# Patient Record
Sex: Male | Born: 1950 | Race: Black or African American | Hispanic: No | Smoking: Current some day smoker
Health system: Southern US, Community
[De-identification: ages and names within clinical notes are randomized; demographics above are authoritative.]

## PROBLEM LIST (undated history)

## (undated) DIAGNOSIS — I10 Essential (primary) hypertension: Secondary | ICD-10-CM

## (undated) DIAGNOSIS — I214 Non-ST elevation (NSTEMI) myocardial infarction: Secondary | ICD-10-CM

## (undated) DIAGNOSIS — E785 Hyperlipidemia, unspecified: Secondary | ICD-10-CM

## (undated) DIAGNOSIS — J449 Chronic obstructive pulmonary disease, unspecified: Secondary | ICD-10-CM

## (undated) HISTORY — DX: Essential (primary) hypertension: I10

---

## 2018-03-17 ENCOUNTER — Inpatient Hospital Stay
Admission: EM | Admit: 2018-03-17 | Discharge: 2018-03-21 | DRG: 291 | Disposition: A | Discharge status: Home / Self Care | Payer: Medicare Other | Attending: Family Medicine | Admitting: Family Medicine

## 2018-03-17 ENCOUNTER — Observation Stay: Payer: Medicare Other

## 2018-03-17 ENCOUNTER — Other Ambulatory Visit (INDEPENDENT_AMBULATORY_CARE_PROVIDER_SITE_OTHER): Payer: Self-pay

## 2018-03-17 ENCOUNTER — Emergency Department: Payer: Medicare Other

## 2018-03-17 DIAGNOSIS — I509 Heart failure, unspecified: Secondary | ICD-10-CM

## 2018-03-17 DIAGNOSIS — I472 Ventricular tachycardia: Secondary | ICD-10-CM | POA: Diagnosis present

## 2018-03-17 DIAGNOSIS — F1010 Alcohol abuse, uncomplicated: Secondary | ICD-10-CM | POA: Diagnosis present

## 2018-03-17 DIAGNOSIS — I10 Essential (primary) hypertension: Secondary | ICD-10-CM | POA: Diagnosis present

## 2018-03-17 DIAGNOSIS — M7989 Other specified soft tissue disorders: Secondary | ICD-10-CM | POA: Diagnosis present

## 2018-03-17 DIAGNOSIS — Z8249 Family history of ischemic heart disease and other diseases of the circulatory system: Secondary | ICD-10-CM

## 2018-03-17 DIAGNOSIS — F17210 Nicotine dependence, cigarettes, uncomplicated: Secondary | ICD-10-CM | POA: Diagnosis present

## 2018-03-17 DIAGNOSIS — R9439 Abnormal result of other cardiovascular function study: Secondary | ICD-10-CM | POA: Diagnosis present

## 2018-03-17 DIAGNOSIS — N189 Chronic kidney disease, unspecified: Secondary | ICD-10-CM | POA: Diagnosis present

## 2018-03-17 DIAGNOSIS — I272 Pulmonary hypertension, unspecified: Secondary | ICD-10-CM | POA: Diagnosis present

## 2018-03-17 DIAGNOSIS — I4581 Long QT syndrome: Secondary | ICD-10-CM | POA: Diagnosis present

## 2018-03-17 DIAGNOSIS — I130 Hypertensive heart and chronic kidney disease with heart failure and stage 1 through stage 4 chronic kidney disease, or unspecified chronic kidney disease: Principal | ICD-10-CM | POA: Diagnosis present

## 2018-03-17 DIAGNOSIS — D696 Thrombocytopenia, unspecified: Secondary | ICD-10-CM | POA: Diagnosis present

## 2018-03-17 DIAGNOSIS — I083 Combined rheumatic disorders of mitral, aortic and tricuspid valves: Secondary | ICD-10-CM | POA: Diagnosis present

## 2018-03-17 DIAGNOSIS — I420 Dilated cardiomyopathy: Secondary | ICD-10-CM | POA: Diagnosis present

## 2018-03-17 DIAGNOSIS — I5041 Acute combined systolic (congestive) and diastolic (congestive) heart failure: Secondary | ICD-10-CM | POA: Diagnosis present

## 2018-03-17 DIAGNOSIS — I4729 Other ventricular tachycardia: Secondary | ICD-10-CM | POA: Diagnosis present

## 2018-03-17 DIAGNOSIS — R778 Other specified abnormalities of plasma proteins: Secondary | ICD-10-CM

## 2018-03-17 DIAGNOSIS — N179 Acute kidney failure, unspecified: Secondary | ICD-10-CM | POA: Diagnosis present

## 2018-03-17 DIAGNOSIS — N289 Disorder of kidney and ureter, unspecified: Secondary | ICD-10-CM

## 2018-03-17 LAB — CBC AND DIFFERENTIAL
Absolute NRBC: 0 10*3/uL (ref 0.00–0.00)
Basophils Absolute Automated: 0.03 10*3/uL (ref 0.00–0.08)
Basophils Automated: 0.6 %
Eosinophils Absolute Automated: 0.01 10*3/uL (ref 0.00–0.44)
Eosinophils Automated: 0.2 %
Hematocrit: 49.1 % (ref 37.6–49.6)
Hgb: 15.5 g/dL (ref 12.5–17.1)
Immature Granulocytes Absolute: 0.01 10*3/uL (ref 0.00–0.07)
Immature Granulocytes: 0.2 %
Lymphocytes Absolute Automated: 1.69 10*3/uL (ref 0.42–3.22)
Lymphocytes Automated: 36.2 %
MCH: 29.4 pg (ref 25.1–33.5)
MCHC: 31.6 g/dL (ref 31.5–35.8)
MCV: 93.2 fL (ref 78.0–96.0)
MPV: 14 fL — ABNORMAL HIGH (ref 8.9–12.5)
Monocytes Absolute Automated: 0.28 10*3/uL (ref 0.21–0.85)
Monocytes: 6 %
Neutrophils Absolute: 2.65 10*3/uL (ref 1.10–6.33)
Neutrophils: 56.8 %
Nucleated RBC: 0 /100 WBC (ref 0.0–0.0)
Platelets: 112 10*3/uL — ABNORMAL LOW (ref 142–346)
RBC: 5.27 10*6/uL (ref 4.20–5.90)
RDW: 14 % (ref 11–15)
WBC: 4.67 10*3/uL (ref 3.10–9.50)

## 2018-03-17 LAB — BLOOD GAS, ARTERIAL
Arterial Total CO2: 34.3 mEq/L — ABNORMAL HIGH (ref 24.0–30.0)
Base Excess, Arterial: -5.6 mEq/L — ABNORMAL LOW (ref <=2.0)
HCO3, Arterial: 17.3 mEq/L — ABNORMAL LOW (ref 23.0–29.0)
O2 Flow: 2 L/min
O2 Sat, Arterial: 98.2 % (ref 95.0–100.0)
Temperature: 37
pCO2, Arterial: 27.2 mmHg — ABNORMAL LOW (ref 35.0–45.0)
pH, Arterial: 7.413 (ref 7.350–7.450)
pO2, Arterial: 111 mmHg — ABNORMAL HIGH (ref 80.0–90.0)

## 2018-03-17 LAB — TROPONIN I
Troponin I: 0.14 ng/mL — ABNORMAL HIGH (ref 0.00–0.09)
Troponin I: 0.15 ng/mL — ABNORMAL HIGH (ref 0.00–0.09)

## 2018-03-17 LAB — COMPREHENSIVE METABOLIC PANEL
ALT: 39 U/L (ref 0–55)
AST (SGOT): 68 U/L — ABNORMAL HIGH (ref 5–34)
Albumin/Globulin Ratio: 1 (ref 0.9–2.2)
Albumin: 3.9 g/dL (ref 3.5–5.0)
Alkaline Phosphatase: 65 U/L (ref 38–106)
Anion Gap: 14 (ref 5.0–15.0)
BUN: 31 mg/dL — ABNORMAL HIGH (ref 9.0–28.0)
Bilirubin, Total: 2.1 mg/dL — ABNORMAL HIGH (ref 0.2–1.2)
CO2: 18 mEq/L — ABNORMAL LOW (ref 22–29)
Calcium: 9.7 mg/dL (ref 8.5–10.5)
Chloride: 107 mEq/L (ref 100–111)
Creatinine: 2.1 mg/dL — ABNORMAL HIGH (ref 0.7–1.3)
Globulin: 4.1 g/dL — ABNORMAL HIGH (ref 2.0–3.6)
Glucose: 110 mg/dL — ABNORMAL HIGH (ref 70–100)
Potassium: 4.6 mEq/L (ref 3.5–5.1)
Protein, Total: 8 g/dL (ref 6.0–8.3)
Sodium: 139 mEq/L (ref 136–145)

## 2018-03-17 LAB — HEMOLYSIS INDEX: Hemolysis Index: 16 (ref 0–18)

## 2018-03-17 LAB — BILIRUBIN, DIRECT: Bilirubin Direct: 1.5 mg/dL — ABNORMAL HIGH (ref 0.0–0.5)

## 2018-03-17 LAB — B-TYPE NATRIURETIC PEPTIDE: B-Natriuretic Peptide: 7289 pg/mL — ABNORMAL HIGH (ref 0–100)

## 2018-03-17 LAB — GFR: EGFR: 38.3

## 2018-03-17 MED ORDER — FUROSEMIDE 10 MG/ML IJ SOLN
40.00 mg | Freq: Once | INTRAMUSCULAR | Status: AC
Start: 2018-03-17 — End: 2018-03-17
  Administered 2018-03-17: 20:00:00 40 mg via INTRAVENOUS
  Filled 2018-03-17: qty 4

## 2018-03-17 MED ORDER — FUROSEMIDE 10 MG/ML IJ SOLN
40.00 mg | Freq: Once | INTRAMUSCULAR | Status: DC
Start: 2018-03-17 — End: 2018-03-17

## 2018-03-17 MED ORDER — TAB-A-VITE/BETA CAROTENE PO TABS
1.00 | ORAL_TABLET | Freq: Every day | ORAL | Status: DC
Start: 2018-03-17 — End: 2018-03-21
  Administered 2018-03-17 – 2018-03-21 (×5): 1 via ORAL
  Filled 2018-03-17 (×5): qty 1

## 2018-03-17 MED ORDER — FUROSEMIDE 10 MG/ML IJ SOLN
40.00 mg | Freq: Two times a day (BID) | INTRAMUSCULAR | Status: DC
Start: 2018-03-18 — End: 2018-03-21
  Administered 2018-03-18 – 2018-03-21 (×7): 40 mg via INTRAVENOUS
  Filled 2018-03-17 (×7): qty 4

## 2018-03-17 MED ORDER — THIAMINE (VITAMIN B1) 100 MG PO TABS (WRAP)
100.00 mg | ORAL_TABLET | Freq: Every day | ORAL | Status: DC
Start: 2018-03-17 — End: 2018-03-21
  Administered 2018-03-17 – 2018-03-21 (×5): 100 mg via ORAL
  Filled 2018-03-17 (×5): qty 1

## 2018-03-17 MED ORDER — ONDANSETRON HCL 4 MG/2ML IJ SOLN
4.00 mg | Freq: Once | INTRAMUSCULAR | Status: DC | PRN
Start: 2018-03-17 — End: 2018-03-17

## 2018-03-17 MED ORDER — FOLIC ACID 1 MG PO TABS
1.00 mg | ORAL_TABLET | Freq: Every day | ORAL | Status: DC
Start: 2018-03-17 — End: 2018-03-21
  Administered 2018-03-17 – 2018-03-21 (×5): 1 mg via ORAL
  Filled 2018-03-17 (×5): qty 1

## 2018-03-17 MED ORDER — ASPIRIN 325 MG PO TABS
325.00 mg | ORAL_TABLET | Freq: Once | ORAL | Status: AC
Start: 2018-03-17 — End: 2018-03-17
  Administered 2018-03-17: 19:00:00 325 mg via ORAL
  Filled 2018-03-17: qty 1

## 2018-03-17 MED ORDER — HEPARIN SODIUM (PORCINE) 5000 UNIT/ML IJ SOLN
5000.00 [IU] | Freq: Two times a day (BID) | INTRAMUSCULAR | Status: DC
Start: 2018-03-17 — End: 2018-03-21
  Administered 2018-03-17 – 2018-03-21 (×7): 5000 [IU] via SUBCUTANEOUS
  Filled 2018-03-17 (×7): qty 1

## 2018-03-17 MED ORDER — ACETAMINOPHEN 325 MG PO TABS
650.00 mg | ORAL_TABLET | Freq: Once | ORAL | Status: DC | PRN
Start: 2018-03-17 — End: 2018-03-21

## 2018-03-17 NOTE — H&P (Signed)
[facility]      Patient: Chad Rubio  Date: 03/17/2018   DOB: July 15, 1951  Admission Date: 03/17/2018   MRN: [id number]  Attending: Marya Amsler         Chief Complaint   Patient presents with   . Shortness of Breath      History Gathered From: Patient and ED physician.     HISTORY AND PHYSICAL     ABB GOBERT is a 67 y.o. male with medical history of hypertension, former smoker (quit 4 months ago), alcohol abuse (drinks 1 pint on weekend) who presented with shortness of breath.      Patient states that he is having shortness of breath since June, worsened overtime, now on exertion as well as on rest, unable to lay down flat, associated with cough, generalized fatigue,  inability to perform daily activities and left leg swelling.  He denies chest pain, palpitation, cold/flulike symptoms, nausea, vomiting, change in urinary/bowel habit.    Past Medical History:   Diagnosis Date   . Hypertension      Past surgical history:  Patient denies significant past surgical history.    Prior to Admission medications    Medication Sig Start Date End Date Taking? Authorizing Provider   lisinopril (PRINIVIL,ZESTRIL) 20 MG tablet Take 20 mg by mouth 03/05/13  Yes [provider]       No Known Allergies    CODE STATUS: Full.  I discussed code status with the patient.  He wants to be full code.    PRIMARY CARE MD: Pcp, None, MD    Family history:  Grandmother: Congestive heart failure.  Patient denies family history of coronary artery disease.    Social History   Substance Use Topics   . Smoking status: Former Smoker-Quit 4 months ago.     . Smokeless tobacco: Never Used   . Alcohol use 1 pint on weekends.         REVIEW OF SYSTEMS   Positive for: As in HPI.  Negative for: As in HPI.  All ROS completed and otherwise negative.    PHYSICAL EXAM     Vital Signs (most recent): BP (!) 148/104   Pulse 94   Temp 97 F (36.1 C)   Resp 22   Ht 1.905 m (6\' 3" )   Wt 80.7 kg (178 lb)   SpO2 95%   BMI 22.25 kg/m    Constitutional: NAD. Patient speaks freely in full sentences.   HEENT: NC/AT, no scleral icterus or conjunctival pallor, no nasal discharge, MMM.  Neck: trachea midline, supple.  Cardiovascular: RRR, normal S1 S2, no murmurs, gallops or palpable thrills. + JVD. + LLE swelling.  Respiratory: Normal rate. No retractions or increased work of breathing. Crackles b/l. No wheezing.   Gastrointestinal: +BS, non-distended, soft, non-tender.  Genitourinary: no suprapubic tenderness.  Musculoskeletal: ROM and motor strength grossly normal.   Skin exam:  Normal.  Neurologic: No gross motor or sensory deficits  Psychiatric: AAOx3, affect and mood appropriate. The patient is alert, interactive, appropriate.  Capillary refill:  Normal.    Exam done by Marya Amsler, MD on 03/17/18 at 7:45 PM      LABS & IMAGING     Recent Results (from the past 24 hour(s))   CBC with differential    Collection Time: 03/17/18  5:00 PM   Result Value Ref Range    WBC 4.67 3.10 - 9.50 x10 3/uL    Hgb 15.5 12.5 - 17.1 g/dL  Hematocrit 49.1 37.6 - 49.6 %    Platelets 112 (L) 142 - 346 x10 3/uL    RBC 5.27 4.20 - 5.90 x10 6/uL    MCV 93.2 78.0 - 96.0 fL    MCH 29.4 25.1 - 33.5 pg    MCHC 31.6 31.5 - 35.8 g/dL    RDW 14 11 - 15 %    MPV 14.0 (H) 8.9 - 12.5 fL    Neutrophils 56.8 None %    Lymphocytes Automated 36.2 None %    Monocytes 6.0 None %    Eosinophils Automated 0.2 None %    Basophils Automated 0.6 None %    Immature Granulocyte 0.2 None %    Nucleated RBC 0.0 0.0 - 0.0 /100 WBC    Neutrophils Absolute 2.65 1.10 - 6.33 x10 3/uL    Abs Lymph Automated 1.69 0.42 - 3.22 x10 3/uL    Abs Mono Automated 0.28 0.21 - 0.85 x10 3/uL    Abs Eos Automated 0.01 0.00 - 0.44 x10 3/uL    Absolute Baso Automated 0.03 0.00 - 0.08 x10 3/uL    Absolute Immature Granulocyte 0.01 0.00 - 0.07 x10 3/uL    Absolute NRBC 0.00 0.00 - 0.00 x10 3/uL   Comprehensive metabolic panel    Collection Time: 03/17/18  5:00 PM   Result Value Ref Range    Glucose 110 (H) 70 - 100  mg/dL    BUN 65.7 (H) 9.0 - 84.6 mg/dL    Creatinine 2.1 (H) 0.7 - 1.3 mg/dL    Sodium 962 952 - 841 mEq/L    Potassium 4.6 3.5 - 5.1 mEq/L    Chloride 107 100 - 111 mEq/L    CO2 18 (L) 22 - 29 mEq/L    Calcium 9.7 8.5 - 10.5 mg/dL    Protein, Total 8.0 6.0 - 8.3 g/dL    Albumin 3.9 3.5 - 5.0 g/dL    AST (SGOT) 68 (H) 5 - 34 U/L    ALT 39 0 - 55 U/L    Alkaline Phosphatase 65 38 - 106 U/L    Bilirubin, Total 2.1 (H) 0.2 - 1.2 mg/dL    Globulin 4.1 (H) 2.0 - 3.6 g/dL    Albumin/Globulin Ratio 1.0 0.9 - 2.2    Anion Gap 14.0 5.0 - 15.0   Troponin I    Collection Time: 03/17/18  5:00 PM   Result Value Ref Range    Troponin I 0.14 (HH) 0.00 - 0.09 ng/mL   Hemolysis index    Collection Time: 03/17/18  5:00 PM   Result Value Ref Range    Hemolysis Index 16 0 - 18   GFR    Collection Time: 03/17/18  5:00 PM   Result Value Ref Range    EGFR 38.3    B-type Natriuretic Peptide    Collection Time: 03/17/18  5:00 PM   Result Value Ref Range    B-Natriuretic Peptide 7,289 (H) 0 - 100 pg/mL   Bilirubin, direct    Collection Time: 03/17/18  5:00 PM   Result Value Ref Range    Bilirubin, Direct 1.5 (H) 0.0 - 0.5 mg/dL   Arterial Blood Gas (ABG)    Collection Time: 03/17/18  5:20 PM   Result Value Ref Range    pH, Arterial 7.413 7.350 - 7.450    pCO2, Arterial 27.2 (L) 35.0 - 45.0 mmHg    pO2, Arterial 111.0 (H) 80.0 - 90.0 mmHg    HCO3, Arterial 17.3 (L) 23.0 -  29.0 mEq/L    Arterial Total CO2 34.3 (H) 24.0 - 30.0 mEq/L    Base Excess, Arterial -5.6 (L) -2.0 - 2.0 mEq/L    O2 Sat, Arterial 98.2 95.0 - 100.0 %    ABG CollectionSite Left Radl     Allen's Test Yes     Temperature 37.0     FIO2 na %    O2 Delivery Nasal Cannula     O2 Flow 2.0 L/min       MICROBIOLOGY:  Blood Culture: NA  Urine Culture: NA  Antibiotics Started: N    IMAGING:  Upon my review:   1.  Chest x-ray showing cardiomegaly with bilateral pleural effusion.    CARDIAC:  EKG Interpretation (upon my review):  Normal sinus rhythm.  T-wave inversion in lateral leads.   Prolonged QTC.    Markers:    Recent Labs  Lab 03/17/18  1700   Troponin I 0.14*       EMERGENCY DEPARTMENT COURSE:  Orders Placed This Encounter   Procedures   . Chest AP Portable   . [place] VenoDopp Low Extremity Bilateral   . CBC with differential   . Comprehensive metabolic panel   . Troponin I   . Hemolysis index   . GFR   . B-type Natriuretic Peptide   . Arterial Blood Gas (ABG)   . Bilirubin, direct   . Basic Metabolic Panel   . CBC   . Magnesium   . Vital signs with Pulse Ox (per unit protocol)   . Progressive Mobility Protocol   . Notify physician   . Activate Heart Failure Clinical Pathway   . Intake and Output   . Height   . Weight   . Weigh patient   . Skin assessment   . Place sequential compression device   . Maintain sequential compression device   . Telemetry 24 Hour Protocol   . Full Code   . ED Unit Sec Comm Order   . Cardiac rehab-phase I-inpatient referral   . ECG 12 Lead   . Saline lock IV   . Place (admit) for Observation Services   . [facility] ED Bed Request (Observation)       ASSESSMENT & PLAN     CASEN PRYOR is a 67 y.o. male with medical history of hypertension, former smoker (quit 4 months ago), alcohol abuse (drinks 1 pint on weekend) admitted with new onset congestive heart failure.      Patient Active Hospital Problem List: 03/17/18    1. New onset CHF: (SOB-rest and exertional, fatigue, orthopnea, JVD, + CXR, BNP 7289)  2. HTN  3. ARF: Cr 2.1 POA. Unsure baseline.  4. Abnormal EKG  5. Binge drinking: 1 pint on weekend  6. LLE swelling    -Reviewed CXR; showing cardiomegaly with bilateral pleural effusion.  -Reviewed EKG: Normal sinus rhythm.  T-wave inversion in lateral leads.  Prolonged QTC.  -ASA given in ED.  -IV Lasix with recheck electrolytes and renal functions. Strict I&Os, daily weight, water restriction, low salt diet and cardiac rehab on discharge.  -Echocardiogram.  -Consider cardiology consult.  -TSH and Mg.  -Holding Lisinopril. Nephrology consulted, will follow  recommendations. Will avoid nephrotoxins.   -Telemetry monitoring.  -Will trend troponin.   -Will avoid meds which increase QTc.   -Alcohol cessation education. Multivitamins. Last drink was on weekend.  -[place] LLE to r/o DVT    Nutrition  2gms Na diet with fluid restriction.    DVT/VTE Prophylaxis  SCD/Heparin.    Anticipated medical stability for discharge: 1-2 days.    Service status/Reason for ongoing hospitalization: Observation/CHF new onset  Anticipated Discharge Needs: To be determined.    Signed,  Marya Amsler    Time Elapsed: 45 min.

## 2018-03-17 NOTE — ED Notes (Addendum)
[facility] ED NURSING NOTE FOR THE RECEIVING INPATIENT NURSE   ED NURSE Marcelino Scot [phone]   ED CHARGE RN [phone]   ADMISSION INFORMATION   Chad Rubio is a 67 y.o. male admitted with a diagnosis of:    1. Congestive heart failure, unspecified HF chronicity, unspecified heart failure type    2. Elevated troponin    3. Renal insufficiency       Isolation None   Holding Orders confirmed? Yes   Belongings Documented? Yes   Home medications sent to pharmacy confirmed? Yes   NURSING CARE   Mental Status   alert and oriented   ADLs ADL   Independent with all ADLs    Ambulation no difficulty   Pertinent Information  and Safety Concerns Going to doppler before coming to floor.      CT / NIH   CT Head ordered on this patient?  No   NIH/Dysphagia assessment done prior to admission? No    VITAL SIGNS     Time of Last Set of Vitals:    1945 Temperature 97    BP 121/89    Heart Rate 98    Respirations 27    Pulse Ox 98   IV LINES   IV Catheter Size: 22 g L hand  Peripheral IV 03/17/18 Left Hand (Active)   Site Assessment Clean;Intact;Dry 03/17/2018  5:25 PM   Line Status Saline Locked 03/17/2018  5:25 PM   Dressing Status Clean;Dry;Intact 03/17/2018  5:25 PM   Number of days: 0          LAB RESULTS   Labs Reviewed   CBC AND DIFFERENTIAL - Abnormal; Notable for the following:        Result Value    Platelets 112 (*)     MPV 14.0 (*)     All other components within normal limits   COMPREHENSIVE METABOLIC PANEL - Abnormal; Notable for the following:     Glucose 110 (*)     BUN 31.0 (*)     Creatinine 2.1 (*)     CO2 18 (*)     AST (SGOT) 68 (*)     Bilirubin, Total 2.1 (*)     Globulin 4.1 (*)     All other components within normal limits   TROPONIN I - Abnormal; Notable for the following:     Troponin I 0.14 (*)     All other components within normal limits   B-TYPE NATRIURETIC PEPTIDE - Abnormal; Notable for the following:     B-Natriuretic Peptide 7,289 (*)     All other components within normal limits   BLOOD GAS, ARTERIAL -  Abnormal; Notable for the following:     pCO2, Arterial 27.2 (*)     pO2, Arterial 111.0 (*)     HCO3, Arterial 17.3 (*)     Arterial Total CO2 34.3 (*)     Base Excess, Arterial -5.6 (*)     All other components within normal limits   BILIRUBIN, DIRECT - Abnormal; Notable for the following:     Bilirubin, Direct 1.5 (*)     All other components within normal limits   HEMOLYSIS INDEX   GFR          (11/2017)

## 2018-03-17 NOTE — ED Triage Notes (Signed)
Chad Rubio is a 67 y.o. male c/o worsening SOB since June. Says physical,l activity makes it worse. Pt also states he has HTN but does not take medicine at hiome

## 2018-03-17 NOTE — ED Provider Notes (Signed)
EMERGENCY DEPARTMENT HISTORY AND PHYSICAL EXAM     Physician/Midlevel provider first contact with patient: 03/17/18 1659         Date: 03/17/2018  Patient Name: Chad Rubio    History of Presenting Illness     Chief Complaint   Patient presents with   . Shortness of Breath   . Cough       History Provided By: Patient    Chief Complaint: SOB  Duration: since June  Timing:  Intermittent  Location: resp/CV  Quality: uncomfortable  Severity: Severe  Exacerbating factors: worse with walking for long periods of time  Alleviating factors: none  Associated Symptoms: fatigue, SOB, weakness, leg swelling  Pertinent Negatives: none    Additional History: Chad Rubio is a 67 y.o. male presenting to the ED with intermittent "panic attacks" that he states he has had since June. When he experiences the panic attacks he feels SOB that is worse with walking for long periods of time. He reports since his panic attacks began, he has also felt fatigue and generalized weakness. Pt also c/o bilateral leg swelling. Pt has not seen a pulmonologist or cardiologist in the past; does not have a PCP. He reports he used to smoke 1 ppd, but since his symptoms began, 1 pack lasts him 2-3 days.      PCP: Pcp, None, MD  SPECIALISTS:    Current Facility-Administered Medications   Medication Dose Route Frequency Provider Last Rate Last Dose   . heparin (porcine) injection 5,000 Units  5,000 Units Subcutaneous Q12H [facility] Marya Amsler, MD         Current Outpatient Prescriptions   Medication Sig Dispense Refill   . lisinopril (PRINIVIL,ZESTRIL) 20 MG tablet Take 20 mg by mouth         Past History     Past Medical History:  Past Medical History:   Diagnosis Date   . Hypertension        Past Surgical History:  History reviewed. No pertinent surgical history.    Family History:  History reviewed. No pertinent family history.    Social History:  Social History   Substance Use Topics   . Smoking status: Former Games developer   . Smokeless tobacco: Never  Used   . Alcohol use No       Allergies:  No Known Allergies    Review of Systems     Review of Systems   Constitutional: Positive for fatigue. Negative for activity change, chills and fever.   HENT: Negative for congestion and sore throat.    Eyes: Negative for pain and redness.   Respiratory: Positive for shortness of breath. Negative for cough.    Cardiovascular: Positive for leg swelling. Negative for chest pain and palpitations.   Gastrointestinal: Negative for abdominal pain, diarrhea, nausea and vomiting.   Genitourinary: Negative for dysuria and hematuria.   Musculoskeletal: Negative for arthralgias and myalgias.   Skin: Negative for pallor and rash.   Neurological: Positive for weakness. Negative for light-headedness and headaches.   Psychiatric/Behavioral: Negative for suicidal ideas. The patient is not nervous/anxious.         Positive for panic attacks.    All other systems reviewed and are negative.    Physical Exam   BP (!) 162/110   Pulse 98   Temp 97 F (36.1 C)   Resp (!) 27   Ht 6\' 3"  (1.905 m)   Wt 80.7 kg   SpO2 98%   BMI 22.25 kg/m  Physical Exam   Constitutional: He is oriented to person, place, and time. No distress.   Thin appearing.    HENT:   Head: Normocephalic and atraumatic.   Mouth/Throat: Oropharynx is clear and moist.   Eyes: Pupils are equal, round, and reactive to light. Conjunctivae and EOM are normal.   Neck: Normal range of motion. Neck supple.   Cardiovascular: Normal rate and regular rhythm.    +JVD   Pulmonary/Chest: Effort normal and breath sounds normal.   Abdominal: Soft. There is no tenderness.   Musculoskeletal: Normal range of motion.   Bilateral LE edema.    Neurological: He is alert and oriented to person, place, and time.   Skin: Skin is warm and dry.   Clubbing of fingertips.    Psychiatric: He has a normal mood and affect.        Diagnostic Study Results     Labs -     Results     Procedure Component Value Units Date/Time    B-type Natriuretic Peptide  [[id number]]  (Abnormal) Collected:  03/17/18 1700    Specimen:  Blood Updated:  03/17/18 1832     B-Natriuretic Peptide 7,289 (H) pg/mL     Bilirubin, direct [[id number]]  (Abnormal) Collected:  03/17/18 1700     Updated:  03/17/18 1806     Bilirubin, Direct 1.5 (H) mg/dL     CBC with differential [[id number]]  (Abnormal) Collected:  03/17/18 1700    Specimen:  Blood from Blood Updated:  03/17/18 1755     WBC 4.67 x10 3/uL      Hgb 15.5 g/dL      Hematocrit 08.6 %      Platelets 112 (L) x10 3/uL      RBC 5.27 x10 6/uL      MCV 93.2 fL      MCH 29.4 pg      MCHC 31.6 g/dL      RDW 14 %      MPV 14.0 (H) fL      Neutrophils 56.8 %      Lymphocytes Automated 36.2 %      Monocytes 6.0 %      Eosinophils Automated 0.2 %      Basophils Automated 0.6 %      Immature Granulocyte 0.2 %      Nucleated RBC 0.0 /100 WBC      Neutrophils Absolute 2.65 x10 3/uL      Abs Lymph Automated 1.69 x10 3/uL      Abs Mono Automated 0.28 x10 3/uL      Abs Eos Automated 0.01 x10 3/uL      Absolute Baso Automated 0.03 x10 3/uL      Absolute Immature Granulocyte 0.01 x10 3/uL      Absolute NRBC 0.00 x10 3/uL     Troponin I [[id number]]  (Abnormal) Collected:  03/17/18 1700    Specimen:  Blood Updated:  03/17/18 1755     Troponin I 0.14 (HH) ng/mL     Comprehensive metabolic panel [[id number]]  (Abnormal) Collected:  03/17/18 1700    Specimen:  Blood Updated:  03/17/18 1745     Glucose 110 (H) mg/dL      BUN 01.0 (H) mg/dL      Creatinine 2.1 (H) mg/dL      Sodium 272 mEq/L      Potassium 4.6 mEq/L      Chloride 107 mEq/L      CO2 18 (L) mEq/L  Calcium 9.7 mg/dL      Protein, Total 8.0 g/dL      Albumin 3.9 g/dL      AST (SGOT) 68 (H) U/L      ALT 39 U/L      Alkaline Phosphatase 65 U/L      Bilirubin, Total 2.1 (H) mg/dL      Globulin 4.1 (H) g/dL      Albumin/Globulin Ratio 1.0     Anion Gap 14.0    Hemolysis index [[id number]] Collected:  03/17/18 1700     Updated:  03/17/18 1745     Hemolysis Index 16    GFR [[id number]] Collected:  03/17/18  1700     Updated:  03/17/18 1745     EGFR 38.3    Arterial Blood Gas (ABG) [[id number]]  (Abnormal) Collected:  03/17/18 1720    Specimen:  Blood, Arterial Updated:  03/17/18 1730     pH, Arterial 7.413     pCO2, Arterial 27.2 (L) mmHg      pO2, Arterial 111.0 (H) mmHg      HCO3, Arterial 17.3 (L) mEq/L      Arterial Total CO2 34.3 (H) mEq/L      Base Excess, Arterial -5.6 (L) mEq/L      O2 Sat, Arterial 98.2 %      ABG CollectionSite Left Radl     Allen's Test Yes     Temperature 37.0     FIO2 na %      O2 Delivery Nasal Cannula     O2 Flow 2.0 L/min           Radiologic Studies -   Radiology Results (24 Hour)     Procedure Component Value Units Date/Time    Chest AP Portable [[id number]] Collected:  03/17/18 1712    Order Status:  Completed Updated:  03/17/18 1721    Narrative:       HISTORY: Acute anterior chest pain, shortness of breath.    COMPARISON: None available at dictation.    FINDINGS: Single portable AP view the chest was performed. There is  cardiomegaly with bilateral pleural effusions. There is mild increase in  interstitial markings bilaterally. The upper lobe pulmonary veins are  distended. There is tortuous aorta.    No pneumothorax or consolidation is seen. There is focal volume loss in  the lower lung fields. Trachea is judged unremarkable.      Impression:         1. Cardiomegaly with low-grade CHF. There is mild hypoventilation in the  lung bases.    Charlene Brooke, MD   03/17/2018 5:17 PM      .    Medical Decision Making   I am the first provider for this patient.    I reviewed the vital signs, available nursing notes, past medical history, past surgical history, family history and social history.    Vital Signs-Reviewed the patient's vital signs.     Patient Vitals for the past 12 hrs:   BP Temp Pulse Resp   03/17/18 1945 (!) 162/110 - 98 -   03/17/18 1944 - - - (!) 27   03/17/18 1831 (!) 148/104 - 94 22   03/17/18 1726 - - 93 21   03/17/18 1609 (!) 140/94 97 F (36.1 C) (!) 101 22        Pulse Oximetry Analysis - Abnormal 93% on nasal cannula    Cardiac Monitor:  Rate: 100  Rhythm:  Normal Sinus Rhythm  EKG:  Interpreted by the EP.   Time Interpreted: 1600   Rate: 100   Rhythm: Normal Sinus Rhythm    Interpretation: Normal axis, prolonged QT interval otherwise normal intervals, no ST elevations or depressions, T wave inversions in V5,V6   Comparison: No prior study is available for comparison.    Old Medical Records: Old medical records.     ED Course:   ED Course as of Mar 17 1952   Tue Mar 17, 2018   1756 Troponin I: (!!) 0.14 [MA]   1837 B-Natriuretic Peptide: Maylon Peppers [MA]   1943 Called to bedside by [facility] MD to eval pulses. Equal DP pulses palpated. Will order DVT study to r/o DVT  [MA]      ED Course User Index  [MA] Taima Rada, Rochel Brome, MD     4:59 PM - Assessed pt and discussed plan of care in ED.     6:56 PM - Updated pt, pt agrees with plan for admission.     7:28 PM - Discussed case with Dr. Greig Castilla, [facility], who accepts pt for admission.     7:39 PM - Reassessed pt with Dr. Greig Castilla at bedside.     Provider Notes: sob, worse with exertion c/f ACS vs CHF vs COPD. Also t/c malignancy due to smoking.   -ekg  -cxr  -labs  -likely admit    For Hospitalized Patients:    1. Hospitalization Decision Time:  The decision to admit this patient was made by the emergency provider at 7:30 PM on 03/17/2018     2. Aspirin: Aspirin was given    3. Core Measures: NA    Diagnosis     Clinical Impression:   1. Congestive heart failure, unspecified HF chronicity, unspecified heart failure type    2. Elevated troponin    3. Renal insufficiency        Treatment Plan:   ED Disposition     ED Disposition Condition Date/Time Comment    Observation  Tue Mar 17, 2018  7:30 PM Admitting Physician: Marya Amsler [[id number]]   Diagnosis: Congestive heart failure, unspecified HF chronicity, unspecified heart failure type [[id number]]   Estimated Length of Stay: < 2 midnights   Tentative Discharge Plan?: Home or Self Care  [1]   Patient Class: Observation [104]              _______________________________      Attestations: This note is prepared by Renda Rolls, acting as scribe for Freda Jackson, MD.    Freda Jackson, MD - The scribe's documentation has been prepared under my direction and personally reviewed by me in its entirety.  I confirm that the note above accurately reflects all work, treatment, procedures, and medical decision making performed by me.    _______________________________       Darcus Pester, MD  03/17/18 [phone]

## 2018-03-18 ENCOUNTER — Observation Stay: Payer: Medicare Other

## 2018-03-18 ENCOUNTER — Inpatient Hospital Stay: Payer: Medicare Other

## 2018-03-18 ENCOUNTER — Other Ambulatory Visit (INDEPENDENT_AMBULATORY_CARE_PROVIDER_SITE_OTHER): Payer: Self-pay

## 2018-03-18 ENCOUNTER — Ambulatory Visit (INDEPENDENT_AMBULATORY_CARE_PROVIDER_SITE_OTHER): Payer: Self-pay

## 2018-03-18 LAB — CBC
Absolute NRBC: 0 10*3/uL (ref 0.00–0.00)
Hematocrit: 46.6 % (ref 37.6–49.6)
Hgb: 14.6 g/dL (ref 12.5–17.1)
MCH: 28.9 pg (ref 25.1–33.5)
MCHC: 31.3 g/dL — ABNORMAL LOW (ref 31.5–35.8)
MCV: 92.3 fL (ref 78.0–96.0)
MPV: 13.6 fL — ABNORMAL HIGH (ref 8.9–12.5)
Nucleated RBC: 0 /100 WBC (ref 0.0–0.0)
Platelets: 107 10*3/uL — ABNORMAL LOW (ref 142–346)
RBC: 5.05 10*6/uL (ref 4.20–5.90)
RDW: 14 % (ref 11–15)
WBC: 5.24 10*3/uL (ref 3.10–9.50)

## 2018-03-18 LAB — BASIC METABOLIC PANEL
Anion Gap: 15 (ref 5.0–15.0)
BUN: 33 mg/dL — ABNORMAL HIGH (ref 9.0–28.0)
CO2: 18 mEq/L — ABNORMAL LOW (ref 22–29)
Calcium: 9.4 mg/dL (ref 8.5–10.5)
Chloride: 106 mEq/L (ref 100–111)
Creatinine: 2 mg/dL — ABNORMAL HIGH (ref 0.7–1.3)
Glucose: 114 mg/dL — ABNORMAL HIGH (ref 70–100)
Potassium: 5.1 mEq/L (ref 3.5–5.1)
Sodium: 139 mEq/L (ref 136–145)

## 2018-03-18 LAB — URINALYSIS WITH MICROSCOPIC
Bilirubin, UA: NEGATIVE
Blood, UA: NEGATIVE
Glucose, UA: NEGATIVE
Ketones UA: NEGATIVE
Nitrite, UA: NEGATIVE
Protein, UR: NEGATIVE
Specific Gravity UA: 1.008 (ref 1.001–1.035)
Urine pH: 6 (ref 5.0–8.0)
Urobilinogen, UA: NEGATIVE mg/dL

## 2018-03-18 LAB — MAGNESIUM: Magnesium: 1.9 mg/dL (ref 1.6–2.6)

## 2018-03-18 LAB — HEMOLYSIS INDEX: Hemolysis Index: 16 (ref 0–18)

## 2018-03-18 LAB — TROPONIN I: Troponin I: 0.13 ng/mL — ABNORMAL HIGH (ref 0.00–0.09)

## 2018-03-18 LAB — GFR: EGFR: 40.5

## 2018-03-18 MED ORDER — GUAIFENESIN-DM 100-10 MG/5ML PO SYRP
5.00 mL | ORAL_SOLUTION | ORAL | Status: DC | PRN
Start: 2018-03-18 — End: 2018-03-21
  Administered 2018-03-18 – 2018-03-20 (×5): 5 mL via ORAL
  Filled 2018-03-18 (×5): qty 5

## 2018-03-18 MED ORDER — MAGNESIUM SULFATE IN D5W 1-5 GM/100ML-% IV SOLN
1.00 g | Freq: Once | INTRAVENOUS | Status: AC
Start: 2018-03-18 — End: 2018-03-18
  Administered 2018-03-18: 17:00:00 1 g via INTRAVENOUS
  Filled 2018-03-18: qty 100

## 2018-03-18 NOTE — Progress Notes (Signed)
[facility]  PROGRESS NOTE      Patient: Chad Rubio  Date: 03/18/2018   LOS: 0 Days  Admission Date: 03/17/2018   MRN: [id number]  Attending: [name]  Please contact me on the following pager [phone]       ASSESSMENT/PLAN     Chad Rubio is a 67 y.o. male admitted with acute congestive heart failure    Interval Summary:     Patient Active Hospital Problem List:  1. New onset CHF: (SOB-rest and exertional, fatigue, orthopnea, JVD, + CXR, BNP 7289)  2. HTN  3. ARF: Cr 2.1 POA. Unsure baseline.  4. Abnormal EKG  5. Binge drinking: 1 pint on weekend  6. LLE swelling  7.  NSVT      -Reviewed CXR; showing cardiomegaly with bilateral pleural effusion.  -Reviewed EKG: Normal sinus rhythm.  T-wave inversion in lateral leads.  Prolonged QTC.  -Status post cardiology evaluation.  Follow-up with echocardiogram result  -Continue with IV Lasix with recheck electrolytes and renal functions. Strict I&Os, daily weight, water restriction, low salt diet and cardiac rehab on discharge.  -Nephrology on board  -Follow-up with TSH  -Begin beta-blocker and ACE if LV function depressed on echo as per cardiology recommendation  -Lexiscan during the hospitalization for ischemic evaluation as per cardiology  -Alcohol cessation education. Multivitamins. Last drink was on weekend.      Nutrition  2gms Na diet with fluid restriction.    DVT/VTE Prophylaxis  SCD/Heparin.               Code Status: Full    DISPO: To be determined             SUBJECTIVE     Chad Rubio states that he feels much better however still short of breath when ambulating    MEDICATIONS     Current Facility-Administered Medications   Medication Dose Route Frequency   . folic acid  1 mg Oral Daily   . furosemide  40 mg Intravenous BID   . heparin (porcine)  5,000 Units Subcutaneous Q12H SCH   . multivitamin  1 tablet Oral Daily   . thiamine  100 mg Oral Daily       PHYSICAL EXAM     Vitals:    03/18/18 1608   BP: (!) 136/93   Pulse: 96   Resp: 20    Temp: (!) 96.2 F (35.7 C)   SpO2: 97%       Temperature: Temp  Min: 96.2 F (35.7 C)  Max: 97.3 F (36.3 C)  Pulse: Pulse  Min: 95  Max: 98  Respiratory: Resp  Min: 18  Max: 29  Non-Invasive BP: BP  Min: 121/89  Max: 162/110  Pulse Oximetry SpO2  Min: 95 %  Max: 99 %    Intake and Output Summary (Last 24 hours) at Date Time    Intake/Output Summary (Last 24 hours) at 03/18/18 1854  Last data filed at 03/18/18 1800   Gross per 24 hour   Intake              810 ml   Output             3025 ml   Net            -2215 ml         GEN APPEARANCE: Normal;  A&OX3  HEENT: PERLA; EOMI; Conjunctiva Clear  NECK: Supple; No bruits  CVS: RRR, S1, S2; No  M/G/R  LUNGS: CTAB; No Wheezes; No Rhonchi: Crackles at bilateral base  ABD: Soft; No TTP; + Normoactive BS  EXT: 2+ edema; Pulses 2+ and intact  Skin exam:  no pallor  NEURO: CN 2-12 intact; No Focal neurological deficits  CAP REFILL:  Normal  MENTAL STATUS:  Normal          LABS       Recent Labs  Lab 03/18/18  0231 03/17/18  1700   WBC 5.24 4.67   RBC 5.05 5.27   Hgb 14.6 15.5   Hematocrit 46.6 49.1   MCV 92.3 93.2   Platelets 107* 112*         Recent Labs  Lab 03/18/18  0231 03/17/18  1700   Sodium 139 139   Potassium 5.1 4.6   Chloride 106 107   CO2 18* 18*   BUN 33.0* 31.0*   Creatinine 2.0* 2.1*   Glucose 114* 110*   Calcium 9.4 9.7   Magnesium 1.9  --          Recent Labs  Lab 03/17/18  1700   ALT 39   AST (SGOT) 68*   Bilirubin, Total 2.1*   Bilirubin, Direct 1.5*   Albumin 3.9   Alkaline Phosphatase 65         Recent Labs  Lab 03/18/18  0231 03/17/18  2303 03/17/18  1700   Troponin I 0.13* 0.15* 0.14*             Microbiology Results     None           RADIOLOGY     Chest Ap Portable    Result Date: 03/17/2018  1. Cardiomegaly with low-grade CHF. There is mild hypoventilation in the lung bases. Charlene Brooke, MD 03/17/2018 5:17 PM    [place] Venodopp Low Extremity Bilateral    Result Date: 03/18/2018      Normal venous duplex of the lower extremities.  No evidence of  intraluminal thrombus or obstruction to venous flow in right or left lower extremity. Denna Haggard, MD 03/18/2018 8:57 AM      Signed,  [name]  6:54 PM 03/18/2018

## 2018-03-18 NOTE — Consults (Addendum)
CONSULTATION    Date Time: 03/18/18 5:24 PM  Patient Name: Chad Rubio  Requesting Physician: [name], MD      Reason for Consultation:   Acute kidney injury  Assessment:   Acute kidney injury versus chronic kidney disease.  Has been having poor oral intake.  Has history of hypertension and admits taking nonsteroidal anti-inflammatory drugs in the past.  However, no known renal function recently.  Hypertension, blood pressure is fair.  Not on any medication at present.  Chest pain/dyspnea, cardiology is following.  Thrombocytopenia  Plan:   Has been started on furosemide.  Check urine analysis and renal ultrasound.  Follow the echocardiogram result.  Follow the urine output.  Monitor the blood pressure and hemoglobin.  Avoid hypotension and nephrotoxic.  Discussed with the patient in detail.  History:   Chad Rubio is a 67 y.o. male who presents to the hospital on 03/17/2018 with Chest pain and shortness of breath.  Patient has been started on furosemide.  Admits feeling better.  Patient is alert and awake but not a good historian.  He has not been following with any medical doctor.  Admits having a history of hypertension but not on any medication.  As per patient, he does not remember having any blood work.  He denies any other significant medical problem.  He admits taking nonsteroidal anti-inflammatory drugs in the past, but did not take any recently.  He drinks alcohol on the weekend but denies any recently.  He has history of smoking but denies any drugs.  He denies any family history of kidney disease.    Past Medical History:     Past Medical History:   Diagnosis Date   . Hypertension        Past Surgical History:   History reviewed. No pertinent surgical history.    Family History:   History reviewed. No pertinent family history.    Social History:     Social History     Social History   . Marital status: Divorced     Spouse name: N/A   . Number of children: N/A   . Years of  education: N/A     Social History Main Topics   . Smoking status: Former Games developer   . Smokeless tobacco: Never Used   . Alcohol use No   . Drug use: No   . Sexual activity: Not on file     Other Topics Concern   . Not on file     Social History Narrative   . No narrative on file       Allergies:   No Known Allergies    Medications:     Current Facility-Administered Medications   Medication Dose Route Frequency   . folic acid  1 mg Oral Daily   . furosemide  40 mg Intravenous BID   . heparin (porcine)  5,000 Units Subcutaneous Q12H SCH   . magnesium sulfate  1 g Intravenous Once   . multivitamin  1 tablet Oral Daily   . thiamine  100 mg Oral Daily       Review of Systems:   No fever, chills  No cough, sputum  Came in with chest pain and shortness of breath  No abd pain, nausea or vomiting  No urinary symptoms,   No joint symptoms  No skin rash  No headache, visual changes,   All other systems reviewed and negative for new problems  Physical Exam:   BP (!) 136/93  Pulse 96   Temp (!) 96.2 F (35.7 C) (Temporal Artery)   Resp 20   Ht 1.905 m (6\' 3" )   Wt 80.7 kg (178 lb)   SpO2 97%   BMI 22.25 kg/m     Intake/Output Summary (Last 24 hours) at 03/18/18 1724  Last data filed at 03/18/18 1400   Gross per 24 hour   Intake              690 ml   Output             2175 ml   Net            -1485 ml       General: awake, alert, oriented x 3; no acute distress.  HEENT pallor,   Mucous membranes moist,   Neck - JVP not raised  Chest -Bilateral air entry   Heart - S1, S2,   Abdomen - soft, nontender, nondistended,   Extremities: no edema    Labs Reviewed:     Recent Labs      03/18/18   0231  03/17/18   1700   WBC  5.24  4.67   Hgb  14.6  15.5   Hematocrit  46.6  49.1   Platelets  107*  112*       Recent Labs      03/18/18   0231  03/17/18   1700   Sodium  139  139   Potassium  5.1  4.6   Chloride  106  107   CO2  18*  18*   BUN  33.0*  31.0*   Creatinine  2.0*  2.1*   Glucose  114*  110*   Calcium  9.4  9.7   Magnesium   1.9   --        Recent Labs      03/17/18   1700   AST (SGOT)  68*   ALT  39   Alkaline Phosphatase  65   Protein, Total  8.0   Albumin  3.9       No results for input(s): PTT, PT, INR in the last 72 hours.    Rads:     Radiology Results (24 Hour)     Procedure Component Value Units Date/Time    [place] VenoDopp Low Extremity Bilateral [[id number]] Collected:  03/18/18 0857    Order Status:  Completed Updated:  03/18/18 0901    Narrative:       EXAMINATION: Venous Duplex Bilateral Lower Extremities    CLINICAL HISTORY: 67 year old male with bilateral lower extremity pain  and swelling    TECHNIQUE:  Duplex evaluation of the veins of the lower extremities is  performed from the lower pelvis to the upper calves bilaterally with  gray scale imaging, transverse compression and gated and color Doppler  techniques.  Additional calf vein evaluation is performed bilaterally to  the ankle.    INTERPRETATION:   Examination of the deep venous system of right and  left lower extremities demonstrates no evidence of intraluminal thrombus  or obstruction to venous flow.  Normal phasicity is present at the  iliofemoral junctions indicating no central obstruction.  There is  normal coaptation of the femoropopliteal veins throughout their course  with transverse compression.  The saphenofemoral junction and greater  saphenous are also noted to be widely patent bilaterally.  Deep and  superficial veins of the calves are also demonstrated to be patent  without thrombus or obstruction.  Impression:           Normal venous duplex of the lower extremities.  No  evidence of intraluminal thrombus or obstruction to venous flow in right  or left lower extremity.    Denna Haggard, MD   03/18/2018 8:57 AM          Mosie Lukes, MD  03/18/2018  5:24 PM  [phone]

## 2018-03-18 NOTE — Consults (Addendum)
[place] [facility] CONSULTATION REPORT  [facility]    Date Time: 03/18/18 1:01 PMPatient Name: Chad Rubio  Requesting Physician: [name], MD       Reason for Consultation:   Congestive heart failure      History:   Chad Rubio is a 67 y.o. male with history of hypertension but no other known heart disease admitted from the emergency room where he presented last night complaining of a 2 to 90-month history of progressive weakness, shortness of breath, decreased appetite and what he describes as "panic attacks" which he means he experiences sudden onset of shortness of breath.  Mr. Daily reports a 5 to 7 pound weight loss over the period of time he has recently noted some edema in his lower extremities.  Test x-ray in the emergency room was consistent with [facility].BNP level on admission was elevated to 7289 serial troponins are minimally elevated to 0.15.  On 03/17/2018.  We have been asked by [name], MD,  to provide cardiac consultation, regarding further evaluation and management.    [name] denies chest pain is no history of prior myocardial infarction.  He has not previously been diagnosed with congestive heart failure.  Running risk factors for heart disease there is a history of medically treated hypertension.  He reports having been told he has "borderline" diabetes.  There is no known history of hyperlipidemia.  Cade has smoked 1 pack of cigarettes per day since he was a teenager.  He admits to drinking mostly on weekends up to 1/5 of alcohol on weekend days.  He says that he has not had anything to any alcohol to drink since the onset of his current symptoms in early June.  No cough.  Reports a family history of congestive heart failure.      Past Medical History:     Past Medical History:   Diagnosis Date   . Hypertension        Past Surgical History:   History reviewed. No pertinent surgical history.    Family History:   History reviewed. No pertinent family  history.    Social History:     Social History     Social History   . Marital status: Divorced     Spouse name: N/A   . Number of children: N/A   . Years of education: N/A     Social History Main Topics   . Smoking status: Former Games developer   . Smokeless tobacco: Never Used   . Alcohol use No   . Drug use: No   . Sexual activity: Not on file     Other Topics Concern   . Not on file     Social History Narrative   . No narrative on file       Allergies:   No Known Allergies    Medications:     Prescriptions Prior to Admission   Medication Sig   . albuterol (PROVENTIL HFA;VENTOLIN HFA) 108 (90 Base) MCG/ACT inhaler Inhale 2 puffs into the lungs every 6 (six) hours as needed for Wheezing   . fluticasone (FLONASE) 50 MCG/ACT nasal spray 1 spray by Nasal route daily       Current Facility-Administered Medications   Medication Dose Route Frequency Provider Last Rate Last Dose   . acetaminophen (TYLENOL) tablet 650 mg  650 mg Oral Once PRN Ahmed, Rochel Brome, MD       . folic acid (FOLVITE) tablet 1 mg  1  mg Oral Daily Marya Amsler, MD   1 mg at 03/18/18 0955   . furosemide (LASIX) injection 40 mg  40 mg Intravenous BID Marya Amsler, MD   40 mg at 03/18/18 0754   . heparin (porcine) injection 5,000 Units  5,000 Units Subcutaneous Q12H [facility] Marya Amsler, MD   5,000 Units at 03/18/18 0955   . multivitamin tablet 1 tablet  1 tablet Oral Daily Marya Amsler, MD   1 tablet at 03/18/18 0955   . thiamine (VITAMIN B1) tablet 100 mg  100 mg Oral Daily Marya Amsler, MD   100 mg at 03/18/18 1610         Review of Systems:    Comprehensive review of systems including constitutional, eyes, ears, nose, mouth, throat, cardiovascular, GI, GU, musculoskeletal, integumentary, respiratory, neurologic, psychiatric, and endocrine is negative other than what is mentioned already in the history of present illness    Physical Exam:     Vitals:    03/18/18 1211   BP: (!) 134/101   Pulse: 96   Resp: (!) 26   Temp: (!) 96.8 F (36 C)   SpO2: 99%      Temp (24hrs), Avg:97.1 F (36.2 C), Min:96.8 F (36 C), Max:97.3 F (36.3 C)      Intake and Output Summary (Last 24 hours) at Date Time    Intake/Output Summary (Last 24 hours) at 03/18/18 1301  Last data filed at 03/18/18 1002   Gross per 24 hour   Intake              490 ml   Output             1575 ml   Net            -1085 ml      GENERAL: Patient is in no acute distress.  He is resting comfortably in bed in the ICU in the PCU.  HEENT: No scleral icterus or conjunctival pallor, moist mucous membranes   NECK: Jugular venous distention to 12 cm.  There are no carotid bruits.  There is no palpable thyromegaly.  CARDIAC: Regular rhythm.  S1 normal intensity S2 splits normally with respirations.  There is an apical S4 there is no S3 gallop.  There are no murmurs clicks or rubs.  CHEST: Diminished breath sounds at the bases posteriorly.  Lung fields otherwise are clear with no rales rhonchi or wheezes.  ABDOMEN: No abdominal bruits, masses, or hepatosplenomegaly, nontender, non-distended, good bowel sounds   EXTREMITIES: 1+ pitting edema of the lower extremities bilaterally to halfway between the knees and the ankles.  SKIN: No rash or jaundice   NEUROLOGIC: Alert and oriented to time, place and person, normal mood and affect  MUSCULOSKELETAL: Normal muscle strength and tone.      Labs Reviewed:       Recent Labs  Lab 03/18/18  0231 03/17/18  2303 03/17/18  1700   Troponin I 0.13* 0.15* 0.14*               Recent Labs  Lab 03/17/18  1700   Bilirubin, Total 2.1*   Bilirubin, Direct 1.5*   Protein, Total 8.0   Albumin 3.9   ALT 39   AST (SGOT) 68*       Recent Labs  Lab 03/18/18  0231   Magnesium 1.9           Recent Labs  Lab 03/18/18  0231 03/17/18  1700   WBC 5.24 4.67  Hgb 14.6 15.5   Hematocrit 46.6 49.1   Platelets 107* 112*       Recent Labs  Lab 03/18/18  0231 03/17/18  1700   Sodium 139 139   Potassium 5.1 4.6   Chloride 106 107   CO2 18* 18*   BUN 33.0* 31.0*   Creatinine 2.0* 2.1*   EGFR 40.5 38.3    Glucose 114* 110*   Calcium 9.4 9.7     EKG today. shows sinus rhythm rate 99 bpm, left atrial enlargement, LVH with strain, possible old inferior wall myocardial infarction and PACs.  Inferior Q waves which are present on today's EKG were not noted on the EKG on admission yesterday.    Review of telemetry reveals sinus rhythm with a 20 beat run of rapid nonsustained vtach today.     Radiology   Radiological Procedure reviewed.      chest X-ray 03/17/2018:  HISTORY: Acute anterior chest pain, shortness of breath.    COMPARISON: None available at dictation.    FINDINGS: Single portable AP view the chest was performed. There is  cardiomegaly with bilateral pleural effusions. There is mild increase in  interstitial markings bilaterally. The upper lobe pulmonary veins are  distended. There is tortuous aorta.    No pneumothorax or consolidation is seen. There is focal volume loss in  the lower lung fields. Trachea is judged unremarkable.    IMPRESSION:     1. Cardiomegaly with low-grade CHF. There is mild hypoventilation in the  lung bases.    Charlene Brooke, MD   03/17/2018 5:17 PM  Assessment:    Acute congestive heart failure   Non sustained v tach   Renal insufficiency   History hypertension   History alcoholism ('s reports self-reports are none since June)   Ongoing tobacco use    Recommendations:    Review echocardiogram apparently done earlier today but not yet reported   Continue to diuresis with intravenous furosemide   Keep magnesium > 2 (currently 1.9).Chad Kitchen  Dose of IV mag sulfate ordered.    Check thyroid functions   Begin beta-blocker and ACE if LV function depressed on echo   Lexiscan nuclear study to exclude underlying ischemic disease.     Thank you very much for asking [place] to see Mr. Arna Medici caught in consultation.  We will follow with you.            Signed by: Montey Hora, MD    [facility]  NP [facility] [phone] (8am-5pm)  MD Philis Kendall  [phone])  After  hours, non urgent consult line [phone]  After Hours, urgent consults [phone]

## 2018-03-18 NOTE — Plan of Care (Signed)
Problem: Safety  Goal: Patient will be free from injury during hospitalization  Outcome: Progressing   03/18/18 1000   Goal/Interventions addressed this shift   Patient will be free from injury during hospitalization  Include patient/ family/ care giver in decisions related to safety;Ensure appropriate safety devices are available at the bedside;Use appropriate transfer methods;Assess patient's risk for falls and implement fall prevention plan of care per policy;Provide and maintain safe environment;Hourly rounding       Problem: Renal Instability  Goal: Fluid and electrolyte balance are achieved/maintained  Outcome: Progressing   03/18/18 1000   Goal/Interventions addressed this shift   Fluid and electrolyte balance are achieved/maintained  Monitor daily weight;Monitor intake and output every shift;Provide adequate hydration;Assess and reassess fluid and electrolyte status;Follow fluid restrictions/IV/PO parameters;Observe for cardiac arrhythmias;Assess for confusion/personality changes;Monitor/assess lab values and report abnormal values;Monitor for muscle weakness   Today's plan of care monitor vitals and cardiac rhythm, safety, cardiology consulted, monitor intake and output, iv lasix, ambulation discussed with Patient.

## 2018-03-18 NOTE — Progress Notes (Signed)
Nutritional Support Services  Nutrition Assessment    Chad Rubio 67 y.o. male   MRN: [id number]    Summary of Nutrition Recommendations:    Nutrition recommendation - Encouraged pt to continue eating 75-100% to meet estimated needs for wt maintenance. If intake decreases again, recommended that he drink a protein supplement as meal replacement. Provided cardiac diet education including low/high sodium food choices, how to read a nutrition label for sodium and fluid restriction of 40 oz. daily per MD order.     -----------------------------------------------------------------------------------------------------------------                                                        Assessment Data:   Referral Source: CHF diet edu  Reason for Referral: Medicaid pt CHF     Nutrition: cardiac diet    Learning Needs: CHF diet edu    Hospital Admission: Pt was admitted through the ED c/o SOB, poor appetite and 5-7 pt wt loss over 2-3 months, now with CHF dx.     Medical Hx:  has a past medical history of Hypertension.    PSH: has no past surgical history on file.     Orders Placed This Encounter   Procedures   . Diet low sodium 2 GM NA; 1200 ML FLUID   . Diet NPO time specified Except for: SIPS WITH MEDS     Intake: eating 100% now, reports appetite has improved    ANTHROPOMETRIC  Anthropometrics  Height: 190.5 cm (6\' 3" )  Weight: 80.7 kg (178 lb)  Weight Change: 0  IBW/kg (Calculated) Male: 89.13 kg  IBW/kg (Calculated) Male: 79.51 kg  BMI (calculated): 22.3    Weight Monitoring 03/17/2018   Height 190.5 cm   Height Method Stated   Weight 80.74 kg   Weight Method Stated   BMI (calculated) 22.3 kg/m2     Weight History Summary: pt reports usual wt of 190 lb indicating 12 lb wt loss in 2-3 months (6.3% change.)     Physical Assessment:   Head:slightly dark circles, somewhat hollow look in orbital region, slight depression temple region, pt with multiple dental caries  Upper Body: slightly depressed interosseous muscle,  visible clavicle   Lower Body: didn't observe  Edema: 1+ edema LLE per MD, non pitting edema per RN flowsheets  Skin: WDL per RN flowsheets  GI function: WDL, last BM 8/20 per flowsheets    ESTIMATED NEEDS    Total Daily Energy Needs: 2017.5 to 2421 kcal  Method for Calculating Energy Needs: 25 kcal - 30 kcal per kg  at 80.7 kg (Actual body weight)  Rationale: Adult with normal BMI    Total Daily Protein Needs:     81-97 gm pro (1-1.2 g/kg)  Rationale: Older Adult, CHF     Total Daily Fluid Needs: 2018 mL fluids  Method for Calculating Fluid Needs: 25 mL/kcal or per MD     Pertinent Medications: folic acid, lasix, Mg, MVI, thiamine    IVF:  NA    No Known Allergies    Pertinent labs: BUN/Cr 33/2  Nutrition Diagnosis       Unintentional weight loss related to poor appetite and increased needs d/t CHF as evidenced by wt loss 12 lb in 2-3 months, and muscle and fat wasting as evidenced by orbital, temple, clavicle bone regions and interosseous muscle.     Food and nutrition-related knowledge deficit related to previous education regarding cardiac diet as evidenced by pt report and comments about only not using table salt.                                                              Intervention     Nutrition recommendation - Encouraged pt to continue eating 75-100% to meet estimated needs for wt maintenance. If intake decreases again, recommended that he drink a protein supplement as meal replacement. Provided cardiac diet education including low/high sodium food choices, how to read a nutrition label for sodium and fluid restriction of 40 oz. daily per MD order.     Goals:   1.) Maintain 75-100% intake of cardiac diet.   2.) Pt will list at least 2 ways to lower sodium in diet.                                                              Monitoring     Will continue to monitor po intake, labs, wt and diet education understanding during stay.                                                          Evaluation     Nutrition Risk Level: Moderate (will follow up at least 1 time per week and PRN)     Racheal Patches, RDN  Clinical Dietitian  x [phone]

## 2018-03-18 NOTE — Plan of Care (Addendum)
Problem: Safety  Goal: Patient will be free from injury during hospitalization  Outcome: Progressing   03/18/18 0352   Goal/Interventions addressed this shift   Patient will be free from injury during hospitalization  Assess patient's risk for falls and implement fall prevention plan of care per policy;Ensure appropriate safety devices are available at the bedside;Use appropriate transfer methods;Provide and maintain safe environment;Include patient/ family/ care giver in decisions related to safety;Hourly rounding;Provide alternative method of communication if needed (communication boards, writing)   Pt is A&Ox4, lungs sounds clear, diminished. on RA. Non productive, dry cough. DOE, orthopneic, clubbing finger tips. Left lower extremity non-pitting edema. Denies pain at this time. Reports a desire to stop drinking alcohol beverages and smoking tobacco. Steady gait. Standby assist, fall precaution in place. Call light in reach, bed in the lowest position locked. Strict I&O, daily weight. Pt reports the last primary care doctor visit was 2 years ago, due to a financial problem. Pt verbalized understanding of disease process, tx plan, and medication. Safety maintained, purposeful hourly rounding.    Problem: Day of Admission - Heart Failure  Goal: Heart Failure Admission  Outcome: Progressing   03/18/18 0352   Goal/Interventions addressed this shift   Heart Failure Admission Standing Weight on admission, if unable to stand zero the bed and use the bed scale;Strict Intake/Output;Fluid restriction;Initiate education with patient and caregiver using CHF Warning Zones and Educational Videos ([employer] or [employer]);Assess for swelling/edema and document;Oxygen as needed;Vital signs and telemetry per policy   Pt reports SOB with exertion, clear diminished lung sounds, on room air, orthopnea, reports non-productive, dry cough, clubbing finger tips. Left lower extremity non-pitting edema. Denies pain in left lower  extremity. Trend troponin x3, troponin peak at 0.15. Denies chest pain or pressure. Denies nausea or vomiting. Strict I&O, daily standing weight, 1200 mL fluid restriction.     Problem: Renal Instability  Goal: Fluid and electrolyte balance are achieved/maintained  Outcome: Progressing   03/18/18 0352   Goal/Interventions addressed this shift   Fluid and electrolyte balance are achieved/maintained  Monitor intake and output every shift;Monitor/assess lab values and report abnormal values;Assess for confusion/personality changes;Monitor daily weight;Assess and reassess fluid and electrolyte status;Observe for seizure activity and initiate seizure precautions if indicated;Provide adequate hydration;Monitor for muscle weakness;Observe for cardiac arrhythmias;Follow fluid restrictions/IV/PO parameters   Monitor renal function, pt produce adequate urine output. Strict I&O.

## 2018-03-18 NOTE — Progress Notes (Signed)
Per Tele room, Patient had 20 beats of V-Tach, Patient asymptomatic, vitals stable, no complained of chest pain. Patient complained of panic attack x 1, according to him this is not new for him, at happens at home sometimes. Dr Letitia Neri( Heart Dr) aware about this episode. Dr is with the Patient assessing at this time. Continue to monitor.

## 2018-03-18 NOTE — UM Notes (Addendum)
[employer]   03/18/18 1417  Admit to Inpatient Once   03/17/18 1930  Place (admit) for Observation Services (Adult Observation Admit Panel (AX)) Once     ED Presentation  Chief Complaint  Patient presents with  . Shortness of Breath  . Cough    ARLANDER GILLEN is a 67 y.o. male presenting to the ED with intermittent "panic attacks" that he states he has had since June. When he experiences the panic attacks he feels SOB that is worse with walking for long periods of time. He reports since his panic attacks began, he has also felt fatigue and generalized weakness. Pt also c/o bilateral leg swelling. Pt has not seen a pulmonologist or cardiologist in the past; does not have a PCP. He reports he used to smoke 1 ppd, but since his symptoms began, 1 pack lasts him 2-3 days.      H&P  LUISENRIQUE CONRAN is a 67 y.o. male with medical history of hypertension, former smoker (quit 4 months ago), alcohol abuse (drinks 1 pint on weekend) who presented with shortness of breath.   Patient states that he is having shortness of breath since June, worsened overtime, now on exertion as well as on rest, unable to lay down flat, associated with cough, generalized fatigue,  inability to perform daily activities and left leg swelling.      Respiratory: Crackles b/l.  Cardiovascular: RRR,  + JVD. + LLE swelling    Temp:  [97 F (36.1 C)-97.3 F (36.3 C)] 97 F (36.1 C)  Heart Rate:  [93-101] 98  Resp Rate:  [18-29] 18  BP: (121-162)/(74-110) 147/109     Lab Results last 48 Hours     Procedure Component Value Units Date/Time    Troponin I [[id number]]  (Abnormal) Collected:  03/18/18 0231    Specimen:  Blood Updated:  03/18/18 0358     Troponin I 0.13 (H) ng/mL     Basic Metabolic Panel [[id number]]  (Abnormal) Collected:  03/18/18 0231    Specimen:  Blood Updated:  03/18/18 0351     Glucose 114 (H) mg/dL      BUN 32.4 (H) mg/dL      Creatinine 2.0 (H) mg/dL      CO2 18 (L) mEq/L      Anion Gap 15.0    Magnesium [[id number]]  Collected:  03/18/18 0231    Specimen:  Blood Updated:  03/18/18 0351     Magnesium 1.9 mg/dL     Hemolysis index [[id number]] Collected:  03/18/18 0231     Updated:  03/18/18 0351     Hemolysis Index 16    GFR [[id number]] Collected:  03/18/18 0231     Updated:  03/18/18 0351     EGFR 40.5    CBC [[id number]]  (Abnormal) Collected:  03/18/18 0231    Specimen:  Blood from Blood Updated:  03/18/18 0335     Platelets 107 (L) x10 3/uL      MCHC 31.3 (L) g/dL      RDW 14 %      MPV 13.6 (H) fL     Troponin I [[id number]]  (Abnormal) Collected:  03/17/18 2303    Specimen:  Blood Updated:  03/17/18 2359     Troponin I 0.15 (H) ng/mL     Troponin I [[id number]]  (Abnormal) Collected:  03/17/18 1700    Specimen:  Blood Updated:  03/17/18 2053     Troponin I 0.14 (H) ng/mL  B-type Natriuretic Peptide [[id number]]  (Abnormal) Collected:  03/17/18 1700    Specimen:  Blood Updated:  03/17/18 1832     B-Natriuretic Peptide 7,289 (H) pg/mL     Bilirubin, direct [[id number]]  (Abnormal) Collected:  03/17/18 1700     Updated:  03/17/18 1806     Bilirubin, Direct 1.5 (H) mg/dL     CBC with differential [[id number]]  (Abnormal) Collected:  03/17/18 1700    Specimen:  Blood from Blood Updated:  03/17/18 1755     Platelets 112 (L) x10 3/uL      MPV 14.0 (H) fL     Comprehensive metabolic panel [[id number]]  (Abnormal) Collected:  03/17/18 1700    Specimen:  Blood Updated:  03/17/18 1745     Glucose 110 (H) mg/dL      BUN 61.6 (H) mg/dL      Creatinine 2.1 (H) mg/dL      CO2 18 (L) mEq/L      AST (SGOT) 68 (H) U/L      Bilirubin, Total 2.1 (H) mg/dL      Globulin 4.1 (H) g/dL     Hemolysis index [[id number]] Collected:  03/17/18 1700     Updated:  03/17/18 1745     Hemolysis Index 16    GFR [[id number]] Collected:  03/17/18 1700     Updated:  03/17/18 1745     EGFR 38.3    Arterial Blood Gas (ABG) [[id number]]  (Abnormal) Collected:  03/17/18 1720    Specimen:  Blood, Arterial Updated:  03/17/18 1730     pH, Arterial 7.413     pCO2, Arterial  27.2 (L) mmHg      pO2, Arterial 111.0 (H) mmHg      HCO3, Arterial 17.3 (L) mEq/L      Arterial Total CO2 34.3 (H) mEq/L      Base Excess, Arterial -5.6 (L) mEq/L      O2 Sat, Arterial 98.2 %      ABG CollectionSite Left Radl     Allen's Test Yes     Temperature 37.0     FIO2 na %      O2 Delivery Nasal Cannula     O2 Flow 2.0 L/min         Chest AP Portable [[id number]]  Impression:      1. Cardiomegaly with low-grade CHF. There is mild hypoventilation in the  lung bases.    Cardiac Monitor:  Rate: 100  Rhythm:  Normal Sinus Rhythm     EKG:  Interpreted by the EP.              Time Interpreted: 1600              Rate: 100              Rhythm: Normal Sinus Rhythm               Interpretation: Normal axis, prolonged QT interval otherwise normal intervals, no ST elevations or depressions, T wave inversions in V5,V6    Completed Meds   aspirin tablet 325 mg : Dose 325 mg : Oral : Once  furosemide (LASIX) injection 40 mg : Dose 40 mg : Intravenous : Once     Scheduled Meds:  Current Facility-Administered Medications  Medication Dose Route Frequency  . folic acid  1 mg Oral Daily  . furosemide  40 mg Intravenous BID  . heparin (porcine)  5,000 Units Subcutaneous Q12H SCH  . multivitamin  1 tablet Oral  Daily  . thiamine  100 mg Oral Daily    PRN Meds:.acetaminophen    ASSESSMENT & PLAN  TOUSSAINT GOLSON is a 67 y.o. male with medical history of hypertension, former smoker (quit 4 months ago), alcohol abuse (drinks 1 pint on weekend) admitted with new onset congestive heart failure.      Patient Active Hospital Problem List: 03/17/18    1. New onset CHF: (SOB-rest and exertional, fatigue, orthopnea, JVD, + CXR, BNP 7289)  2. HTN  3. ARF: Cr 2.1 POA. Unsure baseline.  4. Abnormal EKG  5. Binge drinking: 1 pint on weekend  6. LLE swelling    -Reviewed CXR; showing cardiomegaly with bilateral pleural effusion.  -Reviewed EKG: Normal sinus rhythm.  T-wave inversion in lateral leads.  Prolonged QTC.  -ASA given  in ED.  -IV Lasix with recheck electrolytes and renal functions. Strict I&Os, daily weight, water restriction, low salt diet and cardiac rehab on discharge.  -Echocardiogram.  -Consider cardiology consult.  -TSH and Mg.  -Holding Lisinopril. Nephrology consulted, will follow recommendations. Will avoid nephrotoxins.   -Telemetry monitoring.  -Will trend troponin.   -Will avoid meds which increase QTc.   -Alcohol cessation education. Multivitamins. Last drink was on weekend.  -[place] LLE to r/o DVT    Nutrition  2gms Na diet with fluid restriction.    DVT/VTE Prophylaxis  SCD/Heparin.    Johnathan Hausen, RN, MSN  [employer] Case Management  [phone] (F)

## 2018-03-19 ENCOUNTER — Inpatient Hospital Stay: Payer: Medicare Other

## 2018-03-19 LAB — ECG 12-LEAD
Atrial Rate: 100 {beats}/min
Atrial Rate: 99 {beats}/min
P Axis: 64 degrees
P Axis: 49 degrees
P-R Interval: 156 ms
P-R Interval: 154 ms
Q-T Interval: 388 ms
Q-T Interval: 388 ms
QRS Duration: 104 ms
QRS Duration: 102 ms
QTC Calculation (Bezet): 500 ms
QTC Calculation (Bezet): 497 ms
R Axis: 15 degrees
R Axis: -21 degrees
T Axis: 77 degrees
T Axis: 76 degrees
Ventricular Rate: 100 {beats}/min
Ventricular Rate: 99 {beats}/min

## 2018-03-19 MED ORDER — PNEUMOCOCCAL 13-VAL CONJ VACC IM SUSP
0.50 mL | INTRAMUSCULAR | Status: DC | PRN
Start: 2018-03-19 — End: 2018-03-21
  Filled 2018-03-19 (×2): qty 0.5

## 2018-03-19 MED ORDER — REGADENOSON 0.4 MG/5ML IV SOLN
INTRAVENOUS | Status: AC
Start: 2018-03-19 — End: 2018-03-19
  Administered 2018-03-19: 12:00:00 0.4 mg
  Filled 2018-03-19: qty 5

## 2018-03-19 MED ORDER — TECHNETIUM TC 99M TETROFOSMIN IV KIT
11.00 | PACK | Freq: Once | INTRAVENOUS | Status: AC | PRN
Start: 2018-03-19 — End: 2018-03-19
  Administered 2018-03-19: 11:00:00 11 via INTRAVENOUS
  Filled 2018-03-19: qty 100

## 2018-03-19 MED ORDER — HYDRALAZINE HCL 10 MG PO TABS
10.00 mg | ORAL_TABLET | Freq: Three times a day (TID) | ORAL | Status: DC
Start: 2018-03-19 — End: 2018-03-21
  Administered 2018-03-19 – 2018-03-21 (×5): 10 mg via ORAL
  Filled 2018-03-19 (×6): qty 1

## 2018-03-19 MED ORDER — ISOSORBIDE MONONITRATE ER 30 MG PO TB24
30.00 mg | ORAL_TABLET | Freq: Every day | ORAL | Status: DC
Start: 2018-03-19 — End: 2018-03-21
  Administered 2018-03-19 – 2018-03-21 (×3): 30 mg via ORAL
  Filled 2018-03-19 (×3): qty 1

## 2018-03-19 MED ORDER — TECHNETIUM TC 99M TETROFOSMIN IV KIT
30.00 | PACK | Freq: Once | INTRAVENOUS | Status: AC | PRN
Start: 2018-03-19 — End: 2018-03-19
  Administered 2018-03-19: 13:00:00 30 via INTRAVENOUS
  Filled 2018-03-19: qty 100

## 2018-03-19 MED ORDER — CARVEDILOL 3.125 MG PO TABS
3.1250 mg | ORAL_TABLET | Freq: Two times a day (BID) | ORAL | Status: DC
Start: 2018-03-19 — End: 2018-03-20
  Administered 2018-03-19 – 2018-03-20 (×2): 3.125 mg via ORAL
  Filled 2018-03-19 (×2): qty 1

## 2018-03-19 NOTE — Progress Notes (Signed)
Abnormal stress test . NPO post midnight for possible cardiac cath am. Rounds with Dr Ambrose Pancoast and plan of care discussed with patient.

## 2018-03-19 NOTE — Progress Notes (Signed)
Patient back from stress test.

## 2018-03-19 NOTE — Plan of Care (Signed)
Problem: Safety  Goal: Patient will be free from injury during hospitalization  Outcome: Progressing   03/19/18 0553   Goal/Interventions addressed this shift   Patient will be free from injury during hospitalization  Assess patient's risk for falls and implement fall prevention plan of care per policy;Provide and maintain safe environment;Include patient/ family/ care giver in decisions related to safety;Ensure appropriate safety devices are available at the bedside;Use appropriate transfer methods;Hourly rounding;Provide alternative method of communication if needed (communication boards, writing)   Pt is A&Ox4, lung sounds clear, on room air, SOB with exertion. Dry cough controlled with robitussin DM. Clubbing finger tips. No BM during this shift. NPO after midnight. Independent care, fall precaution in place. Call light in reach, bed in the lowest position locked. Denies chest pain, nausea, vomiting. Pt verbalized understanding of disease process, tx plan, and medication. Safety maintained, purposeful hourly rounding.    Problem: Everyday - Heart Failure  Goal: Stable Vital Signs and Fluid Balance  Outcome: Progressing   03/19/18 0553   Goal/Interventions addressed this shift   Stable Vital Signs and Fluid Balance Daily Standing Weights in the morning using the same scale, after using the bathroom and before breadfast. If unable to stand, zero the bed and use the bed scale;Monitor, assess vital signs and telemetry per policy;Wean oxygen as needed if appropriate;Monitor labs and report abnormalities to physician;Strict Intake/Output;Fluid Restriction;Assess for swelling/edema   Pt reports SOB at rest and orthopnea are improved. Reports mild SOB with exertion. Denies chest pain or pressure. Dry cough controlled with robitussin DM prn. NPO after midnight for lexiscan. Strict I$O, daily weight. 1200 mL fluid restriction, low salt diet. Mild left lower extremity swelling.     Problem: Renal Instability  Goal: Fluid  and electrolyte balance are achieved/maintained  Outcome: Progressing   03/19/18 0553   Goal/Interventions addressed this shift   Fluid and electrolyte balance are achieved/maintained  Monitor intake and output every shift;Monitor/assess lab values and report abnormal values;Assess for confusion/personality changes;Assess and reassess fluid and electrolyte status;Monitor daily weight;Provide adequate hydration;Observe for seizure activity and initiate seizure precautions if indicated;Monitor for muscle weakness;Observe for cardiac arrhythmias;Follow fluid restrictions/IV/PO parameters   Adequate urine output. On IV lasix, monitor electrolytes and renal functions. S/p renal ultrasound and urine analysis. Strict I&O. Blood pressure is fair.

## 2018-03-19 NOTE — Progress Notes (Signed)
[facility] Progress Note      Date Time: 03/19/18 10:50 AM  Patient Name: Chad Rubio, Chad Rubio           Subjective:   Resting comfortably.  No shortness of breath.  No chest pain.  No palpitations.    Medications:     Current Facility-Administered Medications   Medication Dose Route Frequency Provider Last Rate Last Dose   . acetaminophen (TYLENOL) tablet 650 mg  650 mg Oral Once PRN Ahmed, Rochel Brome, MD       . folic acid (FOLVITE) tablet 1 mg  1 mg Oral Daily Jabbar, Lubna, MD   1 mg at 03/18/18 0955   . furosemide (LASIX) injection 40 mg  40 mg Intravenous BID Marya Amsler, MD   40 mg at 03/18/18 1418   . guaiFENesin-dextromethorphan (ROBITUSSIN DM) 100-10 MG/5ML syrup 5 mL  5 mL Oral Q4H PRN [name], MD   5 mL at 03/18/18 2152   . heparin (porcine) injection 5,000 Units  5,000 Units Subcutaneous Q12H [facility] Marya Amsler, MD   5,000 Units at 03/18/18 2148   . multivitamin tablet 1 tablet  1 tablet Oral Daily Marya Amsler, MD   1 tablet at 03/18/18 0955   . regadenoson (LEXISCAN) 0.4 MG/5ML injection            . thiamine (VITAMIN B1) tablet 100 mg  100 mg Oral Daily Marya Amsler, MD   100 mg at 03/18/18 0955       Physical Exam:     Vitals:    03/19/18 0825   BP: (!) 136/98   Pulse: 96   Resp: (!) 24   Temp: 97.4 F (36.3 C)   SpO2: 97%     Temp (24hrs), Avg:96.9 F (36.1 C), Min:96.2 F (35.7 C), Max:97.4 F (36.3 C)           Intake and Output Summary (Last 24 hours) at Date Time    Intake/Output Summary (Last 24 hours) at 03/19/18 1050  Last data filed at 03/19/18 0605   Gross per 24 hour   Intake              440 ml   Output             2595 ml   Net            -2155 ml       General Appearance: Resting comfortably  Head:  normocephalic  Eyes:  EOM's intact  Neck:  No carotid bruit or significant jugular venous distension, brisk carotid upstroke  Lungs:  Clear to auscultation throughout, good respiratory effort   Heart: normal S1, S2, no S3, S4, murmurs or rubs.  Non-palpable PMI.  Abdomen:   Soft, non-tender, normoactive bowel sounds  Extremities:  No cyanosis, clubbing or edema  Pulses: 2+ distal pulses  Neurologic:  Alert and oriented x3, 4-5+ strength throughout  ENT:  Unremarkable  Mood: Normal      Labs:   Cardiac Enzymes:    Recent Labs  Lab 03/18/18  0231 03/17/18  2303 03/17/18  1700   Troponin I 0.13* 0.15* 0.14*     No results for input(s): TROPONIN, ISTATTROPONI, CK in the last 24 hours.    Invalid input(s): TROPONINT, CKMB[24          Lipid profile:        Recent Labs  Lab 03/17/18  1700   Bilirubin, Total 2.1*   Bilirubin, Direct 1.5*   Protein, Total  8.0   Albumin 3.9   ALT 39   AST (SGOT) 68*     No results for input(s): CHOL, TRIG, HDL in the last 24 hours.    Invalid input(s): LDLC, VLDLC, LRAT[24      Recent Labs  Lab 03/18/18  0231   Magnesium 1.9           Recent Labs  Lab 03/18/18  0231 03/17/18  1700   WBC 5.24 4.67   Hgb 14.6 15.5   Hematocrit 46.6 49.1   Platelets 107* 112*   MCV 92.3 93.2   MCHC 31.3* 31.6   RDW 14 14       Recent Labs  Lab 03/18/18  0231 03/17/18  1700   Sodium 139 139   Potassium 5.1 4.6   Chloride 106 107   CO2 18* 18*   BUN 33.0* 31.0*   Creatinine 2.0* 2.1*   EGFR 40.5 38.3   Glucose 114* 110*   Calcium 9.4 9.7   AST (SGOT)  --  68*   ALT  --  39           Invalid input(s): FREET4        Invalid input(s): PTI, COUM, ACOAG, ACOAP  .  Lab Results   Component Value Date    BNP 7,289 (H) 03/17/2018        Recent ABG No results for input(s): TEMP, FIO2, RATE, MODE, ETCO2, PEEP in the last 24 hours.    Invalid input(s): APH, APCO2, APO2, AHCO3, ATCO2, ABE, AOSAT, ABGS, ALLEN, STATS, O2DEL, O2FLO, PRESS, VNTMN, PRSUP, TIVOL[24      Radiology:       Echocardiogram Adult Complete W Clr/ Dopp Waveform    Result Date: 03/18/2018  Name:     DONOVON MICHELETTI Age:     67 years DOB:     [birth year] Gender:     Male MRN:     [id number] Wt:     178 lb BSA:     2.06 m2 HR:     98 bpm Systolic BP:     540 mmHg Diastolic BP:     981 mmHg Technical Quality:     Good Exam Date/Time:      03/18/2018 8:22 AM Exam Type:     ECHOCARDIOGRAM ADULT COMPLETE W CLR/ DOPP WAVEFORM Staff Sonographer:     Bubba Camp,  RDCS Ordering Physician:     Elam Dutch Study Info Indications      - new onset chf Procedure   Complete two-dimensional, color flow and spectral Doppler transthoracic echocardiogram is performed. 75 in History/Risk Factors Hypertension:     Yes Hyperlipidemia:     Yes Tobacco Use:     Yes Additional Patient History   Increased SOB, CHF, increased troponin and renal failure. No prior echocardiogram history available for comparison. Summary   * The left atrium is severely dilated.   * Left ventricular ejection fraction is severely decreased with an estimated ejection fraction of <15%.   * The right atrium is severely dilated.   * The left ventricle is markedly  dilated with mild concentric left ventricular hypertophy and severe global left ventricular hypokinesis. Visually estimated LVEF is <15%.   * Left ventricular diastolic filling parameters are consistent with Grade II diastolic dysfunction (pseudonormal pattern) with elevated left atrial pressure.   * The right ventricle is dilated with  depressed right ventricular systolic function. .   * ortic valve is trileaflet with no aortic stenosis  and mild aortic insufficiency. .   * There is mild mitral regurgitation.   * There is severe tricuspid regurgitation with severe pulmonary hypertension.  Estimated right ventricular systolic pressure is 53 mmHg. .   * There is mild pulmonic regurgitation.   * Small circumferential pericardial effusion visualized which is not hemodynamically significant. .   * No thrombi, vegetations or other abnormal intracardiac masses. Findings Left Ventricle   The left ventricle is markedly  dilated with mild concentric left ventricular hypertophy and severe global left ventricular hypokinesis. Visually estimated LVEF is <15%.   Left ventricular wall thickness is mildly increased.   Left ventricular  ejection fraction is severely decreased with an estimated ejection fraction of <15%.   Left ventricular segmental wall motion is normal.   Left ventricular diastolic filling parameters are consistent with Grade II diastolic dysfunction (pseudonormal pattern) with elevated left atrial pressure. Right Ventricle   The right ventricle is dilated with  depressed right ventricular systolic function. .   Decreased right ventricular systolic function. Left Atrium   The left atrium is severely dilated. Right Atrium   The right atrium is severely dilated. Atrial Septum   No evidence of interatrial shunt by color Doppler. Aortic Valve   The aortic valve is tricuspid.   ortic valve is trileaflet with no aortic stenosis and mild aortic insufficiency. .   There is mild aortic regurgitation. Pulmonary Valve   The pulmonic valve is structurally normal.   There is mild pulmonic regurgitation. Mitral Valve   The mitral valve is structurally normal.   There is mild mitral regurgitation. Tricuspid Valve   The tricuspid valve is structurally normal.   There is severe tricuspid regurgitation with severe pulmonary hypertension. Estimated right ventricular systolic pressure is 53 mmHg. Marland Kitchen   Severe pulmonary hypertension with estimated right ventricular systolic pressure of 63 mmHg. Pericardium / Pleural Effusion   Small circumferential pericardial effusion visualized which is not hemodynamically significant. . Inferior Vena Cava   The IVC is dilated with < 50% respiratory variance consistent with significantly elevated RA pressure of 15 mmHg. Aorta   The aortic root is normal in size.   The ascending aorta is normal in size. Measurements 2D Measurements ---------------------------------------------------------------------- Name                                 Value        Normal ---------------------------------------------------------------------- Parasternal 2D ---------------------------------------------------------------------- RVID  Diastole (2D)                 4.85 cm     2.50-3.50 IVS Diastolic Thickness (2D)       1.61 cm     0.60-1.00 LVID Diastole (2D)                 6.55 cm     4.20-5.80 LVIW Diastolic Thickness (2D)                               1.06 cm     0.60-1.05 LVID Systole (2D)                  6.26 cm     2.50-4.00 LV Ejection Fraction 2D ---------------------------------------------------------------------- LV EF (2D Teichholz)                  10 %  52-72 Apical 2D Dimensions ---------------------------------------------------------------------- LA Volume Index (BP A-L)       60.29 ml/m2   16.00-34.00 M-mode Measurements ---------------------------------------------------------------------- Name                                 Value        Normal ---------------------------------------------------------------------- M-Mode ---------------------------------------------------------------------- Ao Root Diameter (MM)              3.44 cm               LA Dimension (MM)                  5.09 cm     3.00-4.10 AV Cusp Sep (MM)                   2.08 cm               TAPSE                              0.89 cm        >=1.60 LVOT/Aortic Valve Doppler Measurements ---------------------------------------------------------------------- Name                                 Value        Normal ---------------------------------------------------------------------- LVOT Doppler ---------------------------------------------------------------------- LVOT Peak Velocity                0.80 m/s               AoV Doppler ---------------------------------------------------------------------- AV Peak Velocity                  0.69 m/s               AV Peak Gradient                 1.90 mmHg               AV V1/V2 Ratio                        1.17               AoV Regurgitation Doppler ---------------------------------------------------------------------- AR PHT                              996 ms RVOT/Pulmonic Valve Doppler Measurements  ---------------------------------------------------------------------- Name                                 Value        Normal ---------------------------------------------------------------------- PV Doppler ---------------------------------------------------------------------- PV Peak Velocity                  0.61 m/s Mitral Valve Measurements ---------------------------------------------------------------------- Name                                 Value        Normal ---------------------------------------------------------------------- MV Doppler ---------------------------------------------------------------------- MV E Peak Velocity                0.74 m/s  MV A Peak Velocity                0.45 m/s               MV E/A                                1.65               MV Pressure Half Time                41 ms               MV Annular TDI ---------------------------------------------------------------------- MV Septal e' Velocity             0.03 m/s        >=0.08 MV E/e' (Septal)                     24.71        <=8.00 MV Lateral e' Velocity            0.06 m/s        >=0.10 MV E/e' (Lateral)                    12.35        <=8.00 Tricuspid Valve Measurements ---------------------------------------------------------------------- Name                                 Value        Normal ---------------------------------------------------------------------- TV Doppler ---------------------------------------------------------------------- TV E Peak Velocity                0.54 m/s               TV Regurgitation Doppler ---------------------------------------------------------------------- TR Peak Velocity                  3.46 m/s               TR Peak Gradient                   48 mmHg               RA Pressure                        15 mmHg           <=3 RV Systolic Pressure               63 mmHg           <36 Aorta / Venous Measurements  ---------------------------------------------------------------------- Name                                 Value        Normal ---------------------------------------------------------------------- IVC/SVC ---------------------------------------------------------------------- IVC Diameter (Exp 2D)              2.49 cm        <=2.10 Report Signatures Finalized [id number]  Rosenfeld on 03/18/2018 6:58:05 PM Promoted [id number]  [place] on 03/18/2018 9:32:50 AM    US Renal Kidney    Result Date: 03/18/2018  RENAL ULTRASOUND CLINICAL STATEMENT: Chronic renal failure. COMPARISON: No prior studies are available for comparison. TECHNIQUE: A sonogram of the kidneys was performed utilizing gray-scale  appearance and color Doppler. FINDINGS: RIGHT KIDNEY: The right kidney is normal in echogenicity  measuring 10.8  cm in longitudinal dimension. No right hydronephrosis, mass or calculi are visualized. LEFT KIDNEY: The left kidney is normal in echogenicity measuring 10.9 cm in longitudinal dimension. No left hydronephrosis, mass or calculi are visualized. BLADDER: Prior to voiding the bladder had a volume of approximately 190 cc. The ureteral jets cannot be visualized . No distal ureteral dilatation or stone is identified. The post void bladder residual volume is 96  cc. The prostate measures 2.1 x 4.9 x 2.3 cm with a volume of 12 cc.      Post void residual volume of 96 cc. Fonnie Mu, MD 03/18/2018 8:48 PM    Chest Ap Portable    Result Date: 03/17/2018  HISTORY: Acute anterior chest pain, shortness of breath. COMPARISON: None available at dictation. FINDINGS: Single portable AP view the chest was performed. There is cardiomegaly with bilateral pleural effusions. There is mild increase in interstitial markings bilaterally. The upper lobe pulmonary veins are distended. There is tortuous aorta. No pneumothorax or consolidation is seen. There is focal volume loss in the lower lung fields. Trachea is judged unremarkable.     1.  Cardiomegaly with low-grade CHF. There is mild hypoventilation in the lung bases. Charlene Brooke, MD 03/17/2018 5:17 PM    [place] Venodopp Low Extremity Bilateral    Result Date: 03/18/2018  EXAMINATION: Venous Duplex Bilateral Lower Extremities CLINICAL HISTORY: 67 year old male with bilateral lower extremity pain and swelling TECHNIQUE:  Duplex evaluation of the veins of the lower extremities is performed from the lower pelvis to the upper calves bilaterally with gray scale imaging, transverse compression and gated and color Doppler techniques.  Additional calf vein evaluation is performed bilaterally to the ankle. INTERPRETATION:   Examination of the deep venous system of right and left lower extremities demonstrates no evidence of intraluminal thrombus or obstruction to venous flow.  Normal phasicity is present at the iliofemoral junctions indicating no central obstruction.  There is normal coaptation of the femoropopliteal veins throughout their course with transverse compression.  The saphenofemoral junction and greater saphenous are also noted to be widely patent bilaterally.  Deep and superficial veins of the calves are also demonstrated to be patent without thrombus or obstruction.         Normal venous duplex of the lower extremities.  No evidence of intraluminal thrombus or obstruction to venous flow in right or left lower extremity. Denna Haggard, MD 03/18/2018 8:57 AM          Assessment:    Shortness of breath   Acute systolic CHF   Dilated cardiomyopathy - LVEF <15%   NSVT   Hypertension/grade II diastolic dysfunction   Dilated RV with depressed systolic function   Mild aortic valve insufficiency   Mild mitral valve regurgitation/severely dilated LA   Severe tricuspid valve regurgitation/severely dilated RA   Pulmonary hypertension with estimated RV systolic pressure 53 mmHg   Renal insufficiency   History of ETOH abuse - up to 1/5 ETOH on weekends - none since June   History of  smoking      Recommendations:    Add hydralazine/nitroglycerin to lasix for cardiomyopathy and hypertension, unless able to use ACE inhibitor or ARB   Continue IV lasix   For lexiscan nuclear study today      Signed by: Kurtis Bushman, M.D., F.A.C.C.

## 2018-03-19 NOTE — Progress Notes (Addendum)
RENAL PROGRESS NOTE    Date Time: 03/19/18 4:30 PM  Patient Name: Chad Rubio  Attending Physician: [name], MD    Assessment:   Acute kidney injury versus chronic kidney disease.  Has hyaline cast in the urine but no red blood cells/protein.  Has severe cardiomyopathy with diastolic heart failure.  Creatinine remains stable.  Hypertension with kidney disease, blood pressure is fair.  Acute systolic congestive heart failure. Cardiology following, work-up in progress.  Thrombocytopenia  Plan:   Follow the renal panel. Will likely need further work up.  Continue with furosemide.  Monitor the blood pressure, avoid hypotension.  Monitor the hemoglobin.  Medication renal dose renal diet.  Bladder scan on the floor.  Patient is at high risk for contrast induced nephropathy, benefits and risks should be assessed.   Discussed with the patient and the primary team.  Subjective:   Patient seen and examined at bedside.  Awake and alert  No distress.  Review of Systems:   Did not offer any complaint.  No cough, dyspnea, chest pain, palpitation, headache, changes, nausea vomiting.  Meds:     Current Facility-Administered Medications   Medication Dose Route Frequency   . carvedilol  3.125 mg Oral Q12H [facility]   . folic acid  1 mg Oral Daily   . furosemide  40 mg Intravenous BID   . heparin (porcine)  5,000 Units Subcutaneous Q12H [facility]   . hydrALAZINE  10 mg Oral Q8H [facility]   . isosorbide mononitrate  30 mg Oral Daily   . multivitamin  1 tablet Oral Daily   . thiamine  100 mg Oral Daily       Physical Exam:   BP (!) 131/94   Pulse 90   Temp 97.4 F (36.3 C) (Oral)   Resp (!) 25   Ht 1.905 m (6\' 3" )   Wt 73.1 kg (161 lb 1.6 oz)   SpO2 97%   BMI 20.14 kg/m     Intake/Output Summary (Last 24 hours) at 03/19/18 1630  Last data filed at 03/19/18 1500   Gross per 24 hour   Intake              480 ml   Output             2395 ml   Net            -1915 ml       General: awake, alert, oriented x 3; no acute distress.   HEENT pallor,   Mucous membranes moist,   Neck - JVP not raised  Chest -Bilateral air entry   Heart - S1, S2,   Abdomen - soft, nontender, nondistended,   Extremities: no edema    Labs:     Recent Labs      03/18/18   0231  03/17/18   1700   WBC  5.24  4.67   Hgb  14.6  15.5   Hematocrit  46.6  49.1   Platelets  107*  112*   MCV  92.3  93.2     Recent Labs      03/18/18   0231  03/17/18   1700   Sodium  139  139   Potassium  5.1  4.6   Chloride  106  107   CO2  18*  18*   BUN  33.0*  31.0*   Creatinine  2.0*  2.1*   Glucose  114*  110*   Calcium  9.4  9.7   Magnesium  1.9   --      Recent Labs      03/17/18   1700   AST (SGOT)  68*   ALT  39   Alkaline Phosphatase  65   Protein, Total  8.0   Albumin  3.9     No results for input(s): PTT, PT, INR in the last 72 hours.                  Radiology Results (24 Hour)     Procedure Component Value Units Date/Time    [place] Myocardial Perfusion Spect (Stress And Rest) [[id number]] Collected:  03/19/18 0913    Order Status:  Completed Updated:  03/19/18 1429    Narrative:                                                                                             [facility]                                                                                   [address]                                                                                   [place], [place] [address]                                                                                   [phone]      Myocardial Perfusion Report      NILESH, STEGALL                  Exam Date: 03/19/2018 09:13          Ordering Phys: Montey Hora    MRN:  [id number]       Gender: M    Exam Location: [facility]       Referring Phys:   Age:  67   DOB:  September 20, 1950       Ht (in):75  Wt (lb):161 BSA  1.95    PCP:           Earlie Raveling S                                                              :  Nurse / ECG Tech:  Scientist, product/process development, Meg CNMT    Indications:     CHEST PAIN, ACUTE CORONARY SYNDROME SUSPECTED; 67 yo with recent onset CHF.   Procedure Performed:Pharmacological Stress and Rest Myocardial Perfusion SPECT Imaging with LV function analysis.   Patient History:  Hypertension, , Hyperlipidemia, Diabetes mellitus      IMPRESSIONS   Severe left ventricular dysfunction and calculated LVEF 23%.    Abnormal stress and rest myocardial perfusion study with  a small partially reversible defect in the infero-apical segment of the left    ventricle   Somewhat suboptimal study due to extensive patient motion                STRESS TEST     Protocol: Lexiscan   Duration (m:s):15 secsDose: 0.4mg  regadenoson given 30 seconds prior to stress radionuclide   Pharmacologic Administration Site:Intravenous Left     Administered FA:[id number] Gulilat      Resting HR (bpm):  94    Resting BP (mmHg):143  /98        MPHR:   153      Target HR:     130   Peak HR (bpm):     103   Peak BP (mmHg):   143  /98      % MPHR:   67       Double Product:[id number]   BP Response:       Normal hemodynamic response to Lexiscan.   Termination Reason:Lexiscan protocol completed.    ECG ANALYSIS         Stress Test Interpreted QI:ONGEX Lucianne Muss   The above interpretation is as per the stressing physician who was physically present and supervised and interpreted the study.   IMAGE PROTOCOL       Rest/Stress 1 Day         Stress radionuclide dose injected at peak stress.RadiopharDose (mCi)ticalAdministration SiteAdministered by   Rest:   Tc-83m Tetrofosmin   11.0         IV - left hand            Elder Cyphers CNMT   Stress: Tc-73m Tetrofosmin   32.0         IV - left hand            Elder Cyphers CNMT              Imaging  Date & Time Inj to Img Time (min)Camera Used                           Position   Rest:   19-Mar-2018          30                   S[employer]ens Symbia                        Supine   Stress: 19-Mar-2018          45                   S[employer]ens Symbia                        Supine  SPECT RESULTS         Technical Quality:  Adequate   Image Corrections:  Patient motion artifact - motion correction applied   PERFUSION FINDINGS            FUNCTIONAL RESULTS     (calculated via Gated SPECT)    Post Stress LV EF (%): 23    Stress EDV (mL):240           EDVI (ml/m??? ):123    Stress ESV (mL):186           ESVI (ml/m??? ):95         FUNCTIONAL FINDINGS:   Global hypokinesis        Laurena Slimmer     (Electronically Signed)     Final Date:19 March 2018 14:28     US Renal Kidney [[id number]] Collected:  03/18/18 2046    Order Status:  Completed Updated:  03/18/18 2052    Narrative:       RENAL ULTRASOUND     CLINICAL STATEMENT: Chronic renal failure.     COMPARISON: No prior studies are available for comparison.    TECHNIQUE: A sonogram of the kidneys was performed utilizing gray-scale  appearance and color Doppler.     FINDINGS:     RIGHT KIDNEY: The right kidney is normal in echogenicity  measuring 10.8   cm in longitudinal dimension. No right hydronephrosis, mass or calculi  are visualized.     LEFT KIDNEY: The left kidney is normal in echogenicity measuring 10.9   cm in longitudinal dimension. No left hydronephrosis, mass or calculi  are visualized.     BLADDER: Prior to voiding the bladder had a volume of approximately 190   cc. The ureteral jets cannot be visualized . No distal ureteral  dilatation or stone is identified. The post void bladder residual volume  is 96  cc.     The prostate measures 2.1 x 4.9 x 2.3 cm with a volume of 12 cc.      Impression:          Post void residual volume of 96 cc.     Fonnie Mu, MD   03/18/2018 8:48 PM          Mosie Lukes, MD  03/19/2018  4:30 PM  [phone]

## 2018-03-19 NOTE — Consults (Signed)
Cardiac Rehab Evaluation Note - MD Order    Patient meets criteria and provided explanation of cardiac rehabilitation, referral and brochure: Yes     Current Ejection Fraction: 15%    Additional Information:      Per CMS guidelines the following are eligible qualifying criteria for outpatient cardiac rehab:    - A heart attack in the last 12 months  - Coronary artery bypass surgery  - Current stable angina pectoris  - A heart valve repair or replacement  - A coronary angioplasty or coronary stent  - A heart or heart-lung transplant  - Stable chronic heart failure    - For CHF diagnosis the following additional criteria must be met:   - Left Ventricular ejection fraction <35%   - New York Heart Association (NYHA) class II to IV symptoms

## 2018-03-19 NOTE — Progress Notes (Signed)
This CM met with patient at bedside who states he lives with his son and a roommate. Patient does not drive and states he utilizes cabs or the bus for his transportation. [place]ole[place] plan - home with family.  CM will continue to follow for discharge needs     03/19/18 1739   Patient Type   Within 30 Days of Previous Admission? No   Healthcare Decisions   Interviewed: Patient   Orientation/Decision Making Abilities of Patient Alert and Oriented x3, able to make decisions   Advance Directive Patient does not have advance directive   Advance Directive not in Chart (given adv directive booklet)   Healthcare Agent Appointed No   Prior to admission   Prior level of function Independent with ADLs;Ambulates independently   Home Layout One level;Stairs to enter with rails (add number in comment)   Have running water, electricity, heat, etc? Yes   Living Arrangements Friends;Children   How do you get to your MD appointments? taxi   How do you get your groceries? taxi   Who fixes your meals? self   Who does your laundry? self   Who picks up your prescriptions? self   Dressing Independent   Grooming Independent   Feeding Independent   Bathing Independent   Toileting Independent   DME Currently at Home [employer]   Prior SNF admission? (Detail) n/a   Discharge Planning   Support Systems Children   Patient expects to be discharged to: HOME   Anticipated [place] plan discussed with: Patient   Mode of transportation: Private car (family member)   Does the patient have perscription coverage? Yes   Consults/Providers   PT Evaluation Needed 2   OT Evalulation Needed 2   SLP Evaluation Needed 2   Correct PCP listed in Epic? Yes   PCP   PCP on file was verified as the current PCP? Yes   Important Message from [facility] Notice   Patient received 1st IMM Letter? Yes   Date of most recent IMM given: 03/19/18

## 2018-03-19 NOTE — OT Eval Note (Signed)
[facility]  Occupational Therapy Evaluation    Patient: Chad Rubio  MRN#: [id number]   Unit: [address] [facility]  Bed: V4098/J1914-N    Time of Evaluation:  Time Calculation  OT Received On: 03/19/18  Start Time: 1447  Stop Time: 1509  Time Calculation (min): 22 min    Chart Review and Collaboration with Care Team: 10 minutes, not included in above time.    OT Visit Number: 1    Consult received for Chad Rubio for OT Evaluation and Treatment.  Patient's medical condition is appropriate for Occupational therapy intervention at this time.    Activity Orders:  OT eval and treat, progressive mobility protocol and activity as tolerated    Precautions and Contraindications:  Precautions  Precaution Instructions Given to Patient: Yes  Other Precautions: EF <15%, Fluid restriction    Medical Diagnosis:  Renal insufficiency [N28.9]  Elevated troponin [R74.8]  Congestive heart failure, unspecified HF chronicity, unspecified heart failure type [I50.9]    History of Present Illness: Chad Rubio is a 67 y.o. male admitted on 03/17/2018 who presented with a to 16-month history of progressive weakness, shortness of breath.  Dx: cardiomegaly with low grade CHF.    Patient Active Problem List   Diagnosis   . Congestive heart failure, unspecified HF chronicity, unspecified heart failure type       Past Medical/Surgical History:  Past Medical History:   Diagnosis Date   . Hypertension       History reviewed. No pertinent surgical history.      X-Rays/Tests/Labs:  Lab Results   Component Value Date/Time    HGB 14.6 03/18/2018 02:31 AM    HCT 46.6 03/18/2018 02:31 AM    K 5.1 03/18/2018 02:31 AM    NA 139 03/18/2018 02:31 AM    TROPI 0.13 (H) 03/18/2018 02:31 AM    TROPI 0.15 (H) 03/17/2018 11:03 PM    TROPI 0.14 (H) 03/17/2018 05:00 PM       All imaging reviewed, please see chart for details.    Social History:  Prior Level of Function  Prior level of function: Ambulates independently, Independent  with ADLs  Baseline Activity Level: Community ambulation, Household ambulation  Driving: independent  Dressing - Upper Body: independent  Dressing - Lower Body: independent  Cooking: Yes  Feeding: independent  Bathing: independent  Grooming: independent  Toileting: independent  Employment: Retired  DME Currently at [employer]: [employer], Other (Comment) (Raised height toilet)    Home Living Arrangements  Living Arrangements: Friends (Lives with 2 roommates)  Type of Home: Apartment  Home Layout: Elevator, Performs ADL's on one level (No STE)  Bathroom Shower/Tub: Medical sales representative: Raised  Bathroom Equipment: Grab bars in shower, Grab bars around toilet, Hand-held shower  Bathroom Accessibility: Accessible via walker  DME Currently at Home: Grab bars, Other (Comment) (Raised height toilet)    Subjective:  Subjective: "I'd really like to continue working on my cardiac output"   Patient is agreeable to participation in the therapy session. Nursing clears patient for therapy.     Pain Assessment  Pain Assessment: No/denies pain      Objective:        Observation of Patient/Vital Signs:  VSS    Patient received in bed with telemetry  in place.    Cognitive Status and Neuro Exam:  Cognition/Neuro Status  Arousal/Alertness: Appropriate responses to stimuli  Attention Span: Appears intact  Orientation Level: Oriented X4  Memory: Appears intact  Following Commands: independent  Safety Awareness: independent  Insights: Fully aware of deficits  Problem Solving: Able to problem solve independently  Behavior: attentive, calm, cooperative  Motor Planning: intact  Coordination: intact  Hand Dominance: right handed    Musculoskeletal Examination  Gross ROM  Right Upper Extremity ROM: within functional limits  Left Upper Extremity ROM: within functional limits  Gross Strength  Right Upper Extremity Strength: within functional limits  Left Upper Extremity Strength: within functional limits       Sensory/Oculomotor  Examination            Activities of Daily Living  Self-care and Home Management  Grooming: Independent  UB Dressing: Independent (Simulated)  LB Dressing: Independent  Toileting: Independent  Functional Transfers: Independent  Item Retrieval: Independent    Functional Mobility:  Mobility and Transfers  Rolling: Independent  Scooting to HOB: Independent  Scooting to EOB: Independent  Supine to Sit: Independent  Sit to Supine: Independent  Sit to Stand: Independent  Functional Mobility/Ambulation: Independent     Consulting civil engineer Sitting Balance: Independent  Dyanamic Sitting Balance: Independent  Static Standing Balance: Independent  Dynamic Standing Balance: Independent    Participation and Activity Tolerance  Participation and Endurance  Participation Effort: excellent  Endurance: Tolerates < 10 min exercise, no significant change in vital signs    Educated the patient to role of occupational therapy, plan of care, and energy conservation techniques.    Patient left in bed with call bell and all personal items/needs within reach. RN notified of session outcome.       Assessment:  Chad Rubio is a 67 y.o. male admitted 03/17/2018. Patient is at baseline and has no inpatient skilled OT needs at this time.     A Brief chart review was completed including review of labs, review of imaging and review of vitals.  There are no comorbidities or other factors that affect plan of care and require modification of task. Pt does not demonstrates performance deficits.   Pt's ability to complete ADLs and functional transfers is not impaired at this time.    Complexity Chart  Review Performance  Deficits Clinical Decision  Making Hx/Co-  morbidities Assistance needed   Low Brief 1-3 Limited options None None (or at baseline)       Rehabilitation Potential:  Prognosis: Good    Plan:  OT Frequency Recommended: therapy discontinued   Treatment Interventions: No skilled interventions needed at this time     PMP Activity:  Step 6 - Walks in Room  Distance Walked (ft) (Step 6,7): 30 Feet    AM-PAC:yes  OT Daily Activity Raw Score: 24  CMS 0-100% Score: 0.00%             Risks/benefits/POC discussed    Goals:                                      DME Recommended for Discharge: Shower chair  Discharge Recommendation: Other(Comment) (Cardiac rehab)       [place], [place]  [id number]    03/19/2018 3:16 PM

## 2018-03-19 NOTE — Progress Notes (Signed)
[facility] Progress Note      Date Time: 03/19/18 10:50 AM  Patient Name: Chad Rubio, Chad Rubio           Subjective:   Resting comfortably.  No shortness of breath.  No chest pain.  No palpitations.    Medications:     Current Facility-Administered Medications   Medication Dose Route Frequency Provider Last Rate Last Dose   . acetaminophen (TYLENOL) tablet 650 mg  650 mg Oral Once PRN Ahmed, Rochel Brome, MD       . folic acid (FOLVITE) tablet 1 mg  1 mg Oral Daily Jabbar, Lubna, MD   1 mg at 03/18/18 0955   . furosemide (LASIX) injection 40 mg  40 mg Intravenous BID Marya Amsler, MD   40 mg at 03/18/18 1418   . guaiFENesin-dextromethorphan (ROBITUSSIN DM) 100-10 MG/5ML syrup 5 mL  5 mL Oral Q4H PRN [name], MD   5 mL at 03/18/18 2152   . heparin (porcine) injection 5,000 Units  5,000 Units Subcutaneous Q12H [facility] Marya Amsler, MD   5,000 Units at 03/18/18 2148   . multivitamin tablet 1 tablet  1 tablet Oral Daily Marya Amsler, MD   1 tablet at 03/18/18 0955   . regadenoson (LEXISCAN) 0.4 MG/5ML injection            . thiamine (VITAMIN B1) tablet 100 mg  100 mg Oral Daily Marya Amsler, MD   100 mg at 03/18/18 0955       Physical Exam:     Vitals:    03/19/18 0825   BP: (!) 136/98   Pulse: 96   Resp: (!) 24   Temp: 97.4 F (36.3 C)   SpO2: 97%     Temp (24hrs), Avg:96.9 F (36.1 C), Min:96.2 F (35.7 C), Max:97.4 F (36.3 C)           Intake and Output Summary (Last 24 hours) at Date Time    Intake/Output Summary (Last 24 hours) at 03/19/18 1050  Last data filed at 03/19/18 0605   Gross per 24 hour   Intake              440 ml   Output             2595 ml   Net            -2155 ml       General Appearance: Resting comfortably  Head:  normocephalic  Eyes:  EOM's intact  Neck:  No carotid bruit or significant jugular venous distension, brisk carotid upstroke  Lungs:  Clear to auscultation throughout, good respiratory effort   Heart: normal S1, S2, no S3, S4, murmurs or rubs.  Non-palpable PMI.  Abdomen:   Soft, non-tender, normoactive bowel sounds  Extremities:  No cyanosis, clubbing or edema  Pulses: 2+ distal pulses  Neurologic:  Alert and oriented x3, 4-5+ strength throughout  ENT:  Unremarkable  Mood: Normal      Labs:   Cardiac Enzymes:    Recent Labs  Lab 03/18/18  0231 03/17/18  2303 03/17/18  1700   Troponin I 0.13* 0.15* 0.14*     No results for input(s): TROPONIN, ISTATTROPONI, CK in the last 24 hours.    Invalid input(s): TROPONINT, CKMB[24          Lipid profile:        Recent Labs  Lab 03/17/18  1700   Bilirubin, Total 2.1*   Bilirubin, Direct 1.5*   Protein, Total  8.0   Albumin 3.9   ALT 39   AST (SGOT) 68*     No results for input(s): CHOL, TRIG, HDL in the last 24 hours.    Invalid input(s): LDLC, VLDLC, LRAT[24      Recent Labs  Lab 03/18/18  0231   Magnesium 1.9           Recent Labs  Lab 03/18/18  0231 03/17/18  1700   WBC 5.24 4.67   Hgb 14.6 15.5   Hematocrit 46.6 49.1   Platelets 107* 112*   MCV 92.3 93.2   MCHC 31.3* 31.6   RDW 14 14       Recent Labs  Lab 03/18/18  0231 03/17/18  1700   Sodium 139 139   Potassium 5.1 4.6   Chloride 106 107   CO2 18* 18*   BUN 33.0* 31.0*   Creatinine 2.0* 2.1*   EGFR 40.5 38.3   Glucose 114* 110*   Calcium 9.4 9.7   AST (SGOT)  --  68*   ALT  --  39           Invalid input(s): FREET4        Invalid input(s): PTI, COUM, ACOAG, ACOAP  .  Lab Results   Component Value Date    BNP 7,289 (H) 03/17/2018        Recent ABG No results for input(s): TEMP, FIO2, RATE, MODE, ETCO2, PEEP in the last 24 hours.    Invalid input(s): APH, APCO2, APO2, AHCO3, ATCO2, ABE, AOSAT, ABGS, ALLEN, STATS, O2DEL, O2FLO, PRESS, VNTMN, PRSUP, TIVOL[24      Radiology:       Echocardiogram Adult Complete W Clr/ Dopp Waveform    Result Date: 03/18/2018  Name:     Chad Rubio Age:     67 years DOB:     1950/10/06 Gender:     Male MRN:     [id number] Wt:     178 lb BSA:     2.06 m2 HR:     98 bpm Systolic BP:     540 mmHg Diastolic BP:     981 mmHg Technical Quality:     Good Exam Date/Time:      03/18/2018 8:22 AM Exam Type:     ECHOCARDIOGRAM ADULT COMPLETE W CLR/ DOPP WAVEFORM Staff Sonographer:     Bubba Camp,  RDCS Ordering Physician:     Elam Dutch Study Info Indications      - new onset chf Procedure   Complete two-dimensional, color flow and spectral Doppler transthoracic echocardiogram is performed. 75 in History/Risk Factors Hypertension:     Yes Hyperlipidemia:     Yes Tobacco Use:     Yes Additional Patient History   Increased SOB, CHF, increased troponin and renal failure. No prior echocardiogram history available for comparison. Summary   * The left atrium is severely dilated.   * Left ventricular ejection fraction is severely decreased with an estimated ejection fraction of <15%.   * The right atrium is severely dilated.   * The left ventricle is markedly  dilated with mild concentric left ventricular hypertophy and severe global left ventricular hypokinesis. Visually estimated LVEF is <15%.   * Left ventricular diastolic filling parameters are consistent with Grade II diastolic dysfunction (pseudonormal pattern) with elevated left atrial pressure.   * The right ventricle is dilated with  depressed right ventricular systolic function. .   * ortic valve is trileaflet with no aortic stenosis  and mild aortic insufficiency. .   * There is mild mitral regurgitation.   * There is severe tricuspid regurgitation with severe pulmonary hypertension.  Estimated right ventricular systolic pressure is 53 mmHg. .   * There is mild pulmonic regurgitation.   * Small circumferential pericardial effusion visualized which is not hemodynamically significant. .   * No thrombi, vegetations or other abnormal intracardiac masses. Findings Left Ventricle   The left ventricle is markedly  dilated with mild concentric left ventricular hypertophy and severe global left ventricular hypokinesis. Visually estimated LVEF is <15%.   Left ventricular wall thickness is mildly increased.   Left ventricular  ejection fraction is severely decreased with an estimated ejection fraction of <15%.   Left ventricular segmental wall motion is normal.   Left ventricular diastolic filling parameters are consistent with Grade II diastolic dysfunction (pseudonormal pattern) with elevated left atrial pressure. Right Ventricle   The right ventricle is dilated with  depressed right ventricular systolic function. .   Decreased right ventricular systolic function. Left Atrium   The left atrium is severely dilated. Right Atrium   The right atrium is severely dilated. Atrial Septum   No evidence of interatrial shunt by color Doppler. Aortic Valve   The aortic valve is tricuspid.   ortic valve is trileaflet with no aortic stenosis and mild aortic insufficiency. .   There is mild aortic regurgitation. Pulmonary Valve   The pulmonic valve is structurally normal.   There is mild pulmonic regurgitation. Mitral Valve   The mitral valve is structurally normal.   There is mild mitral regurgitation. Tricuspid Valve   The tricuspid valve is structurally normal.   There is severe tricuspid regurgitation with severe pulmonary hypertension. Estimated right ventricular systolic pressure is 53 mmHg. Marland Kitchen   Severe pulmonary hypertension with estimated right ventricular systolic pressure of 63 mmHg. Pericardium / Pleural Effusion   Small circumferential pericardial effusion visualized which is not hemodynamically significant. . Inferior Vena Cava   The IVC is dilated with < 50% respiratory variance consistent with significantly elevated RA pressure of 15 mmHg. Aorta   The aortic root is normal in size.   The ascending aorta is normal in size. Measurements 2D Measurements ---------------------------------------------------------------------- Name                                 Value        Normal ---------------------------------------------------------------------- Parasternal 2D ---------------------------------------------------------------------- RVID  Diastole (2D)                 4.85 cm     2.50-3.50 IVS Diastolic Thickness (2D)       7.42 cm     0.60-1.00 LVID Diastole (2D)                 6.55 cm     4.20-5.80 LVIW Diastolic Thickness (2D)                               1.06 cm     0.60-1.05 LVID Systole (2D)                  6.26 cm     2.50-4.00 LV Ejection Fraction 2D ---------------------------------------------------------------------- LV EF (2D Teichholz)                  10 %  52-72 Apical 2D Dimensions ---------------------------------------------------------------------- LA Volume Index (BP A-L)       60.29 ml/m2   16.00-34.00 M-mode Measurements ---------------------------------------------------------------------- Name                                 Value        Normal ---------------------------------------------------------------------- M-Mode ---------------------------------------------------------------------- Ao Root Diameter (MM)              3.44 cm               LA Dimension (MM)                  5.09 cm     3.00-4.10 AV Cusp Sep (MM)                   2.08 cm               TAPSE                              0.89 cm        >=1.60 LVOT/Aortic Valve Doppler Measurements ---------------------------------------------------------------------- Name                                 Value        Normal ---------------------------------------------------------------------- LVOT Doppler ---------------------------------------------------------------------- LVOT Peak Velocity                0.80 m/s               AoV Doppler ---------------------------------------------------------------------- AV Peak Velocity                  0.69 m/s               AV Peak Gradient                 1.90 mmHg               AV V1/V2 Ratio                        1.17               AoV Regurgitation Doppler ---------------------------------------------------------------------- AR PHT                              996 ms RVOT/Pulmonic Valve Doppler Measurements  ---------------------------------------------------------------------- Name                                 Value        Normal ---------------------------------------------------------------------- PV Doppler ---------------------------------------------------------------------- PV Peak Velocity                  0.61 m/s Mitral Valve Measurements ---------------------------------------------------------------------- Name                                 Value        Normal ---------------------------------------------------------------------- MV Doppler ---------------------------------------------------------------------- MV E Peak Velocity                0.74 m/s  MV A Peak Velocity                0.45 m/s               MV E/A                                1.65               MV Pressure Half Time                41 ms               MV Annular TDI ---------------------------------------------------------------------- MV Septal e' Velocity             0.03 m/s        >=0.08 MV E/e' (Septal)                     24.71        <=8.00 MV Lateral e' Velocity            0.06 m/s        >=0.10 MV E/e' (Lateral)                    12.35        <=8.00 Tricuspid Valve Measurements ---------------------------------------------------------------------- Name                                 Value        Normal ---------------------------------------------------------------------- TV Doppler ---------------------------------------------------------------------- TV E Peak Velocity                0.54 m/s               TV Regurgitation Doppler ---------------------------------------------------------------------- TR Peak Velocity                  3.46 m/s               TR Peak Gradient                   48 mmHg               RA Pressure                        15 mmHg           <=3 RV Systolic Pressure               63 mmHg           <36 Aorta / Venous Measurements  ---------------------------------------------------------------------- Name                                 Value        Normal ---------------------------------------------------------------------- IVC/SVC ---------------------------------------------------------------------- IVC Diameter (Exp 2D)              2.49 cm        <=2.10 Report Signatures Finalized [id number]  Rosenfeld on 03/18/2018 6:58:05 PM Promoted [id number]  [place] on 03/18/2018 9:32:50 AM    US Renal Kidney    Result Date: 03/18/2018  RENAL ULTRASOUND CLINICAL STATEMENT: Chronic renal failure. COMPARISON: No prior studies are available for comparison. TECHNIQUE: A sonogram of the kidneys was performed utilizing gray-scale  appearance and color Doppler. FINDINGS: RIGHT KIDNEY: The right kidney is normal in echogenicity  measuring 10.8  cm in longitudinal dimension. No right hydronephrosis, mass or calculi are visualized. LEFT KIDNEY: The left kidney is normal in echogenicity measuring 10.9 cm in longitudinal dimension. No left hydronephrosis, mass or calculi are visualized. BLADDER: Prior to voiding the bladder had a volume of approximately 190 cc. The ureteral jets cannot be visualized . No distal ureteral dilatation or stone is identified. The post void bladder residual volume is 96  cc. The prostate measures 2.1 x 4.9 x 2.3 cm with a volume of 12 cc.      Post void residual volume of 96 cc. Fonnie Mu, MD 03/18/2018 8:48 PM    Chest Ap Portable    Result Date: 03/17/2018  HISTORY: Acute anterior chest pain, shortness of breath. COMPARISON: None available at dictation. FINDINGS: Single portable AP view the chest was performed. There is cardiomegaly with bilateral pleural effusions. There is mild increase in interstitial markings bilaterally. The upper lobe pulmonary veins are distended. There is tortuous aorta. No pneumothorax or consolidation is seen. There is focal volume loss in the lower lung fields. Trachea is judged unremarkable.     1.  Cardiomegaly with low-grade CHF. There is mild hypoventilation in the lung bases. Charlene Brooke, MD 03/17/2018 5:17 PM    [place] Venodopp Low Extremity Bilateral    Result Date: 03/18/2018  EXAMINATION: Venous Duplex Bilateral Lower Extremities CLINICAL HISTORY: 67 year old male with bilateral lower extremity pain and swelling TECHNIQUE:  Duplex evaluation of the veins of the lower extremities is performed from the lower pelvis to the upper calves bilaterally with gray scale imaging, transverse compression and gated and color Doppler techniques.  Additional calf vein evaluation is performed bilaterally to the ankle. INTERPRETATION:   Examination of the deep venous system of right and left lower extremities demonstrates no evidence of intraluminal thrombus or obstruction to venous flow.  Normal phasicity is present at the iliofemoral junctions indicating no central obstruction.  There is normal coaptation of the femoropopliteal veins throughout their course with transverse compression.  The saphenofemoral junction and greater saphenous are also noted to be widely patent bilaterally.  Deep and superficial veins of the calves are also demonstrated to be patent without thrombus or obstruction.         Normal venous duplex of the lower extremities.  No evidence of intraluminal thrombus or obstruction to venous flow in right or left lower extremity. Denna Haggard, MD 03/18/2018 8:57 AM          Assessment:    Shortness of breath   Acute systolic CHF   Dilated cardiomyopathy - LVEF <15%   NSVT   Hypertension/grade II diastolic dysfunction   Dilated RV with depressed systolic function   Mild aortic valve insufficiency   Mild mitral valve regurgitation/severely dilated LA   Severe tricuspid valve regurgitation/severely dilated RA   Pulmonary hypertension with estimated RV systolic pressure 53 mmHg   Renal insufficiency   History of ETOH abuse - up to 1/5 ETOH on weekends - none since June   History of  smoking      Recommendations:    Add hydralazine/nitroglycerin to lasix for cardiomyopathy, unless able to use ACE inhibitor or ARB   Add coreg as well for cardiomyopathy   Add hydralazine and coreg for hypertension   Continue IV lasix   For lexiscan nuclear study today      Signed by: Kurtis Bushman,  M.D., F.A.C.C.

## 2018-03-19 NOTE — Progress Notes (Signed)
[facility]  PROGRESS NOTE      Patient: Chad Rubio  Date: 03/19/2018   LOS: 1 Days  Admission Date: 03/17/2018   MRN: [id number]  Attending: [name]  Please contact me on the following pager [phone]       ASSESSMENT/PLAN     Chad Rubio is a 67 y.o. male admitted with acute congestive heart failure    Interval Summary:     Patient Active Hospital Problem List:  1. New onset acute combined systolic/diastolic CHF: (SOB-rest and exertional, fatigue, orthopnea, JVD, + CXR, BNP 7289) ejection fraction less than 15% with grade 2 diastolic dysfunction  2. HTN  3. ARF: Cr 2.1 POA. Unsure baseline.  4. Abnormal EKG/abnormal nuclear stress test  5. Binge drinking: 1 pint on weekend  6. LLE swelling  7.  NSVT      -Reviewed CXR; showing cardiomegaly with bilateral pleural effusion.  -Reviewed EKG: Normal sinus rhythm.  T-wave inversion in lateral leads.  Prolonged QTC.  -Status post echocardiogram showing severely reduced left ventricular systolic function with ejection fraction of less than 15%.  Patient also had grade 2 diastolic dysfunction.  Severely dilated right atrium mild concentric left ventricular hypertrophy and severe global left ventricular hypokinesis.  Severe tricuspid regurgitation, mild mitral regurgitation, severe pulmonary hypertension mild aortic insufficiency    Status post nuclear stress test showing abnormal stress test and rest myocardial perfusion study with small partially reversible defect in the inferior apical segment of the left ventricle    -Continue with IV Lasix with recheck electrolytes and renal functions. Strict I&Os, daily weight, water restriction, low salt diet and cardiac rehab on discharge.  -Nephrology on board  -Follow-up with TSH  -We will keep the patient n.p.o. after midnight for possible cardiac catheterization in the morning    -Alcohol cessation education. Multivitamins.  Thiamine and folate.  Last drink was on weekend.      Nutrition  2gms Na diet with  fluid restriction.    DVT/VTE Prophylaxis  SCD/Heparin.               Code Status: Full    DISPO: To be determined             SUBJECTIVE     Chad Rubio states that he feels much better denies any new complaints    MEDICATIONS     Current Facility-Administered Medications   Medication Dose Route Frequency   . carvedilol  3.125 mg Oral Q12H [facility]   . folic acid  1 mg Oral Daily   . furosemide  40 mg Intravenous BID   . heparin (porcine)  5,000 Units Subcutaneous Q12H [facility]   . hydrALAZINE  10 mg Oral Q8H [facility]   . isosorbide mononitrate  30 mg Oral Daily   . multivitamin  1 tablet Oral Daily   . thiamine  100 mg Oral Daily       PHYSICAL EXAM     Vitals:    03/19/18 1642   BP: 112/75   Pulse: 95   Resp: 20   Temp: (!) 96.6 F (35.9 C)   SpO2: 99%       Temperature: Temp  Min: 96.6 F (35.9 C)  Max: 97.4 F (36.3 C)  Pulse: Pulse  Min: 90  Max: 98  Respiratory: Resp  Min: 18  Max: 25  Non-Invasive BP: BP  Min: 112/75  Max: 144/99  Pulse Oximetry SpO2  Min: 97 %  Max: 99 %  Intake and Output Summary (Last 24 hours) at Date Time    Intake/Output Summary (Last 24 hours) at 03/19/18 1854  Last data filed at 03/19/18 1839   Gross per 24 hour   Intake              720 ml   Output             2170 ml   Net            -1450 ml         GEN APPEARANCE: Normal;  A&OX3  HEENT: PERLA; EOMI; Conjunctiva Clear  NECK: Supple; No bruits  CVS: RRR, S1, S2; No M/G/R  LUNGS: CTAB; No Wheezes; No Rhonchi: Improved crackles   ABD: Soft; No TTP; + Normoactive BS  EXT: 2+ edema; Pulses 2+ and intact  Skin exam:  no pallor  NEURO: CN 2-12 intact; No Focal neurological deficits  CAP REFILL:  Normal  MENTAL STATUS:  Normal          LABS       Recent Labs  Lab 03/18/18  0231 03/17/18  1700   WBC 5.24 4.67   RBC 5.05 5.27   Hgb 14.6 15.5   Hematocrit 46.6 49.1   MCV 92.3 93.2   Platelets 107* 112*         Recent Labs  Lab 03/18/18  0231 03/17/18  1700   Sodium 139 139   Potassium 5.1 4.6   Chloride 106 107   CO2 18* 18*   BUN 33.0* 31.0*    Creatinine 2.0* 2.1*   Glucose 114* 110*   Calcium 9.4 9.7   Magnesium 1.9  --          Recent Labs  Lab 03/17/18  1700   ALT 39   AST (SGOT) 68*   Bilirubin, Total 2.1*   Bilirubin, Direct 1.5*   Albumin 3.9   Alkaline Phosphatase 65         Recent Labs  Lab 03/18/18  0231 03/17/18  2303 03/17/18  1700   Troponin I 0.13* 0.15* 0.14*             Microbiology Results     None           RADIOLOGY     US Renal Kidney    Result Date: 03/18/2018   Post void residual volume of 96 cc. Fonnie Mu, MD 03/18/2018 8:48 PM    Chest Ap Portable    Result Date: 03/17/2018  1. Cardiomegaly with low-grade CHF. There is mild hypoventilation in the lung bases. Charlene Brooke, MD 03/17/2018 5:17 PM    [place] Venodopp Low Extremity Bilateral    Result Date: 03/18/2018      Normal venous duplex of the lower extremities.  No evidence of intraluminal thrombus or obstruction to venous flow in right or left lower extremity. Denna Haggard, MD 03/18/2018 8:57 AM      Signed,  [name]  6:54 PM 03/19/2018

## 2018-03-19 NOTE — Progress Notes (Signed)
Transitional Care Management    Writer provided an introduction and information about the TCM program, and program involvement in the patient's care post discharge for CHF education and management; patient verbalized understanding. The patient identified himself, as the best contact person post discharge: [phone]. Writer provided the patient with a TCM program brochure, writers and TCM contact information, and provided and explained CHF Commitment to Care/Zones sheet. TCM will continue to monitor the patient's discharge plan for appropriateness of TCM program involvement post discharge.    Lorraine Lax BSN RN ACM-RN  Case Astronomer Transitional Care Management   [phone]

## 2018-03-19 NOTE — Consults (Signed)
Cardiac Rehab Evaluation Note- MD order    Patient meets criteria and provided explanation of cardiac rehabilitation, referral and brochure: Yes       Current Ejection Fraction: 15%            Additional Information:      Per CMS guidelines the following are eligible qualifying criteria for outpatient cardiac rehab:    - A heart attack in the last 12 months  - Coronary artery bypass surgery  - Current stable angina pectoris  - A heart valve repair or replacement  - A coronary angioplasty or coronary stent  - A heart or heart-lung transplant  - Stable chronic heart failure    - For CHF diagnosis the following additional criteria must be met:   - Left Ventricular ejection fraction <35%   - New York Heart Association (NYHA) class II to IV symptoms

## 2018-03-19 NOTE — Plan of Care (Signed)
Problem: Safety  Goal: Patient will be free from injury during hospitalization  Outcome: Progressing   03/19/18 1548   Goal/Interventions addressed this shift   Patient will be free from injury during hospitalization  Assess patient's risk for falls and implement fall prevention plan of care per policy;Provide and maintain safe environment;Use appropriate transfer methods;Ensure appropriate safety devices are available at the bedside;Include patient/ family/ care giver in decisions related to safety;Hourly rounding       Problem: Everyday - Heart Failure  Goal: Stable Vital Signs and Fluid Balance  Outcome: Progressing   03/19/18 1548   Goal/Interventions addressed this shift   Stable Vital Signs and Fluid Balance Daily Standing Weights in the morning using the same scale, after using the bathroom and before breadfast. If unable to stand, zero the bed and use the bed scale;Monitor, assess vital signs and telemetry per policy;Monitor labs and report abnormalities to physician;Strict Intake/Output;Assess for swelling/edema;Wean oxygen as needed if appropriate       Comments: Patient A/O x4. SR on the monitor. On room air. Still verbalized shortness of breath with exertions at times. Morning dose of lasix not given patient went for stress test. Patient back to the around 1245. Diet tolerated and due medications given. Discussed fluid restrictions and to use urinal for accurate output. Patient verbalized understanding. Will continue to monitor vital signs, daily weight and I/O. Safe environment provided.

## 2018-03-19 NOTE — Plan of Care (Signed)
Problem: Occupational Therapy  Goal: By discharge, patient will perform self-care at the patient's highest functional potential.  See OT evaluation/note for goals.  Outcome: Completed Date Met: 03/19/18  Discharge Recommendation: Other(Comment) (Cardiac rehab)   DME Recommended for Discharge: Shower chair    OT Frequency Recommended: therapy discontinued     Next Visit Recommendation:         Is PT evaluation indicated at this time? Yes, a PT evaluation is indicated at this time.  Pt expressed interest in being trained on how to use a cane d/t some unsteadiness noted when performing challenging IADLs (eg. Carrying groceries).    PMP Activity: Step 6 - Walks in Room  Distance Walked (ft) (Step 6,7): 30 Feet  (Please See Therapy Evaluation for device and assistance level needed)    Treatment Interventions: No skilled interventions needed at this time

## 2018-03-20 LAB — COMPREHENSIVE METABOLIC PANEL
ALT: 62 U/L — ABNORMAL HIGH (ref 0–55)
ALT: 42 U/L (ref 0–55)
AST (SGOT): 90 U/L — ABNORMAL HIGH (ref 5–34)
AST (SGOT): 54 U/L — ABNORMAL HIGH (ref 5–34)
Albumin/Globulin Ratio: 1 (ref 0.9–2.2)
Albumin/Globulin Ratio: 1 (ref 0.9–2.2)
Albumin: 3.5 g/dL (ref 3.5–5.0)
Albumin: 3.1 g/dL — ABNORMAL LOW (ref 3.5–5.0)
Alkaline Phosphatase: 62 U/L (ref 38–106)
Alkaline Phosphatase: 54 U/L (ref 38–106)
Anion Gap: 14 (ref 5.0–15.0)
Anion Gap: 13 (ref 5.0–15.0)
BUN: 39 mg/dL — ABNORMAL HIGH (ref 9.0–28.0)
BUN: 34 mg/dL — ABNORMAL HIGH (ref 9.0–28.0)
Bilirubin, Total: 1.8 mg/dL — ABNORMAL HIGH (ref 0.2–1.2)
Bilirubin, Total: 2.1 mg/dL — ABNORMAL HIGH (ref 0.2–1.2)
CO2: 24 mEq/L (ref 22–29)
CO2: 26 mEq/L (ref 22–29)
Calcium: 9.3 mg/dL (ref 8.5–10.5)
Calcium: 8.6 mg/dL (ref 8.5–10.5)
Chloride: 103 mEq/L (ref 100–111)
Chloride: 102 mEq/L (ref 100–111)
Creatinine: 2 mg/dL — ABNORMAL HIGH (ref 0.7–1.3)
Creatinine: 1.7 mg/dL — ABNORMAL HIGH (ref 0.7–1.3)
Globulin: 3.4 g/dL (ref 2.0–3.6)
Globulin: 3.1 g/dL (ref 2.0–3.6)
Glucose: 96 mg/dL (ref 70–100)
Glucose: 101 mg/dL — ABNORMAL HIGH (ref 70–100)
Potassium: 5 mEq/L (ref 3.5–5.1)
Potassium: 3.9 mEq/L (ref 3.5–5.1)
Protein, Total: 6.9 g/dL (ref 6.0–8.3)
Protein, Total: 6.2 g/dL (ref 6.0–8.3)
Sodium: 141 mEq/L (ref 136–145)
Sodium: 141 mEq/L (ref 136–145)

## 2018-03-20 LAB — CBC
Absolute NRBC: 0 10*3/uL (ref 0.00–0.00)
Absolute NRBC: 0 10*3/uL (ref 0.00–0.00)
Hematocrit: 46.3 % (ref 37.6–49.6)
Hematocrit: 41 % (ref 37.6–49.6)
Hgb: 14.3 g/dL (ref 12.5–17.1)
Hgb: 13.1 g/dL (ref 12.5–17.1)
MCH: 28.8 pg (ref 25.1–33.5)
MCH: 28.9 pg (ref 25.1–33.5)
MCHC: 30.9 g/dL — ABNORMAL LOW (ref 31.5–35.8)
MCHC: 32 g/dL (ref 31.5–35.8)
MCV: 93.2 fL (ref 78.0–96.0)
MCV: 90.5 fL (ref 78.0–96.0)
MPV: 14.1 fL — ABNORMAL HIGH (ref 8.9–12.5)
MPV: 14.3 fL — ABNORMAL HIGH (ref 8.9–12.5)
Nucleated RBC: 0 /100 WBC (ref 0.0–0.0)
Nucleated RBC: 0 /100 WBC (ref 0.0–0.0)
Platelets: 100 10*3/uL — ABNORMAL LOW (ref 142–346)
Platelets: 90 10*3/uL — ABNORMAL LOW (ref 142–346)
RBC: 4.97 10*6/uL (ref 4.20–5.90)
RBC: 4.53 10*6/uL (ref 4.20–5.90)
RDW: 14 % (ref 11–15)
RDW: 13 % (ref 11–15)
WBC: 6.6 10*3/uL (ref 3.10–9.50)
WBC: 6.9 10*3/uL (ref 3.10–9.50)

## 2018-03-20 LAB — BILIRUBIN, DIRECT: Bilirubin Direct: 1.3 mg/dL — ABNORMAL HIGH (ref 0.0–0.5)

## 2018-03-20 LAB — HEMOLYSIS INDEX
Hemolysis Index: 8 (ref 0–18)
Hemolysis Index: 3 (ref 0–18)
Hemolysis Index: 8 (ref 0–18)

## 2018-03-20 LAB — GFR
EGFR: 40.5
EGFR: 48.9

## 2018-03-20 LAB — MAGNESIUM
Magnesium: 2 mg/dL (ref 1.6–2.6)
Magnesium: 1.6 mg/dL (ref 1.6–2.6)

## 2018-03-20 LAB — TSH: TSH: 1.47 u[IU]/mL (ref 0.35–4.94)

## 2018-03-20 LAB — PHOSPHORUS: Phosphorus: 3.6 mg/dL (ref 2.3–4.7)

## 2018-03-20 MED ORDER — MAGNESIUM SULFATE IN D5W 1-5 GM/100ML-% IV SOLN
1.0000 g | INTRAVENOUS | Status: AC
Start: 2018-03-20 — End: 2018-03-20
  Administered 2018-03-20 (×2): 1 g via INTRAVENOUS
  Filled 2018-03-20: qty 200

## 2018-03-20 MED ORDER — CARVEDILOL 6.25 MG PO TABS
6.25 mg | ORAL_TABLET | Freq: Two times a day (BID) | ORAL | Status: DC
Start: 2018-03-20 — End: 2018-03-21
  Administered 2018-03-20 – 2018-03-21 (×2): 6.25 mg via ORAL
  Filled 2018-03-20 (×2): qty 1

## 2018-03-20 NOTE — UM Notes (Signed)
03/20/2018    Medicare [employer]      03/18/18 1417  Admit to Inpatient Once              Patient: Chad Rubio  Date: 03/19/2018   LOS: 1 Days  Admission Date: 03/17/2018   MRN: [id number]  Attending: [name]  Please contact me on the following pager [phone]       ASSESSMENT/PLAN     [name] is a 67 y.o. male admitted with acute congestive heart failure    Interval Summary:     Patient Active Hospital Problem List:  1. New onset acute combined systolic/diastolic CHF:(SOB-rest and exertional, fatigue, orthopnea, JVD, + CXR, BNP 7289) ejection fraction less than 15% with grade 2 diastolic dysfunction  2. HTN  3. ARF:Cr 2.1 POA. Unsure baseline.  4. Abnormal EKG/abnormal nuclear stress test  5. Binge drinking: 1 pint on weekend  6. LLE swelling  7.  NSVT      -Reviewed CXR; showing cardiomegaly with bilateral pleural effusion.  -Reviewed EKG: Normal sinus rhythm. T-wave inversion in lateral leads. Prolonged QTC.  -Status post echocardiogram showing severely reduced left ventricular systolic function with ejection fraction of less than 15%.  Patient also had grade 2 diastolic dysfunction.  Severely dilated right atrium mild concentric left ventricular hypertrophy and severe global left ventricular hypokinesis.  Severe tricuspid regurgitation, mild mitral regurgitation, severe pulmonary hypertension mild aortic insufficiency    Status post nuclear stress test showing abnormal stress test and rest myocardial perfusion study with small partially reversible defect in the inferior apical segment of the left ventricle    -Continue with IV Lasix with recheck electrolytes and renal functions. Strict I&Os, daily weight, water restriction, low salt diet and cardiac rehab on discharge.  -Nephrology on board  -Follow-up with TSH  -We will keep the patient n.p.o. after midnight for possible cardiac catheterization in the morning    -Alcohol cessation education. Multivitamins.  Thiamine and folate.  Last  drink was on weekend.      Nutrition  2gms Na diet with fluid restriction.    DVT/VTE Prophylaxis  SCD/Heparin.     Date Time: 03/19/18 4:30 PM  Patient Name: Chad Rubio  Attending Physician: [name], MD    Assessment:   Acute kidney injury versus chronic kidney disease.  Has hyaline cast in the urine but no red blood cells/protein.  Has severe cardiomyopathy with diastolic heart failure.  Creatinine remains stable.  Hypertension with kidney disease, blood pressure is fair.  Acute systolic congestive heart failure. Cardiology following, work-up in progress.  Thrombocytopenia  Plan:   Follow the renal panel. Will likely need further work up.  Continue with furosemide.  Monitor the blood pressure, avoid hypotension.  Monitor the hemoglobin.  Medication renal dose renal diet.  Bladder scan on the floor.  Patient is at high risk for contrast induced nephropathy, benefits and risks should be assessed.   Discussed with the patient and the primary team.  Subjective:   Patient seen and examined at bedside.  Awake and alert  No distress.    Date Time: 03/20/18 8:18 AM  Patient Name: [name], [name]         Patient Active Problem List   Diagnosis   . Congestive heart failure, unspecified HF chronicity, unspecified heart failure type     Assessment:    Patient admitted 03/17/2018 with shortness of breath  ? Cardiomegaly with low-grade HF per CXR  ? BNP elevation >7K  Echocardiogram (03/18/2018) revealing markedly dilated LV with severely depressed LVSF, LVEF <15%, mild LVH, grade II diastolic dysfunction, decreased RVSF with severe pHTN, mild MR, mild AI, mild PR, severe TR, small circumferential pericardial effusion that is no hemodynamically significant.   Nuclear ST (03/19/2018) revealing a small partially reversible infero-apical defect, severe LV systolic dysfunction with LVEF 23%.   HTN   Non-sustained VT   Renal Insufficiency   Thrombocytopenia    Tobacco use    ETOH abuse      Recommendations:      Continue GDMT for HFrEF   ? Increase coreg to 6.25 mg BID (done)  ? Initiate valsartan, spironolactone as able pending renal function, nephrology input   Maintain K+>= 4.0, Mg++>=2.0   Please weigh patient daily on STANDING scale and record I&O    Heart Failure Type: HFrEF and diastolic dysfunction    Acuity: acute    Last filed weight: Weight: 69.5 kg (153 lb 3.2 oz)  Weight on Admission: Weight: 80.7 kg (178 lb)    Ejection Fraction: 15%  Method & Date: echo 03/18/2018    Fluid Restriction: 1500 cc    Sodium Restriction: 2g Sodium Diet      Physical Exam:   BP 124/83   Pulse 91   Temp (!) 96.6 F (35.9 C) (Oral)   Resp 18   Ht 1.905 m (6\' 3" )   Wt 73.1 kg (161 lb 1.6 oz)   SpO2 99%   BMI 20.14 kg/m  O2 Flow Rate (L/min): 2 L/min O2 Device: None (Room air)    Temp (24hrs), Avg:97 F (36.1 C), Min:96.6 F (35.9 C), Max:97.7 F (36.5 C)    Recent Labs  Lab 03/18/18  0231 03/17/18  2303 03/17/18  1700   Troponin I 0.13* 0.15* 0.14*       Recent Labs  Lab 03/20/18  0435   Bilirubin, Total 2.1*   Bilirubin, Direct 1.3*   Protein, Total 6.2   Albumin 3.1*   ALT 42   AST (SGOT) 54*       Platelets 90, BUN 34.0, Creatanine 1.7, Glucose 101,     Myocardial perfusion report  IMPRESSIONS   Severe left ventricular dysfunction and calculated LVEF 23%.    Abnormal stress and rest myocardial perfusion study with  a small partially reversible defect in the infero-apical segment of the left    ventricle   Somewhat suboptimal study due to extensive patient motion      03/20/2018    Coreg PO Q12, Folvite PO daily, Lasix IV 2xday, heparin SQ Q12, Apresoline PO Q8hrs, Imdur PO daily, magnesium sulfate IV Q1hr, MVI PO daily, Vitamin B1 PO daily, Robitussin DM PO Q4hrs PRN given x 2 in last 24 hours,

## 2018-03-20 NOTE — Progress Notes (Signed)
CM met w/ pt at bedside.  A&Ox4, admitted with elevated troponin, renal insufficiency, and shortness of breath.   Pt lives w/ his son.  PTA independent w/ ADLs  And ambulatory w/o AD. Pt was evaluated by PT while in the hospital and cane was delivered at bedside.  Demographics verified.   Pt does not have a PCP or Rx coverage. He uses GoodRx cards when picks up his medications.   Pt in [facility]. Pt to call the number provided to schedule appointment w/ the intake clinic.   Pt does not drive. He takes the bus to his appointments.    DCP- home w/ son. Pt might need wheelchair Zenaida Niece if son cannot pick-up at discharge.            Garry Heater, RN MSN       Clinical Case Manager       [facility]        [address] [place], [place] [address]      [email or web address]      [phone]       03/20/18 1434   Patient Type   Within 30 Days of Previous Admission? No   Healthcare Decisions   Interviewed: Patient   Orientation/Decision Making Abilities of Patient Alert and Oriented x3, able to make decisions   Advance Directive Patient does not have advance directive   Healthcare Agent Appointed No   Prior to admission   Prior level of function Independent with ADLs;Ambulates independently   Type of Residence Private residence   Home Layout One level;Elevator   Have running water, electricity, heat, etc? Yes   Living Arrangements Children   How do you get to your MD appointments? bus,son   How do you get your groceries? son,bus   Who fixes your meals? self   Who does your laundry? self   Who picks up your prescriptions? self   Dressing Independent   Grooming Independent   Feeding Independent   Bathing Independent   Toileting Independent   DME Currently at Home Other (Comment)  (none)   Name of Prior Assisted Living Facility n/a   Home Care/Community Services None   Prior SNF admission? (Detail) n/a   Prior Rehab admission? (Detail) n/a   Adult Protective Services (APS) involved?  No   Discharge Planning   Support Systems Children   Patient expects to be discharged to: home w/ son    Anticipated [place] plan discussed with: Patient   Mode of transportation: Other  (TBD)   Does the patient have perscription coverage? No   Consults/Providers   PT Evaluation Needed 1   OT Evalulation Needed 1   SLP Evaluation Needed 2   Outcome Palliative Care Screen Screened but did not meet criteria for intervention   Correct PCP listed in Epic? No (comment)   PCP   PCP on file was verified as the current PCP? Patient/family states they do not have a PCP

## 2018-03-20 NOTE — Plan of Care (Signed)
Problem: Safety  Goal: Patient will be free from injury during hospitalization   03/20/18 0104   Goal/Interventions addressed this shift   Patient will be free from injury during hospitalization  Assess patient's risk for falls and implement fall prevention plan of care per policy;Provide and maintain safe environment;Ensure appropriate safety devices are available at the bedside;Include patient/ family/ care giver in decisions related to safety;Hourly rounding         Problem: Everyday - Heart Failure  Goal: Stable Vital Signs and Fluid Balance  Outcome: Completed Date Met: 03/20/18   03/20/18 0104   Goal/Interventions addressed this shift   Stable Vital Signs and Fluid Balance Daily Standing Weights in the morning using the same scale, after using the bathroom and before breadfast. If unable to stand, zero the bed and use the bed scale;Monitor, assess vital signs and telemetry per policy;Monitor labs and report abnormalities to physician;Assess for swelling/edema     The pt learning abilities have been assessed. Today's individual plan of care is to continue  hourly rounding, monitor vs,cardiacrythm, MNPO,safety and fall prevention were discussed with the pt during bedside report,pt verbalized agreement. Pt demonstrate understanding of disease process,treatment plan,medication and consequences of non compliance. Pt denies for chest pain, SOB and dizziness. All question and concerned were addressed. Will continue to monitor. Hourly rounding continue.

## 2018-03-20 NOTE — Progress Notes (Signed)
CM UPDATE:      Acute kidney injury with possible chronic kidney disease.  Kidney function improving while in the hospital  Systolic dysfunction with CHF/cardiomyopathy. Symptoms improving  Hypertension with chronic kidney disease with left ventricular hypertrophy    PLAN:  [place] to home with family when discharged

## 2018-03-20 NOTE — Progress Notes (Addendum)
[facility]  PROGRESS NOTE  [facility]     Date Time: 03/20/18 8:18 AM  Patient Name: Chad Rubio, Chad Rubio     Patient Active Problem List   Diagnosis   . Congestive heart failure, unspecified HF chronicity, unspecified heart failure type     Assessment:    Patient admitted 03/17/2018 with shortness of breath   Cardiomegaly with low-grade HF per CXR   BNP elevation >7K   Echocardiogram (03/18/2018) revealing markedly dilated LV with severely depressed LVSF, LVEF <15%, mild LVH, grade II diastolic dysfunction, decreased RVSF with severe pHTN, mild MR, mild AI, mild PR, severe TR, small circumferential pericardial effusion that is no hemodynamically significant.   Nuclear ST (03/19/2018) revealing a small partially reversible infero-apical defect, severe LV systolic dysfunction with LVEF 23%.   HTN   Non-sustained VT   Renal Insufficiency   Thrombocytopenia    Tobacco use    ETOH abuse     Recommendations:      Continue GDMT for HFrEF    Increase coreg to 6.25 mg BID (done)   Initiate entresto, spironolactone as able pending renal function, nephrology input   Maintain K+>= 4.0, Mg++>=2.0   Please weigh patient daily on STANDING scale and record I&O    Heart Failure Type: HFrEF and diastolic dysfunction    Acuity: acute    Last filed weight: Weight: 69.5 kg (153 lb 3.2 oz)  Weight on Admission: Weight: 80.7 kg (178 lb)    Ejection Fraction: 15%  Method & Date: echo 03/18/2018    Fluid Restriction: 1500 cc    Sodium Restriction: 2g Sodium Diet    Medication strategy:    Heart failure guideline-directed medical therapy             carvedilol (COREG) tablet 6.25 mg  Every 12 hours cardiac          hydrALAZINE (APRESOLINE) tablet 10 mg  Every 8 hours scheduled          isosorbide mononitrate (IMDUR) 24 hr tablet 30 mg  Daily          furosemide (LASIX) injection 40 mg  2 times daily at 0800 and 1400               Medication Titrations: Per recommendations    Consults:    Palliative Care:  No    Advanced Heart Failure: TBD    Electrophysiology: TBD    Pharmacy: yes    Nutrition: consult ordered 03/20/2018        Subjective:   Denies chest pain or palpitations.  Improved SOB following diuresis    Medications:      Scheduled Meds: PRN Meds:      carvedilol 3.125 mg Oral Q12H SCH   folic acid 1 mg Oral Daily   furosemide 40 mg Intravenous BID   heparin (porcine) 5,000 Units Subcutaneous Q12H SCH   hydrALAZINE 10 mg Oral Q8H SCH   isosorbide mononitrate 30 mg Oral Daily   multivitamin 1 tablet Oral Daily   thiamine 100 mg Oral Daily       Continuous Infusions:     acetaminophen 650 mg Once PRN   guaiFENesin-dextromethorphan 5 mL Q4H PRN   pneumococcal 13-val conj vacc 0.5 mL Prior to discharge         Home Meds:   Prescriptions Prior to Admission   Medication Sig Dispense Refill Last Dose   . albuterol (PROVENTIL HFA;VENTOLIN HFA) 108 (90 Base) MCG/ACT inhaler Inhale 2 puffs into the lungs  every 6 (six) hours as needed for Wheezing      . fluticasone (FLONASE) 50 MCG/ACT nasal spray 1 spray by Nasal route daily             Physical Exam:   BP 124/83   Pulse 91   Temp (!) 96.6 F (35.9 C) (Oral)   Resp 18   Ht 1.905 m (6\' 3" )   Wt 73.1 kg (161 lb 1.6 oz)   SpO2 99%   BMI 20.14 kg/m  O2 Flow Rate (L/min): 2 L/min O2 Device: None (Room air)    Temp (24hrs), Avg:97 F (36.1 C), Min:96.6 F (35.9 C), Max:97.7 F (36.5 C)      Telemetry reviewed - NSR     Intake and Output Summary (Last 24 hours) at Date Time    Intake/Output Summary (Last 24 hours) at 03/20/18 0818  Last data filed at 03/20/18 0600   Gross per 24 hour   Intake              710 ml   Output             3875 ml   Net            -3165 ml       General Appearance: no acute distress  Head:  normocephalic  Eyes:  EOM's intact  Neck:  No carotid bruit, + jugular venous distension  Lungs:  Clear to auscultation without wheezes, rhonchi or rales  Chest Wall:  No tenderness or deformity  Heart:  S1 S2 normal No S3 or S4, without murmur/rub.   PMI non displaced   Abdomen:  Soft, non-tender, positive bowel sounds  Extremities:  Trace Le edema  Pulses:  2+ pedal, radial, brachial pulses equal bilateraly  Neurologic:  Alert and oriented x3, mood and affect normal  Musculoskeletal: normal strength and tone    Labs:     Recent Labs  Lab 03/18/18  0231 03/17/18  2303 03/17/18  1700   Troponin I 0.13* 0.15* 0.14*       Recent Labs  Lab 03/20/18  0435   Bilirubin, Total 2.1*   Bilirubin, Direct 1.3*   Protein, Total 6.2   Albumin 3.1*   ALT 42   AST (SGOT) 54*       Recent Labs  Lab 03/20/18  0435   Magnesium 1.6           Recent Labs  Lab 03/20/18  0435 03/18/18  0231 03/17/18  1700   WBC 6.90 5.24 4.67   Hgb 13.1 14.6 15.5   Hematocrit 41.0 46.6 49.1   Platelets 90* 107* 112*       Recent Labs  Lab 03/20/18  0435 03/18/18  0231 03/17/18  1700   Sodium 141 139 139   Potassium 3.9 5.1 4.6   Chloride 102 106 107   CO2 26 18* 18*   BUN 34.0* 33.0* 31.0*   Creatinine 1.7* 2.0* 2.1*   EGFR 48.9 40.5 38.3   Glucose 101* 114* 110*   Calcium 8.6 9.4 9.7       Lab Results   Component Value Date    BNP 7,289 (H) 03/17/2018       Weight Monitoring 03/17/2018 03/19/2018   Height 190.5 cm -   Height Method Stated -   Weight 80.74 kg 73.074 kg   Weight Method Stated Standing Scale   BMI (calculated) 22.3 kg/m2 -         Imaging:  ECHO 03/18/2018  [facility]   Echocardiogram Adult Complete W Clr/ Dopp Waveform   Order# [id number]   Reading physician: Montey Hora, MD Ordering physician: Marya Amsler, MD Study date: 03/18/18   PACS Images     Show images for Echocardiogram Adult Complete W Clr/ Dopp Waveform   CARDIAC PROCEDURE REPORT     View Image   Patient Information     Name MRN Description   Chad Rubio [id number] 67 y.o. Male   Study Result     Name:     Chad Rubio  Age:     67 years  DOB:     04-01-1951  Gender:     Male  MRN:     [id number]  Wt:     178 lb  BSA:     2.06 m2  HR:     98 bpm  Systolic BP:     387 mmHg  Diastolic BP:     564  mmHg  Technical Quality:     Good  Exam Date/Time:     03/18/2018 8:22 AM  Exam Type:     ECHOCARDIOGRAM ADULT COMPLETE W CLR/ DOPP WAVEFORM    Staff  Sonographer:     Bubba Camp,  RDCS  Ordering Physician:     Elam Dutch    Study Info  Indications       - new onset chf  Procedure    Complete two-dimensional, color flow and spectral Doppler transthoracic  echocardiogram is performed.    75 in      History/Risk Factors  Hypertension:     Yes  Hyperlipidemia:     Yes  Tobacco Use:     Yes    Additional Patient History    Increased SOB, CHF, increased troponin and renal failure. No prior  echocardiogram history available for comparison.      Summary    * The left atrium is severely dilated.    * Left ventricular ejection fraction is severely decreased with an estimated  ejection fraction of <15%.    * The right atrium is severely dilated.    * The left ventricle is markedly  dilated with mild concentric left  ventricular hypertophy and severe global left ventricular hypokinesis.  Visually estimated LVEF is <15%.    * Left ventricular diastolic filling parameters are consistent with Grade II  diastolic dysfunction (pseudonormal pattern) with elevated left atrial  pressure.    * The right ventricle is dilated with  depressed right ventricular systolic  function. .    * ortic valve is trileaflet with no aortic stenosis and mild aortic  insufficiency. .    * There is mild mitral regurgitation.    * There is severe tricuspid regurgitation with severe pulmonary  hypertension.  Estimated right ventricular systolic pressure is 53 mmHg. .    * There is mild pulmonic regurgitation.    * Small circumferential pericardial effusion visualized which is not  hemodynamically significant. .    * No thrombi, vegetations or other abnormal intracardiac masses.      Findings  Left Ventricle    The left ventricle is markedly  dilated with mild concentric left  ventricular hypertophy and severe global left ventricular  hypokinesis.  Visually estimated LVEF is <15%.    Left ventricular wall thickness is mildly increased.    Left ventricular ejection fraction is severely decreased with an estimated  ejection fraction of <15%.    Left ventricular  segmental wall motion is normal.    Left ventricular diastolic filling parameters are consistent with Grade II  diastolic dysfunction (pseudonormal pattern) with elevated left atrial  pressure.  Right Ventricle    The right ventricle is dilated with  depressed right ventricular systolic  function. .    Decreased right ventricular systolic function.      Left Atrium    The left atrium is severely dilated.    Right Atrium    The right atrium is severely dilated.    Atrial Septum    No evidence of interatrial shunt by color Doppler.      Aortic Valve    The aortic valve is tricuspid.    ortic valve is trileaflet with no aortic stenosis and mild aortic  insufficiency. .    There is mild aortic regurgitation.    Pulmonary Valve    The pulmonic valve is structurally normal.    There is mild pulmonic regurgitation.    Mitral Valve    The mitral valve is structurally normal.    There is mild mitral regurgitation.    Tricuspid Valve    The tricuspid valve is structurally normal.    There is severe tricuspid regurgitation with severe pulmonary hypertension.  Estimated right ventricular systolic pressure is 53 mmHg. Marland Kitchen    Severe pulmonary hypertension with estimated right ventricular systolic  pressure of 63 mmHg.      Pericardium / Pleural Effusion    Small circumferential pericardial effusion visualized which is not  hemodynamically significant. .    Inferior Vena Cava    The IVC is dilated with < 50% respiratory variance consistent with  significantly elevated RA pressure of 15 mmHg.    Aorta    The aortic root is normal in size.    The ascending aorta is normal in size.      Measurements  2D Measurements  ----------------------------------------------------------------------  Name                                  Value        Normal  ----------------------------------------------------------------------    Parasternal 2D  ----------------------------------------------------------------------  RVID Diastole (2D)                 4.85 cm     2.50-3.50   IVS Diastolic Thickness (2D)       1.61 cm     0.60-1.00   LVID Diastole (2D)                 6.55 cm     4.20-5.80   LVIW Diastolic Thickness  (2D)                               1.06 cm     0.60-1.05   LVID Systole (2D)                  6.26 cm     2.50-4.00     LV Ejection Fraction 2D  ----------------------------------------------------------------------  LV EF (2D Teichholz)                  10 %         52-72     Apical 2D Dimensions  ----------------------------------------------------------------------  LA Volume Index (BP A-L)       60.29 ml/m2   16.00-34.00  M-mode Measurements  ----------------------------------------------------------------------  Name                                 Value        Normal  ----------------------------------------------------------------------    M-Mode  ----------------------------------------------------------------------  Ao Root Diameter (MM)              3.44 cm                 LA Dimension (MM)                  5.09 cm     3.00-4.10   AV Cusp Sep (MM)                   2.08 cm                 TAPSE                              0.89 cm        >=1.60  LVOT/Aortic Valve Doppler Measurements  ----------------------------------------------------------------------  Name                                 Value        Normal  ----------------------------------------------------------------------    LVOT Doppler  ----------------------------------------------------------------------  LVOT Peak Velocity                0.80 m/s                   AoV Doppler  ----------------------------------------------------------------------  AV Peak Velocity                  0.69 m/s                 AV Peak Gradient                  1.90 mmHg                 AV V1/V2 Ratio                        1.17                   AoV Regurgitation Doppler  ----------------------------------------------------------------------  AR PHT                              996 ms  RVOT/Pulmonic Valve Doppler Measurements  ----------------------------------------------------------------------  Name                                 Value        Normal  ----------------------------------------------------------------------    PV Doppler  ----------------------------------------------------------------------  PV Peak Velocity                  0.61 m/s  Mitral Valve Measurements  ----------------------------------------------------------------------  Name                                 Value        Normal  ----------------------------------------------------------------------    MV Doppler  ----------------------------------------------------------------------  MV  E Peak Velocity                0.74 m/s                 MV A Peak Velocity                0.45 m/s                 MV E/A                                1.65                 MV Pressure Half Time                41 ms                   MV Annular TDI  ----------------------------------------------------------------------  MV Septal e' Velocity             0.03 m/s        >=0.08   MV E/e' (Septal)                     24.71        <=8.00   MV Lateral e' Velocity            0.06 m/s        >=0.10   MV E/e' (Lateral)                    12.35        <=8.00  Tricuspid Valve Measurements  ----------------------------------------------------------------------  Name                                 Value        Normal  ----------------------------------------------------------------------    TV Doppler  ----------------------------------------------------------------------  TV E Peak Velocity                0.54 m/s                   TV Regurgitation  Doppler  ----------------------------------------------------------------------  TR Peak Velocity                  3.46 m/s                 TR Peak Gradient                   48 mmHg                 RA Pressure                        15 mmHg           <=3   RV Systolic Pressure               63 mmHg           <36  Aorta / Venous Measurements  ----------------------------------------------------------------------  Name                                 Value        Normal  ----------------------------------------------------------------------    IVC/SVC  ----------------------------------------------------------------------  IVC  Diameter (Exp 2D)              2.49 cm        <=2.10      Report Signatures  Finalized OZ:[id number]  Tyriq Moragne on 03/18/2018 6:58:05 PM  Promoted IO:[id number]  [place] on 03/18/2018 9:32:50 AM     NUCLEAR ST 03/19/2018  Study Result                                                                                           [facility]                                                                                   [address]                                                                                   [place], [place] [address]                                                                                   [phone]      Myocardial Perfusion Report      Chad Rubio, Chad Rubio                  Exam Date: 03/19/2018 09:13          Ordering Phys: Montey Hora    MRN:  [id number]       Gender: M    Exam Location: [facility] [place]       Referring Phys:   Age:  2   DOB:  1950-11-20       Ht (in):75  Wt (lb):161 BSA  1.95    PCP:           Earlie Raveling S                                                              :   Nurse / ECG Tech:  Haimanot Gulilat  Nuclear Technologist:Keefe, Meg CNMT   Indications:     CHEST PAIN, ACUTE CORONARY SYNDROME SUSPECTED; 67 yo with recent onset CHF.   Procedure Performed:Pharmacological Stress and Rest Myocardial  Perfusion SPECT Imaging with LV function analysis.   Patient History:  Hypertension, , Hyperlipidemia, Diabetes mellitus      IMPRESSIONS   Severe left ventricular dysfunction and calculated LVEF 23%.    Abnormal stress and rest myocardial perfusion study with  a small partially reversible defect in the infero-apical segment of the left    ventricle   Somewhat suboptimal study due to extensive patient motion                STRESS TEST     Protocol: Lexiscan   Duration (m:s):15 secsDose: 0.4mg  regadenoson given 30 seconds prior to stress radionuclide   Pharmacologic Administration Site:Intravenous Left     Administered ZO:[id number] Gulilat      Resting HR (bpm):  94    Resting BP (mmHg):143  /98        MPHR:   153      Target HR:     130   Peak HR (bpm):     103   Peak BP (mmHg):   143  /98      % MPHR:   67       Double Product:14729   BP Response:       Normal hemodynamic response to Lexiscan.   Termination Reason:Lexiscan protocol completed.    ECG ANALYSIS         Stress Test Interpreted UJ:[id number] Lucianne Muss   The above interpretation is as per the stressing physician who was physically present and supervised and interpreted the study.   IMAGE PROTOCOL       Rest/Stress 1 Day         Stress radionuclide dose injected at peak stress.RadiopharDose (mCi)ticalAdministration SiteAdministered by   Rest:   Tc-23m Tetrofosmin   11.0         IV - left hand            Elder Cyphers CNMT   Stress: Tc-30m Tetrofosmin   32.0         IV - left hand            Elder Cyphers CNMT              Imaging  Date & Time Inj to Img Time (min)Camera Used                           Position   Rest:   19-Mar-2018          30                   Siemens Symbia                        Supine   Stress: 19-Mar-2018          45                   Siemens Symbia                        Supine   SPECT RESULTS         Technical Quality:  Adequate   Image Corrections:  Patient motion artifact - motion correction applied   PERFUSION FINDINGS  FUNCTIONAL RESULTS     (calculated via Gated SPECT)    Post Stress LV EF (%): 23    Stress EDV (mL):240           EDVI (ml/m??? ):123    Stress ESV (mL):186           ESVI (ml/m??? ):95         FUNCTIONAL FINDINGS:   Global hypokinesis        Nitin Lucianne Muss     Probation officer Signed)     Final Date:19 March 2018 14:28              Signed by: Jenel Lucks, NP       Patient seen and examined, my assessment and plan as above.  Jovonne Wilton P. Letitia Neri MD      [facility]  NP Spectralink [phone] (8am-5pm)  MD Spectralink [phone] (8am-5pm)  After hours, non urgent consult line [phone]  After Hours, urgent consults [phone]

## 2018-03-20 NOTE — Progress Notes (Signed)
Bladder Scan done, shows 244 ml.

## 2018-03-20 NOTE — Plan of Care (Signed)
Problem: Physical Therapy  Goal: By discharge, patient will perform mobility at the patient's highest functional potential. See PT evaluation/note for goals.  Outcome: Adequate for Discharge  Discharge Recommendation: Home with no needs  DME Recommended for Discharge: Single point cane (Given to patient)    Is an Occupational Therapy Evaluation Indicated at this time? This patient is already on OT caseload.     Treatment/Interventions: Investment banker, operational, Museum/gallery curator, Cabin crew, Equipment eval/education  PT Frequency: one time visit - therapy discontinued     Next Visit Recommendation:       PMP Activity: Step 7 - Walks out of Room  Distance Walked (ft) (Step 6,7): 75 Feet  (Please See Therapy Evaluation for device and assistance level needed)    Goals:

## 2018-03-20 NOTE — Plan of Care (Signed)
Problem: Addiction to alcohol or opioids or other substances AS EVIDENCED BY:  Goal: Patient achieves a safe detoxification and management of withdrawal symptoms  Outcome: Progressing   03/20/18 1856   Goal/Interventions addressed this shift   Patient achieves a safe detoxification and management of withdrawal symptoms Assess withdrawal signs/symptoms according to identified protocol (e.g. CIWA/COWS);Provide medication teaching including name, dosage, benefits, action, effect and side effects;Ensure laboratory results are reviewed with the LIP to increase understanding of the medical consequences of addiction;Educate about health risks associated with withdrawal (e.g. seizures, DTs);Assess effectiveness (relief of withdrawal symptoms) and side effects of medication;Ensure adequate hydration and nutrition during withdrawal;Educate about the importance of reporting dizziness or unsteadiness while ambulating to care providers (e.g. RN, LIP)       Comments: Education was provided on the importance of alcohol cessation. Will continue to monitor for seizures.

## 2018-03-20 NOTE — Progress Notes (Signed)
HEART FAILURE EDUCATION:      History:  Per Dr. Letitia Neri HPI:  Chad Rubio is a 67 y.o. male with history of hypertension but no other known heart disease admitted from the emergency room where he presented last night complaining of a 2 to 67-month history of progressive weakness, shortness of breath, decreased appetite and what he describes as "panic attacks" which he means he experiences sudden onset of shortness of breath.  [name] reports a 5 to 7 pound weight loss over the period of time he has recently noted some edema in his lower extremities.  Test x-ray in the emergency room was consistent with [facility].BNP level on admission was elevated to 7289 serial troponins are minimally elevated to 0.15.  On 03/17/2018.  We have been asked by [name], MD,  to provide cardiac consultation, regarding further evaluation and management.    Chad Rubio denies chest pain is no history of prior myocardial infarction.  He has not previously been diagnosed with congestive heart failure.  Running risk factors for heart disease there is a history of medically treated hypertension.  He reports having been told he has "borderline" diabetes.  There is no known history of hyperlipidemia.  [name] has smoked 1 pack of cigarettes per day since he was a teenager.  He admits to drinking mostly on weekends up to 1/5 of alcohol on weekend days.  He says that he has not had anything to any alcohol to drink since the onset of his current symptoms in early June.  No cough.  Reports a family history of congestive heart failure    Assessment:  Per Dr. Letitia Neri:   Patient admitted 03/17/2018 with shortness of breath  ? Cardiomegaly with low-grade HF per CXR  ? BNP elevation >7K   Echocardiogram (03/18/2018) revealing markedly dilated LV with severely depressed LVSF, LVEF <15%, mild LVH, grade II diastolic dysfunction, decreased RVSF with severe pHTN, mild MR, mild AI, mild PR, severe TR, small circumferential pericardial effusion  that is no hemodynamically significant.   Nuclear ST (03/19/2018) revealing a small partially reversible infero-apical defect, severe LV systolic dysfunction with LVEF 23%.   HTN   Non-sustained VT   Renal Insufficiency   Thrombocytopenia    Tobacco use    ETOH abuse     Prior to Admission.  Daily weights: No  Fluid Limitation: No  Sodium Restriction: No  Exercise: as tolerated  Smoking: current  Understands HF: needs reinforcement    Patient Education;  CHF Folder: Yes  GWN: assigned  HF Zones: Yes  Daily Weights: Yes  Fluid Restriction: Yes, 1500 mL  Medication Education:   Furosemide   Hydralazine   carvedilol  Nutrition Education: low sodium diet, 2g NA restriction   Heart healthy nutrition handout   Most common high sodium foods handout   Salt alternatives handout  90 Day Commitment to Care Treatment Plan: Yes  Cardiac Rehab: Yes    Clinical Management:  LV Function Assessed: <15%  Daily weights ordered: Yes  Intake and Output ordered: Yes  PT/OT ordered: patient independent  Disposition: Home    Summary:  Chad Rubio was agreeable to speaking with me about heart failure. He is being followed by [facility] and they will arrange follow-up at discharge. His heart failure diagnosis is new to him. We discussed daily weights, low sodium diet, fluid restriction, and the CHF warning zones. He verbalized understanding. I have also provided him with an Entresto booklet in case he is prescribed  Entresto in the future. I will continue to follow along in his care.       Purcell Nails BSN, RN-BC  Heart Failure and Pre-Op Open Heart Navigator  [facility]  P: [phone]

## 2018-03-20 NOTE — Plan of Care (Addendum)
Problem: Safety  Goal: Patient will be free from injury during hospitalization  Outcome: Progressing   03/20/18 1112   Goal/Interventions addressed this shift   Patient will be free from injury during hospitalization  Assess patient's risk for falls and implement fall prevention plan of care per policy;Provide and maintain safe environment;Use appropriate transfer methods;Ensure appropriate safety devices are available at the bedside;Include patient/ family/ care giver in decisions related to safety;Hourly rounding;Assess for patients risk for elopement and implement Elopement Risk Plan per policy;Provide alternative method of communication if needed (communication boards, writing)     Goal: Patient will be free from infection during hospitalization  Outcome: Progressing   03/20/18 1112   Goal/Interventions addressed this shift   Free from Infection during hospitalization Assess and monitor for signs and symptoms of infection;Monitor lab/diagnostic results       Problem: Pain  Goal: Pain at adequate level as identified by patient  Outcome: Progressing   03/20/18 1112   Goal/Interventions addressed this shift   Pain at adequate level as identified by patient Identify patient comfort function goal;Assess for risk of opioid induced respiratory depression, including snoring/sleep apnea. Alert healthcare team of risk factors identified.;Assess pain on admission, during daily assessment and/or before any "as needed" intervention(s);Reassess pain within 30-60 minutes of any procedure/intervention, per Pain Assessment, Intervention, Reassessment (AIR) Cycle;Evaluate if patient comfort function goal is met;Offer non-pharmacological pain management interventions;Evaluate patient's satisfaction with pain management progress;Consult/collaborate with Pain Service       Problem: Everyday - Heart Failure  Goal: Stable Vital Signs and Fluid Balance  Outcome: Progressing   03/20/18 1112   Goal/Interventions addressed this shift    Stable Vital Signs and Fluid Balance Daily Standing Weights in the morning using the same scale, after using the bathroom and before breadfast. If unable to stand, zero the bed and use the bed scale;Monitor, assess vital signs and telemetry per policy;Assess for swelling/edema;Wean oxygen as needed if appropriate;Monitor labs and report abnormalities to physician;Strict Intake/Output;Fluid Restriction     Goal: Mobility/Activity is Maintained at Optimal Level for Patient  Outcome: Progressing   03/20/18 1112   Goal/Interventions addressed this shift   Mobility/Activity is Maintained at Optimal Level for Patient Increase mobility as tolerated/progressive mobility protocol;Perform active/passive ROM;Reposition patient every 2 hours and as needed unless able to reposition self;Maintain SCD's as Ordered;Assess for changes in respiratory status, level of consciousness and/or development of fatigue;Consult with Physical Therapy and/or Occupational Therapy       Problem: Renal Instability  Goal: Fluid and electrolyte balance are achieved/maintained  Outcome: Progressing   03/20/18 1112   Goal/Interventions addressed this shift   Fluid and electrolyte balance are achieved/maintained  Monitor intake and output every shift;Monitor/assess lab values and report abnormal values;Provide adequate hydration;Monitor daily weight;Assess for confusion/personality changes;Assess and reassess fluid and electrolyte status;Observe for seizure activity and initiate seizure precautions if indicated;Follow fluid restrictions/IV/PO parameters;Observe for cardiac arrhythmias;Monitor for muscle weakness       Problem: Hemodynamic Status: Cardiac  Goal: Stable vital signs and fluid balance  Outcome: Progressing   03/20/18 1112   Goal/Interventions addressed this shift   Stable vital signs and fluid balance Monitor/assess vital signs and telemetry per unit protocol;Weigh on admission and record weight daily;Assess signs and symptoms associated  with cardiac rhythm changes;Monitor intake/output per unit protocol and/or LIP order;Monitor lab values;Monitor for leg swelling/edema and report to LIP if abnormal       Comments: Pt is A&O x4. Vital signs are stable. Pt denies any chest pain,  SOB, dizziness. Pt is NSR on the monitor, room air. Pt tolerates diet well--good output. Pt ambulates in the room without difficulty using a cane.  CHF education was provided--Pt verbalized understanding, intake and output is being measured. The learning abilities of the Pt have been assessed. Today's individualized plan of care includes VS monitoring, pain management, IV Mg, IV Lasix, adequate nutrition, CHF education, daily weights, intake and output, BBs, hourly roundings. The Pt agreed to the plan of care and demonstrated understanding of the disease process, treatment plan, medications, and consequences of noncompliance. All the questions and concerns were addressed.

## 2018-03-20 NOTE — Progress Notes (Signed)
PROGRESS NOTE    Hospital Day: 2    Assessment:     Acute kidney injury with possible chronic kidney disease.  Kidney function improving while in the hospital  Systolic dysfunction with CHF/cardiomyopathy.  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  Sitting comfortably chair.  No nausea vomiting diarrhea no chest pain or shortness of breath.  Feeling much better    Review of Systems:   General:  no fever, no chills, no rigor, awake and alert, feeling better   HEENT: no neck pain, no throat pain  Endocrine: no fatigue   Respiratory: no cough, shortness of breath, or wheezing   Cardiovascular: no chest pain   Gastrointestinal: no abdominal pain,no N/V/D  Musculoskeletal: no edema  Neurological: no focal weakness  Dermatological: no rash, no ulcer    Physical Exam:     Vitals:    03/20/18 1421   BP: 102/63   Pulse:    Resp:    Temp:    SpO2:        Intake and Output Summary (Last 24 hours) at Date Time    Intake/Output Summary (Last 24 hours) at 03/20/18 1521  Last data filed at 03/20/18 1416   Gross per 24 hour   Intake              650 ml   Output             5025 ml   Net            -4375 ml         General: No acute distress.  HEENT: No pallor.  Neck: No jugular venous distension.  Chest: Clear to auscultation.   CVS: Regular rate and rhythm. Normal S1S2. No murmur or rub.  Abdomen: Nontender, non distended. Positive bowel sounds.  No lower extremity edema.  No rash on limbs.     Scheduled Meds: Continuous Infusions:      carvedilol 6.25 mg Oral Q12H [facility]   folic acid 1 mg Oral Daily   furosemide 40 mg Intravenous BID   heparin (porcine) 5,000 Units Subcutaneous Q12H SCH   hydrALAZINE 10 mg Oral Q8H SCH   isosorbide  mononitrate 30 mg Oral Daily   multivitamin 1 tablet Oral Daily   thiamine 100 mg Oral Daily               Labs:       Recent Labs  Lab 03/20/18  0435 03/19/18  0400 03/18/18  0231   WBC 6.90 6.60 5.24   Hgb 13.1 14.3 14.6   Hematocrit 41.0 46.3 46.6   Platelets 90* 100* 107*       Recent Labs  Lab 03/20/18  0435 03/18/18  0231 03/17/18  1700   Sodium 141 139 139   Potassium 3.9 5.1 4.6   Chloride 102 106 107   CO2 26 18* 18*   BUN 34.0* 33.0* 31.0*   Creatinine 1.7* 2.0* 2.1*   EGFR 48.9 40.5 38.3   Glucose 101* 114* 110*   Calcium 8.6 9.4 9.7   Albumin 3.1*  --  3.9         Urine Type  Date Value Ref Range Status   03/18/2018 Clean Catch  Final     Color, UA   Date Value Ref Range Status   03/18/2018 Yellow Colorless - Yellow Final     Clarity, UA   Date Value Ref Range Status   03/18/2018 Clear Clear - Hazy Final     Specific Gravity UA   Date Value Ref Range Status   03/18/2018 1.008 1.001 - 1.035 Final     Urine pH   Date Value Ref Range Status   03/18/2018 6.0 5.0 - 8.0 Final     Nitrite, UA   Date Value Ref Range Status   03/18/2018 Negative Negative Final     Ketones UA   Date Value Ref Range Status   03/18/2018 Negative Negative Final     Urobilinogen, UA   Date Value Ref Range Status   03/18/2018 Negative 0.2 - 2.0 mg/dL Final     Bilirubin, UA   Date Value Ref Range Status   03/18/2018 Negative Negative Final     Blood, UA   Date Value Ref Range Status   03/18/2018 Negative Negative Final     RBC, UA   Date Value Ref Range Status   03/18/2018 0 - 2 0 - 5 /hpf Final     WBC, UA   Date Value Ref Range Status   03/18/2018 6 - 10 (A) 0 - 5 /hpf Final       Rads:     Radiology Results (24 Hour)     ** No results found for the last 24 hours. **        Radiological Procedure reviewed.          Cristal Ford, MD  03/20/2018

## 2018-03-20 NOTE — PT Eval Note (Signed)
[facility]  Physical Therapy Evaluation and D/C     Patient: Chad Rubio  MRN#: [id number]  Unit: [facility]  Bed: V4098/J1914-N    Time of Evaluation:  Time Calculation  PT Received On: 03/20/18  Start Time: 8295  Stop Time: 0851  Time Calculation (min): 16 min    Chart Review and Collaboration with Care Team: 5 minutes, not included in above time.    PT Visit Number: 1    Consult received for Chad Rubio for PT Evaluation and Treatment.  Patient's medical condition is appropriate for Physical therapy intervention at this time.    Activity Orders:  PT eval and treat and progressive mobility protocol    Precautions and Contraindications:  Precautions  Precaution Instructions Given to Patient: Yes  Other Precautions: EF <15%, Fluid restriction    Medical Diagnosis:  Renal insufficiency [N28.9]  Elevated troponin [R74.8]  Congestive heart failure, unspecified HF chronicity, unspecified heart failure type [I50.9]    History of Present Illness:  Chad Rubio is a 67 y.o. male admitted on 03/17/2018 who presented with ato 59-month history of progressive weakness, shortness of breath.  Dx: cardiomegaly with low grade CHF.    Patient Active Problem List   Diagnosis   . Congestive heart failure, unspecified HF chronicity, unspecified heart failure type       Past Medical/Surgical History:  Past Medical History:   Diagnosis Date   . Hypertension      History reviewed. No pertinent surgical history.    X-Rays/Tests/Labs:  Lab Results   Component Value Date/Time    HGB 13.1 03/20/2018 04:35 AM    HCT 41.0 03/20/2018 04:35 AM    K 3.9 03/20/2018 04:35 AM    NA 141 03/20/2018 04:35 AM    TROPI 0.13 (H) 03/18/2018 02:31 AM    TROPI 0.15 (H) 03/17/2018 11:03 PM    TROPI 0.14 (H) 03/17/2018 05:00 PM       All imaging reviewed, please see chart for details.    Social History:  Prior Level of Function  Prior level of function: Independent with ADLs, Ambulates independently  Baseline Activity Level:  Community ambulation  Driving: independent  Cooking: Yes  Employment: Retired  DME Currently at [employer]: [employer] Living Arrangements  Living Arrangements: Friends, Children  Type of Home: Apartment  Home Layout: One level, Occupational psychologist Shower/Tub: Medical sales representative: Raised  Bathroom Equipment: Grab bars in shower, Grab bars around toilet, Hand-held shower  Bathroom Accessibility: Accessible via walker  DME Currently at Home: Grab bars    Subjective:  Patient is agreeable to participation in the therapy session. Nursing clears patient for therapy.  Patient Goal: Get all these little things taken care of  Pain Assessment  Pain Assessment: Numeric Scale (0-10)  Pain Score: 3-mild pain  POSS Score: Awake and Alert  Pain Location: Head (mild headache)  Pain Intervention(s): Medication (See eMAR);Repositioned;Ambulation/increased activity      Objective:  Observation of Patient/Vital Signs:  Vitals:    03/20/18 0827   BP: 114/83   Pulse: 82   Resp:    Temp:    SpO2:          Patient received in bed with No Medical Equipment in place.         Cognitive Status and Neuro Exam:  Cognition/Neuro Status  Arousal/Alertness: Appropriate responses to stimuli  Attention Span: Appears intact  Orientation Level: Oriented X4  Memory: Appears  intact  Following Commands: Follows all commands and directions without difficulty  Safety Awareness: minimal verbal instruction  Insights: Fully aware of deficits    Musculoskeletal Examination  Gross ROM  Right Lower Extremity ROM: within functional limits  Left Lower Extremity ROM: within functional limits  Gross Strength  Right Lower Extremity Strength: within functional limits  Left Lower Extremity Strength: within functional limits       Functional Mobility:  Functional Mobility  Rolling: Independent  Supine to Sit: Independent  Sit to Supine: Independent  Sit to Stand: Independent  Stand to Sit: Independent     Locomotion  Ambulation: Modified Independent;with  single point cane  Pattern: within functional limits;decreased cadence     Balance  Balance  Balance: within functional limits (with SPC)    Participation and Activity Tolerance  Participation and Endurance  Participation Effort: excellent  Endurance: Endurance does not limit participation in activity    Educated the patient to role of physical therapy, plan of care, goals of therapy and HEP, safety with mobility and ADLs, discharge instructions, home safety.  Importance of OOB in chair throughout the day and multiple short walks.    Pt fit with SPC and instructed in its use.  Able to amb Indep with SPC 75' in hallway.    Patient left in bed with  call bell and all personal items/needs within reach. RN notified of session outcome.      Assessment:  Chad Rubio is a 67 y.o. male admitted 03/17/2018. Pt would benefit from Physical Therapy to address deficits and increase functional independence for safe ambulation. There are few comorbidities or other factors that affect plan of care and require modification of task including:CHF.  Pt's functional mobility is impacted by: balance.  Standardized tests and exams incorporated into evaluation include AMPAC mobility, balance, ROM  and Strength.   Pt demonstrates a stable clinical presentation.      Complexity Level Hx and Co-  morbidites Examination Clinical Decision Making Clinical Presentation   Low no impact 1-2 elements Limited options Stable       Plan:  D/C inpatient PT    PMP Activity: Step 7 - Walks out of Room  Distance Walked (ft) (Step 6,7): 75 Feet      AM-PAC:yes        PT Basic Mobility Raw Score: 24  CMS 0-100% Score: 0.00%               Goals:  N/A        DME Recommended for Discharge: Single point cane (Given to patient)  Discharge Recommendation: Home with no needs    Norval Gable, PT [phone]  Manager Clinical Rehabilitation   03/20/2018 9:22 AM

## 2018-03-20 NOTE — Progress Notes (Signed)
[facility]  PROGRESS NOTE      Patient: Chad Rubio  Date: 03/20/2018   LOS: 2 Days  Admission Date: 03/17/2018   MRN: [id number]  Attending: [name]  Please contact me on the following pager [phone]       ASSESSMENT/PLAN     Chad Rubio is a 67 y.o. male admitted with acute congestive heart failure    Interval Summary:     Patient Active Hospital Problem List:  1. New onset acute combined systolic/diastolic CHF: (SOB-rest and exertional, fatigue, orthopnea, JVD, + CXR, BNP 7289) ejection fraction less than 15% with grade 2 diastolic dysfunction  2. HTN  3. ARF: Cr 2.1 POA. Unsure baseline.  4. Abnormal EKG/abnormal nuclear stress test  5. Binge drinking: 1 pint on weekend  6. LLE swelling  7.  NSVT      -Reviewed CXR; showing cardiomegaly with bilateral pleural effusion.  -Reviewed EKG: Normal sinus rhythm.  T-wave inversion in lateral leads.  Prolonged QTC.  -Status post echocardiogram showing severely reduced left ventricular systolic function with ejection fraction of less than 15%.  Patient also had grade 2 diastolic dysfunction.  Severely dilated right atrium mild concentric left ventricular hypertrophy and severe global left ventricular hypokinesis.  Severe tricuspid regurgitation, mild mitral regurgitation, severe pulmonary hypertension mild aortic insufficiency    Status post nuclear stress test showing abnormal stress test and rest myocardial perfusion study with small partially reversible defect in the inferior apical segment of the left ventricle    -Continue with IV Lasix with recheck electrolytes and renal functions. Strict I&Os, daily weight, water restriction, low salt diet and cardiac rehab on discharge.   -Increase patient's Coreg to 6.25 mg twice daily as per cardiology recommendation  -Consider adding Entresto, spironolactone as tolerated once cleared by nephrology  -Nephrology on board    -Alcohol cessation education. Multivitamins.  Thiamine and folate.  Last drink was on  weekend.      Nutrition  2gms Na diet with fluid restriction.    DVT/VTE Prophylaxis  SCD/Heparin.               Code Status: Full    DISPO: To be determined             SUBJECTIVE     Chad Rubio states that he feels much better denies any new complaints    MEDICATIONS     Current Facility-Administered Medications   Medication Dose Route Frequency   . carvedilol  6.25 mg Oral Q12H SCH   . folic acid  1 mg Oral Daily   . furosemide  40 mg Intravenous BID   . heparin (porcine)  5,000 Units Subcutaneous Q12H [address] [facility]   . hydrALAZINE  10 mg Oral Q8H SCH   . isosorbide mononitrate  30 mg Oral Daily   . multivitamin  1 tablet Oral Daily   . thiamine  100 mg Oral Daily       PHYSICAL EXAM     Vitals:    03/20/18 1630   BP: 100/67   Pulse: 86   Resp: 18   Temp: (!) 96.4 F (35.8 C)   SpO2: 95%       Temperature: Temp  Min: 96.4 F (35.8 C)  Max: 97.7 F (36.5 C)  Pulse: Pulse  Min: 76  Max: 94  Respiratory: Resp  Min: 17  Max: 18  Non-Invasive BP: BP  Min: 100/67  Max: 124/83  Pulse Oximetry SpO2  Min: 95 %  Max: 99 %    Intake and Output Summary (Last 24 hours) at Date Time    Intake/Output Summary (Last 24 hours) at 03/20/18 1736  Last data filed at 03/20/18 1630   Gross per 24 hour   Intake              850 ml   Output             4975 ml   Net            -4125 ml         GEN APPEARANCE: Normal;  A&OX3  HEENT: PERLA; EOMI; Conjunctiva Clear  NECK: Supple; No bruits  CVS: RRR, S1, S2; No M/G/R  LUNGS: CTAB; No Wheezes; No Rhonchi: Improved crackles   ABD: Soft; No TTP; + Normoactive BS  EXT: 2+ edema; Pulses 2+ and intact  Skin exam:  no pallor  NEURO: CN 2-12 intact; No Focal neurological deficits  CAP REFILL:  Normal  MENTAL STATUS:  Normal          LABS       Recent Labs  Lab 03/20/18  0435 03/19/18  0400 03/18/18  0231   WBC 6.90 6.60 5.24   RBC 4.53 4.97 5.05   Hgb 13.1 14.3 14.6   Hematocrit 41.0 46.3 46.6   MCV 90.5 93.2 92.3   Platelets 90* 100* 107*         Recent Labs  Lab 03/20/18  0435 03/19/18  0400  03/18/18  0231 03/17/18  1700   Sodium 141 141 139 139   Potassium 3.9 5.0 5.1 4.6   Chloride 102 103 106 107   CO2 26 24 18* 18*   BUN 34.0* 39.0* 33.0* 31.0*   Creatinine 1.7* 2.0* 2.0* 2.1*   Glucose 101* 96 114* 110*   Calcium 8.6 9.3 9.4 9.7   Magnesium 1.6 2.0 1.9  --          Recent Labs  Lab 03/20/18  0435 03/19/18  0400 03/17/18  1700   ALT 42 62* 39   AST (SGOT) 54* 90* 68*   Bilirubin, Total 2.1* 1.8* 2.1*   Bilirubin, Direct 1.3*  --  1.5*   Albumin 3.1* 3.5 3.9   Alkaline Phosphatase 54 62 65         Recent Labs  Lab 03/18/18  0231 03/17/18  2303 03/17/18  1700   Troponin I 0.13* 0.15* 0.14*             Microbiology Results     None           RADIOLOGY     US Renal Kidney    Result Date: 03/18/2018   Post void residual volume of 96 cc. Fonnie Mu, MD 03/18/2018 8:48 PM    Chest Ap Portable    Result Date: 03/17/2018  1. Cardiomegaly with low-grade CHF. There is mild hypoventilation in the lung bases. Charlene Brooke, MD 03/17/2018 5:17 PM    [place] Venodopp Low Extremity Bilateral    Result Date: 03/18/2018      Normal venous duplex of the lower extremities.  No evidence of intraluminal thrombus or obstruction to venous flow in right or left lower extremity. Denna Haggard, MD 03/18/2018 8:57 AM      Signed,  [name]  5:36 PM 03/20/2018

## 2018-03-21 DIAGNOSIS — I4729 Other ventricular tachycardia: Secondary | ICD-10-CM | POA: Diagnosis present

## 2018-03-21 DIAGNOSIS — F1010 Alcohol abuse, uncomplicated: Secondary | ICD-10-CM | POA: Diagnosis present

## 2018-03-21 DIAGNOSIS — R9439 Abnormal result of other cardiovascular function study: Secondary | ICD-10-CM | POA: Diagnosis present

## 2018-03-21 DIAGNOSIS — N179 Acute kidney failure, unspecified: Secondary | ICD-10-CM | POA: Diagnosis present

## 2018-03-21 DIAGNOSIS — I10 Essential (primary) hypertension: Secondary | ICD-10-CM | POA: Diagnosis present

## 2018-03-21 DIAGNOSIS — M7989 Other specified soft tissue disorders: Secondary | ICD-10-CM | POA: Diagnosis present

## 2018-03-21 DIAGNOSIS — I5041 Acute combined systolic (congestive) and diastolic (congestive) heart failure: Secondary | ICD-10-CM | POA: Diagnosis present

## 2018-03-21 LAB — COMPREHENSIVE METABOLIC PANEL
ALT: 38 U/L (ref 0–55)
AST (SGOT): 47 U/L — ABNORMAL HIGH (ref 5–34)
Albumin/Globulin Ratio: 0.9 (ref 0.9–2.2)
Albumin: 3 g/dL — ABNORMAL LOW (ref 3.5–5.0)
Alkaline Phosphatase: 51 U/L (ref 38–106)
Anion Gap: 11 (ref 5.0–15.0)
BUN: 35 mg/dL — ABNORMAL HIGH (ref 9.0–28.0)
Bilirubin, Total: 2 mg/dL — ABNORMAL HIGH (ref 0.2–1.2)
CO2: 26 mEq/L (ref 22–29)
Calcium: 8.5 mg/dL (ref 8.5–10.5)
Chloride: 100 mEq/L (ref 100–111)
Creatinine: 1.6 mg/dL — ABNORMAL HIGH (ref 0.7–1.3)
Globulin: 3.2 g/dL (ref 2.0–3.6)
Glucose: 110 mg/dL — ABNORMAL HIGH (ref 70–100)
Potassium: 3.4 mEq/L — ABNORMAL LOW (ref 3.5–5.1)
Protein, Total: 6.2 g/dL (ref 6.0–8.3)
Sodium: 137 mEq/L (ref 136–145)

## 2018-03-21 LAB — CBC
Absolute NRBC: 0 10*3/uL (ref 0.00–0.00)
Hematocrit: 40.9 % (ref 37.6–49.6)
Hgb: 13.3 g/dL (ref 12.5–17.1)
MCH: 29.2 pg (ref 25.1–33.5)
MCHC: 32.5 g/dL (ref 31.5–35.8)
MCV: 89.7 fL (ref 78.0–96.0)
MPV: 13.9 fL — ABNORMAL HIGH (ref 8.9–12.5)
Nucleated RBC: 0 /100 WBC (ref 0.0–0.0)
Platelets: 95 10*3/uL — ABNORMAL LOW (ref 142–346)
RBC: 4.56 10*6/uL (ref 4.20–5.90)
RDW: 13 % (ref 11–15)
WBC: 4.87 10*3/uL (ref 3.10–9.50)

## 2018-03-21 LAB — HEMOLYSIS INDEX: Hemolysis Index: 17 (ref 0–18)

## 2018-03-21 LAB — GFR: EGFR: 52.4

## 2018-03-21 MED ORDER — CARVEDILOL 6.25 MG PO TABS
6.25 mg | ORAL_TABLET | Freq: Two times a day (BID) | ORAL | 0 refills | Status: AC
Start: 2018-03-21 — End: 2018-04-20

## 2018-03-21 MED ORDER — SACUBITRIL-VALSARTAN 24-26 MG PO TABS
1.0000 | ORAL_TABLET | Freq: Two times a day (BID) | ORAL | 0 refills | Status: AC
Start: 2018-03-21 — End: 2018-04-20

## 2018-03-21 MED ORDER — SACUBITRIL-VALSARTAN 24-26 MG PO TABS
24.00 mg | ORAL_TABLET | Freq: Two times a day (BID) | ORAL | Status: DC
Start: 2018-03-21 — End: 2018-03-21
  Filled 2018-03-21 (×2): qty 1

## 2018-03-21 MED ORDER — TAB-A-VITE/BETA CAROTENE PO TABS
1.00 | ORAL_TABLET | Freq: Every day | ORAL | 0 refills | Status: AC
Start: 2018-03-22 — End: 2018-04-21

## 2018-03-21 MED ORDER — FOLIC ACID 1 MG PO TABS
1.00 mg | ORAL_TABLET | Freq: Every day | ORAL | 0 refills | Status: AC
Start: 2018-03-22 — End: 2018-04-21

## 2018-03-21 MED ORDER — ISOSORBIDE MONONITRATE ER 30 MG PO TB24
30.00 mg | ORAL_TABLET | Freq: Every day | ORAL | 0 refills | Status: AC
Start: 2018-03-22 — End: 2018-04-21

## 2018-03-21 MED ORDER — THIAMINE HCL 100 MG PO TABS
100.00 mg | ORAL_TABLET | Freq: Every day | ORAL | 0 refills | Status: AC
Start: 2018-03-22 — End: ?

## 2018-03-21 MED ORDER — FUROSEMIDE 40 MG PO TABS
40.00 mg | ORAL_TABLET | Freq: Every day | ORAL | Status: DC
Start: 2018-03-21 — End: 2018-03-21

## 2018-03-21 MED ORDER — FUROSEMIDE 40 MG PO TABS
40.00 mg | ORAL_TABLET | Freq: Every day | ORAL | 0 refills | Status: AC
Start: 2018-03-22 — End: 2018-04-21

## 2018-03-21 NOTE — Progress Notes (Signed)
[facility] Progress Note      Date Time: 03/21/18 9:35 AM  Patient Name: Chad Rubio, Chad Rubio           Subjective:   Resting comfortably. No shortness of breath.  No chest pain.  Ambulated in hallway last night without difficulty.    Medications:     Current Facility-Administered Medications   Medication Dose Route Frequency Provider Last Rate Last Dose   . acetaminophen (TYLENOL) tablet 650 mg  650 mg Oral Once PRN Darcus Pester, MD       . carvedilol (COREG) tablet 6.25 mg  6.25 mg Oral Q12H [facility] Dunning, Francine Graven, NP   6.25 mg at 03/20/18 1928   . folic acid (FOLVITE) tablet 1 mg  1 mg Oral Daily Jabbar, Lubna, MD   1 mg at 03/21/18 0910   . furosemide (LASIX) injection 40 mg  40 mg Intravenous BID Marya Amsler, MD   40 mg at 03/21/18 0910   . guaiFENesin-dextromethorphan (ROBITUSSIN DM) 100-10 MG/5ML syrup 5 mL  5 mL Oral Q4H PRN [name], MD   5 mL at 03/20/18 2152   . heparin (porcine) injection 5,000 Units  5,000 Units Subcutaneous Q12H [facility] Marya Amsler, MD   5,000 Units at 03/21/18 0910   . hydrALAZINE (APRESOLINE) tablet 10 mg  10 mg Oral Q8H [facility] Kurtis Bushman, MD   10 mg at 03/21/18 0615   . isosorbide mononitrate (IMDUR) 24 hr tablet 30 mg  30 mg Oral Daily Mckensie Scotti, Phyllis Ginger, MD   30 mg at 03/20/18 1006   . multivitamin tablet 1 tablet  1 tablet Oral Daily Marya Amsler, MD   1 tablet at 03/21/18 0910   . pneumococcal 13-val conj vacc (PREVNAR) injection 0.5 mL  0.5 mL Intramuscular Prior to discharge [name], MD       . thiamine (VITAMIN B1) tablet 100 mg  100 mg Oral Daily Marya Amsler, MD   100 mg at 03/21/18 0910       Physical Exam:     Vitals:    03/21/18 0829   BP: 90/65   Pulse:    Resp:    Temp:    SpO2:      Temp (24hrs), Avg:97.4 F (36.3 C), Min:96.4 F (35.8 C), Max:98.7 F (37.1 C)           Intake and Output Summary (Last 24 hours) at Date Time    Intake/Output Summary (Last 24 hours) at 03/21/18 0935  Last data filed at 03/21/18 0800   Gross per 24 hour    Intake             1120 ml   Output             3525 ml   Net            -2405 ml       General Appearance: Resting comfortably  Head:  normocephalic  Eyes:  EOM's intact  Neck:  No carotid bruit or significant jugular venous distension, brisk carotid upstroke  Lungs:  Clear to auscultation throughout, good respiratory effort   Heart: normal S1, S2, no S3, S4, murmurs or rubs.  Non-palpalbe PMI.  Abdomen:  Soft, non-tender, normoactive bowel sounds  Extremities:  No cyanosis, clubbing or edema  Pulses: 2+ distal pulses  Neurologic:  Alert and oriented x3, 4-5+ strength throughout  ENT:  Unremarkable  Mood: Normal      Labs:   Cardiac Enzymes:  Recent Labs  Lab 03/18/18  0231 03/17/18  2303 03/17/18  1700   Troponin I 0.13* 0.15* 0.14*     No results for input(s): TROPONIN, ISTATTROPONI, CK in the last 24 hours.    Invalid input(s): TROPONINT, CKMB[24          Lipid profile:        Recent Labs  Lab 03/21/18  0406 03/20/18  0435   Bilirubin, Total 2.0* 2.1*   Bilirubin, Direct  --  1.3*   Protein, Total 6.2 6.2   Albumin 3.0* 3.1*   ALT 38 42   AST (SGOT) 47* 54*     No results for input(s): CHOL, TRIG, HDL in the last 24 hours.    Invalid input(s): LDLC, VLDLC, LRAT[24      Recent Labs  Lab 03/20/18  0435   Magnesium 1.6           Recent Labs  Lab 03/21/18  0406 03/20/18  0435 03/19/18  0400   WBC 4.87 6.90 6.60   Hgb 13.3 13.1 14.3   Hematocrit 40.9 41.0 46.3   Platelets 95* 90* 100*   MCV 89.7 90.5 93.2   MCHC 32.5 32.0 30.9*   RDW 13 13 14        Recent Labs  Lab 03/21/18  0406 03/20/18  0435 03/19/18  0400   Sodium 137 141 141   Potassium 3.4* 3.9 5.0   Chloride 100 102 103   CO2 26 26 24    BUN 35.0* 34.0* 39.0*   Creatinine 1.6* 1.7* 2.0*   EGFR 52.4 48.9 40.5   Glucose 110* 101* 96   Calcium 8.5 8.6 9.3   AST (SGOT) 47* 54* 90*   ALT 38 42 62*       Recent Labs  Lab 03/19/18  0400   TSH 1.47           Invalid input(s): PTI, COUM, ACOAG, ACOAP  .  Lab Results   Component Value Date    BNP 7,289 (H) 03/17/2018         Recent ABG No results for input(s): TEMP, FIO2, RATE, MODE, ETCO2, PEEP in the last 24 hours.    Invalid input(s): APH, APCO2, APO2, AHCO3, ATCO2, ABE, AOSAT, ABGS, ALLEN, STATS, O2DEL, O2FLO, PRESS, VNTMN, PRSUP, TIVOL[24      Radiology:       Echocardiogram Adult Complete W Clr/ Dopp Waveform    Result Date: 03/18/2018  Name:     JAXTEN BROSH Age:     25 years DOB:     March 01, 1951 Gender:     Male MRN:     [id number] Wt:     178 lb BSA:     2.06 m2 HR:     98 bpm Systolic BP:     621 mmHg Diastolic BP:     308 mmHg Technical Quality:     Good Exam Date/Time:     03/18/2018 8:22 AM Exam Type:     ECHOCARDIOGRAM ADULT COMPLETE W CLR/ DOPP WAVEFORM Staff Sonographer:     Bubba Camp,  RDCS Ordering Physician:     Elam Dutch Study Info Indications      - new onset chf Procedure   Complete two-dimensional, color flow and spectral Doppler transthoracic echocardiogram is performed. 75 in History/Risk Factors Hypertension:     Yes Hyperlipidemia:     Yes Tobacco Use:     Yes Additional Patient History   Increased SOB, CHF, increased troponin and renal  failure. No prior echocardiogram history available for comparison. Summary   * The left atrium is severely dilated.   * Left ventricular ejection fraction is severely decreased with an estimated ejection fraction of <15%.   * The right atrium is severely dilated.   * The left ventricle is markedly  dilated with mild concentric left ventricular hypertophy and severe global left ventricular hypokinesis. Visually estimated LVEF is <15%.   * Left ventricular diastolic filling parameters are consistent with Grade II diastolic dysfunction (pseudonormal pattern) with elevated left atrial pressure.   * The right ventricle is dilated with  depressed right ventricular systolic function. .   * ortic valve is trileaflet with no aortic stenosis and mild aortic insufficiency. .   * There is mild mitral regurgitation.   * There is severe tricuspid regurgitation with severe  pulmonary hypertension.  Estimated right ventricular systolic pressure is 53 mmHg. .   * There is mild pulmonic regurgitation.   * Small circumferential pericardial effusion visualized which is not hemodynamically significant. .   * No thrombi, vegetations or other abnormal intracardiac masses. Findings Left Ventricle   The left ventricle is markedly  dilated with mild concentric left ventricular hypertophy and severe global left ventricular hypokinesis. Visually estimated LVEF is <15%.   Left ventricular wall thickness is mildly increased.   Left ventricular ejection fraction is severely decreased with an estimated ejection fraction of <15%.   Left ventricular segmental wall motion is normal.   Left ventricular diastolic filling parameters are consistent with Grade II diastolic dysfunction (pseudonormal pattern) with elevated left atrial pressure. Right Ventricle   The right ventricle is dilated with  depressed right ventricular systolic function. .   Decreased right ventricular systolic function. Left Atrium   The left atrium is severely dilated. Right Atrium   The right atrium is severely dilated. Atrial Septum   No evidence of interatrial shunt by color Doppler. Aortic Valve   The aortic valve is tricuspid.   ortic valve is trileaflet with no aortic stenosis and mild aortic insufficiency. .   There is mild aortic regurgitation. Pulmonary Valve   The pulmonic valve is structurally normal.   There is mild pulmonic regurgitation. Mitral Valve   The mitral valve is structurally normal.   There is mild mitral regurgitation. Tricuspid Valve   The tricuspid valve is structurally normal.   There is severe tricuspid regurgitation with severe pulmonary hypertension. Estimated right ventricular systolic pressure is 53 mmHg. Marland Kitchen   Severe pulmonary hypertension with estimated right ventricular systolic pressure of 63 mmHg. Pericardium / Pleural Effusion   Small circumferential pericardial effusion visualized which is not  hemodynamically significant. . Inferior Vena Cava   The IVC is dilated with < 50% respiratory variance consistent with significantly elevated RA pressure of 15 mmHg. Aorta   The aortic root is normal in size.   The ascending aorta is normal in size. Measurements 2D Measurements ---------------------------------------------------------------------- Name                                 Value        Normal ---------------------------------------------------------------------- Parasternal 2D ---------------------------------------------------------------------- RVID Diastole (2D)                 4.85 cm     2.50-3.50 IVS Diastolic Thickness (2D)       3.24 cm     0.60-1.00 LVID Diastole (2D)  6.55 cm     4.20-5.80 LVIW Diastolic Thickness (2D)                               1.06 cm     0.60-1.05 LVID Systole (2D)                  6.26 cm     2.50-4.00 LV Ejection Fraction 2D ---------------------------------------------------------------------- LV EF (2D Teichholz)                  10 %         52-72 Apical 2D Dimensions ---------------------------------------------------------------------- LA Volume Index (BP A-L)       60.29 ml/m2   16.00-34.00 M-mode Measurements ---------------------------------------------------------------------- Name                                 Value        Normal ---------------------------------------------------------------------- M-Mode ---------------------------------------------------------------------- Ao Root Diameter (MM)              3.44 cm               LA Dimension (MM)                  5.09 cm     3.00-4.10 AV Cusp Sep (MM)                   2.08 cm               TAPSE                              0.89 cm        >=1.60 LVOT/Aortic Valve Doppler Measurements ---------------------------------------------------------------------- Name                                 Value        Normal ---------------------------------------------------------------------- LVOT  Doppler ---------------------------------------------------------------------- LVOT Peak Velocity                0.80 m/s               AoV Doppler ---------------------------------------------------------------------- AV Peak Velocity                  0.69 m/s               AV Peak Gradient                 1.90 mmHg               AV V1/V2 Ratio                        1.17               AoV Regurgitation Doppler ---------------------------------------------------------------------- AR PHT                              996 ms RVOT/Pulmonic Valve Doppler Measurements ---------------------------------------------------------------------- Name                                 Value  Normal ---------------------------------------------------------------------- PV Doppler ---------------------------------------------------------------------- PV Peak Velocity                  0.61 m/s Mitral Valve Measurements ---------------------------------------------------------------------- Name                                 Value        Normal ---------------------------------------------------------------------- MV Doppler ---------------------------------------------------------------------- MV E Peak Velocity                0.74 m/s               MV A Peak Velocity                0.45 m/s               MV E/A                                1.65               MV Pressure Half Time                41 ms               MV Annular TDI ---------------------------------------------------------------------- MV Septal e' Velocity             0.03 m/s        >=0.08 MV E/e' (Septal)                     24.71        <=8.00 MV Lateral e' Velocity            0.06 m/s        >=0.10 MV E/e' (Lateral)                    12.35        <=8.00 Tricuspid Valve Measurements ---------------------------------------------------------------------- Name                                 Value        Normal  ---------------------------------------------------------------------- TV Doppler ---------------------------------------------------------------------- TV E Peak Velocity                0.54 m/s               TV Regurgitation Doppler ---------------------------------------------------------------------- TR Peak Velocity                  3.46 m/s               TR Peak Gradient                   48 mmHg               RA Pressure                        15 mmHg           <=3 RV Systolic Pressure               63 mmHg           <36 Aorta / Venous Measurements ---------------------------------------------------------------------- Name  Value        Normal ---------------------------------------------------------------------- IVC/SVC ---------------------------------------------------------------------- IVC Diameter (Exp 2D)              2.49 cm        <=2.10 Report Signatures Finalized ZO:[id number]  Rosenfeld on 03/18/2018 6:58:05 PM Promoted [id number]  [place] on 03/18/2018 9:32:50 AM    Nm Myocardial Perfusion Spect (stress And Rest)    Result Date: 03/19/2018                                                                                    [facility]                                                                                 [address]                                                                                 [place], [place] [address]                                                                                 [phone]  Myocardial Perfusion Report  DIONTAY, ROSENCRANS                  Exam Date: 03/19/2018 09:13          Ordering Phys: Montey Hora  MRN:  [id number]       Gender: M    Exam Location: [facility] - [place]       Referring Phys: Age:  89   DOB:  Sep 29, 1950       Ht (in):75  Wt (lb):161 BSA  1.95    PCP:           Earlie Raveling S                                                            : Nurse / ECG Tech:  Haimanot Gulilat  Nuclear Technologist:Keefe, Meg CNMT Indications:     CHEST PAIN, ACUTE CORONARY SYNDROME SUSPECTED; 67 yo with recent onset CHF. Procedure Performed:Pharmacological Stress and Rest Myocardial Perfusion SPECT Imaging with LV function analysis. Patient History:  Hypertension, , Hyperlipidemia, Diabetes mellitus  IMPRESSIONS Severe left ventricular dysfunction and calculated LVEF 23%.  Abnormal stress and rest myocardial perfusion study with  a small partially reversible defect in the infero-apical segment of the left  ventricle Somewhat suboptimal study due to extensive patient motion      STRESS TEST     Protocol: Lexiscan Duration (m:s):15 secsDose: 0.4mg  regadenoson given 30 seconds prior to stress radionuclide Pharmacologic Administration Site:Intravenous Left     Administered UE:[id number] Gulilat  Resting HR (bpm):  94    Resting BP (mmHg):143  /98        MPHR:   153      Target HR:     130 Peak HR (bpm):     103   Peak BP (mmHg):   143  /98      % MPHR:   67       Double Product:14729 BP Response:       Normal hemodynamic response to Lexiscan. Termination Reason:Lexiscan protocol completed.  ECG ANALYSIS   Stress Test Interpreted BJ:YNWGN Lucianne Muss The above interpretation is as per the stressing physician who was physically present and supervised and interpreted the study. IMAGE PROTOCOL       Rest/Stress 1 Day   Stress radionuclide dose injected at peak stress.RadiopharDose (mCi)ticalAdministration SiteAdministered by Rest:   Tc-7m Tetrofosmin   11.0         IV - left hand            Elder Cyphers CNMT Stress: Tc-46m Tetrofosmin   32.0         IV - left hand            Elder Cyphers CNMT          Imaging  Date & Time Inj to Img Time (min)Camera Used                           Position Rest:   19-Mar-2018          30                   S[employer]ens Symbia                        Supine Stress: 19-Mar-2018          45                   S[employer] Symbia                        Supine SPECT RESULTS   Technical  Quality:  Adequate Image Corrections:  Patient motion artifact - motion correction applied PERFUSION FINDINGS    FUNCTIONAL RESULTS     (calculated via Gated SPECT)  Post Stress LV EF (%): 23  Stress EDV (mL):240           EDVI (ml/m??? ):123  Stress ESV (mL):186           ESVI (ml/m??? ):95   FUNCTIONAL FINDINGS: Global hypokinesis    Event organiser   (Electronically Signed)   Final Date:19 March 2018 14:28     US Renal Kidney    Result Date: 03/18/2018  RENAL ULTRASOUND CLINICAL STATEMENT: Chronic renal failure. COMPARISON: No prior studies are available for comparison. TECHNIQUE: A sonogram of the kidneys was performed utilizing gray-scale appearance and color Doppler. FINDINGS: RIGHT KIDNEY: The right kidney is normal in echogenicity  measuring 10.8  cm in longitudinal dimension. No right hydronephrosis, mass or calculi are visualized. LEFT KIDNEY: The left kidney is normal in echogenicity measuring 10.9 cm in longitudinal dimension. No left hydronephrosis, mass or calculi are visualized. BLADDER: Prior to voiding the bladder had a volume of approximately 190 cc. The ureteral jets cannot be visualized . No distal ureteral dilatation or stone is identified. The post void bladder residual volume is 96  cc. The prostate measures 2.1 x 4.9 x 2.3 cm with a volume of 12 cc.      Post void residual volume of 96 cc. Fonnie Mu, MD 03/18/2018 8:48 PM    Chest Ap Portable    Result Date: 03/17/2018  HISTORY: Acute anterior chest pain, shortness of breath. COMPARISON: None available at dictation. FINDINGS: Single portable AP view the chest was performed. There is cardiomegaly with bilateral pleural effusions. There is mild increase in interstitial markings bilaterally. The upper lobe pulmonary veins are distended. There is tortuous aorta. No pneumothorax or consolidation is seen. There is focal volume loss in the lower lung fields. Trachea is judged unremarkable.     1. Cardiomegaly with low-grade CHF. There is mild  hypoventilation in the lung bases. Charlene Brooke, MD 03/17/2018 5:17 PM    [place] Venodopp Low Extremity Bilateral    Result Date: 03/18/2018  EXAMINATION: Venous Duplex Bilateral Lower Extremities CLINICAL HISTORY: 67 year old male with bilateral lower extremity pain and swelling TECHNIQUE:  Duplex evaluation of the veins of the lower extremities is performed from the lower pelvis to the upper calves bilaterally with gray scale imaging, transverse compression and gated and color Doppler techniques.  Additional calf vein evaluation is performed bilaterally to the ankle. INTERPRETATION:   Examination of the deep venous system of right and left lower extremities demonstrates no evidence of intraluminal thrombus or obstruction to venous flow.  Normal phasicity is present at the iliofemoral junctions indicating no central obstruction.  There is normal coaptation of the femoropopliteal veins throughout their course with transverse compression.  The saphenofemoral junction and greater saphenous are also noted to be widely patent bilaterally.  Deep and superficial veins of the calves are also demonstrated to be patent without thrombus or obstruction.         Normal venous duplex of the lower extremities.  No evidence of intraluminal thrombus or obstruction to venous flow in right or left lower extremity. Denna Haggard, MD 03/18/2018 8:57 AM          Assessment:    Shortness of breath   Acute systolic CHF   Dilated cardiomyopathy - LVEF <15%   NSVT   Hypertension/grade II diastolic dysfunction   Dilated RV with depressed systolic function   Mild aortic valve insufficiency   Mild mitral valve regurgitation/severely dilated LA   Severe tricuspid valve regurgitation/severely dilated RA   Pulmonary hypertension with estimated RV systolic pressure 53 mmHg   Renal insufficiency   History of ETOH abuse - up to 1/5 ETOH on weekends - none since June   History of smoking   Abnormal lexiscan nuclear study: small partially  reversible defect in the infero-apical segment of the left ventricle     Recommendations:      Continue coreg, hydralazine/nitroglycerin and lasix for cardiomyopathy.  Will lower dose of lasix and change to po today   If okay with nephrology, would change hydralazine to entresto   Continue coreg and add aspirin empirically for coronary insufficiency   May be discharged home today from a cardiovascular standpoint.  I will help make arrangements for close follow up in our office next week.    Signed by: Kurtis Bushman, M.D., F.A.C.C.

## 2018-03-21 NOTE — Plan of Care (Signed)
Going home instructions, appointment and prescriptions reviewed with patient. Also discussed heart failure going home instructions, how to take medications , monitor blood pressure, weight, fluid restrictions and diet. Patient verbalized will buy blood pressure machine. Saline lock and cardiac monitor out. Waiting for his ride.

## 2018-03-21 NOTE — Plan of Care (Signed)
Problem: Safety  Goal: Patient will be free from injury during hospitalization  Outcome: Progressing   03/21/18 5409   Goal/Interventions addressed this shift   Patient will be free from injury during hospitalization  Assess patient's risk for falls and implement fall prevention plan of care per policy;Provide and maintain safe environment;Use appropriate transfer methods;Ensure appropriate safety devices are available at the bedside;Include patient/ family/ care giver in decisions related to safety;Hourly rounding       Problem: Everyday - Heart Failure  Goal: Stable Vital Signs and Fluid Balance  Outcome: Progressing   03/21/18 0937   Goal/Interventions addressed this shift   Stable Vital Signs and Fluid Balance Daily Standing Weights in the morning using the same scale, after using the bathroom and before breadfast. If unable to stand, zero the bed and use the bed scale;Monitor, assess vital signs and telemetry per policy;Monitor labs and report abnormalities to physician;Strict Intake/Output;Assess for swelling/edema;Wean oxygen as needed if appropriate       Problem: Impaired Mobility  Goal: Mobility/Activity is maintained at optimal level for patient  Outcome: Progressing      Comments: Patient A/Ox 4. SR on the monitor. Denies shortness of breath or discomfort. K 3.4 Dr Ambrose Pancoast notified. Low BP discussed with Dr Cassell Smiles. IV lasix given. Will continue to monitor vital signs, monitor blood works, daily weight, monitor intake and output. Fluid restrictions 1200 ml/day reinforced. Safe environment provided. Instructed to call for assistance.

## 2018-03-21 NOTE — Discharge Instructions (Signed)
Sacubitril; Valsartan oral tablet  Brand Name: Entresto  What is this medicine?  SACUBITRIL; VALSARTAN (sak UE bi tril; val SAR tan) is a combination of 2 drugs used to reduce the risk of death and hospitalizations in people with long-lasting heart failure. It is usually used with other medicines to treat heart failure.  How should I use this medicine?  Take this medicine by mouth with a glass of water. Follow the directions on the prescription label. You can take it with or without food. If it upsets your stomach, take it with food. Take your medicine at regular intervals. Do not take it more often than directed. Do not stop taking except on your doctor's advice.  Do not take this medicine for at least 36 hours before or after you take an ACE inhibitor medicine. Talk to your health care provider if you are not sure if you take an ACE inhibitor.  Talk to your pediatrician regarding the use of this medicine in children. Special care may be needed.  What side effects may I notice from receiving this medicine?  Side effects that you should report to your doctor or health care professional as soon as possible:   allergic reactions like skin rash, itching or hives, swelling of the face, lips, or tongue   signs and symptoms of increased potassium like muscle weakness; chest pain; or fast, irregular heartbeat   signs and symptoms of kidney injury like trouble passing urine or change in the amount of urine   signs and symptoms of low blood pressure like feeling dizzy or lightheaded, or if you develop extreme fatigue  Side effects that usually do not require medical attention (report to your doctor or health care professional if they continue or are bothersome):   cough  What may interact with this medicine?  Do not take this medicine with any of the following medicines:   aliskiren if you have diabetes   angiotensin-converting enzyme (ACE) inhibitors, like benazepril, captopril, enalapril, fosinopril, lisinopril,  or ramipril  This medicine may also interact with the following medicines:   angiotensin II receptor blockers (ARBs) like azilsartan, candesartan, eprosartan, irbesartan, losartan, olmesartan, telmisartan, or valsartan   lithium   NSAIDS, medicines for pain and inflammation, like ibuprofen or naproxen   potassium-sparing diuretics like amiloride, spironolactone, and triamterene   potassium supplements  What if I miss a dose?  If you miss a dose, take it as soon as you can. If it is almost time for next dose, take only that dose. Do not take double or extra doses.  Where should I keep my medicine?  Keep out of the reach of children.  Store at room temperature between 15 and 30 degrees C (59 and 86 degrees F). Throw away any unused medicine after the expiration date.  What should I tell my health care provider before I take this medicine?  They need to know if you have any of these conditions:   diabetes and take a medicine that contains aliskiren   kidney disease   liver disease   an unusual or allergic reaction to sacubitril; valsartan, drugs called angiotensin converting enzyme (ACE) inhibitors, angiotensin II receptor blockers (ARBs), other medicines, foods, dyes, or preservatives   pregnant or trying to get pregnant   breast-feeding  What should I watch for while using this medicine?  Tell your doctor or healthcare professional if your symptoms do not start to get better or if they get worse.  Do not become pregnant while taking   this medicine. Women should inform their doctor if they wish to become pregnant or think they might be pregnant. There is a potential for serious side effects to an unborn child. Talk to your health care professional or pharmacist for more information.  You may get dizzy. Do not drive, use machinery, or do anything that needs mental alertness until you know how this medicine affects you. Do not stand or sit up quickly, especially if you are an older patient. This reduces the  risk of dizzy or fainting spells. Avoid alcoholic drinks; they can make you more dizzy.  NOTE:This sheet is a summary. It may not cover all possible information. If you have questions about this medicine, talk to your doctor, pharmacist, or health care provider. Copyright 2019 [employer]        Isosorbide Mononitrate extended-release tablets  Brand Names: Imdur, Isotrate ER  What is this medicine?  ISOSORBIDE MONONITRATE (eye soe SOR bide mon oh NYE trate) is a vasodilator. It relaxes blood vessels, increasing the blood and oxygen supply to your heart. This medicine is used to prevent chest pain caused by angina. It will not help to stop an episode of chest pain.  How should I use this medicine?  Take this medicine by mouth with a glass of water. Follow the directions on the prescription label. Do not crush or chew. Take your medicine at regular intervals. Do not take your medicine more often than directed. Do not stop taking this medicine except on the advice of your doctor or health care professional.  Talk to your pediatrician regarding the use of this medicine in children. Special care may be needed.  What side effects may I notice from receiving this medicine?  Side effects that you should report to your doctor or health care professional as soon as possible:   bluish discoloration of lips, fingernails, or palms of hands   irregular heartbeat, palpitations   low blood pressure   nausea, vomiting   persistent headache   unusually weak or tired  Side effects that usually do not require medical attention (report to your doctor or health care professional if they continue or are bothersome):   flushing of the face or neck   rash  What may interact with this medicine?  Do not take this medicine with any of the following medications:   medicines used to treat erectile dysfunction (ED) like avanafil, sildenafil, tadalafil, and vardenafil   riociguat  This medicine may also interact with the following  medications:   medicines for high blood pressure   other medicines for angina or heart failure  What if I miss a dose?  If you miss a dose, take it as soon as you can. If it is almost time for your next dose, take only that dose. Do not take double or extra doses.  Where should I keep my medicine?  Keep out of the reach of children.  Store between 15 and 30 degrees C (59 and 86 degrees F). Keep container tightly closed. Throw away any unused medicine after the expiration date.  What should I tell my health care provider before I take this medicine?  They need to know if you have any of these conditions:   previous heart attack or heart failure   an unusual or allergic reaction to isosorbide mononitrate, nitrates, other medicines, foods, dyes, or preservatives   pregnant or trying to get pregnant   breast-feeding  What should I watch for while using this medicine?  Check your heart rate and blood pressure regularly while you are taking this medicine. Ask your doctor or health care professional what your heart rate and blood pressure should be and when you should contact him or her. Tell your doctor or health care professional if you feel your medicine is no longer working.  You may get dizzy. Do not drive, use machinery, or do anything that needs mental alertness until you know how this medicine affects you. To reduce the risk of dizzy or fainting spells, do not sit or stand up quickly, especially if you are an older patient. Alcohol can make you more dizzy, and increase flushing and rapid heartbeats. Avoid alcoholic drinks.  Do not treat yourself for coughs, colds, or pain while you are taking this medicine without asking your doctor or health care professional for advice. Some ingredients may increase your blood pressure.  NOTE:This sheet is a summary. It may not cover all possible information. If you have questions about this medicine, talk to your doctor, pharmacist, or health care provider. Copyright 2019  [employer]        Furosemide tablets  Brand Names: Active-Medicated Specimen Kit, Delone, Diuscreen, Lasix, RX Specimen Collection Kit, Specimen Collection Kit  What is this medicine?  FUROSEMIDE (fyoor OH se mide) is a diuretic. It helps you make more urine and to lose salt and excess water from your body. This medicine is used to treat high blood pressure, and edema or swelling from heart, kidney, or liver disease.  How should I use this medicine?  Take this medicine by mouth with a glass of water. Follow the directions on the prescription label. You may take this medicine with or without food. If it upsets your stomach, take it with food or milk. Do not take your medicine more often than directed. Remember that you will need to pass more urine after taking this medicine. Do not take your medicine at a time of day that will cause you problems. Do not take at bedtime.  Talk to your pediatrician regarding the use of this medicine in children. While this drug may be prescribed for selected conditions, precautions do apply.  What side effects may I notice from receiving this medicine?  Side effects that you should report to your doctor or health care professional as soon as possible:   blood in urine or stools   dry mouth   fever or chills   hearing loss or ringing in the ears   irregular heartbeat   muscle pain or weakness, cramps   skin rash   stomach upset, pain, or nausea   tingling or numbness in the hands or feet   unusually weak or tired   vomiting or diarrhea   yellowing of the eyes or skin  Side effects that usually do not require medical attention (report to your doctor or health care professional if they continue or are bothersome):   headache   loss of appetite   unusual bleeding or bruising  What may interact with this medicine?     aspirin and aspirin-like medicines   certain antibiotics   chloral  hydrate   cisplatin   cyclosporine   digoxin   diuretics   laxatives   lithium   medicines for blood pressure   medicines that relax muscles for surgery   methotrexate   NSAIDs, medicines for pain and inflammation like ibuprofen, naproxen, or indomethacin   phenytoin   steroid medicines like prednisone or cortisone   sucralfate  thyroid hormones  What if I miss a dose?  If you miss a dose, take it as soon as you can. If it is almost time for your next dose, take only that dose. Do not take double or extra doses.  Where should I keep my medicine?  Keep out of the reach of children.  Store at room temperature between 15 and 30 degrees C (59 and 86 degrees F). Protect from light. Throw away any unused medicine after the expiration date.  What should I tell my health care provider before I take this medicine?  They need to know if you have any of these conditions:   abnormal blood electrolytes   diarrhea or vomiting   gout   heart disease   kidney disease, small amounts of urine, or difficulty passing urine   liver disease   thyroid disease   an unusual or allergic reaction to furosemide, sulfa drugs, other medicines, foods, dyes, or preservatives   pregnant or trying to get pregnant   breast-feeding  What should I watch for while using this medicine?  Visit your doctor or health care professional for regular checks on your progress. Check your blood pressure regularly. Ask your doctor or health care professional what your blood pressure should be, and when you should contact him or her. If you are a diabetic, check your blood sugar as directed.  You may need to be on a special diet while taking this medicine. Check with your doctor. Also, ask how many glasses of fluid you need to drink a day. You must not get dehydrated.  You may get drowsy or dizzy. Do not drive, use machinery, or do anything that needs mental alertness until you know how this drug affects you. Do not stand or sit up quickly,  especially if you are an older patient. This reduces the risk of dizzy or fainting spells. Alcohol can make you more drowsy and dizzy. Avoid alcoholic drinks.  This medicine can make you more sensitive to the sun. Keep out of the sun. If you cannot avoid being in the sun, wear protective clothing and use sunscreen. Do not use sun lamps or tanning beds/booths.  NOTE:This sheet is a summary. It may not cover all possible information. If you have questions about this medicine, talk to your doctor, pharmacist, or health care provider. Copyright 2019 [employer]        Carvedilol tablets  Brand Name: Coreg  What is this medicine?  CARVEDILOL (KAR ve dil ol) is a beta-blocker. Beta-blockers reduce the workload on the heart and help it to beat more regularly. This medicine is used to treat high blood pressure and heart failure.  How should I use this medicine?  Take this medicine by mouth with a glass of water. Follow the directions on the prescription label. It is best to take the tablets with food. Take your doses at regular intervals. Do not take your medicine more often than directed. Do not stop taking except on the advice of your doctor or health care professional.  Talk to your pediatrician regarding the use of this medicine in children. Special care may be needed.  What side effects may I notice from receiving this medicine?  Side effects that you should report to your doctor or health care professional as soon as possible:   allergic reactions like skin rash, itching or hives, swelling of the face, lips, or tongue   breathing problems   dark urine   irregular heartbeat  swollen legs or ankles   vomiting   yellowing of the eyes or skin  Side effects that usually do not require medical attention (report to your doctor or health care professional if they continue or are bothersome):   change in sex drive or performance   diarrhea   dry eyes (especially if wearing contact lenses)   dry, itching  skin   headache   nausea   unusually tired  What may interact with this medicine?  This medicine may interact with the following medications:   certain medicines for blood pressure, heart disease, irregular heart beat   certain medicines for depression, like fluoxetine or paroxetine   certain medicines for diabetes, like glipizide or glyburide   cimetidine   clonidine   cyclosporine   digoxin   MAOIs like Carbex, Eldepryl, Marplan, Nardil, and Parnate   reserpine   rifampin  What if I miss a dose?  If you miss a dose, take it as soon as you can. If it is almost time for your next dose, take only that dose. Do not take double or extra doses.  Where should I keep my medicine?  Keep out of the reach of children.  Store at room temperature below 30 degrees C (86 degrees F). Protect from moisture. Keep container tightly closed. Throw away any unused medicine after the expiration date.  What should I tell my health care provider before I take this medicine?  They need to know if you have any of these conditions:   circulation problems   diabetes   history of heart attack or heart disease   liver disease   lung or breathing disease, like asthma or emphysema   pheochromocytoma   slow or irregular heartbeat   thyroid disease   an unusual or allergic reaction to carvedilol, other beta-blockers, medicines, foods, dyes, or preservatives   pregnant or trying to get pregnant   breast-feeding  What should I watch for while using this medicine?  Check your heart rate and blood pressure regularly while you are taking this medicine. Ask your doctor or health care professional what your heart rate and blood pressure should be, and when you should contact him or her. Do not stop taking this medicine suddenly. This could lead to serious heart-related effects.  Contact your doctor or health care professional if you have difficulty breathing while taking this drug.  Check your weight daily. Ask your doctor or  health care professional when you should notify him/her of any weight gain.  You may get drowsy or dizzy. Do not drive, use machinery, or do anything that requires mental alertness until you know how this medicine affects you. To reduce the risk of dizzy or fainting spells, do not sit or stand up quickly. Alcohol can make you more drowsy, and increase flushing and rapid heartbeats. Avoid alcoholic drinks.  If you have diabetes, check your blood sugar as directed. Tell your doctor if you have changes in your blood sugar while you are taking this medicine.  If you are going to have surgery, tell your doctor or health care professional that you are taking this medicine.  NOTE:This sheet is a summary. It may not cover all possible information. If you have questions about this medicine, talk to your doctor, pharmacist, or health care provider. Copyright 2019 [employer]

## 2018-03-21 NOTE — Progress Notes (Signed)
Patient discharged home with belongings.

## 2018-03-21 NOTE — Discharge Instr - AVS First Page (Signed)
[facility] DISCHARGE INSTRUCTIONS     Date of Admission: 03/17/2018    Date of Discharge: 03/21/2018    Discharge Physician: [name]    Dear Weber Cooks Birkel,     Thank you for choosing [facility] for your emergency care needs. We strive to provide EXCELLENT care to you and your family.     In an effort to explain clearly why you were here in the hospital, I've written a very brief summary. I hope that you find it useful. Other details including formal diagnosis, medication changes, follow up appointment recommendations, and access to MyChart for formal medical records can be found in this packet.       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 Make sure to follow up with Dr. Devona Konig. I cannot stress the importance of follow up enough.    Make sure to bring the following to your doctors appointments:  Medications in their original bottles  Glucometer/blood sugar log (if diabetic)   Weight log (if you have heart failure)    If you are unable to obtain an appointment, unable to obtain newly prescribed medications, or are unclear about any of your discharge instructions please contact me at [phone] (M-F, 8am-3pm) or weekends and after hours via the hospital operator [phone]) [phone], the hospital case manager, or your primary care physician.    Finally, as your discharging physician, you may be receiving a survey which is regarding my care. I would greatly value and appreciate your feedback as I strive for excellence.     Respectfully yours,    [name]

## 2018-03-21 NOTE — Progress Notes (Signed)
Patient has been medically cleared for discharge to home with no additional skilled services. No PCP. TCM referral sent. Patient arranged for transport home.          03/21/18 1427   Discharge Disposition   Patient preference/choice provided? N/A   Physical Discharge Disposition Home   Mode of Transportation Car   Patient/Family/POA notified of transfer plan Yes   Patient agreeable to discharge plan/expected d/c date? Yes   Bedside nurse notified of transport plan? Yes   CM Interventions   Follow up appointment scheduled? Yes  (TCM referral, No PCP)   Referral made for home health RN visit? Does not meet home bound criteria   Multidisciplinary rounds/family meeting before d/c? Yes   Medicare Checklist   Is this a Medicare patient? Yes

## 2018-03-21 NOTE — Plan of Care (Signed)
Problem: Safety  Goal: Patient will be free from injury during hospitalization  Outcome: Progressing   03/20/18 2359   Goal/Interventions addressed this shift   Patient will be free from injury during hospitalization  Assess patient's risk for falls and implement fall prevention plan of care per policy;Provide and maintain safe environment;Ensure appropriate safety devices are available at the bedside;Include patient/ family/ care giver in decisions related to safety;Hourly rounding       Problem: Everyday - Heart Failure  Goal: Stable Vital Signs and Fluid Balance  Outcome: Progressing   03/20/18 2359   Goal/Interventions addressed this shift   Stable Vital Signs and Fluid Balance Daily Standing Weights in the morning using the same scale, after using the bathroom and before breadfast. If unable to stand, zero the bed and use the bed scale;Monitor, assess vital signs and telemetry per policy;Monitor labs and report abnormalities to physician;Strict Intake/Output;Fluid Restriction;Assess for swelling/edema       Problem: Hemodynamic Status: Cardiac  Goal: Stable vital signs and fluid balance  Outcome: Progressing   03/20/18 2359   Goal/Interventions addressed this shift   Stable vital signs and fluid balance Monitor/assess vital signs and telemetry per unit protocol;Weigh on admission and record weight daily;Assess signs and symptoms associated with cardiac rhythm changes;Monitor intake/output per unit protocol and/or LIP order;Monitor lab values;Monitor for leg swelling/edema and report to LIP if abnormal     The pt learning abilities have been assessed. Pt AAOx4.VSS. Today's individual plan of care is to continue  hourly rounding, monitor vs,cardiacrythm, fluid restriction,safety and fall prevention were discussed with the pt during bedside report,pt verbalized agreement. Pt demonstrate understanding of disease process,treatment plan,medication and consequences of non compliance. Pt denies for chest pain, SOB and  dizziness. All question and concerned were addressed. Will continue to monitor.

## 2018-03-21 NOTE — Discharge Summary (Signed)
[facility]      Patient: Chad Rubio  Admission Date: 03/17/2018   DOB: 1951-05-14  Discharge Date: 03/21/2018    MRN: [id number]  Discharge Attending:Drakkar Medeiros Margit Hanks     Referring Physician: Pcp, None, MD  PCP: Pcp, None, MD       DISCHARGE SUMMARY     Discharge Information   Admission Diagnosis:   Acute combined systolic and diastolic CHF, NYHA class 3    Discharge Diagnosis:   Active Hospital Problems    Diagnosis   . Acute combined systolic and diastolic CHF, NYHA class 3   . ARF (acute renal failure)   . HTN (hypertension)   . Abnormal nuclear stress test   . Alcohol consumption binge drinking   . Leg swelling   . NSVT (nonsustained ventricular tachycardia)        Admission Condition: Guarded  Discharge Condition: Stable  Consultants: Cardiology, nephrology  Functional Status: Able to ambulate without any assistance  Discharged to: Home with supervision    Discharge Medications:     Medication List      START taking these medications    carvedilol 6.25 MG tablet  Commonly known as:  COREG  Take 1 tablet (6.25 mg total) by mouth every 12 (twelve) hours  Notes to patient:  Hold if systolic blood pressure is less than 100 and heart rate less than 60     folic acid 1 MG tablet  Commonly known as:  FOLVITE  Take 1 tablet (1 mg total) by mouth daily  Start taking on:  03/22/2018     furosemide 40 MG tablet  Commonly known as:  LASIX  Take 1 tablet (40 mg total) by mouth daily  Start taking on:  03/22/2018     isosorbide mononitrate 30 MG 24 hr tablet  Commonly known as:  IMDUR  Take 1 tablet (30 mg total) by mouth daily  Start taking on:  03/22/2018  Notes to patient:  Hold if systolic blood pressure is less than 100      multivitamin Tabs  Take 1 tablet by mouth daily  Start taking on:  03/22/2018     sacubitril-valsartan 24-26 MG Tabs per tablet  Commonly known as:  ENTRESTO  Take 1 tablet by mouth every 12 (twelve) hours  Notes to patient:  Hold if systolic blood pressure is less than 95      thiamine 100 MG  tablet  Commonly known as:  B-1  Take 1 tablet (100 mg total) by mouth daily  Start taking on:  03/22/2018        CONTINUE taking these medications    albuterol 108 (90 Base) MCG/ACT inhaler  Commonly known as:  PROVENTIL HFA;VENTOLIN HFA     FLONASE 50 MCG/ACT nasal spray  Generic drug:  fluticasone           Where to Get Your Medications      You can get these medications from any pharmacy    Bring a paper prescription for each of these medications   carvedilol 6.25 MG tablet   folic acid 1 MG tablet   furosemide 40 MG tablet   isosorbide mononitrate 30 MG 24 hr tablet   multivitamin Tabs   sacubitril-valsartan 24-26 MG Tabs per tablet   thiamine 100 MG tablet             Hospital Course         Hospital Course (3 Days)     67 y.o. male  with medical history of hypertension, former smoker (quit 4 months ago), alcohol abuse (drinks 1 pint on weekend) who presented with worsening shortness of breath x2 months.    Symptoms associated with orthopnea, dyspnea on exertion, cough generalized fatigue, decreased ADL, lower examinee swelling.      1. New onset acute combined systolic/diastolic CHF:(SOB-rest and exertional, fatigue, orthopnea, JVD, + CXR, BNP 7289) ejection fraction less than 15% with grade 2 diastolic dysfunction  2. HTN  3. ARF:Cr 2.1 POA. Unsure baseline.  Now improved at 1.6  4. Abnormal EKG/abnormal nuclear stress test  5. Binge drinking: 1 pint on weekend  6. LLE swelling  7.  NSVT      -Reviewed CXR; showing cardiomegaly with bilateral pleural effusion.  -Reviewed EKG: Normal sinus rhythm. T-wave inversion in lateral leads. Prolonged QTC.  -Status post echocardiogram showing severely reduced left ventricular systolic function with ejection fraction of less than 15%.  Patient also had grade 2 diastolic dysfunction.  Severely dilated right atrium mild concentric left ventricular hypertrophy and severe global left ventricular hypokinesis.  Severe tricuspid regurgitation, mild mitral  regurgitation, severe pulmonary hypertension mild aortic insufficiency    Status post nuclear stress test showing abnormal stress test and rest myocardial perfusion study with small partially reversible defect in the inferior apical segment of the left ventricle    -Continue with oral Lasix with recheck electrolytes and renal functions as an outpatient. Strict I&Os, water restriction, low salt diet and cardiac rehab on discharge.   - continue with Coreg 6.25 mg twice daily.  Patient was also started on Entresto as per cardiology recommendation  -Follow-up with cardiology as an outpatient  -Follow-up with nephrology as an outpatient    -Alcohol cessation education. Multivitamins.  Thiamine and folate.  Last drink was on weekend.      Procedures/Imaging:   US Renal Kidney    Result Date: 03/18/2018   Post void residual volume of 96 cc. Fonnie Mu, MD 03/18/2018 8:48 PM    Chest Ap Portable    Result Date: 03/17/2018  1. Cardiomegaly with low-grade CHF. There is mild hypoventilation in the lung bases. Charlene Brooke, MD 03/17/2018 5:17 PM    [place] Venodopp Low Extremity Bilateral    Result Date: 03/18/2018      Normal venous duplex of the lower extremities.  No evidence of intraluminal thrombus or obstruction to venous flow in right or left lower extremity. Denna Haggard, MD 03/18/2018 8:57 AM         Best Practices   Was the patient admitted with either a CHF Exacerbation or Pneumonia?  New onset CHF     Progress Note/Physical Exam at Discharge     Subjective: Today patient reports that he feels much better denies any new complaints    Vitals:    03/21/18 0829 03/21/18 1000 03/21/18 1130 03/21/18 1449   BP: 90/65 100/69 91/53 90/59    Pulse:  80 80 79   Resp:   18    Temp:   (!) 96.6 F (35.9 C)    TempSrc:   Oral    SpO2:   96%    Weight:       Height:                 General: NAD, AAOx3  HEENT: perrla, eomi, sclera anicteric, OP: Clear, MMM  Neck: supple, FROM, no LAD  Cardiovascular: RRR, no m/r/g  Lungs: CTAB,  no w/r/r  Abdomen: soft, +BS, NT/ND, no masses, no  g/r  Extremities: no C/C/E  Skin: no rashes or lesions noted  Neuro: CN 2-12 intact; No Focal neurological deficits       Diagnostics     Labs/Studies Pending at Discharge: No    Last Labs     Recent Labs  Lab 03/21/18  0406 03/20/18  0435 03/19/18  0400   WBC 4.87 6.90 6.60   RBC 4.56 4.53 4.97   Hgb 13.3 13.1 14.3   Hematocrit 40.9 41.0 46.3   MCV 89.7 90.5 93.2   Platelets 95* 90* 100*         Recent Labs  Lab 03/21/18  0406 03/20/18  0435 03/19/18  0400 03/18/18  0231 03/17/18  1700   Sodium 137 141 141 139 139   Potassium 3.4* 3.9 5.0 5.1 4.6   Chloride 100 102 103 106 107   CO2 26 26 24  18* 18*   BUN 35.0* 34.0* 39.0* 33.0* 31.0*   Creatinine 1.6* 1.7* 2.0* 2.0* 2.1*   Glucose 110* 101* 96 114* 110*   Calcium 8.5 8.6 9.3 9.4 9.7   Magnesium  --  1.6 2.0 1.9  --        Microbiology Results     None           Patient Instructions   Discharge Diet: Cardiac diet fluid restriction  Discharge Activity: As tolerated    Follow Up Appointment:  Follow-up Information     Kurtis Bushman, MD. Schedule an appointment as soon as possible for a visit in 1 week(s).    Specialties:  Cardiology, Internal Medicine  Contact information:  [address]  [address]  [place] [address]  [phone]             Cristal Ford, MD. Schedule an appointment as soon as possible for a visit in 1 week(s).    Specialty:  Nephrology  Contact information:  [address]  [place] [address]  [phone]             Pcp, None, MD Follow up.                  Time spent examining patient, discussing with patient/family regarding hospital course, chart review, reconciling medications and discharge planning: 40 minutes.    Lowanda Foster    4:50 PM 03/21/2018

## 2018-03-21 NOTE — Progress Notes (Signed)
BP on the low side. Discussed with Dr Cassell Smiles  verbalized ok to give lasix and coreg if SBP >90 and patient's asymptomatic.

## 2018-03-21 NOTE — Progress Notes (Signed)
PROGRESS NOTE    Hospital Day: 3    Assessment:     Acute kidney injury with possible chronic kidney disease.  Kidney function stable in the last 1-2 days   CHF/cardiomyopathy.  Symptoms improved  Hypertension with chronic kidney disease with left ventricular hypertrophy    Plan:     Low-sodium diet discussed in detail  Continue Lasix and carvedilol  Can be starte don low dose ARB [name])   Magnesium replacement as needed  Discussed with primary MD   Outpatient follow-up appointment      Subjective:     Patient seen today.  Sitting comfortably    No nausea vomiting diarrhea   No chest pain or shortness of breath.  Feeling much better    Review of Systems:   General:  no fever, no chills, no rigor, awake and alert, feeling better   HEENT: no neck pain, no throat pain  Endocrine: no fatigue   Respiratory: no cough, shortness of breath, or wheezing   Cardiovascular: no chest pain   Gastrointestinal: no abdominal pain,no N/V/D  Musculoskeletal: no edema  Neurological: no focal weakness  Dermatological: no rash, no ulcer    Physical Exam:     Vitals:    03/21/18 1449   BP: 90/59   Pulse: 79   Resp:    Temp:    SpO2:        Intake and Output Summary (Last 24 hours) at Date Time    Intake/Output Summary (Last 24 hours) at 03/21/18 1620  Last data filed at 03/21/18 1359   Gross per 24 hour   Intake              840 ml   Output             2825 ml   Net            -1985 ml         General: No acute distress.  HEENT: No pallor.  Neck: No jugular venous distension.  Chest: Clear to auscultation.   CVS: Regular rate and rhythm. Normal S1S2. No murmur or rub.  Abdomen: Nontender, non distended. Positive bowel sounds.  No lower extremity edema.  No rash on limbs.     Scheduled Meds: Continuous Infusions:        carvedilol 6.25 mg Oral Q12H [facility]   folic acid 1 mg Oral Daily   furosemide 40 mg Oral Daily   heparin (porcine) 5,000 Units Subcutaneous Q12H [facility]   isosorbide mononitrate 30 mg Oral Daily   multivitamin 1 tablet Oral  Daily   sacubitril-valsartan 24-26 mg Oral Q12H [facility]   thiamine 100 mg Oral Daily               Labs:       Recent Labs  Lab 03/21/18  0406 03/20/18  0435 03/19/18  0400   WBC 4.87 6.90 6.60   Hgb 13.3 13.1 14.3   Hematocrit 40.9 41.0 46.3   Platelets 95* 90* 100*       Recent Labs  Lab 03/21/18  0406 03/20/18  0435 03/19/18  0400   Sodium 137 141 141   Potassium 3.4* 3.9 5.0   Chloride 100 102 103   CO2 26 26 24    BUN 35.0* 34.0* 39.0*   Creatinine 1.6* 1.7* 2.0*   EGFR 52.4 48.9 40.5   Glucose 110* 101* 96   Calcium 8.5 8.6 9.3   Albumin 3.0* 3.1* 3.5   Phosphorus  --   --  3.6         Urine Type   Date Value Ref Range Status   03/18/2018 Clean Catch  Final     Color, UA   Date Value Ref Range Status   03/18/2018 Yellow Colorless - Yellow Final     Clarity, UA   Date Value Ref Range Status   03/18/2018 Clear Clear - Hazy Final     Specific Gravity UA   Date Value Ref Range Status   03/18/2018 1.008 1.001 - 1.035 Final     Urine pH   Date Value Ref Range Status   03/18/2018 6.0 5.0 - 8.0 Final     Nitrite, UA   Date Value Ref Range Status   03/18/2018 Negative Negative Final     Ketones UA   Date Value Ref Range Status   03/18/2018 Negative Negative Final     Urobilinogen, UA   Date Value Ref Range Status   03/18/2018 Negative 0.2 - 2.0 mg/dL Final     Bilirubin, UA   Date Value Ref Range Status   03/18/2018 Negative Negative Final     Blood, UA   Date Value Ref Range Status   03/18/2018 Negative Negative Final     RBC, UA   Date Value Ref Range Status   03/18/2018 0 - 2 0 - 5 /hpf Final     WBC, UA   Date Value Ref Range Status   03/18/2018 6 - 10 (A) 0 - 5 /hpf Final       Rads:     Radiology Results (24 Hour)     ** No results found for the last 24 hours. **        Radiological Procedure reviewed.          Cristal Ford, MD  03/21/2018

## 2018-03-23 ENCOUNTER — Encounter (INDEPENDENT_AMBULATORY_CARE_PROVIDER_SITE_OTHER): Payer: Self-pay

## 2018-03-23 NOTE — Progress Notes (Signed)
[facility] ([facility])    Received a referral to schedule a follow up appointment with the [facility].  Left patient a voicemail and provided main clinic phone number.  Requested patient return call to schedule a follow up appointment as soon as possible.       Ingrid Benitez  Transitional Services   Sched Reg Rep II  T [phone]  F [phone]

## 2018-03-24 LAB — VAHRT HISTORIC LVEF
Ejection Fraction: 15 %
Ejection Fraction: 23 %

## 2018-03-25 ENCOUNTER — Ambulatory Visit (INDEPENDENT_AMBULATORY_CARE_PROVIDER_SITE_OTHER): Payer: Self-pay | Admitting: Cardiovascular Disease

## 2018-05-05 ENCOUNTER — Ambulatory Visit (INDEPENDENT_AMBULATORY_CARE_PROVIDER_SITE_OTHER): Payer: Self-pay | Admitting: Nurse Practitioner

## 2018-05-05 ENCOUNTER — Other Ambulatory Visit (FREE_STANDING_LABORATORY_FACILITY): Payer: Medicare Other

## 2018-05-05 DIAGNOSIS — I429 Cardiomyopathy, unspecified: Secondary | ICD-10-CM

## 2018-05-05 LAB — COMPREHENSIVE METABOLIC PANEL
ALT: 29 U/L (ref 0–55)
AST (SGOT): 51 U/L — ABNORMAL HIGH (ref 5–34)
Albumin/Globulin Ratio: 1 (ref 0.9–2.2)
Albumin: 3.6 g/dL (ref 3.5–5.0)
Alkaline Phosphatase: 48 U/L (ref 38–106)
BUN: 31 mg/dL — ABNORMAL HIGH (ref 9.0–28.0)
Bilirubin, Total: 1.9 mg/dL — ABNORMAL HIGH (ref 0.2–1.2)
CO2: 21 mEq/L (ref 21–29)
Calcium: 9.3 mg/dL (ref 8.5–10.5)
Chloride: 107 mEq/L (ref 100–111)
Creatinine: 1.8 mg/dL — ABNORMAL HIGH (ref 0.5–1.5)
Globulin: 3.6 g/dL (ref 2.0–3.7)
Glucose: 107 mg/dL — ABNORMAL HIGH (ref 70–100)
Potassium: 4.5 mEq/L (ref 3.5–5.1)
Protein, Total: 7.2 g/dL (ref 6.0–8.3)
Sodium: 140 mEq/L (ref 136–145)

## 2018-05-05 LAB — GFR: EGFR: 45.7

## 2018-05-05 LAB — B-TYPE NATRIURETIC PEPTIDE: B-Natriuretic Peptide: 10662 pg/mL — ABNORMAL HIGH (ref 0–100)

## 2018-05-05 LAB — HEMOLYSIS INDEX: Hemolysis Index: 6 (ref 0–18)

## 2018-05-26 ENCOUNTER — Ambulatory Visit (INDEPENDENT_AMBULATORY_CARE_PROVIDER_SITE_OTHER): Payer: Self-pay | Admitting: Nurse Practitioner

## 2018-06-02 ENCOUNTER — Other Ambulatory Visit (FREE_STANDING_LABORATORY_FACILITY): Payer: Medicare Other

## 2018-06-02 DIAGNOSIS — I5022 Chronic systolic (congestive) heart failure: Secondary | ICD-10-CM

## 2018-06-02 LAB — GFR: EGFR: 52.4

## 2018-06-02 LAB — COMPREHENSIVE METABOLIC PANEL
ALT: 17 U/L (ref 0–55)
AST (SGOT): 28 U/L (ref 5–34)
Albumin/Globulin Ratio: 0.9 (ref 0.9–2.2)
Albumin: 3.5 g/dL (ref 3.5–5.0)
Alkaline Phosphatase: 47 U/L (ref 38–106)
BUN: 21 mg/dL (ref 9.0–28.0)
Bilirubin, Total: 1.6 mg/dL — ABNORMAL HIGH (ref 0.2–1.2)
CO2: 28 mEq/L (ref 21–29)
Calcium: 9 mg/dL (ref 8.5–10.5)
Chloride: 106 mEq/L (ref 100–111)
Creatinine: 1.6 mg/dL — ABNORMAL HIGH (ref 0.5–1.5)
Globulin: 3.8 g/dL — ABNORMAL HIGH (ref 2.0–3.7)
Glucose: 77 mg/dL (ref 70–100)
Potassium: 3.5 mEq/L (ref 3.5–5.1)
Protein, Total: 7.3 g/dL (ref 6.0–8.3)
Sodium: 142 mEq/L (ref 136–145)

## 2018-06-02 LAB — HEMOLYSIS INDEX: Hemolysis Index: 5 (ref 0–18)

## 2018-06-04 LAB — NT-PRO B-TYPE NATRIURETIC PEPTIDE: NT-proBNP: 18432 pg/mL — ABNORMAL HIGH (ref <=92)

## 2018-06-17 ENCOUNTER — Ambulatory Visit (INDEPENDENT_AMBULATORY_CARE_PROVIDER_SITE_OTHER): Payer: Self-pay | Admitting: Nurse Practitioner

## 2018-06-30 ENCOUNTER — Ambulatory Visit (INDEPENDENT_AMBULATORY_CARE_PROVIDER_SITE_OTHER): Payer: Self-pay | Admitting: Cardiovascular Disease

## 2018-07-02 ENCOUNTER — Ambulatory Visit (INDEPENDENT_AMBULATORY_CARE_PROVIDER_SITE_OTHER): Payer: Self-pay | Admitting: Cardiovascular Disease

## 2018-07-07 ENCOUNTER — Encounter: Payer: Self-pay | Admitting: Cardiology

## 2018-07-09 ENCOUNTER — Other Ambulatory Visit (FREE_STANDING_LABORATORY_FACILITY): Payer: Medicare Other

## 2018-07-09 DIAGNOSIS — I429 Cardiomyopathy, unspecified: Secondary | ICD-10-CM

## 2018-07-09 LAB — CBC AND DIFFERENTIAL
Absolute NRBC: 0 10*3/uL (ref 0.00–0.00)
Basophils Absolute Automated: 0.02 10*3/uL (ref 0.00–0.08)
Basophils Automated: 0.5 %
Eosinophils Absolute Automated: 0.07 10*3/uL (ref 0.00–0.44)
Eosinophils Automated: 1.8 %
Hematocrit: 38.6 % (ref 37.6–49.6)
Hgb: 12 g/dL — ABNORMAL LOW (ref 12.5–17.1)
Immature Granulocytes Absolute: 0 10*3/uL (ref 0.00–0.07)
Immature Granulocytes: 0 %
Lymphocytes Absolute Automated: 1.44 10*3/uL (ref 0.42–3.22)
Lymphocytes Automated: 37.4 %
MCH: 29.9 pg (ref 25.1–33.5)
MCHC: 31.1 g/dL — ABNORMAL LOW (ref 31.5–35.8)
MCV: 96.3 fL — ABNORMAL HIGH (ref 78.0–96.0)
MPV: 12.7 fL — ABNORMAL HIGH (ref 8.9–12.5)
Monocytes Absolute Automated: 0.48 10*3/uL (ref 0.21–0.85)
Monocytes: 12.5 %
Neutrophils Absolute: 1.84 10*3/uL (ref 1.10–6.33)
Neutrophils: 47.8 %
Nucleated RBC: 0 /100 WBC (ref 0.0–0.0)
Platelets: 123 10*3/uL — ABNORMAL LOW (ref 142–346)
RBC: 4.01 10*6/uL — ABNORMAL LOW (ref 4.20–5.90)
RDW: 14 % (ref 11–15)
WBC: 3.85 10*3/uL (ref 3.10–9.50)

## 2018-07-09 LAB — COMPREHENSIVE METABOLIC PANEL
ALT: 14 U/L (ref 0–55)
AST (SGOT): 38 U/L — ABNORMAL HIGH (ref 5–34)
Albumin/Globulin Ratio: 0.9 (ref 0.9–2.2)
Albumin: 3.3 g/dL — ABNORMAL LOW (ref 3.5–5.0)
Alkaline Phosphatase: 47 U/L (ref 38–106)
BUN: 14 mg/dL (ref 9.0–28.0)
Bilirubin, Total: 1.6 mg/dL — ABNORMAL HIGH (ref 0.2–1.2)
CO2: 29 mEq/L (ref 21–29)
Calcium: 8.8 mg/dL (ref 8.5–10.5)
Chloride: 102 mEq/L (ref 100–111)
Creatinine: 1.2 mg/dL (ref 0.5–1.5)
Globulin: 3.5 g/dL (ref 2.0–3.7)
Glucose: 96 mg/dL (ref 70–100)
Potassium: 3.7 mEq/L (ref 3.5–5.1)
Protein, Total: 6.8 g/dL (ref 6.0–8.3)
Sodium: 141 mEq/L (ref 136–145)

## 2018-07-09 LAB — GFR: EGFR: >60

## 2018-07-09 LAB — HEMOLYSIS INDEX: Hemolysis Index: 9 (ref 0–18)

## 2018-07-10 ENCOUNTER — Ambulatory Visit (INDEPENDENT_AMBULATORY_CARE_PROVIDER_SITE_OTHER): Payer: Self-pay

## 2018-07-10 ENCOUNTER — Ambulatory Visit: Admission: RE | Admit: 2018-07-10 | Payer: Medicare Other | Source: Ambulatory Visit | Admitting: Cardiovascular Disease

## 2018-07-10 ENCOUNTER — Encounter: Admission: RE | Payer: Self-pay | Source: Ambulatory Visit

## 2018-07-10 SURGERY — RIGHT & LEFT HEART CATH POSSIBLE PCI

## 2018-10-13 ENCOUNTER — Other Ambulatory Visit: Payer: Self-pay | Admitting: Cardiovascular Disease

## 2018-10-13 DIAGNOSIS — I429 Cardiomyopathy, unspecified: Secondary | ICD-10-CM

## 2018-10-22 ENCOUNTER — Ambulatory Visit: Admission: RE | Admit: 2018-10-22 | Payer: Medicare Other | Source: Ambulatory Visit

## 2020-10-14 DIAGNOSIS — I214 Non-ST elevation (NSTEMI) myocardial infarction: Secondary | ICD-10-CM | POA: Insufficient documentation

## 2020-10-31 NOTE — Progress Notes (Signed)
[name], [name]    Date of Visit:  05/05/2018  Date of Birth: 02/23/1951  Age: 70 yrs.   Medical Record Number: [id number]  __  CURRENT DIAGNOSES     1. Nicotine dependence, cigarettes,  uncomplicated, F17.210  2. Hypertension (essential or benign or malignant), I10  3. Cardiomyopathy, Unspecified, I42.9  4. 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 Abnormal test-abnormal pharmacologic stress nuclear study, R94.30  __   ALLERGIES    __  MEDICATIONS      1. carvedilol 6.25 mg tablet, 1 tablet twice a day  2. Entresto 24 mg-26 mg tablet, 1 tablet twice a day  3. furosemide 40 mg tablet, 1 tablet twice a day  4. isosorbide mononitrate ER 30 mg tablet,extended release 24 hr, 1 every  day hold for sbp less than 100  __  CHIEF COMPLAINT/REASON FOR VISIT  Followup of Cardiomyopathy, Unspecified and Followup of CHF chronic systolic   __  HISTORY OF PRESENT ILLNESS  Chad Rubio is a 70 year old gentleman who was last seen in the office on May 25, 2018 following a hospitalization  for congestive heart failure. He was newly diagnosed with a dilated cardiomyopathy and an ejection fraction of 15%. During that hospitalization he had an abnormal Lexiscan nuclear perfusion study with small partially reversible defect in the inferior  apical segment. He also was found to have some renal insufficiency and was recommended to follow with a nephrologist. Echocardiogram on March 18, 2018 showed left ventricular ejection fraction of less than 15%, markedly dilated left ventricle with mild  concentric left ventricular hypertrophy, dilated right ventricle with depressed right ventricular systolic function, mild mitral regurgitation, severe tricuspid regurgitation with severe pulmonary hypertension, right ventricular systolic pressure of 53  mmHg. He was to return in followup in three weeks.     Unfortunately, he is following up only now. He reports he ran out  of his medications approximately two weeks ago. He is 12-15 pounds up from his prior weight. He is short of breath with exertion.  He has elevated jugular venous distention and lower extremity edema. He denies any palpitations, dizziness, presyncope, or syncope. Of note, he had nonsustained ventricular tachycardia in the hospital. He has not yet established care with a primary care  physician nor a nephrologist. He reports he is only smoking three cigarettes a day. He has changed his hard alcohol to a glass of wine.     He has been drinking fluids liberally. He is not following fluid-restriction recommendations.   __   PAST HISTORY     Past Medical Illnesses: No previous history of significant medical illnesses.;   Past Cardiac Illnesses: No previous history of cardiac disease.; Infectious Diseases : Usual childhood illnesses of mumps, measles and chickenpox; Surgical Procedures: No previous surgical procedures.;  Trauma History: No previous history of significant trauma.; Cardiology Procedures-Invasive: No previous  interventional or invasive cardiology procedures.; Cardiology Procedures-Noninvasive: Echocardiogram August 2019, MPI Single Isotope Lexiscan August 2019;  Left Ventricular Ejection Fraction: LVEF of 15% documented via echocardiogram on 03/18/2018  ___   FAMILY HISTORY  Mother -- Heart disease  PaternalGrandparent --  Heart disease    __  CARDIAC RISK FACTORS     Tobacco Abuse: currently smoking;  Family History of Heart Disease: positive; Hyperlipidemia: negative;  Hypertension: positive;  Diabetes Mellitus: negative;  Prior History of Heart Disease: negative; Obesity: negative;  Sedentary Life Style:negative; [id number]  __  SOCIAL HISTORY    Alcohol Use: history of alcohol abuse, currently not drinking;  Smoking: smokes cigarettes,; Light tobacco smoker ([id number]); Diet: Regular diet and Caffeine  use-1-2 per day; Exercise: No regular exercise;   __  PHYSICAL EXAMINATION     Vital  Signs:  Blood Pressure:  130/82 Sitting, Right arm, regular cuff  132/82 Sitting, Left arm, regular cuff     Weight: 170.00 lbs.  Height: 74.00"  BMI:  21.82   Pulse: 88/min.       Constitutional:  cooperative, alert, oriented, short of breath with walking Skin: Warm and dry to touch, no apparent skin lesions, or masses noted.  Head: normocephalic Eyes: conjunctivae and lids normal  ENT: Ears, Nose and throat reveal no gross abnormalities Neck: JVD+++  Chest: bibasilar rales LR Cardiac: Regular rhythm, S1,S2, no S3 or S4 present, Apical impulse 5th ICS,  left MCL, no lifts or thrills palpable, no murmurs Abdomen: liver percussed below the right costal margin approximately, positive hepato-jugular reflux,  + Peripheral Pulses: pulses full and equal in all extremities Extremities/Back : 2++ pretibial edema bilaterally Neurological: No gross motor or sensory deficits noted, affect appropriate, oriented to time, person and place.    __    Medications added today by the physician:  carvedilol 6.25 mg tablet, 1 tablet twice a day, 180  Entresto 24 mg-26 mg tablet, 1 tablet  twice a day, 120  furosemide 40 mg tablet, 1 tablet twice a day, 180  isosorbide mononitrate ER 30 mg tablet,extended release 24 hr, 1 every day hold for sbp less than 100, 90      IMPRESSIONS:  1. Acute on chronic systolic heart  failure.   2. Echocardiogram on March 18, 2018: left ventricular ejection fraction of 15%, markedly dilated left ventricle with mild concentric left ventricular hypertrophy, grade II diastolic dysfunction, dilated right ventricle with depressed right  ventricular systolic function, mild mitral regurgitation, severe tricuspid regurgitation with severe pulmonary hypertension, right ventricular systolic pressure of 53 mmHg.   3. Nonsustained ventricular tachycardia.   4. Abnormal Lexiscan nuclear  perfusion study with small partially reversible defect in the inferior apical segment on March 19, 2018.   5. Renal insufficiency.    6. Alcohol abuse - reportedly now with a glass of wine once a week.   7. Tobacco abuse.     RECOMMENDATIONS:   1. Reviewed importance of low-sodium diet less than 2000 mg per day.   2. 1.5 liter fluid restriction.   3. Will increase his Lasix to 80 mg twice per day for four to five days and then he will decrease back down to his normal dose of 40 mg twice  per day.   4. I am sending him for a comprehensive metabolic panel as he has palpable liver edge well below his right ribs, proBNP.   5. He will follow in the congestive heart failure clinic in two to three weeks.   6. I encouraged him to  establish care with a primary care physician as well as a nephrologist.   7. He will likely need a consultation with electrophysiology to discuss possible eventual implantable cardioverter-defibrillator implant.     Tana Felts, ANP      CJ/tuarg     cc: Cristal Ford MD   ____________________________   TODAYS ORDERS  Comprehensive Metabolic Panel Today  Brain Natriuretic Peptide Today  Heart Failure Clinic Visit [facility] 2 weeks

## 2020-10-31 NOTE — Progress Notes (Signed)
Chad Rubio    Date of Visit:  07/01/2018  Date of Birth: 05/13/1951  Age: 70 yrs.   Medical Record Number: [id number]  __  CURRENT DIAGNOSES     1. Nicotine dependence, cigarettes,  uncomplicated, F17.210  2. Hypertension (essential or benign or malignant), I10  3. Cardiomyopathy, Unspecified, I42.9  4. 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 Abnormal test-abnormal pharmacologic stress nuclear study, R94.30  __   ALLERGIES    No Known Drug Allergies  __  MEDICATIONS      1. isosorbide mononitrate ER 30 mg tablet,extended release 24 hr, 1 every day hold for sbp less than 100  2. furosemide 40 mg tablet, 1 tablet twice a day  3. Entresto 49 mg-51 mg tablet, one po bid  4. carvedilol 12.5 mg tablet,  take 1 tablet every 12 hours  __  CHIEF COMPLAINT/REASON FOR VISIT  Heart Failure Clinic Visit  __   HISTORY OF PRESENT ILLNESS  Chad Rubio is a pleasant 70 year old gentleman that has cardiomyopathy that is undifferentiated at this time. It is generally thought that it is nonischemic. However, he has  had a nuclear medicine study which showed a small area of reversible ischemia and he does have a number of risk factors for coronary artery disease.     He has a history of class III symptoms. However, since starting goal-directed medical therapy  with Entresto, Coreg, and Lasix, he feels much better now. His cardiomyopathy is thought to be possibly due to alcohol consumption, which he has cut back considerably. The echocardiogram from March 18, 2018 demonstrated an ejection fraction of 15% and  severe tricuspid regurgitation with severe pulmonary hypertension and right ventricular systolic pressure estimated at 53. He also has a history of nonsustained ventricular tachycardia and has an appointment scheduled with our electrophysiology clinic  on July 02, 2018. At that visit, he is planned to meet with Dr. Marianna Fuss to talk about ICD.     He has renal insufficiency with a creatinine of 1.6 on 02 June 2018.  Regarding other risk factors, he smokes cigarettes but has cut down to 4 per  day. He has no known family history of coronary artery disease or cardiomyopathy. He does not have diabetes.   __  PAST HISTORY      Past Medical Illnesses: Renal insufficiency, alcohol abuse, HTN;  Past Cardiac Illnesses : HFREF, Non sustained VT; Infectious Diseases: Usual childhood illnesses of mumps, measles and chickenpox;  Surgical Procedures: No previous surgical procedures.; Trauma History: No previous history of significant  trauma.; NYHA Classification: II; Cardiology Procedures-Invasive : No previous interventional or invasive cardiology procedures.; Cardiology Procedures-Noninvasive: Echocardiogram August 2019, MPI Single Isotope Lexiscan  August 2019; Left Ventricular Ejection Fraction: LVEF of 15% documented via echocardiogram on 03/18/2018   SOCIAL HISTORY    Alcohol Use: history of alcohol abuse, currently not drinking;  Smoking: smokes cigarettes,; Light tobacco smoker ([id number]); Diet: Regular diet and Caffeine use-1-2  per day; Exercise: No regular exercise;   __  REVIEW OF SYSTEMS     General: fatigue; Integumentary:  Denies any change in hair or nails, rashes, or skin lesions.; Eyes: wears eye glasses/contact lenses;  Ears, Nose, Throat, Mouth: Denies any hearing loss, epistaxis, hoarseness or difficulty speaking.;Respiratory : Denies dyspnea, cough, wheezing or hemoptysis.; Cardiovascular:  Please review HPI; Abdominal : Denies ulcer disease, hematochezia or melena.; Musculoskeletal:Denies any venous insufficiency, arthritic symptoms or back problems.; Neurological  : Denies any recurrent strokes, TIA, or seizure disorder.; Psychiatric:  Denies any  depression, substance abuse or change in cognitive functions.; Endocrine: Denies any weight  change, heat/cold intolerance, polydipsia, or polyuria; Hematologic/Immunologic: Denies any food allergies,  seasonal allergies, bleeding disorders.  __  PHYSICAL  EXAMINATION    Vital Signs :  Blood Pressure:        Constitutional:  Well developed, well nourished, in no acute distress Skin: Warm and dry to touch, no apparent skin lesions, or masses noted.  Head: normocephalic Eyes: conjunctivae and lids normal  ENT: Ears, Nose and throat reveal no gross abnormalities Neck : JVD at 45 degrees 4cm, positive hepato-jugular reflux Chest: clear to auscultation bilaterally  Cardiac: Regular rhythm, S1,S2, no S3 or S4 present, Apical impulse 5th ICS, left MCL, no lifts or thrills palpable, no murmurs Abdomen : liver percussed below the right costal margin approximately, positive hepato-jugular reflux, + Peripheral Pulses:  pulses full and equal in all extremities Extremities/Back: No clubbing, cyanosis or edema Neurological : No gross motor or sensory deficits noted, affect appropriate, oriented to time, person and place.   __    Medications added today by the physician:     IMPRESSIONS:  1. Cardiomyopathy with an ejection fraction of 15%, measured on 18 March 2018. He has class II symptoms, previously class III symptoms, ACC stage C. The etiology is possibly from alcohol  consumption.   2. He had a nuclear medicine study in August 2019, which showed a small partially reversible defect in the inferior apical segment. He has symptoms of shortness of breath if he walks too fast and the symptoms improve with rest. This  could be from cardiomyopathy. However, it could be suggestive of coronary artery disease. I think it is best, even though he has borderline renal function, to proceed with cardiac catheterization to rule out significant coronary artery disease.      RECOMMENDATIONS:  1. Goal-directed medical therapy.   2. Carvedilol will be increased to 12.5 mg twice daily.   3. [name] will continue at 49/51 mg twice daily.  4. Lasix will continue at  40 mg twice a day.   5. For hypertension, Coreg is being increased and isosorbide will continue and [name] will continue.   6. Left  heart cath is being completed to evaluate for coronary artery disease.   7. A right heart cath will be completed  to measure pulmonary hypertension and do a shunt run. On exam today, he has clubbing, which he has noted since junior high, and he has severe pulmonary hypertension on his echocardiogram.   8. Followup with our electrophysiologists as scheduled.    9. Followup with our clinic as soon as the cardiac catheterization is completed.     Marianna Fuss, MD, [facility]    TSW/tumam    cc: Cristal Ford MD

## 2020-10-31 NOTE — Progress Notes (Signed)
[place] OFFICE  [address] [place], [name]    Date of Visit:  06/17/2018  Date of Birth: May 30, 1951  Age: 70 yrs.   Medical Record Number: [id number]  __  CURRENT DIAGNOSES     1. Nicotine dependence, cigarettes,  uncomplicated, F17.210  2. Hypertension (essential or benign or malignant), I10  3. Cardiomyopathy, Unspecified, I42.9  4. 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 Abnormal test-abnormal pharmacologic stress nuclear study, R94.30  __   ALLERGIES    No Known Drug Allergies  __  MEDICATIONS      1. isosorbide mononitrate ER 30 mg tablet,extended release 24 hr, 1 every day hold for sbp less than 100  2. furosemide 40 mg tablet, 1 tablet twice a day  3. carvedilol 6.25 mg tablet, 1 tablet twice a day  4. Entresto 49 mg-51  mg tablet, one po bid  __  CHIEF COMPLAINT/REASON FOR VISIT  Followup of Cardiomyopathy, Unspecified and Followup of CHF chronic systolic   __  HISTORY OF PRESENT ILLNESS  Chad Rubio was seen in the office today in followup to the heart failure clinic. He is a delightful 70 year old gentleman,  followed by Dr. Cassell Smiles for a dilated cardiomyopathy. He was last seen in the office on May 26, 2018, by Yong Channel, NP in the heart failure clinic. At that time it was recommended he increase the Entresto to 49/51 b.i.d. and carvedilol to 12.5  mg b.i.d. Unfortunately, he was somewhat confused and did not make either of these changes. He had samples for the higher dose of Entresto and only took it for three days and then resumed the lower dose of Entresto at 24/26 mg daily. He did not make any  changes to his carvedilol. He denies any chest pain, shortness of breath, palpitations or other cardiac symptoms. He states he has considerably curtailed his alcohol use to no more than a glass of wine or beer per week. He states previously he was drinking  a significant amount of liquor primarily during the weekends in high doses. He has been consciously  trying to reduce his alcohol intake. He denies any chest pain, shortness of breath, PND or orthopnea. He appears euvolemic on exam. His blood pressure  today will allow for up titration of his Entresto. He does have some concerns about the cost of the medications, so I did provide samples and we will submit a form to the pharmaceutical company to see if he can get patient assistance. Last available labs  dated June 02, 2018, creatinine 1.6, BUN 21, sodium 142, potassium 3.5, total bilirubin 1.6. NT-proBNP 18,000.  __  PAST HISTORY      Past Medical Illnesses: Renal insufficiency, alcohol abuse, HTN;  Past Cardiac Illnesses : HFREF, Non sustained VT; Infectious Diseases: Usual childhood illnesses of mumps, measles and chickenpox;  Surgical Procedures: No previous surgical procedures.; Trauma History: No previous history of significant  trauma.; NYHA Classification: II; Cardiology Procedures-Invasive : No previous interventional or invasive cardiology procedures.; Cardiology Procedures-Noninvasive: Echocardiogram August 2019, MPI Single Isotope Lexiscan  August 2019; Left Ventricular Ejection Fraction: LVEF of 15% documented via echocardiogram on 03/18/2018   PHYSICAL EXAMINATION    Vital Signs:  Blood Pressure:   130/78 Sitting, Left arm, regular cuff  130/80 Sitting, Right arm, regular cuff    Weight: 160.00 lbs.   Height: 74.00"  BMI: 20.54   Pulse:  80/min.       Constitutional: Well developed, well nourished,  in no acute distress  Skin: Warm and dry to touch, no apparent skin lesions, or masses noted. Head:  normocephalic Eyes: conjunctivae and lids normal ENT : Ears, Nose and throat reveal no gross abnormalities Neck:  JVD at 45 degrees 4cm, positive hepato-jugular reflux Chest: clear to auscultation bilaterally Cardiac : Regular rhythm, S1,S2, no S3 or S4 present, Apical impulse 5th ICS, left MCL, no lifts or thrills palpable, no murmurs Abdomen: liver percussed below  the right costal margin approximately,  positive hepato-jugular reflux, + Peripheral Pulses: pulses full  and equal in all extremities Extremities/Back: No clubbing, cyanosis or edema Neurological : No gross motor or sensory deficits noted, affect appropriate, oriented to time, person and place.   __    Medications added today by the physician:  Entresto 49 mg-51 mg tablet, one po bid,  180      IMPRESSIONS:   1. Heart failure with reduced ejection fraction, New York Heart Association Class II/III. Grade II diastolic dysfunction.  Appears reasonably euvolemic on exam, EF of 15% by last echo March 18, 2018. Dry weight goal appears to be 160 based on his exam today. Curtailing alcohol.  2. Echocardiogram March 18, 2018, EF 50%, markedly dilated left ventricle with mild concentric  left ventricular hypertrophy, grade II diastolic dysfunction, dilated right ventricle with depressed right ventricular systolic function, mild mitral regurgitation, severe tricuspid regurgitation with severe pulmonary hypertension, right ventricular systolic  pressure 53 mmHg.  3. Nonsustained ventricular tachycardia. Already scheduled upcoming appointment with Dr. Marianna Fuss July 02, 2018, for consideration of ICD, although meds are not fully optimal at this time and likely he will be able to tolerate  additional up titration.  4. Abnormal Lexiscan nuclear study with a small partially reversible defect in the inferior apical segment March 19, 2018.  5. Renal insufficiency.  6. Alcohol abuse with history of binge drinking. Reportedly, now  one to two drinks per week.  7. Persistent tobacco abuse.     RECOMMENDATIONS:   1. We will up titrate the Entresto to 49/51 mg b.i.d. as had  been previously directed previously recommended. He is well aware to discontinue the lower dose of Entresto when he starts this higher dose. This was discussed with him, as well as written. I reviewed these instructions several times since he did have  some misunderstanding at the last visit.  2. Low  sodium diet was stressed.  3. Continue daily weights.  4. Followup BMP in one week to re-evaluate electrolytes and renal function on higher dose of Entresto.  5. He is scheduled for [facility]  Heart Failure Clinic consultation within the next three to four weeks with Dr. Jerald Kief. At that time we will likely be able to further up titrate his cardiomyopathy regimen by possibly increasing the carvedilol or the addition of Aldactone. He will need  reassessment of LV function two to three months after maximum tolerated guideline-directed medical therapy for his cardiomyopathy.  6. I did stress complete cessation of smoking, as well as discontinuation of all alcohol.  7. It does not appear  he had a right and left heart catheterization during the hospitalization, at least by available records. In view of his severe ischemic cardiomyopathy, this may be a consideration. However, I will defer to Dr. Jerald Kief at his upcoming visit.    Lavenia Atlas     Tid: [id number]    cc: Cristal Ford MD  ____________________________  TODAYS ORDERS  Basic Metabolic Panel  1 week

## 2020-10-31 NOTE — Progress Notes (Signed)
[place] OFFICE  [address] [place], [place] [address]     Chad Rubio    Date of Visit:  03/25/2018  Date of Birth: Apr 19, 1951  Age: 70 yrs.   Medical Record Number: [id number]  Referring Physician: [facility] MD, Shelda Altes  __   CURRENT DIAGNOSES     1. Hypertension (essential or benign or malignant), I10  2. Cardiomyopathy, Unspecified, I42.9  3. 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 Abnormal test-abnormal pharmacologic  stress nuclear study, R94.30  __  ALLERGIES     __  MEDICATIONS     1. carvedilol 6.25 mg tablet, 1 tablet twice a day  2. Entresto 24 mg-26 mg tablet, 1 tablet twice a day  3. furosemide  40 mg tablet, 1 tablet twice a day  4. isosorbide mononitrate ER 30 mg tablet,extended release 24 hr, 1 every day hold for sbp less than 100  __  CHIEF COMPLAINT/REASON FOR VISIT   Followup of Cardiomyopathy, Unspecified, Followup of CHF chronic systolic and Followup of Hypertension (essential or benign or malignant)  __  HISTORY OF PRESENT ILLNESS   Chad Rubio states he has been feeling better since his recent hospitalization. He has gradually increased his activity. He walks approximately two blocks before noting shortness of breath. This resolves promptly at rest. He has had no shortness of breath  at rest. He denies any chest pain, palpitations, lightheadedness, dizziness, or syncope. He denies paroxysmal nocturnal dyspnea. He does note some fatigue at the end of the day.   __  PAST HISTORY      Past Medical Illnesses: No previous history of significant medical illnesses.;  Past Cardiac Illnesses : No previous history of cardiac disease.; Infectious Diseases: Usual childhood illnesses of mumps, measles and chickenpox;  Surgical Procedures: No previous surgical procedures.; Trauma History: No previous history of significant  trauma.; Cardiology Procedures-Invasive: No previous interventional or invasive cardiology procedures.;  Cardiology Procedures-Noninvasive: Echocardiogram August 2019, MPI  Single Isotope Lexiscan August 2019; Left Ventricular Ejection Fraction : LVEF of 15% documented via echocardiogram on 03/18/2018  ___  FAMILY HISTORY   Mother -- Heart disease  PaternalGrandparent -- Heart disease     PHYSICAL EXAMINATION    Vital Signs:  Blood Pressure:   100/62 Sitting, Left arm, regular cuff  98/60 Sitting, Right arm, regular cuff    Weight: 158.00 lbs.   Height: 74"  BMI: 20.28   Pulse:  74/min.       Constitutional: Cooperative, alert and oriented,well developed, well nourished,  in no acute distress. Skin: Warm and dry to touch, no apparent skin lesions, or masses noted. Head : Normocephalic, normal hair pattern, no masses or tenderness Eyes: EOMS Intact, PERRL, conjunctivae and  lids normal. Funduscopic exam and visual fields not performed. ENT: Ears, Nose and throat reveal no gross  abnormalities. No pallor or cyanosis. Dentition good. Neck: No palpable masses or adenopathy, no thyromegaly,  no JVD, carotid pulses are full and equal bilaterally without bruits. Chest: Normal symmetry, no tenderness to palpation, normal respiratory excursion,  no intercostal retraction, no use of accessory muscles, normal diaphragmatic excursion, clear to auscultation and percussion. Cardiac: Regular rhythm,  S1,S2, no S3 or S4 present, Apical impulse 5th ICS, left MCL, no lifts or thrills palpable, no murmurs Abdomen: Abdomen soft, bowel sounds normoactive,  no masses, no hepatosplenomegaly, non-tender, no bruits Peripheral Pulses: The femoral, popliteal, dorsalis  pedis, and posterior tibial pulses are full and equal bilaterally with no bruits auscultated. Extremities/Back: No deformities, clubbing, cyanosis, erythema  or edema observed. There are no spinal abnormalities noted. Normal muscle strength and tone. Neurological:  No gross motor or sensory deficits noted, affect appropriate, oriented to time, person and place.   __    Medications added today by the physician:       IMPRESSIONS:  1. Dilated  cardiomyopathy - left ventricular ejection fraction less than 15%.   2. Recent admission for acute systolic congestive heart failure.   3. Hypertension/grade 2 diastolic  dysfunction.   4. Nonsustained ventricular tachycardia.   5. Mild aortic valve insufficiency.   6. Dilated right ventricle with depressed systolic function.   7. Mild mitral valve regurgitation/severely dilated left atrium.   8. Severe  tricuspid regurgitation, valve regurgitation/severely dilated right atrium.   9. Pulmonary hypertension with estimated right ventricular systolic pressure 53 mmHg.   10. Renal insufficiency   11. History of smoking - has cut down to three  cigarettes per day.   12. History of alcohol abuse - up to one fifth of alcohol on weekends. States he has had no   alcohol since June of this year.   13. Abnormal Lexiscan nuclear perfusion study with small partially reversible defect in  the   inferior apical segment.     RECOMMENDATIONS:  1. Discontinue smoking. He states he is trying to do so.   2. Continue current medical  regimen.   3. Consultation with electrophysiologist to discuss possible eventual ICD implantation.   4. Follow up in the Heart Failure Clinic at [facility] in approximately three weeks' time.   5. Follow up with nephrologist as ongoing.      Jeno Calleros P. Cassell Smiles, MD, [facility]    MPT/tumam    cc: Earlie Raveling MD  Mosie Lukes MD   Cristal Ford MD  ____________________________  TODAYS ORDERS  Tobacco_cessation_counseling_provided TODAY   Heart Failure Clinic Visit 3 weeks  12 Lead ECG Today  EP Consult 1 week

## 2020-10-31 NOTE — Progress Notes (Signed)
OFFICE  [address] [place], [name]    Date of Visit:  05/26/2018  Date of Birth: [birth year]  Age: 70 yrs.   Medical Record Number: [id number]  __  CURRENT DIAGNOSES     1. Nicotine dependence, cigarettes,  uncomplicated, F17.210  2. Hypertension (essential or benign or malignant), I10  3. Cardiomyopathy, Unspecified, I42.9  4. 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 Abnormal test-abnormal pharmacologic stress nuclear study, R94.30  __   ALLERGIES    __  MEDICATIONS      1. carvedilol 6.25 mg tablet, 1 tablet twice a day  2. Entresto 24 mg-26 mg tablet, 1 tablet twice a day  3. furosemide 40 mg tablet, 1 tablet twice a day  4. isosorbide mononitrate ER 30 mg tablet,extended release 24 hr, 1 every  day hold for sbp less than 100  __  CHIEF COMPLAINT/REASON FOR VISIT  Followup of Cardiomyopathy, Unspecified and Followup of CHF chronic systolic   __  HISTORY OF PRESENT ILLNESS  Chad Rubio is a pleasant 70 year old gentleman who was last seen in our office on March 25, 2018 following a hospitalization  for acute congestive heart failure. 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 During his hospitalization, he had an abnormal Lexiscan nuclear perfusion study with small partially reversible defect in the inferoapical  segment. He had an echocardiogram March 18, 2018 which showed LVEF 50% or less, markedly dilated left ventricle with mild concentric LVH, dilated right ventricle with depressed RV systolic function, mild MR and severe TR with severe pulmonary hypertension,  RVSP 53 mmHg. He was also found to have renal insufficiency during this hospitalization and was recommended to see Nephrology.    Unfortunately, he was not seen in follow-up until May 05, 2018 where he was significantly fluid overloaded with  a weight gain of 15 pounds. He had run out of his medications and was not following dietary restrictions. He had  not yet seen a primary care or a nephrologist. At that office visit, we reviewed dietary guidelines, adjusted his diuretic and gave him names  to primary care physicians.    He returns for follow-up. He has made great strides with his compliance with medical therapy. His weight is down about 10-12 pounds. He is taking his medications as directed. He sleeps on two pillows which is more  comfortable for him. He used to sleep on one. He has trace lower extremity edema on exam today. He continues to have hepatojugular reflux. He also has severe TR noted. He reports that he has decreased his drinking. He used to be a binge drinker. He has  also reduced his smoking - reportedly down to four cigarettes per day. He now has established care with a nephrologist but has not yet established a primary care physician.  __  PAST HISTORY      Past Medical Illnesses: No previous history of significant medical illnesses.;  Past Cardiac Illnesses : No previous history of cardiac disease.; Infectious Diseases: Usual childhood illnesses of mumps, measles and chickenpox;  Surgical Procedures: No previous surgical procedures.; Trauma History: No previous history of significant  trauma.; Cardiology Procedures-Invasive: No previous interventional or invasive cardiology procedures.;  Cardiology Procedures-Noninvasive: Echocardiogram August 2019, MPI Single Isotope Lexiscan August 2019; Left Ventricular Ejection Fraction : LVEF of 15% documented via echocardiogram on 03/18/2018  ___  FAMILY HISTORY  Mother --  Heart disease  PaternalGrandparent -- Heart disease  __  CARDIAC RISK FACTORS      Tobacco Abuse: currently smoking; Family History of Heart Disease: positive;  Hyperlipidemia: negative; Hypertension: positive;   Diabetes Mellitus: negative; Prior History of Heart Disease: negative;  Obesity: negative; Sedentary Life Style:negative;  [id number]  __  SOCIAL HISTORY     Alcohol Use: history of alcohol abuse, currently not drinking;  Smoking: smokes cigarettes,; Light tobacco  smoker ([id number]); Diet: Regular diet and Caffeine use-1-2 per day; Exercise : No regular exercise;   __  PHYSICAL EXAMINATION    Vital Signs:  Blood Pressure:   128/92 Sitting, Right arm, large cuff  130/90 Sitting, Left arm, large cuff    Weight: 160.00 lbs.   Height: 74.00"  BMI: 20.54   Pulse:  86/min.       Constitutional: cooperative, alert, oriented, short of breath with walking  Skin: Warm and dry to touch, no apparent skin lesions, or masses noted. Head: normocephalic  Eyes: conjunctivae and lids normal ENT: Ears, Nose and throat reveal no gross abnormalities  Neck: JVD improved Chest: diminished at bases Cardiac : Regular rhythm, S1,S2, no S3 or S4 present, Apical impulse 5th ICS, left MCL, no lifts or thrills palpable, no murmurs Abdomen: liver percussed below  the right costal margin approximately, positive hepato-jugular reflux, + Peripheral Pulses: pulses full and equal in all extremities  Extremities/Back: trace pretibial edema Neurological: No gross motor or sensory deficits noted, affect  appropriate, oriented to time, person and place.   __    Medications added today by the physician:      IMPRESSIONS:  Returns for evaluation and management of CHF  1. Acute on chronic systolic heart failure.   2. Echocardiogram  on March 18, 2018: left ventricular ejection fraction of 15%, markedly dilated left ventricle with mild concentric left ventricular hypertrophy, grade II diastolic dysfunction, dilated right ventricle with depressed right ventricular systolic function,  mild mitral regurgitation, severe tricuspid regurgitation with severe pulmonary hypertension, right ventricular systolic pressure of 53 mmHg.   3. Nonsustained ventricular tachycardia.   4. Abnormal Lexiscan nuclear perfusion study with small  partially reversible defect in the inferior apical segment on March 19, 2018.   5. Renal insufficiency.   6. Alcohol abuse - binge drinking -  reportedly now with a glass of wine once a week.   7. Tobacco abuse.     RECOMMENDATIONS:   1. We will increase his Entresto to 49/51 b.i.d.  2. He will increase his carvedilol to 12.5 mg b.i.d.  3. He will have a repeat CMP with proBNP in one week. He will continue with low sodium diet and fluid restriction of 1.5 liters per day.   4. If his labs and blood pressure are stable at his next office visit, we will add Aldactone.  5. He is encouraged to establish care with a primary care physician.  6. He is encouraged to keep his appointment as directed with Nephrology which  is coming up soon.  7. He will call our office with any questions or concerns.    Tana Felts, ANP    CJ/tubbh    cc: Cristal Ford MD  ____________________________  TODAYS ORDERS  Comprehensive Metabolic Panel  1 week  NT-proBNP 1 week  Heart Failure Clinic Visit 2 weeks  Advanced CHF Consult 45 Min 1 month

## 2020-10-31 NOTE — Consults (Signed)
Chad Rubio    Date of Visit:  07/02/2018  Date of Birth: 1950-09-24  Age: 70 yrs.   Medical Record Number: [id number]  __  CURRENT DIAGNOSES     1. Nicotine dependence, cigarettes,  uncomplicated, F17.210  2. Hypertension (essential or benign or malignant), I10  3. Cardiomyopathy, Unspecified, I42.9  4. 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 Abnormal test-abnormal pharmacologic stress nuclear study, R94.30  __   ALLERGIES    No Known Drug Allergies  __  MEDICATIONS      1. carvedilol 12.5 mg tablet, take 1 tablet every 12 hours  2. Entresto 49 mg-51 mg tablet, one po bid  3. furosemide 40 mg tablet, 1 tablet twice a day  4. isosorbide mononitrate ER 30 mg tablet,extended release 24 hr, 1 every  day hold for sbp less than 100  __  CHIEF COMPLAINT/REASON FOR VISIT  ep consult re ?icd on basis of cm/chf  __   HISTORY OF PRESENT ILLNESS  Chad Rubio is a pleasant, 70 year old gentleman who presents on referral from Dr. Cassell Smiles for an electrophysiologic consultation regarding the evaluation and management  of severe cardiomyopathy, for consideration of a possible primary prevention implantable cardioverter defibrillator. He also apparently had nonsustained ventricular tachycardia noted at one point. He presented to the hospital with shortness of breath  back in August and an echocardiogram at that time showed severe left ventricular dysfunction with an ejection fraction of only 15%. He did undergo a stress nuclear study at the time with a small partially reversible defect and he was started on the usual  guideline directed medical therapy and advised to cut back on drinking alcohol. He thinks he is feeling better, although not back to baseline. He has not had recent chest pain, syncope, or significant palpitations. He saw Dr. Webb Silversmith from advanced heart  failure just yesterday and plans are in place next week for a heart catheterization and he is also going to repeat an echocardiogram.   __  PAST HISTORY       Past Medical Illnesses: Renal insufficiency, alcohol abuse, HTN;  Past Cardiac Illnesses : HFREF, Non sustained VT; Infectious Diseases: Usual childhood illnesses of mumps, measles and chickenpox;  Surgical Procedures: No previous surgical procedures.; Trauma History: No previous history of significant  trauma.; NYHA Classification: II; Cardiology Procedures-Invasive : No previous interventional or invasive cardiology procedures.; Cardiology Procedures-Noninvasive: Echocardiogram August 2019, MPI Single Isotope Lexiscan  August 2019; Left Ventricular Ejection Fraction: LVEF of 15% documented via echocardiogram on 03/18/2018   ___  FAMILY HISTORY  Mother -- Heart disease  PaternalGrandparent --  Heart disease    __  CARDIAC RISK FACTORS     Tobacco Abuse: currently smoking;  Family History of Heart Disease: positive; Hyperlipidemia: negative;  Hypertension: positive;  Diabetes Mellitus: negative;  Prior History of Heart Disease: negative; Obesity: negative;  Sedentary Life Style:negative; [id number]  __   SOCIAL HISTORY    Alcohol Use: history of alcohol abuse, currently not drinking;  Smoking: smokes cigarettes,; Light tobacco smoker ([id number]); Diet: Regular diet and Caffeine  use-1-2 per day; Exercise: No regular exercise;   __  REVIEW OF SYSTEMS     General: fatigue; Integumentary: Denies any change in hair or nails, rashes, or skin lesions.;  Eyes: wears eye glasses/contact lenses; Ears, Nose, Throat, Mouth: Denies any hearing loss, epistaxis,  hoarseness or difficulty speaking.;Respiratory: Denies dyspnea, cough, wheezing or hemoptysis.; Cardiovascular : Please review HPI; Abdominal : Denies ulcer disease, hematochezia or melena.;Musculoskeletal :Denies any venous insufficiency, arthritic  symptoms or back problems.; Neurological : Denies any recurrent strokes, TIA, or seizure disorder.;  Psychiatric: Denies any depression, substance abuse or change in cognitive functions.; Endocrine: Denies  any weight  change, heat/cold intolerance, polydipsia, or polyuria; Hematologic/Immunologic: Denies any food allergies, seasonal allergies, bleeding disorders.   __  PHYSICAL EXAMINATION    Vital Signs: reviewed  Blood Pressure:         Constitutional: Well developed, well nourished, in no acute distress Skin: Warm and dry to touch, no  apparent skin lesions, or masses noted. Head: normocephalic, no masses, no skin lesions, non tender to palpation  Eyes: conjunctivae and lids normal ENT: Ears, Nose and throat reveal no gross abnormalities  Neck: JVD at 45 degrees 4cm, positive hepato-jugular reflux Chest: clear to auscultation bilaterally,  Normal symmetry, no intercostal retraction, no use of accessory muscles Cardiac: Regular rhythm, S1,S2, no S3 or S4 present, Apical impulse 5th ICS,  left MCL, no lifts or thrills palpable, no murmurs Abdomen: abdomen normal, abdomen soft, bowel sounds normoactive, non-tender  Peripheral Pulses: pulses full and equal in all extremities Extremities/Back: No clubbing, cyanosis  or edema Neurological: No gross motor or sensory deficits noted, affect appropriate, oriented to time, person and place.   __    IMPRESSIONS:   1. Very severe cardiomyopathy, diagnosed nearly four months ago. The etiology is possibly alcoholic although a specific etiology is not known. Cardiac catheterization is planned next week to rule out significant ischemic component.   2. Relatively  recent nuclear stress test as mentioned.   3. History of apparent excessive alcohol ingestion. He has cut back.  4. Sinus rhythm with narrow QRS.  5. History of chronic renal insufficiency according to the records.  6. History of nonsustained  ventricular tachycardia.     PLAN:  He was not sure why he was here but I explained it was because of the cardiomyopathy and nonsustained ventricular tachycardia for consideration of a possible defibrillator. He mentioned that he had a family  member with one, or at least a pacemaker, so he  feels he has some familiarity with living with such a device. I explained that it would be somewhat premature to proceed at present because we have not reassessed his ejection fraction since the initial  diagnosis back in August. That will be accomplished in the next week or two and I explained if there is no reversible cause found and if his left ventricular function remains 35% or less that he is a candidate for primary prevention implantable cardioverter  defibrillator on the basis of SCD-HEFT trial data. I explained what the defibrillator would and would not do. Without a final decision, I asked if he would consider it if after completion of testing, he is a candidate. He tells me he would like to proceed  knowing what he does and how he wants to do what he can for his health and live as long as possible. I educated him further about the device should it come to that and the surgery involved and answered his several good questions. We ultimately will close  the loop after the upcoming testing and again, if ejection fraction remains severely depressed then probably arrange for primary prevention implantable cardioverter defibrillator at that time.     Charlcie Prisco L. Marianna Fuss, M.D.     RLM/tubks     cc: Cristal Ford MD    MG    ds

## 2020-11-20 ENCOUNTER — Inpatient Hospital Stay (HOSPITAL_COMMUNITY)
Admission: EM | Admit: 2020-11-20 | Discharge: 2020-11-24 | DRG: 291 | Disposition: A | Discharge status: Home / Self Care | Payer: Medicare HMO | Attending: Family Medicine | Admitting: Family Medicine

## 2020-11-20 ENCOUNTER — Emergency Department (HOSPITAL_COMMUNITY): Payer: Medicare HMO

## 2020-11-20 ENCOUNTER — Other Ambulatory Visit: Payer: Self-pay

## 2020-11-20 ENCOUNTER — Encounter (HOSPITAL_COMMUNITY): Payer: Self-pay

## 2020-11-20 DIAGNOSIS — I426 Alcoholic cardiomyopathy: Secondary | ICD-10-CM | POA: Diagnosis present

## 2020-11-20 DIAGNOSIS — D638 Anemia in other chronic diseases classified elsewhere: Secondary | ICD-10-CM | POA: Diagnosis present

## 2020-11-20 DIAGNOSIS — I509 Heart failure, unspecified: Secondary | ICD-10-CM

## 2020-11-20 DIAGNOSIS — E876 Hypokalemia: Secondary | ICD-10-CM | POA: Diagnosis not present

## 2020-11-20 DIAGNOSIS — I5043 Acute on chronic combined systolic (congestive) and diastolic (congestive) heart failure: Secondary | ICD-10-CM | POA: Diagnosis present

## 2020-11-20 DIAGNOSIS — Z79899 Other long term (current) drug therapy: Secondary | ICD-10-CM | POA: Diagnosis not present

## 2020-11-20 DIAGNOSIS — N400 Enlarged prostate without lower urinary tract symptoms: Secondary | ICD-10-CM | POA: Diagnosis present

## 2020-11-20 DIAGNOSIS — I493 Ventricular premature depolarization: Secondary | ICD-10-CM | POA: Diagnosis present

## 2020-11-20 DIAGNOSIS — F17290 Nicotine dependence, other tobacco product, uncomplicated: Secondary | ICD-10-CM | POA: Diagnosis present

## 2020-11-20 DIAGNOSIS — E43 Unspecified severe protein-calorie malnutrition: Secondary | ICD-10-CM

## 2020-11-20 DIAGNOSIS — Z87442 Personal history of urinary calculi: Secondary | ICD-10-CM | POA: Diagnosis not present

## 2020-11-20 DIAGNOSIS — I2510 Atherosclerotic heart disease of native coronary artery without angina pectoris: Secondary | ICD-10-CM

## 2020-11-20 DIAGNOSIS — I252 Old myocardial infarction: Secondary | ICD-10-CM

## 2020-11-20 DIAGNOSIS — Y929 Unspecified place or not applicable: Secondary | ICD-10-CM | POA: Diagnosis not present

## 2020-11-20 DIAGNOSIS — Z681 Body mass index (BMI) 19 or less, adult: Secondary | ICD-10-CM

## 2020-11-20 DIAGNOSIS — Z20822 Contact with and (suspected) exposure to covid-19: Secondary | ICD-10-CM | POA: Diagnosis present

## 2020-11-20 DIAGNOSIS — N179 Acute kidney failure, unspecified: Secondary | ICD-10-CM | POA: Diagnosis present

## 2020-11-20 DIAGNOSIS — I130 Hypertensive heart and chronic kidney disease with heart failure and stage 1 through stage 4 chronic kidney disease, or unspecified chronic kidney disease: Secondary | ICD-10-CM | POA: Diagnosis present

## 2020-11-20 DIAGNOSIS — I5042 Chronic combined systolic (congestive) and diastolic (congestive) heart failure: Secondary | ICD-10-CM | POA: Diagnosis present

## 2020-11-20 DIAGNOSIS — I2489 Other forms of acute ischemic heart disease: Secondary | ICD-10-CM

## 2020-11-20 DIAGNOSIS — N200 Calculus of kidney: Secondary | ICD-10-CM | POA: Diagnosis present

## 2020-11-20 DIAGNOSIS — I5023 Acute on chronic systolic (congestive) heart failure: Secondary | ICD-10-CM | POA: Diagnosis not present

## 2020-11-20 DIAGNOSIS — N1831 Chronic kidney disease, stage 3a: Secondary | ICD-10-CM | POA: Diagnosis present

## 2020-11-20 DIAGNOSIS — N1832 Chronic kidney disease, stage 3b: Secondary | ICD-10-CM | POA: Diagnosis not present

## 2020-11-20 DIAGNOSIS — R8271 Bacteriuria: Secondary | ICD-10-CM | POA: Diagnosis present

## 2020-11-20 DIAGNOSIS — R8281 Pyuria: Secondary | ICD-10-CM

## 2020-11-20 DIAGNOSIS — I248 Other forms of acute ischemic heart disease: Secondary | ICD-10-CM | POA: Diagnosis present

## 2020-11-20 DIAGNOSIS — I5021 Acute systolic (congestive) heart failure: Secondary | ICD-10-CM | POA: Diagnosis not present

## 2020-11-20 DIAGNOSIS — N184 Chronic kidney disease, stage 4 (severe): Secondary | ICD-10-CM

## 2020-11-20 DIAGNOSIS — T502X5A Adverse effect of carbonic-anhydrase inhibitors, benzothiadiazides and other diuretics, initial encounter: Secondary | ICD-10-CM | POA: Diagnosis present

## 2020-11-20 DIAGNOSIS — N183 Chronic kidney disease, stage 3 unspecified: Secondary | ICD-10-CM

## 2020-11-20 DIAGNOSIS — E785 Hyperlipidemia, unspecified: Secondary | ICD-10-CM | POA: Diagnosis present

## 2020-11-20 DIAGNOSIS — N390 Urinary tract infection, site not specified: Secondary | ICD-10-CM

## 2020-11-20 HISTORY — DX: Hyperlipidemia, unspecified: E78.5

## 2020-11-20 HISTORY — DX: Essential (primary) hypertension: I10

## 2020-11-20 HISTORY — DX: Non-ST elevation (NSTEMI) myocardial infarction: I21.4

## 2020-11-20 LAB — URINALYSIS, ROUTINE W REFLEX MICROSCOPIC
Bilirubin Urine: NEGATIVE
Glucose, UA: NEGATIVE mg/dL
Ketones, ur: NEGATIVE mg/dL
Nitrite: NEGATIVE
Protein, ur: 100 mg/dL — AB
RBC / HPF: >50 RBC/hpf — ABNORMAL HIGH (ref 0–5)
Specific Gravity, Urine: 1.016 (ref 1.005–1.030)
WBC, UA: >50 WBC/hpf — ABNORMAL HIGH (ref 0–5)
pH: 6 (ref 5.0–8.0)

## 2020-11-20 LAB — COMPREHENSIVE METABOLIC PANEL
ALT: 18 U/L (ref 0–44)
AST: 28 U/L (ref 15–41)
Albumin: 3.2 g/dL — ABNORMAL LOW (ref 3.5–5.0)
Alkaline Phosphatase: 49 U/L (ref 38–126)
Anion gap: 7 (ref 5–15)
BUN: 19 mg/dL (ref 8–23)
CO2: 24 mmol/L (ref 22–32)
Calcium: 8.6 mg/dL — ABNORMAL LOW (ref 8.9–10.3)
Chloride: 112 mmol/L — ABNORMAL HIGH (ref 98–111)
Creatinine, Ser: 1.48 mg/dL — ABNORMAL HIGH (ref 0.61–1.24)
GFR, Estimated: 51 mL/min — ABNORMAL LOW (ref >60)
Glucose, Bld: 150 mg/dL — ABNORMAL HIGH (ref 70–99)
Potassium: 3.9 mmol/L (ref 3.5–5.1)
Sodium: 143 mmol/L (ref 135–145)
Total Bilirubin: 1.2 mg/dL (ref 0.3–1.2)
Total Protein: 7.4 g/dL (ref 6.5–8.1)

## 2020-11-20 LAB — RESP PANEL BY RT-PCR (FLU A&B, COVID) ARPGX2
Influenza A by PCR: NEGATIVE
Influenza B by PCR: NEGATIVE
SARS Coronavirus 2 by RT PCR: NEGATIVE

## 2020-11-20 LAB — BRAIN NATRIURETIC PEPTIDE: B Natriuretic Peptide: >4500 pg/mL — ABNORMAL HIGH (ref 0.0–100.0)

## 2020-11-20 LAB — CBC WITH DIFFERENTIAL/PLATELET
Abs Immature Granulocytes: 0.01 10*3/uL (ref 0.00–0.07)
Basophils Absolute: 0 10*3/uL (ref 0.0–0.1)
Basophils Relative: 1 %
Eosinophils Absolute: 0 10*3/uL (ref 0.0–0.5)
Eosinophils Relative: 1 %
HCT: 32.7 % — ABNORMAL LOW (ref 39.0–52.0)
Hemoglobin: 10.2 g/dL — ABNORMAL LOW (ref 13.0–17.0)
Immature Granulocytes: 0 %
Lymphocytes Relative: 27 %
Lymphs Abs: 1.1 10*3/uL (ref 0.7–4.0)
MCH: 29 pg (ref 26.0–34.0)
MCHC: 31.2 g/dL (ref 30.0–36.0)
MCV: 92.9 fL (ref 80.0–100.0)
Monocytes Absolute: 0.5 10*3/uL (ref 0.1–1.0)
Monocytes Relative: 11 %
Neutro Abs: 2.5 10*3/uL (ref 1.7–7.7)
Neutrophils Relative %: 60 %
Platelets: 96 10*3/uL — ABNORMAL LOW (ref 150–400)
RBC: 3.52 MIL/uL — ABNORMAL LOW (ref 4.22–5.81)
RDW: 15.5 % (ref 11.5–15.5)
WBC: 4.2 10*3/uL (ref 4.0–10.5)
nRBC: 0 % (ref 0.0–0.2)

## 2020-11-20 LAB — TROPONIN I (HIGH SENSITIVITY)
Troponin I (High Sensitivity): 47 ng/L — ABNORMAL HIGH (ref <18)
Troponin I (High Sensitivity): 54 ng/L — ABNORMAL HIGH (ref <18)

## 2020-11-20 MED ORDER — CEPHALEXIN 500 MG PO CAPS
500.0000 mg | ORAL_CAPSULE | Freq: Once | ORAL | Status: AC
  Administered 2020-11-20: 500 mg via ORAL
  Filled 2020-11-20: qty 1

## 2020-11-20 MED ORDER — FUROSEMIDE 10 MG/ML IJ SOLN
40.0000 mg | Freq: Once | INTRAMUSCULAR | Status: AC
  Administered 2020-11-20: 40 mg via INTRAVENOUS
  Filled 2020-11-20: qty 4

## 2020-11-20 MED ORDER — HYDRALAZINE HCL 25 MG PO TABS: 25.0000 mg | ORAL_TABLET | ORAL | Status: DC | PRN

## 2020-11-20 MED ORDER — SODIUM CHLORIDE 0.9 % IV SOLN
1.0000 g | Freq: Once | INTRAVENOUS | Status: AC
  Administered 2020-11-20: 1 g via INTRAVENOUS
  Filled 2020-11-20: qty 10

## 2020-11-20 MED ORDER — LABETALOL HCL 5 MG/ML IV SOLN
10.0000 mg | INTRAVENOUS | Status: DC | PRN
  Administered 2020-11-21: 10 mg via INTRAVENOUS
  Filled 2020-11-20: qty 4

## 2020-11-20 NOTE — ED Triage Notes (Signed)
Emergency Medicine Provider Triage Evaluation Note  Joseph Tanner , a 70 y.o. male  was evaluated in triage.  Pt complains of hematuria.  Patient states he started experiencing dark urine yesterday evening around 7 PM.  He states that initially started dark and is now more brown.  No dysuria.  Also complaining of intermittent shortness of breath.  Worse with exertion during the day.  Also complains of shortness of breath at night.  No orthopnea.  No leg swelling.  No hemoptysis.  Patient recently moved to the area from [place].  He was admitted in a hospital in [place] from March 19 through March 23 for NSTEMI.  He was discharged on aspirin, atorvastatin, Keflex, BiDil, as well as carvedilol.  He was taken off of furosemide as well as isosorbide mononitrate at that admission.  He is currently only taking atorvastatin as well as carvedilol.  He states he has not been evaluated by a physician since being discharged from the hospital.  Physical Exam  BP (!) 126/91   Pulse 80   Temp 98 F (36.7 C) (Oral)   Resp (!) 22   Ht 6\' 3"  (1.905 m)   Wt 79.4 kg   SpO2 100%   BMI 21.87 kg/m  Gen:   Awake, no distress   HEENT:  Atraumatic  Resp:  Normal effort  Cardiac:  Normal rate  Abd:   Nondistended, nontender  MSK:   Moves extremities without difficulty  Neuro:  Speech clear   Medical Decision Making  Medically screening exam initiated at 2:10 PM.  Appropriate orders placed.  [place] was informed that the remainder of the evaluation will be completed by another provider, this initial triage assessment does not replace that evaluation, and the importance of remaining in the ED until their evaluation is complete.   Constance Holster, PA-C 11/20/20 1420

## 2020-11-20 NOTE — ED Provider Notes (Signed)
[facility]-EMERGENCY DEPT Provider Note   CSN: [id number] Arrival date & time: 11/20/20  1331     History Chief Complaint  Patient presents with  . Hematuria  . Shortness of Breath    Joseph Tanner is a 70 y.o. male.  HPI   Patient presents to the emergency room for evaluation of 2 issues.  Patient states he noticed some dark-colored urine that started yesterday evening.  At one point was also red-tinged.  He was not having any pain.  He is not having any fevers or chills.  Patient states he was in the hospital recently and was told he had a possible urine infection.  He was also told he should follow-up with a kidney doctor as well as urologist at some point.  Patient also states he has been having some intermittent episodes of feeling shortness of breath.  At times is worse with exertion during the day but he also has episodes where he will occur at night.  They seem to resolve spontaneously and right now he is not having any troubles with shortness of breath.  He has not noticed any leg swelling.  No fevers.  No coughing.  Patient was admitted to the hospital in [place], [facility].  Patient states he has subsequently moved.  He has not seen anyone since he left the hospital.  He was admitted on March 19 of this year  Past Medical History:  Diagnosis Date  . Hyperlipemia   . Hypertension   . NSTEMI (non-ST elevated myocardial infarction) (HCC)     There are no problems to display for this patient.   History reviewed. No pertinent surgical history.     History reviewed. No pertinent family history.  Social History   Tobacco Use  . Smoking status: Current Some Day Smoker  . Smokeless tobacco: Never Used  Substance Use Topics  . Alcohol use: Yes  . Drug use: Never    Home Medications Prior to Admission medications   Not on File    Allergies    Patient has no known allergies.  Review of Systems    Review of Systems  All other systems reviewed and are negative.   Physical Exam Updated Vital Signs BP (!) 152/95   Pulse 86   Temp (!) 97.5 F (36.4 C) (Oral)   Resp (!) 21   Ht 1.905 m (6\' 3" )   Wt 79.4 kg   SpO2 100%   BMI 21.87 kg/m   Physical Exam Vitals and nursing note reviewed.  Constitutional:      General: He is not in acute distress.    Appearance: He is well-developed.  HENT:     Head: Normocephalic and atraumatic.     Right Ear: External ear normal.     Left Ear: External ear normal.  Eyes:     General: No scleral icterus.       Right eye: No discharge.        Left eye: No discharge.     Conjunctiva/sclera: Conjunctivae normal.  Neck:     Trachea: No tracheal deviation.  Cardiovascular:     Rate and Rhythm: Normal rate and regular rhythm.  Pulmonary:     Effort: Pulmonary effort is normal. No respiratory distress.     Breath sounds: Normal breath sounds. No stridor. No wheezing or rales.  Abdominal:     General: Bowel sounds are normal. There is no distension.     Palpations: Abdomen is soft.  Tenderness: There is no abdominal tenderness. There is no guarding or rebound.  Musculoskeletal:        General: No tenderness.     Cervical back: Neck supple.  Skin:    General: Skin is warm and dry.     Findings: No rash.  Neurological:     Mental Status: He is alert.     Cranial Nerves: No cranial nerve deficit (no facial droop, extraocular movements intact, no slurred speech).     Sensory: No sensory deficit.     Motor: No abnormal muscle tone or seizure activity.     Coordination: Coordination normal.     ED Results / Procedures / Treatments   Labs (all labs ordered are listed, but only abnormal results are displayed) Labs Reviewed  COMPREHENSIVE METABOLIC PANEL - Abnormal; Notable for the following components:      Result Value   Chloride 112 (*)    Glucose, Bld 150 (*)    Creatinine, Ser 1.48 (*)    Calcium 8.6 (*)    Albumin 3.2 (*)     GFR, Estimated 51 (*)    All other components within normal limits  CBC WITH DIFFERENTIAL/PLATELET - Abnormal; Notable for the following components:   RBC 3.52 (*)    Hemoglobin 10.2 (*)    HCT 32.7 (*)    Platelets 96 (*)    All other components within normal limits  URINALYSIS, ROUTINE W REFLEX MICROSCOPIC - Abnormal; Notable for the following components:   Color, Urine AMBER (*)    APPearance CLOUDY (*)    Hgb urine dipstick LARGE (*)    Protein, ur 100 (*)    Leukocytes,Ua MODERATE (*)    RBC / HPF >50 (*)    WBC, UA >50 (*)    Bacteria, UA RARE (*)    All other components within normal limits  BRAIN NATRIURETIC PEPTIDE - Abnormal; Notable for the following components:   B Natriuretic Peptide >4,500.0 (*)    All other components within normal limits  TROPONIN I (HIGH SENSITIVITY) - Abnormal; Notable for the following components:   Troponin I (High Sensitivity) 47 (*)    All other components within normal limits  TROPONIN I (HIGH SENSITIVITY) - Abnormal; Notable for the following components:   Troponin I (High Sensitivity) 54 (*)    All other components within normal limits  URINE CULTURE  RESP PANEL BY RT-PCR (FLU A&B, COVID) [id number]    EKG EKG Interpretation  Date/Time:  Monday November 20 2020 13:46:50 EDT Ventricular Rate:  84 PR Interval:  182 QRS Duration: 117 QT Interval:  415 QTC Calculation: 491 R Axis:   4 Text Interpretation: Sinus rhythm Atrial premature complexes Probable left atrial enlargement LVH with secondary repolarization abnormality Borderline prolonged QT interval No old tracing to compare Confirmed by Linwood Dibbles [phone]) on 11/20/2020 6:37:09 PM   Radiology DG Chest Portable 1 View  Result Date: 11/20/2020 CLINICAL DATA:  Shortness of breath. EXAM: PORTABLE CHEST 1 VIEW COMPARISON:  None. FINDINGS: Mild cardiomegaly. Normal aortic contours. There is mild bilateral hilar prominence. Mild peribronchial thickening. Subsegmental atelectasis in the  left mid lung. No significant pleural effusion. No pneumothorax. No acute osseous abnormalities are seen. IMPRESSION: 1. Mild cardiomegaly. Mild peribronchial thickening of unknown chronicity. 2. Mild bilateral hilar prominence, likely prominent pulmonary arteries. Electronically Signed   By: Narda Rutherford M.D.   On: 11/20/2020 15:13    Procedures Procedures   Medications Ordered in ED Medications  cefTRIAXone (ROCEPHIN) 1 g in  sodium chloride 0.9 % 100 mL IVPB (1 g Intravenous New Bag/Given 11/20/20 1849)  cephALEXin (KEFLEX) capsule 500 mg (500 mg Oral Given 11/20/20 1803)  furosemide (LASIX) injection 40 mg (40 mg Intravenous Given 11/20/20 1848)    ED Course  I have reviewed the triage vital signs and the nursing notes.  Pertinent labs & imaging results that were available during my care of the patient were reviewed by me and considered in my medical decision making (see chart for details).  Clinical Course as of 11/20/20 1915  Mon Nov 20, 2020  1742 Urinalysis does suggest infection [JK]  1743 Chest x-ray shows mild cardiomegaly  [JK]  1744 Troponin was 0.59 at discharge on March 20 at [facility] [JK]  1835 BMP increased from 47-54 but I do not think this is clinically significant.  Appears similar to previous discharge troponins. [JK]  1836 Patient's BNP is significantly elevated at 4500. [JK]    Clinical Course User Index [JK] Linwood Dibbles, MD   MDM Rules/Calculators/A&P                         Care everywhere records were reviewed.  Patient was diagnosed with cardiomyopathy as well as nephro calcinosis with UTI.  Patient did have laboratory test that showed an elevated troponin 0.1 he also had CT abdomen pelvis that showed an obstructing right-sided kidney stone.  Patient had an echocardiogram during his hospital that showed 25% ejection fraction.  Patient apparently had a cardiac catheterization that showed disease but does not appear that any interventions were  done.  ICD was recommended but patient declined.  Patient did not have any nephrostomy or stent placed.  He is was noted to have an AKI with a creatinine of 1.5 on discharge  Patient's ED work-up here does show persistently elevated troponin as well as significantly elevated BNP.  I suspect his troponin elevation is related to the CHF and not acute coronary syndrome at this time.  Patient's urinalysis does show persistent signs of urinary tract infection.  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  It is possible some of these findings may be more chronic but I am concerned about an acute on chronic CHF exacerbation.  I do think he would benefit from hospitalization and further treatment. Final Clinical Impression(s) / ED Diagnoses Final diagnoses:  Acute congestive heart failure, unspecified heart failure type (HCC)  Urinary tract infection without hematuria, site unspecified      Linwood Dibbles, MD 11/20/20 [id number]

## 2020-11-20 NOTE — ED Notes (Signed)
Patient provided with hand urinal for future need after lasix

## 2020-11-20 NOTE — H&P (Signed)
History and Physical    Joseph Tanner  ONG:[id number]  DOB: Oct 24, 1950  DOA: 11/20/2020  PCP: Pcp, No Patient coming from: home  Chief Complaint: SOB  HPI:  Joseph Tanner is a 70 yo AA male with PMH combined systolic/diastolic CHF, CAD, nephrolithiasis, HTN who presented to the hospital with worsening shortness of breath and increased swelling in his abdomen.  Patient is slightly a poor historian.  Much of prior history is obtained from review of care everywhere. He recently moved from [place], [place] to [place] approximately 1 week ago.  [place] to 10/18/20 for "cardiomyopathy" and "nephrocalcinosis with UTI".  He was scheduled for follow-up visits in [place] but when asked if they knew he was moving he does state yes however upon moving here he has not yet established care with any providers. Work-up in [place] notable for echo revealing EF <25%, severe global hypokinesis, RV function mod/severely reduced. Grade 1 DD.  LHC 10/17/20: LV dysfunction out of proportion to CAD. Severe stenosis of small LCx, mild/mod stenosis of large RCA (heavily calcified). No interventions were performed.  Cath report stated that revascularization was not felt to benefit his LV function.  He was recommended to continue on GDMT and consider ICD (latter of which he declined at that time).   He now presents with worsening shortness of breath.  He states he takes a blood pressure pill and his "water pill" since discharge.   He endorses waking up in the middle of the night because of his shortness of breath and states he has been sleeping on 2-3 pillows.  He notices that his stomach is tighter than it used to be especially after eating. He has recently stopped smoking cigarettes approximately 2 months ago but has a 25-pack-year history.  He does continue to smoke cigars however. He also endorses significant social drinking mostly on the weekends to the point of  becoming intoxicated he says.  He endorses his last drink was approximately 4 months ago.  He finished a course of antibiotics for his UTI.  CT abdomen/pelvis on 10/14/2020 showed "Obstructing left ureteral 7 x 3 mm calculus is noted at the upper L4 vertebral level. This causes mild left hydronephrosis".   On workup his BP was mildly uncontrolled 140s-150s / 90-110s.  Na 143, K 3.9, Creat 1.48, BUN 19 WBC 4.2, Hgb 10.2, PLTC 96 BNP > 4500 Trop 47 >> 54 UA Lgb Hgb, 100 protein, mod LE, neg nitrite, >50 RBC, >50 WBC, rare bacteria  I have personally briefly reviewed patient's old medical records in [facility] and discussed patient with the ER provider when appropriate/indicated.  Assessment/Plan:   Acute on chronic combined systolic and diastolic CHF - EF <25%, Gr 1 DD on recent echo; s/p LHC 10/17/20. Per report " revascularization not felt to benefit his LV function" - recently moved to the area and has not established care; will need cardiology consult in am - continue lasix - strict I&O - repeat echo - patient declined ICD when offered in [place] but may need further discussion regarding this - continue asa, coreg - hold ARB until renal function trended further; this may be his baseline; would also be candidate for Entresto and others  CAD - see above; does have coronary stenosis but intervention during cath was not felt to be of benefit at the time - follow up further cardiology evaluation as well - continue asa, statin  HTN - continue coreg - labetalol or hydralazine PRN  as well  - further adjustments to follow   CKD3a - likely baseline of 1.5 creat; however also had a ureteral stone per [place] records and creat improved without need for stent or nephrostomy tube - currently 1.48 on admission - watch renal function on lasix - obtain renal u/s given history of "Left nephrolithiasis. Obstructing left ureteral 7 x 3 mm calculus is noted at the upper L4 vertebral level. This causes  mild left hydronephrosis." as noted on CT abd/pelvis on 10/14/20 in careeverywhere  Asymptomatic bacteruria Nephrolithiasis - patient only endorses darker urine but no symptoms, especially dysuria, frequency, hesitancy, urgency - follow up culture but hold off on treatment for now - follow up u/s  Normocytic anemia - check iron studies and folate/B12 - probable component of anemia of chronic disease as well  Code Status: Full  DVT Prophylaxis: Lovenox enoxaparin (LOVENOX) injection 40 mg Start: 11/21/20 1000   Anticipated disposition is to: home  History: Past Medical History:  Diagnosis Date  . Hyperlipemia   . Hypertension   . NSTEMI (non-ST elevated myocardial infarction) [facility])     History reviewed. No pertinent surgical history.   reports that he has been smoking. He has never used smokeless tobacco. He reports current alcohol use. He reports that he does not use drugs.  No Known Allergies  History reviewed. No pertinent family history.  Home Medications: Prior to Admission medications   Medication Sig Start Date End Date Taking? Authorizing Provider  atorvastatin (LIPITOR) 40 MG tablet Take 1 tablet by mouth daily. 10/18/20  Yes [provider]  carvedilol (COREG) 6.25 MG tablet Take 6.25 mg by mouth 2 (two) times daily. 10/18/20  Yes [provider]    Review of Systems:  Pertinent items noted in HPI and remainder of comprehensive ROS otherwise negative.  Physical Exam: Vitals:   11/20/20 2330 11/20/20 2352 11/21/20 0200 11/21/20 0325  BP: (!) 148/110 (!) 148/110 (!) 158/115 (!) 151/117  Pulse: 85 86 85 91  Resp: 20 18 11    Temp:  98.3 F (36.8 C) 98.3 F (36.8 C) 97.7 F (36.5 C)  TempSrc:  Oral Oral Oral  SpO2: 100% 98% 99% 100%  Weight:      Height:       General appearance: alert, cooperative and no distress Head: Normocephalic, without obvious abnormality, atraumatic Eyes: EOMI Lungs: clear to auscultation bilaterally Heart:  regular rate and rhythm and S1, S2 normal Abdomen: mild distension, no TTP, BS present Extremities: no edema Skin: mobility and turgor normal Neurologic: Grossly normal  Labs on Admission:  I have personally reviewed following labs and imaging studies Results for orders placed or performed during the hospital encounter of 11/20/20 (from the past 24 hour(s))  Comprehensive metabolic panel     Status: Abnormal   Collection Time: 11/20/20  2:16 PM  Result Value Ref Range   Sodium 143 135 - 145 mmol/L   Potassium 3.9 3.5 - 5.1 mmol/L   Chloride 112 (H) 98 - 111 mmol/L   CO2 24 22 - 32 mmol/L   Glucose, Bld 150 (H) 70 - 99 mg/dL   BUN 19 8 - 23 mg/dL   Creatinine, Ser 11/22/20 (H) 0.61 - 1.24 mg/dL   Calcium 8.6 (L) 8.9 - 10.3 mg/dL   Total Protein 7.4 6.5 - 8.1 g/dL   Albumin 3.2 (L) 3.5 - 5.0 g/dL   AST 28 15 - 41 U/L   ALT 18 0 - 44 U/L   Alkaline Phosphatase 49 38 -  126 U/L   Total Bilirubin 1.2 0.3 - 1.2 mg/dL   GFR, Estimated 51 (L) >60 mL/min   Anion gap 7 5 - 15  CBC with Differential     Status: Abnormal   Collection Time: 11/20/20  2:16 PM  Result Value Ref Range   WBC 4.2 4.0 - 10.5 K/uL   RBC 3.52 (L) 4.22 - 5.81 MIL/uL   Hemoglobin 10.2 (L) 13.0 - 17.0 g/dL   HCT 62.8 (L) 31.5 - 17.6 %   MCV 92.9 80.0 - 100.0 fL   MCH 29.0 26.0 - 34.0 pg   MCHC 31.2 30.0 - 36.0 g/dL   RDW 16.0 73.7 - 10.6 %   Platelets 96 (L) 150 - 400 K/uL   nRBC 0.0 0.0 - 0.2 %   Neutrophils Relative % 60 %   Neutro Abs 2.5 1.7 - 7.7 K/uL   Lymphocytes Relative 27 %   Lymphs Abs 1.1 0.7 - 4.0 K/uL   Monocytes Relative 11 %   Monocytes Absolute 0.5 0.1 - 1.0 K/uL   Eosinophils Relative 1 %   Eosinophils Absolute 0.0 0.0 - 0.5 K/uL   Basophils Relative 1 %   Basophils Absolute 0.0 0.0 - 0.1 K/uL   WBC Morphology TOXIC GRANULATION    Immature Granulocytes 0 %   Abs Immature Granulocytes 0.01 0.00 - 0.07 K/uL   Polychromasia PRESENT    Basophilic Stippling PRESENT   Brain natriuretic peptide      Status: Abnormal   Collection Time: 11/20/20  2:16 PM  Result Value Ref Range   B Natriuretic Peptide >4,500.0 (H) 0.0 - 100.0 pg/mL  Troponin I (High Sensitivity)     Status: Abnormal   Collection Time: 11/20/20  2:16 PM  Result Value Ref Range   Troponin I (High Sensitivity) 47 (H) <18 ng/L  Urinalysis, Routine w reflex microscopic Urine, Clean Catch     Status: Abnormal   Collection Time: 11/20/20  4:17 PM  Result Value Ref Range   Color, Urine AMBER (A) YELLOW   APPearance CLOUDY (A) CLEAR   Specific Gravity, Urine 1.016 1.005 - 1.030   pH 6.0 5.0 - 8.0   Glucose, UA NEGATIVE NEGATIVE mg/dL   Hgb urine dipstick LARGE (A) NEGATIVE   Bilirubin Urine NEGATIVE NEGATIVE   Ketones, ur NEGATIVE NEGATIVE mg/dL   Protein, ur 269 (A) NEGATIVE mg/dL   Nitrite NEGATIVE NEGATIVE   Leukocytes,Ua MODERATE (A) NEGATIVE   RBC / HPF >50 (H) 0 - 5 RBC/hpf   WBC, UA >50 (H) 0 - 5 WBC/hpf   Bacteria, UA RARE (A) NONE SEEN   WBC Clumps PRESENT    Budding Yeast PRESENT    Ca Oxalate Crys, UA PRESENT   Troponin I (High Sensitivity)     Status: Abnormal   Collection Time: 11/20/20  5:18 PM  Result Value Ref Range   Troponin I (High Sensitivity) 54 (H) <18 ng/L  Resp Panel by RT-PCR (Flu A&B, Covid) Nasopharyngeal Swab     Status: None   Collection Time: 11/20/20  6:52 PM   Specimen: Nasopharyngeal Swab; Nasopharyngeal(NP) swabs in vial transport medium  Result Value Ref Range   SARS Coronavirus 2 by RT PCR NEGATIVE NEGATIVE   Influenza A by PCR NEGATIVE NEGATIVE   Influenza B by PCR NEGATIVE NEGATIVE  Ferritin     Status: None   Collection Time: 11/20/20  9:30 PM  Result Value Ref Range   Ferritin 120 24 - 336 ng/mL  Folate  Status: None   Collection Time: 11/20/20  9:30 PM  Result Value Ref Range   Folate 8.7 >5.9 ng/mL  Iron and TIBC     Status: Abnormal   Collection Time: 11/20/20  9:30 PM  Result Value Ref Range   Iron 49 45 - 182 ug/dL   TIBC 161395 096250 - 045450 ug/dL   Saturation  Ratios 12 (L) 17.9 - 39.5 %   UIBC 346 ug/dL  Vitamin [id number]     Status: None   Collection Time: 11/20/20  9:30 PM  Result Value Ref Range   Vitamin B-12 422 180 - 914 pg/mL     Radiological Exams on Admission: DG Chest Portable 1 View  Result Date: 11/20/2020 CLINICAL DATA:  Shortness of breath. EXAM: PORTABLE CHEST 1 VIEW COMPARISON:  None. FINDINGS: Mild cardiomegaly. Normal aortic contours. There is mild bilateral hilar prominence. Mild peribronchial thickening. Subsegmental atelectasis in the left mid lung. No significant pleural effusion. No pneumothorax. No acute osseous abnormalities are seen. IMPRESSION: 1. Mild cardiomegaly. Mild peribronchial thickening of unknown chronicity. 2. Mild bilateral hilar prominence, likely prominent pulmonary arteries. Electronically Signed   By: Narda RutherfordMelanie  Sanford M.D.   On: 11/20/2020 15:13   DG Chest Portable 1 View  Final Result    US RENAL    (Results Pending)    Consults called:  Needs cardiology consult in am  EKG: Independently reviewed. NSR, LVH, QTc 491   Lewie Chamberavid Dewon Mendizabal, MD [facility] 11/21/2020, 5:16 AM

## 2020-11-20 NOTE — ED Triage Notes (Signed)
Pt states SHOB and hematuria since 7p last night. Pt states he was seen in a [place] [facility] in march for NSTEMI pt states was on lasix prior and stopped it.

## 2020-11-21 ENCOUNTER — Inpatient Hospital Stay (HOSPITAL_COMMUNITY): Payer: Medicare HMO

## 2020-11-21 ENCOUNTER — Other Ambulatory Visit: Payer: Self-pay

## 2020-11-21 DIAGNOSIS — E43 Unspecified severe protein-calorie malnutrition: Secondary | ICD-10-CM | POA: Insufficient documentation

## 2020-11-21 DIAGNOSIS — I5021 Acute systolic (congestive) heart failure: Secondary | ICD-10-CM

## 2020-11-21 DIAGNOSIS — R8281 Pyuria: Secondary | ICD-10-CM

## 2020-11-21 DIAGNOSIS — I5043 Acute on chronic combined systolic (congestive) and diastolic (congestive) heart failure: Secondary | ICD-10-CM

## 2020-11-21 DIAGNOSIS — N184 Chronic kidney disease, stage 4 (severe): Secondary | ICD-10-CM

## 2020-11-21 DIAGNOSIS — N1832 Chronic kidney disease, stage 3b: Secondary | ICD-10-CM

## 2020-11-21 DIAGNOSIS — I2510 Atherosclerotic heart disease of native coronary artery without angina pectoris: Secondary | ICD-10-CM

## 2020-11-21 DIAGNOSIS — N183 Chronic kidney disease, stage 3 unspecified: Secondary | ICD-10-CM

## 2020-11-21 DIAGNOSIS — N200 Calculus of kidney: Secondary | ICD-10-CM

## 2020-11-21 DIAGNOSIS — I248 Other forms of acute ischemic heart disease: Secondary | ICD-10-CM

## 2020-11-21 LAB — ECHOCARDIOGRAM COMPLETE
Area-P 1/2: 5.36 cm2
Calc EF: 29.4 %
Height: 75 in
S' Lateral: 5.63 cm
Single Plane A2C EF: 25.3 %
Single Plane A4C EF: 34.1 %
Weight: 2543.23 oz

## 2020-11-21 LAB — BASIC METABOLIC PANEL
Anion gap: 11 (ref 5–15)
BUN: 21 mg/dL (ref 8–23)
CO2: 23 mmol/L (ref 22–32)
Calcium: 9 mg/dL (ref 8.9–10.3)
Chloride: 105 mmol/L (ref 98–111)
Creatinine, Ser: 1.66 mg/dL — ABNORMAL HIGH (ref 0.61–1.24)
GFR, Estimated: 44 mL/min — ABNORMAL LOW (ref >60)
Glucose, Bld: 105 mg/dL — ABNORMAL HIGH (ref 70–99)
Potassium: 3.8 mmol/L (ref 3.5–5.1)
Sodium: 139 mmol/L (ref 135–145)

## 2020-11-21 LAB — CBC
HCT: 34.5 % — ABNORMAL LOW (ref 39.0–52.0)
Hemoglobin: 10.8 g/dL — ABNORMAL LOW (ref 13.0–17.0)
MCH: 29.1 pg (ref 26.0–34.0)
MCHC: 31.3 g/dL (ref 30.0–36.0)
MCV: 93 fL (ref 80.0–100.0)
Platelets: 92 10*3/uL — ABNORMAL LOW (ref 150–400)
RBC: 3.71 MIL/uL — ABNORMAL LOW (ref 4.22–5.81)
RDW: 15.4 % (ref 11.5–15.5)
WBC: 4.8 10*3/uL (ref 4.0–10.5)
nRBC: 0 % (ref 0.0–0.2)

## 2020-11-21 LAB — IRON AND TIBC
Iron: 49 ug/dL (ref 45–182)
Saturation Ratios: 12 % — ABNORMAL LOW (ref 17.9–39.5)
TIBC: 395 ug/dL (ref 250–450)
UIBC: 346 ug/dL

## 2020-11-21 LAB — VITAMIN B12: Vitamin B-12: 422 pg/mL (ref 180–914)

## 2020-11-21 LAB — FERRITIN: Ferritin: 120 ng/mL (ref 24–336)

## 2020-11-21 LAB — FOLATE: Folate: 8.7 ng/mL (ref >5.9)

## 2020-11-21 LAB — HIV ANTIBODY (ROUTINE TESTING W REFLEX): HIV Screen 4th Generation wRfx: NONREACTIVE

## 2020-11-21 MED ORDER — ACETAMINOPHEN 325 MG PO TABS
650.0000 mg | ORAL_TABLET | ORAL | Status: DC | PRN
  Administered 2020-11-21: 650 mg via ORAL
  Filled 2020-11-21: qty 2

## 2020-11-21 MED ORDER — ENOXAPARIN SODIUM 40 MG/0.4ML ~~LOC~~ SOLN
40.0000 mg | SUBCUTANEOUS | Status: DC
  Administered 2020-11-21 – 2020-11-24 (×4): 40 mg via SUBCUTANEOUS
  Filled 2020-11-21 (×3): qty 0.4

## 2020-11-21 MED ORDER — FUROSEMIDE 10 MG/ML IJ SOLN
40.0000 mg | Freq: Every day | INTRAMUSCULAR | Status: DC
  Administered 2020-11-21: 40 mg via INTRAVENOUS
  Filled 2020-11-21: qty 4

## 2020-11-21 MED ORDER — SODIUM CHLORIDE 0.9 % IV SOLN: 250.0000 mL | INTRAVENOUS | Status: DC | PRN

## 2020-11-21 MED ORDER — ASPIRIN EC 81 MG PO TBEC
81.0000 mg | DELAYED_RELEASE_TABLET | Freq: Every day | ORAL | Status: DC
  Administered 2020-11-21 – 2020-11-24 (×4): 81 mg via ORAL
  Filled 2020-11-21 (×4): qty 1

## 2020-11-21 MED ORDER — FUROSEMIDE 10 MG/ML IJ SOLN
80.0000 mg | Freq: Two times a day (BID) | INTRAMUSCULAR | Status: DC
  Administered 2020-11-21 – 2020-11-23 (×4): 80 mg via INTRAVENOUS
  Filled 2020-11-21 (×4): qty 8

## 2020-11-21 MED ORDER — ATORVASTATIN CALCIUM 40 MG PO TABS
40.0000 mg | ORAL_TABLET | Freq: Every day | ORAL | Status: DC
  Administered 2020-11-21 – 2020-11-24 (×4): 40 mg via ORAL
  Filled 2020-11-21 (×4): qty 1

## 2020-11-21 MED ORDER — ADULT MULTIVITAMIN W/MINERALS CH
1.0000 | ORAL_TABLET | Freq: Every day | ORAL | Status: DC
  Administered 2020-11-21 – 2020-11-24 (×4): 1 via ORAL
  Filled 2020-11-21 (×4): qty 1

## 2020-11-21 MED ORDER — SODIUM CHLORIDE 0.9% FLUSH
3.0000 mL | Freq: Two times a day (BID) | INTRAVENOUS | Status: DC
  Administered 2020-11-21 – 2020-11-24 (×8): 3 mL via INTRAVENOUS

## 2020-11-21 MED ORDER — ONDANSETRON HCL 4 MG/2ML IJ SOLN: 4.0000 mg | Freq: Four times a day (QID) | INTRAMUSCULAR | Status: DC | PRN

## 2020-11-21 MED ORDER — BOOST PLUS PO LIQD
237.0000 mL | Freq: Three times a day (TID) | ORAL | Status: DC
  Administered 2020-11-21 – 2020-11-23 (×6): 237 mL via ORAL
  Filled 2020-11-21 (×10): qty 237

## 2020-11-21 MED ORDER — SODIUM CHLORIDE 0.9% FLUSH: 3.0000 mL | INTRAVENOUS | Status: DC | PRN

## 2020-11-21 MED ORDER — CARVEDILOL 3.125 MG PO TABS
6.2500 mg | ORAL_TABLET | Freq: Two times a day (BID) | ORAL | Status: DC
  Administered 2020-11-21 – 2020-11-24 (×8): 6.25 mg via ORAL
  Filled 2020-11-21 (×8): qty 2

## 2020-11-21 NOTE — Progress Notes (Signed)
Initial Nutrition Assessment  DOCUMENTATION CODES:  Severe malnutrition in context of chronic illness  INTERVENTION:  Downgraded diet to Dys 2 given poor dentition.  Add Boost Plus po TID, each supplement provides 360 kcal and 14 grams of protein.  Add Magic cup TID with meals, each supplement provides 290 kcal and 9 grams of protein.  Add MVI with minerals daily.  NUTRITION DIAGNOSIS:  Severe Malnutrition related to chronic illness as evidenced by energy intake < 75% for > or equal to 1 month,moderate fat depletion,severe fat depletion,moderate muscle depletion,severe muscle depletion.  GOAL:  Patient will meet greater than or equal to 90% of their needs  MONITOR:  PO intake,Supplement acceptance,Diet advancement,Labs,I & O's,Weight trends  REASON FOR ASSESSMENT:  Malnutrition Screening Tool    ASSESSMENT:  70 yo male with a PMH of systolic/diastolic CHF, CKD stage 3a, CAD, nephrolithiasis, and HTN who presents with acute on chronic combined systolic and diastolic CHF.  Spoke with pt at bedside. Pt in good spirits. He reports that his appetite has been low for the last 3-4 weeks. He reports that his condition where his kidneys and intestines swell, causing them to press on his stomach, causes decreased appetite. His last hospital stay was around this time, and he lost 3 lbs during that course. He reports losing 6-10 more pounds before admission. Pt reports drinking vanilla Boost the last week that his brother brought over.  He also reports that he has very poor dentition, and it is difficult for him to chew thoroughly; it used to exhaust him. He reports wanting to see a specialist for dentures.  It is difficult to determine true weight loss given that pt has fluid on him with his conditions, however, he is considered underweight for his height.  On exam, pt has severe depletions in both fat and muscle, indicating severe malnutrition. It is likely chronic given his conditions.  Fluid may be masking some losses. Pt also had very clubbed fingertips, likely from CHF.  Recommend adding MVI with minerals, Boost Plus TID, and Magic Cup TID to promote intake. Also, given poor dentition, RD to downgrade diet to Dys 2 so that pt may have an easier time eating.  Medications: Lasix Labs: reviewed; Glucose 150  NUTRITION - FOCUSED PHYSICAL EXAM: Flowsheet Row Most Recent Value  Orbital Region Severe depletion  Upper Arm Region Severe depletion  Thoracic and Lumbar Region Moderate depletion  Buccal Region Moderate depletion  Temple Region Severe depletion  Clavicle Bone Region Moderate depletion  Clavicle and Acromion Bone Region Severe depletion  Scapular Bone Region Unable to assess  Dorsal Hand Moderate depletion  Patellar Region Moderate depletion  Anterior Thigh Region Moderate depletion  Posterior Calf Region Severe depletion  Edema (RD Assessment) None  Hair Reviewed  Eyes Reviewed  Mouth Reviewed  Skin Reviewed  Nails Reviewed     Diet Order:   Diet Order            DIET DYS 2 Room service appropriate? Yes; Fluid consistency: Thin  Diet effective now                EDUCATION NEEDS:  Education needs have been addressed  Skin:  Skin Assessment: Reviewed RN Assessment  Last BM:  11/20/20  Height:  Ht Readings from Last 1 Encounters:  11/21/20 6\' 3"  (1.905 m)   Weight:  Wt Readings from Last 1 Encounters:  11/21/20 72.1 kg   Ideal Body Weight:  89.1 kg  BMI:  Body mass index is  19.87 kg/m.  Estimated Nutritional Needs:  Kcal:  2100-2300 Protein:  85-100 grams Fluid:  2 L/day  Vertell Limber, RD, LDN Registered Dietitian After Hours/Weekend Pager # in [place]

## 2020-11-21 NOTE — Progress Notes (Signed)
  Echocardiogram 2D Echocardiogram has been performed.  Augustine Radar 11/21/2020, 12:08 PM

## 2020-11-21 NOTE — Consult Note (Addendum)
CONSULTATION NOTE   Patient Name: Joseph Tanner Date of Encounter: 11/21/2020 Cardiologist: No primary care provider on file. Electrophysiologist: None Advanced Heart Failure: None   Chief Complaint   Shortness of breath, new heart failure  Patient Profile   70 yo male with history of CAD/NSTEMI, combined systolic and diastolic HF, HTN, HLD, presents with worsening dyspnea.  HPI   Joseph Tanner is a 70 y.o. male who is being seen today for the evaluation of CHF at the request of Dr. Alvino Chapel. This is a pleasant 70 year old male with a history of coronary artery disease and prior NSTEMI, chronic combined systolic and diastolic heart failure with recent LVEF less than 25% and severe global hypokinesis with moderate RV dysfunction, hypertension, dyslipidemia and a presentation of worsening dyspnea.  He recently moved from [place] to [place] about a week ago.  He said he was hospitalized in March for cardiomyopathy as well as nephrocalcinosis and UTI.  Left heart catheterization was performed there which showed severe stenosis of a small circumflex artery and mild to moderate stenosis of a large right coronary artery which was heavily calcified.  The LV dysfunction was felt to be out of proportion for his coronary disease and no intervention was performed.  He now presents with worsening orthopnea, dyspnea on exertion and abdominal distention.  He also reports significant social drinking.  Initial work-up demonstrated a high BNP greater than 4500, flat mildly elevated troponin up to 54, creatinine 1.48 with sodium 143.  Chest xray showed mild cardiomegaly and mild bilateral hilar prominence. He has so far recorded net negative 412 cc.  PMHx   Past Medical History:  Diagnosis Date  . Hyperlipemia   . Hypertension   . NSTEMI (non-ST elevated myocardial infarction) [facility])     History reviewed. No pertinent surgical history.  FAMHx   History reviewed. No  pertinent family history.  SOCHx    reports that he has been smoking. He has never used smokeless tobacco. He reports current alcohol use. He reports that he does not use drugs.  Outpatient Medications   No current facility-administered medications on file prior to encounter.   Current Outpatient Medications on File Prior to Encounter  Medication Sig Dispense Refill  . atorvastatin (LIPITOR) 40 MG tablet Take 1 tablet by mouth daily.    . carvedilol (COREG) 6.25 MG tablet Take 6.25 mg by mouth 2 (two) times daily.      Inpatient Medications    Scheduled Meds: . aspirin EC  81 mg Oral Daily  . atorvastatin  40 mg Oral Daily  . carvedilol  6.25 mg Oral BID WC  . enoxaparin (LOVENOX) injection  40 mg Subcutaneous Q24H  . furosemide  40 mg Intravenous Daily  . lactose free nutrition  237 mL Oral TID WC  . multivitamin with minerals  1 tablet Oral Daily  . sodium chloride flush  3 mL Intravenous Q12H    Continuous Infusions: . sodium chloride      PRN Meds: sodium chloride, acetaminophen, hydrALAZINE, labetalol, ondansetron (ZOFRAN) IV, sodium chloride flush   ALLERGIES   No Known Allergies  ROS   Pertinent items noted in HPI and remainder of comprehensive ROS otherwise negative.  Vitals   Vitals:   11/21/20 0325 11/21/20 0630 11/21/20 0916 11/21/20 1210  BP: (!) 151/117  127/81 (!) 127/91  Pulse: 91  77 76  Resp:   18 20  Temp: 97.7 F (36.5 C)  97.8 F (36.6 C) 97.9 F (36.6 C)  TempSrc: Oral  Oral Oral  SpO2: 100%  100% 100%  Weight:  72.1 kg    Height:   (1.905 m)      Intake/Output Summary (Last 24 hours) at 11/21/2020 1430 Last data filed at 11/21/2020 1217 Gross per 24 hour  Intake 463 ml  Output 875 ml  Net -412 ml   Filed Weights   11/20/20 1349 11/21/20 0630  Weight: 79.4 kg 72.1 kg    Physical Exam   General appearance: alert, no distress and thin, poor dentition - nearly all teeth pulled, wearing slippers with beer mugs as  decoration Neck: JVD - several cm above sternal notch, no carotid bruit and thyroid not enlarged, symmetric, no tenderness/mass/nodules Lungs: clear to auscultation bilaterally Heart: regular rate and rhythm, S1, S2 normal and S3 present Abdomen: soft, non-tender; bowel sounds normal; no masses,  no organomegaly and scaphoid Extremities: extremities normal, atraumatic, no cyanosis or edema Pulses: 2+ and symmetric Skin: Skin color, texture, turgor normal. No rashes or lesions Neurologic: Grossly normal Psych: Pleasant, affable  Labs   Results for orders placed or performed during the hospital encounter of 11/20/20 (from the past 48 hour(s))  Comprehensive metabolic panel     Status: Abnormal   Collection Time: 11/20/20  2:16 PM  Result Value Ref Range   Sodium 143 135 - 145 mmol/L   Potassium 3.9 3.5 - 5.1 mmol/L   Chloride 112 (H) 98 - 111 mmol/L   CO2 24 22 - 32 mmol/L   Glucose, Bld 150 (H) 70 - 99 mg/dL    Comment: Glucose reference range applies only to samples taken after fasting for at least 8 hours.   BUN 19 8 - 23 mg/dL   Creatinine, Ser 4.09 (H) 0.61 - 1.24 mg/dL   Calcium 8.6 (L) 8.9 - 10.3 mg/dL   Total Protein 7.4 6.5 - 8.1 g/dL   Albumin 3.2 (L) 3.5 - 5.0 g/dL   AST 28 15 - 41 U/L   ALT 18 0 - 44 U/L   Alkaline Phosphatase 49 38 - 126 U/L   Total Bilirubin 1.2 0.3 - 1.2 mg/dL   GFR, Estimated 51 (L) >60 mL/min    Comment: (NOTE) Calculated using the CKD-EPI Creatinine Equation (2021)    Anion gap 7 5 - 15    Comment: Performed at [facility], [address] [place], [place] [address]  CBC with Differential     Status: Abnormal   Collection Time: 11/20/20  2:16 PM  Result Value Ref Range   WBC 4.2 4.0 - 10.5 K/uL    Comment: WHITE COUNT CONFIRMED ON SMEAR   RBC 3.52 (L) 4.22 - 5.81 MIL/uL   Hemoglobin 10.2 (L) 13.0 - 17.0 g/dL   HCT 47.8 (L) 29.5 - 62.1 %   MCV 92.9 80.0 - 100.0 fL   MCH 29.0 26.0 - 34.0 pg   MCHC 31.2 30.0 - 36.0 g/dL    RDW 30.8 65.7 - 84.6 %   Platelets 96 (L) 150 - 400 K/uL    Comment: SPECIMEN CHECKED FOR CLOTS Immature Platelet Fraction may be clinically indicated, consider ordering this additional test [id number] REPEATED TO VERIFY PLATELET COUNT CONFIRMED BY SMEAR    nRBC 0.0 0.0 - 0.2 %   Neutrophils Relative % 60 %   Neutro Abs 2.5 1.7 - 7.7 K/uL   Lymphocytes Relative 27 %   Lymphs Abs 1.1 0.7 - 4.0 K/uL   Monocytes Relative 11 %   Monocytes Absolute 0.5  0.1 - 1.0 K/uL   Eosinophils Relative 1 %   Eosinophils Absolute 0.0 0.0 - 0.5 K/uL   Basophils Relative 1 %   Basophils Absolute 0.0 0.0 - 0.1 K/uL   WBC Morphology TOXIC GRANULATION    Immature Granulocytes 0 %   Abs Immature Granulocytes 0.01 0.00 - 0.07 K/uL   Polychromasia PRESENT    Basophilic Stippling PRESENT     Comment: Performed at [facility], [address], [place], [place] [address]  Brain natriuretic peptide     Status: Abnormal   Collection Time: 11/20/20  2:16 PM  Result Value Ref Range   B Natriuretic Peptide >4,500.0 (H) 0.0 - 100.0 pg/mL    Comment: Performed at [facility], [address], [place], [place] [address]  Troponin I (High Sensitivity)     Status: Abnormal   Collection Time: 11/20/20  2:16 PM  Result Value Ref Range   Troponin I (High Sensitivity) 47 (H) <18 ng/L    Comment: (NOTE) Elevated high sensitivity troponin I (hsTnI) values and significant  changes across serial measurements may suggest ACS but many other  chronic and acute conditions are known to elevate hsTnI results.  Refer to the "Links" section for chest pain algorithms and additional  guidance. Performed at [facility], [address] [place], [place] [address]   Urinalysis, Routine w reflex microscopic Urine, Clean Catch     Status: Abnormal   Collection Time: 11/20/20  4:17 PM  Result Value Ref Range   Color, Urine AMBER (A) YELLOW    Comment: BIOCHEMICALS MAY BE  AFFECTED BY COLOR   APPearance CLOUDY (A) CLEAR   Specific Gravity, Urine 1.016 1.005 - 1.030   pH 6.0 5.0 - 8.0   Glucose, UA NEGATIVE NEGATIVE mg/dL   Hgb urine dipstick LARGE (A) NEGATIVE   Bilirubin Urine NEGATIVE NEGATIVE   Ketones, ur NEGATIVE NEGATIVE mg/dL   Protein, ur 453 (A) NEGATIVE mg/dL   Nitrite NEGATIVE NEGATIVE   Leukocytes,Ua MODERATE (A) NEGATIVE   RBC / HPF >50 (H) 0 - 5 RBC/hpf   WBC, UA >50 (H) 0 - 5 WBC/hpf   Bacteria, UA RARE (A) NONE SEEN   WBC Clumps PRESENT    Budding Yeast PRESENT    Ca Oxalate Crys, UA PRESENT     Comment: Performed at [facility], [address], [place], [place] [address]  Troponin I (High Sensitivity)     Status: Abnormal   Collection Time: 11/20/20  5:18 PM  Result Value Ref Range   Troponin I (High Sensitivity) 54 (H) <18 ng/L    Comment: (NOTE) Elevated high sensitivity troponin I (hsTnI) values and significant  changes across serial measurements may suggest ACS but many other  chronic and acute conditions are known to elevate hsTnI results.  Refer to the "Links" section for chest pain algorithms and additional  guidance. Performed at [facility], [address] [place], [place] [address]   Resp Panel by RT-PCR (Flu A&B, Covid) Nasopharyngeal Swab     Status: None   Collection Time: 11/20/20  6:52 PM   Specimen: Nasopharyngeal Swab; Nasopharyngeal(NP) swabs in vial transport medium  Result Value Ref Range   SARS Coronavirus 2 by RT PCR NEGATIVE NEGATIVE    Comment: (NOTE) SARS-CoV-2 target nucleic acids are NOT DETECTED.  The SARS-CoV-2 RNA is generally detectable in upper respiratory specimens during the acute phase of infection. The lowest concentration of SARS-CoV-2 viral copies this  assay can detect is 138 copies/mL. A negative result does not preclude SARS-Cov-2 infection and should not be used as the sole basis for treatment or other patient management decisions. A negative  result may occur with  improper specimen collection/handling, submission of specimen other than nasopharyngeal swab, presence of viral mutation(s) within the areas targeted by this assay, and inadequate number of viral copies(<138 copies/mL). A negative result must be combined with clinical observations, patient history, and epidemiological information. The expected result is Negative.  Fact Sheet for Patients:  BloggerCourse.com  Fact Sheet for Healthcare Providers:  SeriousBroker.it  This test is no t yet approved or cleared by the [place] FDA and  has been authorized for detection and/or diagnosis of SARS-CoV-2 by FDA under an Emergency Use Authorization (EUA). This EUA will remain  in effect (meaning this test can be used) for the duration of the COVID-19 declaration under Section 564(b)(1) of the Act, 21 U.S.C.section 360bbb-3(b)(1), unless the authorization is terminated  or revoked sooner.       Influenza A by PCR NEGATIVE NEGATIVE   Influenza B by PCR NEGATIVE NEGATIVE    Comment: (NOTE) The Xpert Xpress SARS-CoV-2/FLU/RSV plus assay is intended as an aid in the diagnosis of influenza from Nasopharyngeal swab specimens and should not be used as a sole basis for treatment. Nasal washings and aspirates are unacceptable for Xpert Xpress SARS-CoV-2/FLU/RSV testing.  Fact Sheet for Patients: BloggerCourse.com  Fact Sheet for Healthcare Providers: SeriousBroker.it  This test is not yet approved or cleared by the [place] FDA and has been authorized for detection and/or diagnosis of SARS-CoV-2 by FDA under an Emergency Use Authorization (EUA). This EUA will remain in effect (meaning this test can be used) for the duration of the COVID-19 declaration under Section 564(b)(1) of the Act, 21 U.S.C. section 360bbb-3(b)(1), unless the authorization is terminated  or revoked.  Performed at [facility], [name], [place], [place] [address]   Ferritin     Status: None   Collection Time: 11/20/20  9:30 PM  Result Value Ref Range   Ferritin 120 24 - 336 ng/mL    Comment: Performed at [facility], [address] [place], [place] [address]  Folate     Status: None   Collection Time: 11/20/20  9:30 PM  Result Value Ref Range   Folate 8.7 >5.9 ng/mL    Comment: Performed at [facility], [address], [place], [place] [address]  Iron and TIBC     Status: Abnormal   Collection Time: 11/20/20  9:30 PM  Result Value Ref Range   Iron 49 45 - 182 ug/dL   TIBC 488 891 - 694 ug/dL   Saturation Ratios 12 (L) 17.9 - 39.5 %   UIBC 346 ug/dL    Comment: Performed at [facility], [address] [place], [place] [address]  Vitamin B12     Status: None   Collection Time: 11/20/20  9:30 PM  Result Value Ref Range   Vitamin B-12 422 180 - 914 pg/mL    Comment: (NOTE) This assay is not validated for testing neonatal or myeloproliferative syndrome specimens for Vitamin B12 levels. Performed at [facility], [address], [place], [place] [address]   HIV Antibody (routine testing w rflx)     Status: None   Collection Time: 11/21/20  3:48 AM  Result Value Ref Range   HIV Screen 4th Generation wRfx Non Reactive Non Reactive  Comment: Performed at [facility], [address] [place], [place] [address]  CBC     Status: Abnormal   Collection Time: 11/21/20  8:37 AM  Result Value Ref Range   WBC 4.8 4.0 - 10.5 K/uL   RBC 3.71 (L) 4.22 - 5.81 MIL/uL   Hemoglobin 10.8 (L) 13.0 - 17.0 g/dL   HCT 81.134.5 (L) 91.439.0 - 78.252.0 %   MCV 93.0 80.0 - 100.0 fL   MCH 29.1 26.0 - 34.0 pg   MCHC 31.3 30.0 - 36.0 g/dL   RDW 95.615.4 21.311.5 - 08.615.5 %   Platelets 92 (L) 150 - 400 K/uL    Comment: Immature Platelet Fraction may be clinically indicated, consider ordering  this additional test [id number]    nRBC 0.0 0.0 - 0.2 %    Comment: Performed at [facility], [address] [place], [place] [address]  Basic metabolic panel     Status: Abnormal   Collection Time: 11/21/20  8:37 AM  Result Value Ref Range   Sodium 139 135 - 145 mmol/L   Potassium 3.8 3.5 - 5.1 mmol/L   Chloride 105 98 - 111 mmol/L   CO2 23 22 - 32 mmol/L   Glucose, Bld 105 (H) 70 - 99 mg/dL    Comment: Glucose reference range applies only to samples taken after fasting for at least 8 hours.   BUN 21 8 - 23 mg/dL   Creatinine, Ser 1.321.66 (H) 0.61 - 1.24 mg/dL   Calcium 9.0 8.9 - 44.010.3 mg/dL   GFR, Estimated 44 (L) >60 mL/min    Comment: (NOTE) Calculated using the CKD-EPI Creatinine Equation (2021)    Anion gap 11 5 - 15    Comment: Performed at [facility], [address] [place], [place] [address]    ECG   ER ekg shows sinus rhythm with PAC's, LVH - Personally Reviewed  Telemetry   Sinus rhythm with PAC's- Personally Reviewed  Radiology   US RENAL  Result Date: 11/21/2020 CLINICAL DATA:  Nephrolithiasis. EXAM: RENAL / URINARY TRACT ULTRASOUND COMPLETE COMPARISON:  None. FINDINGS: Right Kidney: Renal measurements: 10.7 x 4.4 x 4.8 cm = volume: 116.8 mL. Echogenicity within normal limits. 0.7 cm simple cyst. No hydronephrosis visualized. Left Kidney: Renal measurements: 10.3 x 4.8 x 5.6 cm = volume: 146.2 mL. Echogenicity within normal limits. No mass or hydronephrosis visualized. 1.3 cm nonobstructing calyceal stone. Bladder: Bladder is nondistended. Mild thickening of the bladder wall, possibly related to hypertrophy secondary to prostate enlargement. Bladder pathology cannot be excluded. Other: Prostate is enlarged at 4.4 x 3.6 x 5.9 cm. IMPRESSION: 1.  0.7 cm right renal simple cyst. 2.  1.3 cm nonobstructing left renal calyceal stone. 3. Prostate is enlarged. Mild thickening of the bladder wall, possibly related upper tree secondary to  prostate enlargement. Bladder wall pathology cannot be excluded. Electronically Signed   By: Maisie Fushomas  Register   On: 11/21/2020 06:28   DG Chest Portable 1 View  Result Date: 11/20/2020 CLINICAL DATA:  Shortness of breath. EXAM: PORTABLE CHEST 1 VIEW COMPARISON:  None. FINDINGS: Mild cardiomegaly. Normal aortic contours. There is mild bilateral hilar prominence. Mild peribronchial thickening. Subsegmental atelectasis in the left mid lung. No significant pleural effusion. No pneumothorax. No acute osseous abnormalities are seen. IMPRESSION: 1. Mild cardiomegaly. Mild peribronchial thickening of unknown chronicity. 2. Mild bilateral hilar prominence, likely prominent pulmonary arteries. Electronically Signed   By: Narda RutherfordMelanie  Sanford M.D.   On: 11/20/2020 15:13  ECHOCARDIOGRAM COMPLETE  Result Date: 11/21/2020    ECHOCARDIOGRAM REPORT   Patient Name:   Joseph Tanner [facility] Date of Exam: 11/21/2020 Medical Rec #:  [id number]               Height:       75.0 in Accession #:    [id number]              Weight:       159.0 lb Date of Birth:  [birth year]                BSA:          1.990 m Patient Age:    69 years                BP:           151/117 mmHg Patient Gender: M                       HR:           78 bpm. Exam Location:  Inpatient Procedure: 2D Echo, Cardiac Doppler and Color Doppler Indications:    CHF-Acute Systolic I50.21  History:        Patient has no prior history of Echocardiogram examinations.                 Previous Myocardial Infarction; Risk Factors:Dyslipidemia and                 Hypertension.  Sonographer:    Eulah Pont RDCS Referring Phys: [phone] DAVID GIRGUIS IMPRESSIONS  1. Left ventricular ejection fraction, by estimation, is 20 to 25%. The left ventricle has severely decreased function. The left ventricle demonstrates global hypokinesis. The left ventricular internal cavity size was moderately dilated. Left ventricular diastolic parameters are consistent with Grade III diastolic  dysfunction (restrictive). Elevated left atrial pressure.  2. Right ventricular systolic function is mildly reduced. The right ventricular size is mildly enlarged. There is normal pulmonary artery systolic pressure.  3. Left atrial size was moderately dilated.  4. Right atrial size was severely dilated.  5. The mitral valve is normal in structure. Trivial mitral valve regurgitation.  6. The aortic valve is tricuspid. Aortic valve regurgitation is trivial. No aortic stenosis is present.  7. The inferior vena cava is dilated in size with <50% respiratory variability, suggesting right atrial pressure of 15 mmHg. FINDINGS  Left Ventricle: Left ventricular ejection fraction, by estimation, is 20 to 25%. The left ventricle has severely decreased function. The left ventricle demonstrates global hypokinesis. The left ventricular internal cavity size was moderately dilated. There is no left ventricular hypertrophy. Left ventricular diastolic parameters are consistent with Grade III diastolic dysfunction (restrictive). Elevated left atrial pressure. Right Ventricle: The right ventricular size is mildly enlarged. No increase in right ventricular wall thickness. Right ventricular systolic function is mildly reduced. There is normal pulmonary artery systolic pressure. The tricuspid regurgitant velocity  is 2.19 m/s, and with an assumed right atrial pressure of 15 mmHg, the estimated right ventricular systolic pressure is 34.2 mmHg. Left Atrium: Left atrial size was moderately dilated. Right Atrium: Right atrial size was severely dilated. Pericardium: There is no evidence of pericardial effusion. Mitral Valve: The mitral valve is normal in structure. Mild mitral annular calcification. Trivial mitral valve regurgitation. Tricuspid Valve: The tricuspid valve is normal in structure. Tricuspid valve regurgitation is trivial. Aortic Valve: The aortic valve is tricuspid. Aortic valve regurgitation is trivial. No aortic stenosis  is  present. Pulmonic Valve: The pulmonic valve was normal in structure. Pulmonic valve regurgitation is mild. Aorta: The aortic root is normal in size and structure. Venous: The inferior vena cava is dilated in size with less than 50% respiratory variability, suggesting right atrial pressure of 15 mmHg. IAS/Shunts: No atrial level shunt detected by color flow Doppler.  LEFT VENTRICLE PLAX 2D LVIDd:         6.40 cm      Diastology LVIDs:         5.63 cm      LV e' medial:    2.70 cm/s LV PW:         1.20 cm      LV E/e' medial:  36.5 LV IVS:        1.00 cm      LV e' lateral:   4.22 cm/s LVOT diam:     2.10 cm      LV E/e' lateral: 23.3 LV SV:         60 LV SV Index:   30 LVOT Area:     3.46 cm  LV Volumes (MOD) LV vol d, MOD A2C: 174.0 ml LV vol d, MOD A4C: 176.0 ml LV vol s, MOD A2C: 130.0 ml LV vol s, MOD A4C: 116.0 ml LV SV MOD A2C:     44.0 ml LV SV MOD A4C:     176.0 ml LV SV MOD BP:      53.5 ml RIGHT VENTRICLE TAPSE (M-mode): 1.6 cm LEFT ATRIUM             Index       RIGHT ATRIUM           Index LA diam:        4.70 cm 2.36 cm/m  RA Area:     21.70 cm LA Vol (A2C):   41.9 ml 21.05 ml/m RA Volume:   74.20 ml  37.28 ml/m LA Vol (A4C):   43.5 ml 21.86 ml/m LA Biplane Vol: 45.0 ml 22.61 ml/m  AORTIC VALVE LVOT Vmax:   105.60 cm/s LVOT Vmean:  68.500 cm/s LVOT VTI:    0.173 m  AORTA Ao Root diam: 3.50 cm Ao Asc diam:  3.70 cm MITRAL VALVE               TRICUSPID VALVE MV Area (PHT): 5.36 cm    TR Peak grad:   19.2 mmHg MV Decel Time: 142 msec    TR Vmax:        219.00 cm/s MV E velocity: 98.47 cm/s MV A velocity: 23.77 cm/s  SHUNTS MV E/A ratio:  4.14        Systemic VTI:  0.17 m                            Systemic Diam: 2.10 cm Rachelle Hora Croitoru MD Electronically signed by Thurmon Fair MD Signature Date/Time: 11/21/2020/1:09:52 PM    Final     Cardiac Studies   See echo above  Impression   1. Principal Problem: 2.   Acute on chronic combined systolic and diastolic CHF (congestive heart failure)  (HCC) 3. Active Problems: 4.   Demand ischemia (HCC) 5.   Coronary artery disease 6.   CKD (chronic kidney disease) stage 3, GFR 30-59 ml/min (HCC) 7.   Nephrolithiasis 8.   Pyuria 9.   Protein-calorie malnutrition, severe 10.   Recommendation   1. Acute on chronic  combined systolic and diastolic heart failure-Mr. Oldenkamp has a longstanding history of cardiomyopathy however LVEF has further decline.  He notes he has been more fatigued and more short of breath, consistent with NYHA Class IV symptoms.  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  He does report fairly heavy alcohol use but says that he has now been abstinent for the past several weeks after moving back to [place] to be closer to his grandkids.  He is retired from his career and trucking.  He has had very little urine output and has a very high BNP of 4500.  Echo shows grade 3 (restrictive) diastolic dysfunction suggesting high LVEDP.  He needs more aggressive diuresis.  Given elevated creatinine of 1.66 and GFR of 44, would recommend increasing his Lasix further to 80 mg IV twice daily.  We can continue his current dose of carvedilol and consider starting Entresto prior to discharge. 2. NICM -most likely etiology is alcohol use.  He has very poor dentition and evidence for severe protein calorie malnutrition.  He may have a degree of cardiac cachexia as well.  Although he was noted to have some coronary artery disease by recent cath in March it was felt to be out of proportion to his cardiomyopathy. 3. CAD -noted to have coronary artery disease out of proportion to his cardiomyopathy including severe stenosis of a small circumflex artery and mild to moderate stenosis and heavy calcification of the large right coronary artery.  Would recommend medical therapy for this.  Continue atorvastatin 40 mg daily and aspirin 81 mg daily.  He is also on carvedilol 6.25 mg twice daily. 4. CKD3b-  may be due to intrinsic renal disease and possibly a cardiorenal syndrome.  He is also had a recent issue with nephrolithiasis and urinary tract infection but completed antibiotics.  Thanks for the consultation.  Time Spent Directly with Patient:  I have spent a total of 65 minutes with the patient reviewing hospital notes, telemetry, EKGs, labs and examining the patient as well as establishing an assessment and plan that was discussed personally with the patient.  > 50% of time was spent in direct patient care.  Length of Stay:  LOS: 1 day   Chrystie Nose, MD, [facility], FACP  [place]  [facility]  Medical Director of the Advanced Lipid [facility] Diplomate of the American Board of Clinical Lipidology Attending Cardiologist  Direct Dial: [phone]  Fax: [phone]  Website:  [email or web address] Nicely Ezrie Bunyan 11/21/2020, 2:30 PM

## 2020-11-21 NOTE — Progress Notes (Signed)
PROGRESS NOTE    Joseph Tanner  VKP:[id number] DOB: 05-Apr-1951 DOA: 11/20/2020 PCP: Pcp, No     Brief Narrative:  Joseph Tanner is a 70 yo male with past medical history significant for chronic combined systolic and diastolic heart failure, coronary artery disease, nephrolithiasis and hypertension who presents to the hospital with worsening shortness of breath and increased swelling in his abdomen.  Patient apparently moved from [place] to [place] a week ago.  He was hospitalized in [place] from 10/14/20 to 10/18/20 for "cardiomyopathy" and "nephrocalcinosis with UTI". Work-up in [place] notable for echo revealing EF <25%, severe global hypokinesis, RV function mod/severely reduced. Grade 1 DD.   New events last 24 hours / Subjective: Today he states that he is feeling much better, denies any worsening shortness of breath, although feels some sort of a heaviness with taking in breaths.  Denies any chest pain.  Denies any orthopnea, peripheral edema.  No nausea or vomiting.  Assessment & Plan:   Principal Problem:   Acute on chronic combined systolic and diastolic CHF (congestive heart failure) (HCC) Active Problems:   Demand ischemia (HCC)   Coronary artery disease   CKD (chronic kidney disease) stage 3, GFR 30-59 ml/min (HCC)   Nephrolithiasis   Pyuria   Acute on chronic combined systolic and diastolic heart failure -BNP >4500 -Repeat echocardiogram pending -Cardiology consulted -Continue IV Lasix, Coreg -Strict I's and O's, daily weight, fluid restriction diet  Demand ischemia -Troponin 47--> 54  Coronary artery disease -Continue aspirin, Lipitor, Coreg  CKD stage IIIa -Stable -Repeat BMP pending today  Nephrolithiasis -Renal ultrasound showing 1.3 cm nonobstructing left renal stone, enlarged prostate, bladder wall pathology could not be excluded -He will need outpatient urology follow-up  Pyuria -Urine culture pending   DVT prophylaxis:   enoxaparin (LOVENOX) injection 40 mg Start: 11/21/20 1000  Code Status:     Code Status Orders  (From admission, onward)         Start     Ordered   11/21/20 0329  Full code  Continuous        11/21/20 0328        Code Status History    This patient has a current code status but no historical code status.   Advance Care Planning Activity     Family Communication: No family at bedside Disposition Plan:  Status is: Inpatient  Remains inpatient appropriate because:Ongoing diagnostic testing needed not appropriate for outpatient work up, IV treatments appropriate due to intensity of illness or inability to take PO and Inpatient level of care appropriate due to severity of illness   Dispo: The patient is from: Home              Anticipated d/c is to: Home              Patient currently is not medically stable to d/c.  Await cardiology consultation, echo pending, continue IV Lasix   Difficult to place patient No      Consultants:   Cardiology  Procedures:   None  Antimicrobials:  Anti-infectives (From admission, onward)   Start     Dose/Rate Route Frequency Ordered Stop   11/20/20 1845  cefTRIAXone (ROCEPHIN) 1 g in sodium chloride 0.9 % 100 mL IVPB        1 g 200 mL/hr over 30 Minutes Intravenous  Once 11/20/20 1841 11/20/20 1919   11/20/20 1800  cephALEXin (KEFLEX) capsule 500 mg        500 mg Oral  Once 11/20/20 1745 11/20/20 1803        Objective: Vitals:   11/21/20 0325 11/21/20 0630 11/21/20 0916 11/21/20 1210  BP: (!) 151/117  127/81 (!) 127/91  Pulse: 91  77 76  Resp:   18 20  Temp: 97.7 F (36.5 C)  97.8 F (36.6 C) 97.9 F (36.6 C)  TempSrc: Oral  Oral Oral  SpO2: 100%  100% 100%  Weight:  72.1 kg    Height:  6\' 3"  (1.905 m)      Intake/Output Summary (Last 24 hours) at 11/21/2020 1232 Last data filed at 11/21/2020 1217 Gross per 24 hour  Intake 463 ml  Output 875 ml  Net -412 ml   Filed Weights   11/20/20 1349 11/21/20 0630   Weight: 79.4 kg 72.1 kg    Examination:  General exam: Appears calm and comfortable  Respiratory system: Clear to auscultation. Respiratory effort normal. No respiratory distress. No conversational dyspnea.  On room air Cardiovascular system: S1 & S2 heard, irregular rhythm, normal sinus with PACs on telemetry. No murmurs. No pedal edema. Gastrointestinal system: Abdomen is nondistended, soft and nontender. Normal bowel sounds heard. Central nervous system: Alert and oriented. No focal neurological deficits. Speech clear.  Extremities: Symmetric in appearance  Skin: No rashes, lesions or ulcers on exposed skin  Psychiatry: Judgement and insight appear normal. Mood & affect appropriate.   Data Reviewed: I have personally reviewed following labs and imaging studies  CBC: Recent Labs  Lab 11/20/20 1416 11/21/20 0837  WBC 4.2 4.8  NEUTROABS 2.5  --   HGB 10.2* 10.8*  HCT 32.7* 34.5*  MCV 92.9 93.0  PLT 96* 92*   Basic Metabolic Panel: Recent Labs  Lab 11/20/20 1416  NA 143  K 3.9  CL 112*  CO2 24  GLUCOSE 150*  BUN 19  CREATININE 1.48*  CALCIUM 8.6*   GFR: Estimated Creatinine Clearance: 48 mL/min (A) (by C-G formula based on SCr of 1.48 mg/dL (H)). Liver Function Tests: Recent Labs  Lab 11/20/20 1416  AST 28  ALT 18  ALKPHOS 49  BILITOT 1.2  PROT 7.4  ALBUMIN 3.2*   No results for input(s): LIPASE, AMYLASE in the last 168 hours. No results for input(s): AMMONIA in the last 168 hours. Coagulation Profile: No results for input(s): INR, PROTIME in the last 168 hours. Cardiac Enzymes: No results for input(s): CKTOTAL, CKMB, CKMBINDEX, TROPONINI in the last 168 hours. BNP (last 3 results) No results for input(s): PROBNP in the last 8760 hours. HbA1C: No results for input(s): HGBA1C in the last 72 hours. CBG: No results for input(s): GLUCAP in the last 168 hours. Lipid Profile: No results for input(s): CHOL, HDL, LDLCALC, TRIG, CHOLHDL, LDLDIRECT in the last  72 hours. Thyroid Function Tests: No results for input(s): TSH, T4TOTAL, FREET4, T3FREE, THYROIDAB in the last 72 hours. Anemia Panel: Recent Labs    11/20/20 2130  VITAMINB12 422  FOLATE 8.7  FERRITIN 120  TIBC 395  IRON 49   Sepsis Labs: No results for input(s): PROCALCITON, LATICACIDVEN in the last 168 hours.  Recent Results (from the past 240 hour(s))  Resp Panel by RT-PCR (Flu A&B, Covid) Nasopharyngeal Swab     Status: None   Collection Time: 11/20/20  6:52 PM   Specimen: Nasopharyngeal Swab; Nasopharyngeal(NP) swabs in vial transport medium  Result Value Ref Range Status   SARS Coronavirus 2 by RT PCR NEGATIVE NEGATIVE Final    Comment: (NOTE) SARS-CoV-2 target nucleic acids are NOT DETECTED.  The SARS-CoV-2 RNA is generally detectable in upper respiratory specimens during the acute phase of infection. The lowest concentration of SARS-CoV-2 viral copies this assay can detect is 138 copies/mL. A negative result does not preclude SARS-Cov-2 infection and should not be used as the sole basis for treatment or other patient management decisions. A negative result may occur with  improper specimen collection/handling, submission of specimen other than nasopharyngeal swab, presence of viral mutation(s) within the areas targeted by this assay, and inadequate number of viral copies(<138 copies/mL). A negative result must be combined with clinical observations, patient history, and epidemiological information. The expected result is Negative.  Fact Sheet for Patients:  BloggerCourse.com  Fact Sheet for Healthcare Providers:  SeriousBroker.it  This test is no t yet approved or cleared by the [place] FDA and  has been authorized for detection and/or diagnosis of SARS-CoV-2 by FDA under an Emergency Use Authorization (EUA). This EUA will remain  in effect (meaning this test can be used) for the duration of the COVID-19  declaration under Section 564(b)(1) of the Act, 21 U.S.C.section 360bbb-3(b)(1), unless the authorization is terminated  or revoked sooner.       Influenza A by PCR NEGATIVE NEGATIVE Final   Influenza B by PCR NEGATIVE NEGATIVE Final    Comment: (NOTE) The Xpert Xpress SARS-CoV-2/FLU/RSV plus assay is intended as an aid in the diagnosis of influenza from Nasopharyngeal swab specimens and should not be used as a sole basis for treatment. Nasal washings and aspirates are unacceptable for Xpert Xpress SARS-CoV-2/FLU/RSV testing.  Fact Sheet for Patients: BloggerCourse.com  Fact Sheet for Healthcare Providers: SeriousBroker.it  This test is not yet approved or cleared by the [place] FDA and has been authorized for detection and/or diagnosis of SARS-CoV-2 by FDA under an Emergency Use Authorization (EUA). This EUA will remain in effect (meaning this test can be used) for the duration of the COVID-19 declaration under Section 564(b)(1) of the Act, 21 U.S.C. section 360bbb-3(b)(1), unless the authorization is terminated or revoked.  Performed at [facility], [address] [place], [place] [address]       Radiology Studies: US RENAL  Result Date: 11/21/2020 CLINICAL DATA:  Nephrolithiasis. EXAM: RENAL / URINARY TRACT ULTRASOUND COMPLETE COMPARISON:  None. FINDINGS: Right Kidney: Renal measurements: 10.7 x 4.4 x 4.8 cm = volume: 116.8 mL. Echogenicity within normal limits. 0.7 cm simple cyst. No hydronephrosis visualized. Left Kidney: Renal measurements: 10.3 x 4.8 x 5.6 cm = volume: 146.2 mL. Echogenicity within normal limits. No mass or hydronephrosis visualized. 1.3 cm nonobstructing calyceal stone. Bladder: Bladder is nondistended. Mild thickening of the bladder wall, possibly related to hypertrophy secondary to prostate enlargement. Bladder pathology cannot be excluded. Other: Prostate is enlarged at 4.4 x  3.6 x 5.9 cm. IMPRESSION: 1.  0.7 cm right renal simple cyst. 2.  1.3 cm nonobstructing left renal calyceal stone. 3. Prostate is enlarged. Mild thickening of the bladder wall, possibly related upper tree secondary to prostate enlargement. Bladder wall pathology cannot be excluded. Electronically Signed   By: Maisie Fus  Register   On: 11/21/2020 06:28   DG Chest Portable 1 View  Result Date: 11/20/2020 CLINICAL DATA:  Shortness of breath. EXAM: PORTABLE CHEST 1 VIEW COMPARISON:  None. FINDINGS: Mild cardiomegaly. Normal aortic contours. There is mild bilateral hilar prominence. Mild peribronchial thickening. Subsegmental atelectasis in the left mid lung. No significant pleural effusion. No pneumothorax. No acute osseous abnormalities are seen. IMPRESSION: 1. Mild cardiomegaly. Mild peribronchial thickening of unknown  chronicity. 2. Mild bilateral hilar prominence, likely prominent pulmonary arteries. Electronically Signed   By: Narda Rutherford M.D.   On: 11/20/2020 15:13      Scheduled Meds: . aspirin EC  81 mg Oral Daily  . atorvastatin  40 mg Oral Daily  . carvedilol  6.25 mg Oral BID WC  . enoxaparin (LOVENOX) injection  40 mg Subcutaneous Q24H  . furosemide  40 mg Intravenous Daily  . sodium chloride flush  3 mL Intravenous Q12H   Continuous Infusions: . sodium chloride       LOS: 1 day      Time spent: 40 minutes   Noralee Stain, DO [facility] 11/21/2020, 12:32 PM   Available via Epic secure chat 7am-7pm After these hours, please refer to coverage provider listed on [email or web address]

## 2020-11-21 NOTE — Evaluation (Signed)
Physical Therapy Evaluation Patient Details Name: Joseph Tanner MRN: [id number] DOB: 01-29-1951 Today's Date: 11/21/2020   History of Present Illness  70 yo male with history of CAD/NSTEMI, combined systolic and diastolic HF, HTN, HLD, presents with worsening dyspnea, admitted with CHF  Clinical Impression  Patient evaluated by Physical Therapy with no further acute PT needs identified. All education has been completed and the patient has no further questions.  See below for mobility. HR in 70s to 80s with amb.  See below for any follow-up Physical Therapy or equipment needs. PT is signing off. Thank you for this referral.     Follow Up Recommendations No PT follow up    Equipment Recommendations  None recommended by PT    Recommendations for Other Services       Precautions / Restrictions Precautions Precautions: None Restrictions Weight Bearing Restrictions: No      Mobility  Bed Mobility Overal bed mobility: Independent                  Transfers Overall transfer level: Independent Equipment used: None                Ambulation/Gait Ambulation/Gait assistance: Modified independent (Device/Increase time) Gait Distance (Feet): 240 Feet Assistive device: Straight cane Gait Pattern/deviations: Step-through pattern;WFL(Within Functional Limits)     General Gait Details: no LOB, steady gait; amb short distance in room without device  Stairs            Wheelchair Mobility    Modified Rankin (Stroke Patients Only)       Balance Overall balance assessment: Needs assistance   Sitting balance-Leahy Scale: Normal     Standing balance support: No upper extremity supported;During functional activity;Single extremity supported   Standing balance comment: Fair to good, not tested to mod challenges             High level balance activites: Backward walking;Direction changes;Turns;Head turns High Level Balance Comments: no LOB with  above, no signigicant drifting or other deviations             Pertinent Vitals/Pain Pain Assessment: No/denies pain    Home Living Family/patient expects to be discharged to:: Private residence Living Arrangements: Spouse/significant other Available Help at Discharge: Family   Home Access: Level entry     Home Layout: One level Home Equipment: Cane - single point      Prior Function Level of Independence: Independent;Independent with assistive device(s)         Comments: amb with cane; reports most recent fall ~ 1 month ago without injury     Hand Dominance        Extremity/Trunk Assessment   Upper Extremity Assessment Upper Extremity Assessment: Overall WFL for tasks assessed    Lower Extremity Assessment Lower Extremity Assessment: Overall WFL for tasks assessed       Communication   Communication: No difficulties  Cognition Arousal/Alertness: Awake/alert Behavior During Therapy: WFL for tasks assessed/performed Overall Cognitive Status: Within Functional Limits for tasks assessed                                        General Comments      Exercises     Assessment/Plan    PT Assessment Patent does not need any further PT services  PT Problem List         PT Treatment Interventions  PT Goals (Current goals can be found in the Care Plan section)  Acute Rehab PT Goals Patient Stated Goal: get some food PT Goal Formulation: All assessment and education complete, DC therapy    Frequency     Barriers to discharge        Co-evaluation               AM-PAC PT "6 Clicks" Mobility  Outcome Measure Help needed turning from your back to your side while in a flat bed without using bedrails?: None Help needed moving from lying on your back to sitting on the side of a flat bed without using bedrails?: None Help needed moving to and from a bed to a chair (including a wheelchair)?: None Help needed standing up from a  chair using your arms (e.g., wheelchair or bedside chair)?: None Help needed to walk in hospital room?: None Help needed climbing 3-5 steps with a railing? : None 6 Click Score: 24    End of Session   Activity Tolerance: Patient tolerated treatment well Patient left: in bed;with call bell/phone within reach;with nursing/sitter in room;with bed alarm set Nurse Communication: Mobility status PT Visit Diagnosis: Other abnormalities of gait and mobility (R26.89)    Time: 1423-9532 PT Time Calculation (min) (ACUTE ONLY): 21 min   Charges:   PT Evaluation $PT Eval Low Complexity: 1 Low          Hermann Dottavio, PT  Acute Rehab Dept ([facility]) [phone] Pager [phone]  11/21/2020   [facility] 11/21/2020, 4:22 PM

## 2020-11-22 DIAGNOSIS — I248 Other forms of acute ischemic heart disease: Secondary | ICD-10-CM

## 2020-11-22 DIAGNOSIS — E43 Unspecified severe protein-calorie malnutrition: Secondary | ICD-10-CM

## 2020-11-22 DIAGNOSIS — I426 Alcoholic cardiomyopathy: Secondary | ICD-10-CM

## 2020-11-22 LAB — BASIC METABOLIC PANEL
Anion gap: 11 (ref 5–15)
BUN: 25 mg/dL — ABNORMAL HIGH (ref 8–23)
CO2: 26 mmol/L (ref 22–32)
Calcium: 8.8 mg/dL — ABNORMAL LOW (ref 8.9–10.3)
Chloride: 101 mmol/L (ref 98–111)
Creatinine, Ser: 1.75 mg/dL — ABNORMAL HIGH (ref 0.61–1.24)
GFR, Estimated: 42 mL/min — ABNORMAL LOW (ref >60)
Glucose, Bld: 100 mg/dL — ABNORMAL HIGH (ref 70–99)
Potassium: 3.6 mmol/L (ref 3.5–5.1)
Sodium: 138 mmol/L (ref 135–145)

## 2020-11-22 LAB — CBC
HCT: 32.8 % — ABNORMAL LOW (ref 39.0–52.0)
Hemoglobin: 10.3 g/dL — ABNORMAL LOW (ref 13.0–17.0)
MCH: 28.8 pg (ref 26.0–34.0)
MCHC: 31.4 g/dL (ref 30.0–36.0)
MCV: 91.6 fL (ref 80.0–100.0)
Platelets: 105 10*3/uL — ABNORMAL LOW (ref 150–400)
RBC: 3.58 MIL/uL — ABNORMAL LOW (ref 4.22–5.81)
RDW: 15.2 % (ref 11.5–15.5)
WBC: 4 10*3/uL (ref 4.0–10.5)
nRBC: 0 % (ref 0.0–0.2)

## 2020-11-22 MED ORDER — LIVING BETTER WITH HEART FAILURE BOOK: Freq: Once | Status: DC

## 2020-11-22 NOTE — Hospital Course (Addendum)
70 year old man PMH combined systolic diastolic CHF, recent NSTEMI, nephrolithiasis presented for shortness of breath, edema of the abdomen and hematuria.  Recently moved from [place].  Admitted for acute on chronic systolic, diastolic CHF.  A & P  Acute on chronic combined systolic and diastolic CHF.  LVEF 20-25%, global hypokinesis, grade 3 diastolic dysfunction, mildly reduced right ventricular systolic function --down 3L since admit, improving but creatinine has bumped.  Appears euvolemic.  Stop Lasix.  Repeat BMP in AM.  Demand ischemia --secondary to above, asymptomatic, no further evaluation at this point  CAD, NSTEMI March 2022 OSH w/ d/c on ASA, atorvastatin, Keflex, BiDil, carvedilol.  No Lasix on discharge. -- Remains asymptomatic, will continue aspirin, Lipitor, carvedilol  CKD stage IIIb with normocytic anemia, suspect anemia of CKD --baseline creatinine in care everywhere 1.5  Chronic combined systolic, diastolic CHF with echo LVEF less than 25%, severe global hypokinesis, RV function moderate to severely reduced, grade 1 diastolic dysfunction.  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 --asymptomatic, no pain or urinary symptoms, nonobstructing stone, will not treat bacteruria   Essential hypertension -- Remains stable  Hyperlipidemia

## 2020-11-22 NOTE — Progress Notes (Signed)
PROGRESS NOTE  Joseph Tanner Care [id number]:[id number] DOB: Aug 17, 1950 DOA: 11/20/2020 PCP: Pcp, No  Brief History    70 year old man PMH combined systolic diastolic CHF, recent NSTEMI, nephrolithiasis presented for shortness of breath, edema of the abdomen and hematuria.  Recently moved from [place].  Admitted for acute on chronic systolic, diastolic CHF.  A & P  Acute on chronic combined systolic and diastolic CHF.  LVEF 20-25%, global hypokinesis, grade 3 diastolic dysfunction, mildly reduced right ventricular systolic function --down 2L since admit, clinically improving, continue IV diuresis per cardiology today.  Demand ischemia --secondary to above, asymptomatic  CAD, NSTEMI March 2022 OSH w/ d/c on ASA, atorvastatin, Keflex, BiDil, carvedilol.  No Lasix on discharge. --asymptomatic, continue aspirin, Lipitor, carvedilol  CKD stage IIIa with normocytic anemia, suspect anemia of CKD --baseline creatinine in care everywhere 1.5  Chronic combined systolic, diastolic CHF with echo LVEF less than 25%, severe global hypokinesis, RV function moderate to severely reduced, grade 1 diastolic dysfunction.  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 --asymptomatic, no pain or urinary symptoms, nonobstructing stone, will not treat bacteruria   Essential hypertension --stable  Hyperlipidemia  Nutritional Assessment: Body mass index is 18.16 kg/m.Marland Kitchen Seen by dietician.  I agree with the assessment and plan as outlined below: Nutrition Status: Nutrition Problem: Severe Malnutrition Etiology: chronic illness Signs/Symptoms: energy intake < 75% for > or equal to 1 month,moderate fat depletion,severe fat depletion,moderate muscle depletion,severe muscle depletion Interventions: Magic cup,MVI,Boost Plus  Disposition Plan:  Discussion:   Status is: Inpatient  Remains inpatient appropriate because:IV treatments appropriate due  to intensity of illness or inability to take PO and Inpatient level of care appropriate due to severity of illness   Dispo: The patient is from: Home              Anticipated d/c is to: Home              Patient currently is not medically stable to d/c.   Difficult to place patient No  DVT prophylaxis: enoxaparin (LOVENOX) injection 40 mg Start: 11/21/20 1000   Code Status: Full Code Level of care: Telemetry Family Communication: none  Brendia Sacks, MD  [facility] Direct contact: see [email or web address] (further directions at bottom of note if needed) 7PM-7AM contact night coverage as at bottom of note 11/22/2020, 3:13 PM  LOS: 2 days   Consults:  . Cardiology     Significant Diagnostic Tests:  .   Interval History/Subjective  CC: f/u CHF  Feels a whole lot better, breathing better, no orthopnea, no LE edema No abd pain or back pain, no n/v, no urinary complaints voiced  Objective   Vitals:  Vitals:   11/22/20 0632 11/22/20 1307  BP: 129/85 106/86  Pulse: 77 80  Resp: 18 18  Temp: 98.1 F (36.7 C) 98.1 F (36.7 C)  SpO2: 100% 100%    Exam:  Constitutional:   . Appears calm and comfortable sitting in chair ENMT:  . grossly normal hearing  . Lips appear normal Respiratory:  . CTA bilaterally, no w/r/r.  . Respiratory effort normal.  Cardiovascular:  . RRR, no m/r/g . Telemetry SR  . No LE extremity edema   Abdomen:  . Soft ndnt . No CVA tenderness Psychiatric:  . Mental status o Mood, affect appropriate . judgment and insight appear intact    I have personally reviewed the following:   Today's Data  . UOP 2102 . Cr up to 1.75 . Plts stable 105  Scheduled Meds: . aspirin EC  81 mg Oral Daily  . atorvastatin  40 mg Oral Daily  . carvedilol  6.25 mg Oral BID WC  . enoxaparin (LOVENOX) injection  40 mg Subcutaneous Q24H  . furosemide  80 mg Intravenous BID  . lactose free nutrition  237 mL Oral TID WC  . multivitamin with minerals  1 tablet  Oral Daily  . sodium chloride flush  3 mL Intravenous Q12H   Continuous Infusions: . sodium chloride      Principal Problem:   Acute on chronic combined systolic and diastolic CHF (congestive heart failure) (HCC) Active Problems:   Demand ischemia (HCC)   Coronary artery disease   CKD (chronic kidney disease) stage 3, GFR 30-59 ml/min (HCC)   Nephrolithiasis   Pyuria   Protein-calorie malnutrition, severe   Severe malnutrition (HCC)   LOS: 2 days   How to contact the [facility] Attending or Consulting provider 7A - 7P or covering provider during after hours 7P -7A, for this patient?  1. Check the care team in [facility] and look for a) attending/consulting [facility] provider listed and b) the [facility] team listed 2. Log into [email or web address] and use [place]'s universal password to access. If you do not have the password, please contact the hospital operator. 3. Locate the [facility] provider you are looking for under [facility] and page to a number that you can be directly reached. 4. If you still have difficulty reaching the provider, please page the [facility] (Director on Call) for the Hospitalists listed on amion for assistance.

## 2020-11-22 NOTE — Progress Notes (Signed)
Progress Note  Patient Name: Joseph Tanner Date of Encounter: 11/22/2020  [facility] Cardiologist: Chrystie Nose, MD new  Subjective   Pt feels much better today. He never sleeps with head of bed flat.   Inpatient Medications    Scheduled Meds: . aspirin EC  81 mg Oral Daily  . atorvastatin  40 mg Oral Daily  . carvedilol  6.25 mg Oral BID WC  . enoxaparin (LOVENOX) injection  40 mg Subcutaneous Q24H  . furosemide  80 mg Intravenous BID  . lactose free nutrition  237 mL Oral TID WC  . multivitamin with minerals  1 tablet Oral Daily  . sodium chloride flush  3 mL Intravenous Q12H   Continuous Infusions: . sodium chloride     PRN Meds: sodium chloride, acetaminophen, hydrALAZINE, labetalol, ondansetron (ZOFRAN) IV, sodium chloride flush   Vital Signs    Vitals:   11/21/20 1210 11/21/20 1700 11/21/20 2142 11/22/20 0632  BP: (!) 127/91 127/81 128/75 129/85  Pulse: 76 77 72 77  Resp: 20 20 18 18   Temp: 97.9 F (36.6 C) 97.8 F (36.6 C) 97.7 F (36.5 C) 98.1 F (36.7 C)  TempSrc: Oral Oral Oral Oral  SpO2: 100% 100% 100% 100%  Weight:    65.9 kg  Height:        Intake/Output Summary (Last 24 hours) at 11/22/2020 0919 Last data filed at 11/22/2020 0640 Gross per 24 hour  Intake 720 ml  Output 2725 ml  Net -2005 ml   Last 3 Weights 11/22/2020 11/21/2020 11/20/2020  Weight (lbs) 145 lb 4.5 oz 158 lb 15.2 oz 175 lb  Weight (kg) 65.9 kg 72.1 kg 79.379 kg      Telemetry    Sinus rhythm, HR 60-70s, PVCs, NSVT - Personally Reviewed  ECG    No new tracings - Personally Reviewed  Physical Exam   GEN: No acute distress.   Neck: No JVD Cardiac: RRR, no murmurs, rubs, or gallops.  Respiratory: occasional rhonchi GI: Soft, nontender, non-distended  MS: minimal B LE edema; No deformity. Neuro:  Nonfocal  Psych: Normal affect   Labs    High Sensitivity Troponin:   Recent Labs  Lab 11/20/20 1416 11/20/20 1718  TROPONINIHS 47* 54*       Chemistry Recent Labs  Lab 11/20/20 1416 11/21/20 0837 11/22/20 0331  NA 143 139 138  K 3.9 3.8 3.6  CL 112* 105 101  CO2 24 23 26   GLUCOSE 150* 105* 100*  BUN 19 21 25*  CREATININE 1.48* 1.66* 1.75*  CALCIUM 8.6* 9.0 8.8*  PROT 7.4  --   --   ALBUMIN 3.2*  --   --   AST 28  --   --   ALT 18  --   --   ALKPHOS 49  --   --   BILITOT 1.2  --   --   GFRNONAA 51* 44* 42*  ANIONGAP 7 11 11      Hematology Recent Labs  Lab 11/20/20 1416 11/21/20 0837 11/22/20 0331  WBC 4.2 4.8 4.0  RBC 3.52* 3.71* 3.58*  HGB 10.2* 10.8* 10.3*  HCT 32.7* 34.5* 32.8*  MCV 92.9 93.0 91.6  MCH 29.0 29.1 28.8  MCHC 31.2 31.3 31.4  RDW 15.5 15.4 15.2  PLT 96* 92* 105*    BNP Recent Labs  Lab 11/20/20 1416  BNP >4,500.0*     DDimer No results for input(s): DDIMER in the last 168 hours.   Radiology  US RENAL  Result Date: 11/21/2020 CLINICAL DATA:  Nephrolithiasis. EXAM: RENAL / URINARY TRACT ULTRASOUND COMPLETE COMPARISON:  None. FINDINGS: Right Kidney: Renal measurements: 10.7 x 4.4 x 4.8 cm = volume: 116.8 mL. Echogenicity within normal limits. 0.7 cm simple cyst. No hydronephrosis visualized. Left Kidney: Renal measurements: 10.3 x 4.8 x 5.6 cm = volume: 146.2 mL. Echogenicity within normal limits. No mass or hydronephrosis visualized. 1.3 cm nonobstructing calyceal stone. Bladder: Bladder is nondistended. Mild thickening of the bladder wall, possibly related to hypertrophy secondary to prostate enlargement. Bladder pathology cannot be excluded. Other: Prostate is enlarged at 4.4 x 3.6 x 5.9 cm. IMPRESSION: 1.  0.7 cm right renal simple cyst. 2.  1.3 cm nonobstructing left renal calyceal stone. 3. Prostate is enlarged. Mild thickening of the bladder wall, possibly related upper tree secondary to prostate enlargement. Bladder wall pathology cannot be excluded. Electronically Signed   By: Maisie Fushomas  Register   On: 11/21/2020 06:28   DG Chest Portable 1 View  Result Date:  11/20/2020 CLINICAL DATA:  Shortness of breath. EXAM: PORTABLE CHEST 1 VIEW COMPARISON:  None. FINDINGS: Mild cardiomegaly. Normal aortic contours. There is mild bilateral hilar prominence. Mild peribronchial thickening. Subsegmental atelectasis in the left mid lung. No significant pleural effusion. No pneumothorax. No acute osseous abnormalities are seen. IMPRESSION: 1. Mild cardiomegaly. Mild peribronchial thickening of unknown chronicity. 2. Mild bilateral hilar prominence, likely prominent pulmonary arteries. Electronically Signed   By: Narda RutherfordMelanie  Sanford M.D.   On: 11/20/2020 15:13   ECHOCARDIOGRAM COMPLETE  Result Date: 11/21/2020    ECHOCARDIOGRAM REPORT   Patient Name:   [name] [facility] Date of Exam: 11/21/2020 Medical Rec #:  [id number]               Height:       75.0 in Accession #:    [id number]              Weight:       159.0 lb Date of Birth:  11/06/1950                BSA:          1.990 m Patient Age:    70 years                BP:           151/117 mmHg Patient Gender: M                       HR:           78 bpm. Exam Location:  Inpatient Procedure: 2D Echo, Cardiac Doppler and Color Doppler Indications:    CHF-Acute Systolic I50.21  History:        Patient has no prior history of Echocardiogram examinations.                 Previous Myocardial Infarction; Risk Factors:Dyslipidemia and                 Hypertension.  Sonographer:    Eulah PontSarah Pirrotta RDCS Referring Phys: [phone] DAVID GIRGUIS IMPRESSIONS  1. Left ventricular ejection fraction, by estimation, is 20 to 25%. The left ventricle has severely decreased function. The left ventricle demonstrates global hypokinesis. The left ventricular internal cavity size was moderately dilated. Left ventricular diastolic parameters are consistent with Grade III diastolic dysfunction (restrictive). Elevated left atrial pressure.  2. Right ventricular systolic function is mildly reduced. The right ventricular size is mildly enlarged. There is  normal  pulmonary artery systolic pressure.  3. Left atrial size was moderately dilated.  4. Right atrial size was severely dilated.  5. The mitral valve is normal in structure. Trivial mitral valve regurgitation.  6. The aortic valve is tricuspid. Aortic valve regurgitation is trivial. No aortic stenosis is present.  7. The inferior vena cava is dilated in size with <50% respiratory variability, suggesting right atrial pressure of 15 mmHg. FINDINGS  Left Ventricle: Left ventricular ejection fraction, by estimation, is 20 to 25%. The left ventricle has severely decreased function. The left ventricle demonstrates global hypokinesis. The left ventricular internal cavity size was moderately dilated. There is no left ventricular hypertrophy. Left ventricular diastolic parameters are consistent with Grade III diastolic dysfunction (restrictive). Elevated left atrial pressure. Right Ventricle: The right ventricular size is mildly enlarged. No increase in right ventricular wall thickness. Right ventricular systolic function is mildly reduced. There is normal pulmonary artery systolic pressure. The tricuspid regurgitant velocity  is 2.19 m/s, and with an assumed right atrial pressure of 15 mmHg, the estimated right ventricular systolic pressure is 34.2 mmHg. Left Atrium: Left atrial size was moderately dilated. Right Atrium: Right atrial size was severely dilated. Pericardium: There is no evidence of pericardial effusion. Mitral Valve: The mitral valve is normal in structure. Mild mitral annular calcification. Trivial mitral valve regurgitation. Tricuspid Valve: The tricuspid valve is normal in structure. Tricuspid valve regurgitation is trivial. Aortic Valve: The aortic valve is tricuspid. Aortic valve regurgitation is trivial. No aortic stenosis is present. Pulmonic Valve: The pulmonic valve was normal in structure. Pulmonic valve regurgitation is mild. Aorta: The aortic root is normal in size and structure. Venous: The inferior  vena cava is dilated in size with less than 50% respiratory variability, suggesting right atrial pressure of 15 mmHg. IAS/Shunts: No atrial level shunt detected by color flow Doppler.  LEFT VENTRICLE PLAX 2D LVIDd:         6.40 cm      Diastology LVIDs:         5.63 cm      LV e' medial:    2.70 cm/s LV PW:         1.20 cm      LV E/e' medial:  36.5 LV IVS:        1.00 cm      LV e' lateral:   4.22 cm/s LVOT diam:     2.10 cm      LV E/e' lateral: 23.3 LV SV:         60 LV SV Index:   30 LVOT Area:     3.46 cm  LV Volumes (MOD) LV vol d, MOD A2C: 174.0 ml LV vol d, MOD A4C: 176.0 ml LV vol s, MOD A2C: 130.0 ml LV vol s, MOD A4C: 116.0 ml LV SV MOD A2C:     44.0 ml LV SV MOD A4C:     176.0 ml LV SV MOD BP:      53.5 ml RIGHT VENTRICLE TAPSE (M-mode): 1.6 cm LEFT ATRIUM             Index       RIGHT ATRIUM           Index LA diam:        4.70 cm 2.36 cm/m  RA Area:     21.70 cm LA Vol (A2C):   41.9 ml 21.05 ml/m RA Volume:   74.20 ml  37.28 ml/m LA Vol (A4C):   43.5 ml  21.86 ml/m LA Biplane Vol: 45.0 ml 22.61 ml/m  AORTIC VALVE LVOT Vmax:   105.60 cm/s LVOT Vmean:  68.500 cm/s LVOT VTI:    0.173 m  AORTA Ao Root diam: 3.50 cm Ao Asc diam:  3.70 cm MITRAL VALVE               TRICUSPID VALVE MV Area (PHT): 5.36 cm    TR Peak grad:   19.2 mmHg MV Decel Time: 142 msec    TR Vmax:        219.00 cm/s MV E velocity: 98.47 cm/s MV A velocity: 23.77 cm/s  SHUNTS MV E/A ratio:  4.14        Systemic VTI:  0.17 m                            Systemic Diam: 2.10 cm Thurmon Fair MD Electronically signed by Thurmon Fair MD Signature Date/Time: 11/21/2020/1:09:52 PM    Final     Cardiac Studies   Echo 11/21/20: 1. Left ventricular ejection fraction, by estimation, is 20 to 25%. The  left ventricle has severely decreased function. The left ventricle  demonstrates global hypokinesis. The left ventricular internal cavity size  was moderately dilated. Left  ventricular diastolic parameters are consistent with Grade III  diastolic  dysfunction (restrictive). Elevated left atrial pressure.  2. Right ventricular systolic function is mildly reduced. The right  ventricular size is mildly enlarged. There is normal pulmonary artery  systolic pressure.  3. Left atrial size was moderately dilated.  4. Right atrial size was severely dilated.  5. The mitral valve is normal in structure. Trivial mitral valve  regurgitation.  6. The aortic valve is tricuspid. Aortic valve regurgitation is trivial.  No aortic stenosis is present.  7. The inferior vena cava is dilated in size with <50% respiratory  variability, suggesting right atrial pressure of 15 mmHg.   Patient Profile     70 y.o. male with history of CAD/NSTEMI, combined systolic and diastolic HF, HTN, HLD, presents with worsening dyspnea.  Assessment & Plan    Acute on chronic systolic and diastolic heart failure - echo this admission with EF 20-25%, grade III DD, mildly reduced RV function, severely dilated right atrium, moderately dilated left atrium.  - BNP > 4500 - EF declined since March 2022 - given worsening renal function, lasix increased to 80 mg IV BID yesterday - he is overall net negative 2.1 with 2.7 L urine output yesterday - weight is down 30 lbs since admission, 13 lbs overnight - continue lasix 80 mg IV BID today - consider reducing to 40 mg PO lasix tomorrow - will need to follow renal function closely - plan to add entresto eventually if renal function allows - BP currently well controlled   CAD  - heart cath in March 2022 in [place] showed nonobstructive disease in RCA and severe stenosis in a small Cx artery - no intervention - continue ASA - hs troponin mild and flat - no chest pain - no heparin    Acute on CKD stage III - sCr  1.75 (1.66) - renal function worsening - will continue lasix today, plan for PO lasix tomorrow - may need to consult nephrology - will hopefully be able to tolerate ARNI prior to  discharge   Alcohol use - pt reports significant social drinking - have advised reduction in intake given cardiomyopathy   Hyperlipidemia with LDL goal < 70 - will  add on lipid profile tomorrow      For questions or updates, please contact [facility] Please consult [email or web address] for contact info under        Signed, Marcelino Duster, PA  11/22/2020, 9:19 AM

## 2020-11-22 NOTE — Discharge Instr - Supplementary Instructions (Signed)
Contact your insurance company at [phone] to find out what physician are in network with you.  Also you can go to [email or web address] and go to find a doctor.

## 2020-11-22 NOTE — TOC Progression Note (Signed)
Transition of Care [facility]) - Progression Note    Patient Details  Name: Joseph Tanner MRN: [id number] Date of Birth: 07-Nov-1950  Transition of Care [facility] [place] [facility]) CM/SW Contact  Darleene Cleaver, [place] Phone Number: 11/22/2020, 4:35 PM  Clinical Narrative:     CSW received consult that patient needs a PCP.  CSW provided phone number and website in his AVS for him to call once he discharges from hospital.     Expected Discharge Plan and Services  PT is not recommending any follow up.                    Social Determinants of Health (SDOH) Interventions    Readmission Risk Interventions No flowsheet data found.

## 2020-11-23 LAB — BASIC METABOLIC PANEL
Anion gap: 12 (ref 5–15)
BUN: 32 mg/dL — ABNORMAL HIGH (ref 8–23)
CO2: 28 mmol/L (ref 22–32)
Calcium: 9 mg/dL (ref 8.9–10.3)
Chloride: 96 mmol/L — ABNORMAL LOW (ref 98–111)
Creatinine, Ser: 1.99 mg/dL — ABNORMAL HIGH (ref 0.61–1.24)
GFR, Estimated: 36 mL/min — ABNORMAL LOW (ref >60)
Glucose, Bld: 133 mg/dL — ABNORMAL HIGH (ref 70–99)
Potassium: 3.3 mmol/L — ABNORMAL LOW (ref 3.5–5.1)
Sodium: 136 mmol/L (ref 135–145)

## 2020-11-23 LAB — URINE CULTURE: Culture: >=100000 — AB

## 2020-11-23 LAB — CBC
HCT: 38.3 % — ABNORMAL LOW (ref 39.0–52.0)
Hemoglobin: 11.9 g/dL — ABNORMAL LOW (ref 13.0–17.0)
MCH: 28.5 pg (ref 26.0–34.0)
MCHC: 31.1 g/dL (ref 30.0–36.0)
MCV: 91.8 fL (ref 80.0–100.0)
Platelets: 130 10*3/uL — ABNORMAL LOW (ref 150–400)
RBC: 4.17 MIL/uL — ABNORMAL LOW (ref 4.22–5.81)
RDW: 15.1 % (ref 11.5–15.5)
WBC: 3.6 10*3/uL — ABNORMAL LOW (ref 4.0–10.5)
nRBC: 0 % (ref 0.0–0.2)

## 2020-11-23 LAB — LIPID PANEL
Cholesterol: 123 mg/dL (ref 0–200)
HDL: 39 mg/dL — ABNORMAL LOW (ref >40)
LDL Cholesterol: 56 mg/dL (ref 0–99)
Total CHOL/HDL Ratio: 3.2 RATIO
Triglycerides: 138 mg/dL (ref <150)
VLDL: 28 mg/dL (ref 0–40)

## 2020-11-23 MED ORDER — FUROSEMIDE 40 MG PO TABS: 40.0000 mg | ORAL_TABLET | Freq: Every day | ORAL | Status: DC

## 2020-11-23 MED ORDER — POTASSIUM CHLORIDE CRYS ER 10 MEQ PO TBCR: 5.0000 meq | EXTENDED_RELEASE_TABLET | Freq: Once | ORAL | Status: DC

## 2020-11-23 MED ORDER — SACUBITRIL-VALSARTAN 24-26 MG PO TABS
1.0000 | ORAL_TABLET | Freq: Two times a day (BID) | ORAL | Status: DC
  Administered 2020-11-24: 1 via ORAL
  Filled 2020-11-23 (×2): qty 1

## 2020-11-23 NOTE — Care Management Important Message (Signed)
Important Message  Patient Details IM Letter given to the Patient Name: Joseph Tanner MRN: [id number] Date of Birth: 01-14-1951   Medicare Important Message Given:  Yes     Caren Macadam 11/23/2020, 11:31 AM

## 2020-11-23 NOTE — Progress Notes (Signed)
Progress Note  Patient Name: Joseph Tanner Date of Encounter: 11/23/2020  [facility] Cardiologist: Chrystie Nose, MD   Subjective   Feels well today, no dyspnea  Inpatient Medications    Scheduled Meds: . aspirin EC  81 mg Oral Daily  . atorvastatin  40 mg Oral Daily  . carvedilol  6.25 mg Oral BID WC  . enoxaparin (LOVENOX) injection  40 mg Subcutaneous Q24H  . lactose free nutrition  237 mL Oral TID WC  . Living Better with Heart Failure Book   Does not apply Once  . multivitamin with minerals  1 tablet Oral Daily  . potassium chloride  5 mEq Oral Once  . sodium chloride flush  3 mL Intravenous Q12H   Continuous Infusions: . sodium chloride     PRN Meds: sodium chloride, acetaminophen, hydrALAZINE, labetalol, ondansetron (ZOFRAN) IV, sodium chloride flush   Vital Signs    Vitals:   11/22/20 0632 11/22/20 1307 11/22/20 2144 11/23/20 0603  BP: 129/85 106/86 115/73 123/87  Pulse: 77 80 80 73  Resp: 18 18 18 18   Temp: 98.1 F (36.7 C) 98.1 F (36.7 C) 97.7 F (36.5 C) 98.2 F (36.8 C)  TempSrc: Oral Oral Oral Oral  SpO2: 100% 100% 98% 100%  Weight: 65.9 kg   69.3 kg  Height:        Intake/Output Summary (Last 24 hours) at 11/23/2020 1315 Last data filed at 11/23/2020 0232 Gross per 24 hour  Intake 120 ml  Output 1400 ml  Net -1280 ml   Last 3 Weights 11/23/2020 11/22/2020 11/21/2020  Weight (lbs) 152 lb 12.5 oz 145 lb 4.5 oz 158 lb 15.2 oz  Weight (kg) 69.3 kg 65.9 kg 72.1 kg      Telemetry    Sinus 70-80s, PACs, PVCs - Personally Reviewed  ECG    No new tracings - Personally Reviewed  Physical Exam   GEN: No acute distress.   Neck: No JVD Cardiac: RRR, no murmurs, rubs, or gallops.  Respiratory: Clear to auscultation bilaterally. GI: Soft, nontender, non-distended  MS: minimal B LE edema; No deformity. Neuro:  Nonfocal  Psych: Normal affect   Labs    High Sensitivity Troponin:   Recent Labs  Lab 11/20/20 1416  11/20/20 1718  TROPONINIHS 47* 54*      Chemistry Recent Labs  Lab 11/20/20 1416 11/21/20 0837 11/22/20 0331 11/23/20 0938  NA 143 139 138 136  K 3.9 3.8 3.6 3.3*  CL 112* 105 101 96*  CO2 24 23 26 28   GLUCOSE 150* 105* 100* 133*  BUN 19 21 25* 32*  CREATININE 1.48* 1.66* 1.75* 1.99*  CALCIUM 8.6* 9.0 8.8* 9.0  PROT 7.4  --   --   --   ALBUMIN 3.2*  --   --   --   AST 28  --   --   --   ALT 18  --   --   --   ALKPHOS 49  --   --   --   BILITOT 1.2  --   --   --   GFRNONAA 51* 44* 42* 36*  ANIONGAP 7 11 11 12      Hematology Recent Labs  Lab 11/21/20 0837 11/22/20 0331 11/23/20 0938  WBC 4.8 4.0 3.6*  RBC 3.71* 3.58* 4.17*  HGB 10.8* 10.3* 11.9*  HCT 34.5* 32.8* 38.3*  MCV 93.0 91.6 91.8  MCH 29.1 28.8 28.5  MCHC 31.3 31.4 31.1  RDW 15.4 15.2 15.1  PLT 92* 105* 130*    BNP Recent Labs  Lab 11/20/20 1416  BNP >4,500.0*     DDimer No results for input(s): DDIMER in the last 168 hours.   Radiology    No results found.  Cardiac Studies   Echo 11/21/20: 1. Left ventricular ejection fraction, by estimation, is 20 to 25%. The  left ventricle has severely decreased function. The left ventricle  demonstrates global hypokinesis. The left ventricular internal cavity size  was moderately dilated. Left  ventricular diastolic parameters are consistent with Grade III diastolic  dysfunction (restrictive). Elevated left atrial pressure.  2. Right ventricular systolic function is mildly reduced. The right  ventricular size is mildly enlarged. There is normal pulmonary artery  systolic pressure.  3. Left atrial size was moderately dilated.  4. Right atrial size was severely dilated.  5. The mitral valve is normal in structure. Trivial mitral valve  regurgitation.  6. The aortic valve is tricuspid. Aortic valve regurgitation is trivial.  No aortic stenosis is present.  7. The inferior vena cava is dilated in size with <50% respiratory  variability,  suggesting right atrial pressure of 15 mmHg.   Patient Profile     70 y.o. male with history of CAD/NSTEMI, combined systolic and diastolic HF, HTN, HLD, presents with worsening dyspnea.  Assessment & Plan    Acute on chronic systolic and diastolic heart failure - echo this admission with EF 20-25%, grade III DD, mildly reduced RV function, severely dilated right atrium, moderately dilated left atrium - BNP > 4500 - EF declined since March 2022 - renal function continues to worsen --> D/C'ed further IV lasix today after this morning's dose - hold lasix tomorrow - if renal function stabilizes, consider starting low dose entresto tomorrow vs 40 mg PO lasix - weight 152 lbs today, up 7 lbs overnight, but down 23 lbs since admission - he is overall net negative 3.5 L with 1.7 L urine output yesterday   CAD - heart cath in March 2022 in [place] showed nonobstructive disease in the RCA and severe stenosis in a small Cx - no inervention - continue ASA - hs troponin mild and flat - no chest pain - no indication for heparin   Acute on chronic renal insufficiency III - sCr is now 1.99 - D/C'ed IV lasix after this morning's dose - follow BMP tomorrow - given his degree of CHF, would have a low threshold to consult nephrology   Hypokalemia - PVCs and PACs on telemetry - K 3.3 - will give 5 mEq potassium - caution given declining renal function   Alcohol use - pt reports significant social drinking - advised reduction on intake given cardiomyopathy   Hyperlipidemia with LDL goal < 70 11/23/2020: Cholesterol 123; HDL 39; LDL Cholesterol 56; Triglycerides 138; VLDL 28 - continue 40 mg lipitor       For questions or updates, please contact [facility] Please consult [email or web address] for contact info under        Signed, Marcelino Duster, PA  11/23/2020, 1:15 PM

## 2020-11-23 NOTE — Progress Notes (Signed)
PROGRESS NOTE  Joseph Tanner RCV:[id number] DOB: 07-20-1951 DOA: 11/20/2020 PCP: Pcp, No  Brief History    70 year old man PMH combined systolic diastolic CHF, recent NSTEMI, nephrolithiasis presented for shortness of breath, edema of the abdomen and hematuria.  Recently moved from [place].  Admitted for acute on chronic systolic, diastolic CHF.  A & P  Acute on chronic combined systolic and diastolic CHF.  LVEF 20-25%, global hypokinesis, grade 3 diastolic dysfunction, mildly reduced right ventricular systolic function --down 3L since admit, improving but creatinine has bumped.  Appears euvolemic.  Stop Lasix.  Repeat BMP in AM.  Demand ischemia --secondary to above, asymptomatic, no further evaluation at this point  CAD, NSTEMI March 2022 OSH w/ d/c on ASA, atorvastatin, Keflex, BiDil, carvedilol.  No Lasix on discharge. -- Remains asymptomatic, will continue aspirin, Lipitor, carvedilol  CKD stage IIIb with normocytic anemia, suspect anemia of CKD --baseline creatinine in care everywhere 1.5  Chronic combined systolic, diastolic CHF with echo LVEF less than 25%, severe global hypokinesis, RV function moderate to severely reduced, grade 1 diastolic dysfunction.  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 --asymptomatic, no pain or urinary symptoms, nonobstructing stone, will not treat bacteruria   Essential hypertension -- Remains stable  Hyperlipidemia  Nutritional Assessment: Body mass index is 19.1 kg/m.Marland Kitchen Seen by dietician.  I agree with the assessment and plan as outlined below: Nutrition Status: Nutrition Problem: Severe Malnutrition Etiology: chronic illness Signs/Symptoms: energy intake < 75% for > or equal to 1 month,moderate fat depletion,severe fat depletion,moderate muscle depletion,severe muscle depletion Interventions: Magic cup,MVI,Boost Plus  Disposition Plan:  Discussion: improving,  hold Lasix today, BMP in AM, hopefully home 1-2 days  Status is: Inpatient  Remains inpatient appropriate because:IV treatments appropriate due to intensity of illness or inability to take PO and Inpatient level of care appropriate due to severity of illness   Dispo: The patient is from: Home              Anticipated d/c is to: Home              Patient currently is not medically stable to d/c.   Difficult to place patient No  DVT prophylaxis: enoxaparin (LOVENOX) injection 40 mg Start: 11/21/20 1000   Code Status: Full Code Level of care: Telemetry Family Communication: none  Brendia Sacks, MD  [facility] Direct contact: see [email or web address] (further directions at bottom of note if needed) 7PM-7AM contact night coverage as at bottom of note 11/23/2020, 2:48 PM  LOS: 3 days   Consults:  . Cardiology     Significant Diagnostic Tests:  .   Interval History/Subjective  CC: f/u CHF  No problems overnight, breathing fine, no complaints.  Objective   Vitals:  Vitals:   11/22/20 2144 11/23/20 0603  BP: 115/73 123/87  Pulse: 80 73  Resp: 18 18  Temp: 97.7 F (36.5 C) 98.2 F (36.8 C)  SpO2: 98% 100%    Exam:  Constitutional:   . Appears calm and comfortable ENMT:  . grossly normal hearing  Respiratory:  . CTA bilaterally, no w/r/r.  . Respiratory effort normal. Cardiovascular:  . RRR, no m/r/g . No LE extremity edema   Psychiatric:  . Mental status o Mood, affect appropriate  I have personally reviewed the following:   Today's Data  . UOP 1750 . -3.4L since admission . Cr up to 1.99 . Plts up to 130  Scheduled Meds: . aspirin EC  81 mg Oral Daily  .  atorvastatin  40 mg Oral Daily  . carvedilol  6.25 mg Oral BID WC  . enoxaparin (LOVENOX) injection  40 mg Subcutaneous Q24H  . lactose free nutrition  237 mL Oral TID WC  . Living Better with Heart Failure Book   Does not apply Once  . multivitamin with minerals  1 tablet Oral Daily  . sodium  chloride flush  3 mL Intravenous Q12H   Continuous Infusions: . sodium chloride      Principal Problem:   Acute on chronic combined systolic and diastolic CHF (congestive heart failure) (HCC) Active Problems:   Demand ischemia (HCC)   Coronary artery disease   CKD (chronic kidney disease) stage 3, GFR 30-59 ml/min (HCC)   Nephrolithiasis   Pyuria   Protein-calorie malnutrition, severe   Severe malnutrition (HCC)   LOS: 3 days   How to contact the [facility] Attending or Consulting provider 7A - 7P or covering provider during after hours 7P -7A, for this patient?  1. Check the care team in [facility] and look for a) attending/consulting [facility] provider listed and b) the [facility] team listed 2. Log into [email or web address] and use [place]'s universal password to access. If you do not have the password, please contact the hospital operator. 3. Locate the [facility] provider you are looking for under [facility] and page to a number that you can be directly reached. 4. If you still have difficulty reaching the provider, please page the [facility] (Director on Call) for the Hospitalists listed on amion for assistance.

## 2020-11-23 NOTE — Plan of Care (Signed)
  Problem: Education: Goal: Knowledge of General Education information will improve Description: Including pain rating scale, medication(s)/side effects and non-pharmacologic comfort measures Outcome: Progressing   Problem: Activity: Goal: Risk for activity intolerance will decrease Outcome: Progressing   Problem: Nutrition: Goal: Adequate nutrition will be maintained Outcome: Progressing   Problem: Elimination: Goal: Will not experience complications related to bowel motility Outcome: Progressing Goal: Will not experience complications related to urinary retention Outcome: Progressing   Problem: Pain Managment: Goal: General experience of comfort will improve Outcome: Progressing   Problem: Safety: Goal: Ability to remain free from injury will improve Outcome: Progressing   Problem: Skin Integrity: Goal: Risk for impaired skin integrity will decrease Outcome: Progressing   Problem: Education: Goal: Ability to demonstrate management of disease process will improve Outcome: Progressing Goal: Ability to verbalize understanding of medication therapies will improve Outcome: Progressing Goal: Individualized Educational Video(s) Outcome: Progressing   Problem: Activity: Goal: Capacity to carry out activities will improve Outcome: Progressing   Problem: Cardiac: Goal: Ability to achieve and maintain adequate cardiopulmonary perfusion will improve Outcome: Progressing

## 2020-11-24 LAB — BASIC METABOLIC PANEL
Anion gap: 10 (ref 5–15)
BUN: 38 mg/dL — ABNORMAL HIGH (ref 8–23)
CO2: 27 mmol/L (ref 22–32)
Calcium: 8.9 mg/dL (ref 8.9–10.3)
Chloride: 98 mmol/L (ref 98–111)
Creatinine, Ser: 2.07 mg/dL — ABNORMAL HIGH (ref 0.61–1.24)
GFR, Estimated: 34 mL/min — ABNORMAL LOW (ref >60)
Glucose, Bld: 104 mg/dL — ABNORMAL HIGH (ref 70–99)
Potassium: 3.6 mmol/L (ref 3.5–5.1)
Sodium: 135 mmol/L (ref 135–145)

## 2020-11-24 MED ORDER — SACUBITRIL-VALSARTAN 24-26 MG PO TABS: 1.0000 | ORAL_TABLET | Freq: Two times a day (BID) | ORAL | 2 refills | Status: DC

## 2020-11-24 MED ORDER — ADULT MULTIVITAMIN W/MINERALS CH: 1.0000 | ORAL_TABLET | Freq: Every day | ORAL | Status: DC

## 2020-11-24 MED ORDER — ASPIRIN 81 MG PO TBEC: 81.0000 mg | DELAYED_RELEASE_TABLET | Freq: Every day | ORAL | Status: DC

## 2020-11-24 NOTE — Discharge Summary (Addendum)
Physician Discharge Summary  Joseph Tanner ZOX:[id number] DOB: 03/26/1951 DOA: 11/20/2020  PCP: Pcp, No Patient was given number to call by TOC to contact his insurance to obtain a PCP here in town.  Admit date: 11/20/2020 Discharge date: 11/24/2020  Recommendations for Outpatient Follow-up:  1. Discussed with Dr. Rennis GoldenHilty, plan outpatient follow-up next week for lab work and cardiology follow-up. 2.  1.3 cm nonobstructing left renal calyceal stone. Prostate is enlarged. Mild thickening of the bladder wall, possibly related upper tree secondary to prostate enlargement. Bladder wall pathology cannot be excluded.    Follow-up Information    Joseph Tanner, Joseph D III, MD Follow up on 12/21/2020.   Specialty: Urology Why: 3:30 Contact information: [address] [place] [id number] [phone]        Chilton Siandolph, [name], MD Follow up on 11/30/2020.   Specialty: Cardiology Why: 9:40 Contact information: [address] [place] [address] [phone]                Discharge Diagnoses: Principal diagnosis is #1 Principal Problem:   Acute on chronic combined systolic and diastolic CHF (congestive heart failure) (HCC) Active Problems:   Demand ischemia (HCC)   Coronary artery disease   CKD (chronic kidney disease) stage 3, GFR 30-59 ml/min (HCC)   Nephrolithiasis   Pyuria   Protein-calorie malnutrition, severe   Severe malnutrition (HCC)   Discharge Condition: improved Disposition: home  Diet recommendation:  Diet Orders (From admission, onward)    Start     Ordered   11/24/20 0000  Diet - low sodium heart healthy        11/24/20 1305   11/21/20 1248  DIET DYS 2 Room service appropriate? Yes; Fluid consistency: Thin  Diet effective now       Comments: 2 gram sodium limit also please  Question Answer Comment  Room service appropriate? Yes   Fluid consistency: Thin      11/21/20 1248           Filed Weights   11/21/20 0630 11/22/20 0632  11/23/20 0603  Weight: 72.1 kg 65.9 kg 69.3 kg    HPI/Hospital Course:   70 year old man PMH combined systolic diastolic CHF, recent NSTEMI, nephrolithiasis presented for shortness of breath, edema of the abdomen and hematuria.  [name]  Admitted for acute on chronic systolic, diastolic CHF.  Responded to IV diuresis, condition gradually improved, seen by cardiology, started on Entresto.  Plan for discharge home with outpatient follow-up.  Acute on chronic combined systolic and diastolic CHF.  LVEF 20-25%, global hypokinesis, grade 3 diastolic dysfunction, mildly reduced right ventricular systolic function -- Acute issues resolved.  Appears euvolemic.  Discharge home with outpatient follow-up next week for lab work and follow-up with cardiology.  No salt.  Obtain scale and weigh self daily.  Discussed with patient and wife.  Demand ischemia --secondary to above, asymptomatic, no further evaluation  CAD, NSTEMI March 2022 OSH w/ d/c on ASA, atorvastatin, Keflex, BiDil, carvedilol.  No Lasix on discharge. -- Remained asymptomatic, will continue aspirin, Lipitor, carvedilol  CKD stage IIIb with normocytic anemia, suspect anemia of CKD --baseline creatinine in care everywhere 1.5 -- AKI secondary to diuresis.  Appears to be plateauing.  Anticipate spontaneous return to baseline.  Follow-up lab work next week.  Discussed with cardiology concurs with discharge.  Chronic combined systolic, diastolic CHF with echo LVEF less than 25%, severe global hypokinesis, RV function moderate to severely reduced, grade 1 diastolic dysfunction.  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 --asymptomatic, no pain or urinary symptoms, nonobstructing stone, will not treat bacteruria   Essential hypertension -- Remains stable  Hyperlipidemia  Consults:  . Cardiology   Today's assessment: S: CC: f/u SOB  Feels well,  breathing well.  O: Vitals:  Vitals:   11/24/20 0901 11/24/20 1209  BP: 124/87 104/64  Pulse: 72 76  Resp:  16  Temp:  (!) 97.5 F (36.4 C)  SpO2:  94%    Constitutional:  . Appears calm and comfortable Respiratory:  . CTA bilaterally, no w/r/r.  . Respiratory effort normal.  Cardiovascular:  . RRR, no m/r/g . No LE extremity edema   Psychiatric:  . Mental status o Mood, affect appropriate  Currently appears stable at 2.07 only very slightly elevated compared to yesterday.  Remainder BMP unremarkable.  Discharge Instructions  Discharge Instructions    (HEART FAILURE PATIENTS) Call MD:  Anytime you have any of the following symptoms: 1) 3 pound weight gain in 24 hours or 5 pounds in 1 week 2) shortness of breath, with or without a dry hacking cough 3) swelling in the hands, feet or stomach 4) if you have to sleep on extra pillows at night in order to breathe.   Complete by: As directed    Ambulatory referral to Cardiology   Complete by: As directed    Diet - low sodium heart healthy   Complete by: As directed    Discharge instructions   Complete by: As directed    Call your physician or seek immediate medical attention for swelling, pain, shortness of breath or worsening of condition.   Increase activity slowly   Complete by: As directed      Allergies as of 11/24/2020   No Known Allergies     Medication List    TAKE these medications   aspirin 81 MG EC tablet Take 1 tablet (81 mg total) by mouth daily. Swallow whole. Start taking on: November 25, 2020   atorvastatin 40 MG tablet Commonly known as: LIPITOR Take 1 tablet by mouth daily.   carvedilol 6.25 MG tablet Commonly known as: COREG Take 6.25 mg by mouth 2 (two) times daily.   multivitamin with minerals Tabs tablet Take 1 tablet by mouth daily. Start taking on: November 25, 2020   sacubitril-valsartan 24-26 MG Commonly known as: ENTRESTO Take 1 tablet by mouth 2 (two) times daily.      No Known  Allergies  The results of significant diagnostics from this hospitalization (including imaging, microbiology, ancillary and laboratory) are listed below for reference.    Significant Diagnostic Studies: US RENAL  Result Date: 11/21/2020 CLINICAL DATA:  Nephrolithiasis. EXAM: RENAL / URINARY TRACT ULTRASOUND COMPLETE COMPARISON:  None. FINDINGS: Right Kidney: Renal measurements: 10.7 x 4.4 x 4.8 cm = volume: 116.8 mL. Echogenicity within normal limits. 0.7 cm simple cyst. No hydronephrosis visualized. Left Kidney: Renal measurements: 10.3 x 4.8 x 5.6 cm = volume: 146.2 mL. Echogenicity within normal limits. No mass or hydronephrosis visualized. 1.3 cm nonobstructing calyceal stone. Bladder: Bladder is nondistended. Mild thickening of the bladder wall, possibly related to hypertrophy secondary to prostate enlargement. Bladder pathology cannot be excluded. Other: Prostate is enlarged at 4.4 x 3.6 x 5.9 cm. IMPRESSION: 1.  0.7 cm right renal simple cyst. 2.  1.3 cm nonobstructing left renal calyceal stone. 3. Prostate is enlarged. Mild thickening of the bladder wall, possibly related upper tree secondary to prostate enlargement. Bladder wall pathology cannot be excluded.  Electronically Signed   By: Maisie Fus  Register   On: 11/21/2020 06:28   DG Chest Portable 1 View  Result Date: 11/20/2020 CLINICAL DATA:  Shortness of breath. EXAM: PORTABLE CHEST 1 VIEW COMPARISON:  None. FINDINGS: Mild cardiomegaly. Normal aortic contours. There is mild bilateral hilar prominence. Mild peribronchial thickening. Subsegmental atelectasis in the left mid lung. No significant pleural effusion. No pneumothorax. No acute osseous abnormalities are seen. IMPRESSION: 1. Mild cardiomegaly. Mild peribronchial thickening of unknown chronicity. 2. Mild bilateral hilar prominence, likely prominent pulmonary arteries. Electronically Signed   By: Narda Rutherford M.D.   On: 11/20/2020 15:13   ECHOCARDIOGRAM COMPLETE  Result Date:  11/21/2020    ECHOCARDIOGRAM REPORT   Patient Name:   Joseph Tanner [facility] Date of Exam: 11/21/2020 Medical Rec #:  [id number]               Height:       75.0 in Accession #:    [id number]              Weight:       159.0 lb Date of Birth:  17-May-1951                BSA:          1.990 m Patient Age:    69 years                BP:           151/117 mmHg Patient Gender: M                       HR:           78 bpm. Exam Location:  Inpatient Procedure: 2D Echo, Cardiac Doppler and Color Doppler Indications:    CHF-Acute Systolic I50.21  History:        Patient has no prior history of Echocardiogram examinations.                 Previous Myocardial Infarction; Risk Factors:Dyslipidemia and                 Hypertension.  Sonographer:    Eulah Pont RDCS Referring Phys: [phone] DAVID GIRGUIS IMPRESSIONS  1. Left ventricular ejection fraction, by estimation, is 20 to 25%. The left ventricle has severely decreased function. The left ventricle demonstrates global hypokinesis. The left ventricular internal cavity size was moderately dilated. Left ventricular diastolic parameters are consistent with Grade Tanner diastolic dysfunction (restrictive). Elevated left atrial pressure.  2. Right ventricular systolic function is mildly reduced. The right ventricular size is mildly enlarged. There is normal pulmonary artery systolic pressure.  3. Left atrial size was moderately dilated.  4. Right atrial size was severely dilated.  5. The mitral valve is normal in structure. Trivial mitral valve regurgitation.  6. The aortic valve is tricuspid. Aortic valve regurgitation is trivial. No aortic stenosis is present.  7. The inferior vena cava is dilated in size with <50% respiratory variability, suggesting right atrial pressure of 15 mmHg. FINDINGS  Left Ventricle: Left ventricular ejection fraction, by estimation, is 20 to 25%. The left ventricle has severely decreased function. The left ventricle demonstrates global hypokinesis. The left  ventricular internal cavity size was moderately dilated. There is no left ventricular hypertrophy. Left ventricular diastolic parameters are consistent with Grade Tanner diastolic dysfunction (restrictive). Elevated left atrial pressure. Right Ventricle: The right ventricular size is mildly enlarged. No increase in right ventricular wall thickness. Right ventricular systolic  function is mildly reduced. There is normal pulmonary artery systolic pressure. The tricuspid regurgitant velocity  is 2.19 m/s, and with an assumed right atrial pressure of 15 mmHg, the estimated right ventricular systolic pressure is 34.2 mmHg. Left Atrium: Left atrial size was moderately dilated. Right Atrium: Right atrial size was severely dilated. Pericardium: There is no evidence of pericardial effusion. Mitral Valve: The mitral valve is normal in structure. Mild mitral annular calcification. Trivial mitral valve regurgitation. Tricuspid Valve: The tricuspid valve is normal in structure. Tricuspid valve regurgitation is trivial. Aortic Valve: The aortic valve is tricuspid. Aortic valve regurgitation is trivial. No aortic stenosis is present. Pulmonic Valve: The pulmonic valve was normal in structure. Pulmonic valve regurgitation is mild. Aorta: The aortic root is normal in size and structure. Venous: The inferior vena cava is dilated in size with less than 50% respiratory variability, suggesting right atrial pressure of 15 mmHg. IAS/Shunts: No atrial level shunt detected by color flow Doppler.  LEFT VENTRICLE PLAX 2D LVIDd:         6.40 cm      Diastology LVIDs:         5.63 cm      LV e' medial:    2.70 cm/s LV PW:         1.20 cm      LV E/e' medial:  36.5 LV IVS:        1.00 cm      LV e' lateral:   4.22 cm/s LVOT diam:     2.10 cm      LV E/e' lateral: 23.3 LV SV:         60 LV SV Index:   30 LVOT Area:     3.46 cm  LV Volumes (MOD) LV vol d, MOD A2C: 174.0 ml LV vol d, MOD A4C: 176.0 ml LV vol s, MOD A2C: 130.0 ml LV vol s, MOD A4C:  116.0 ml LV SV MOD A2C:     44.0 ml LV SV MOD A4C:     176.0 ml LV SV MOD BP:      53.5 ml RIGHT VENTRICLE TAPSE (M-mode): 1.6 cm LEFT ATRIUM             Index       RIGHT ATRIUM           Index LA diam:        4.70 cm 2.36 cm/m  RA Area:     21.70 cm LA Vol (A2C):   41.9 ml 21.05 ml/m RA Volume:   74.20 ml  37.28 ml/m LA Vol (A4C):   43.5 ml 21.86 ml/m LA Biplane Vol: 45.0 ml 22.61 ml/m  AORTIC VALVE LVOT Vmax:   105.60 cm/s LVOT Vmean:  68.500 cm/s LVOT VTI:    0.173 m  AORTA Ao Root diam: 3.50 cm Ao Asc diam:  3.70 cm MITRAL VALVE               TRICUSPID VALVE MV Area (PHT): 5.36 cm    TR Peak grad:   19.2 mmHg MV Decel Time: 142 msec    TR Vmax:        219.00 cm/s MV E velocity: 98.47 cm/s MV A velocity: 23.77 cm/s  SHUNTS MV E/A ratio:  4.14        Systemic VTI:  0.17 m  Systemic Diam: 2.10 cm Thurmon Fair MD Electronically signed by Thurmon Fair MD Signature Date/Time: 11/21/2020/1:09:52 PM    Final     Microbiology: Recent Results (from the past 240 hour(s))  Urine Culture     Status: Abnormal   Collection Time: 11/20/20  4:17 PM   Specimen: Urine, Random  Result Value Ref Range Status   Specimen Description   Final    URINE, RANDOM Performed at [facility], [address] [place], [place] [address]    Special Requests   Final    NONE Performed at [facility], [address], [place], [place] [address]    Culture >=100,000 COLONIES/mL [facility] MORGANII (A)  Final   Report Status 11/23/2020 FINAL  Final   Organism ID, Bacteria MORGANELLA MORGANII (A)  Final      Susceptibility   Morganella morganii - MIC*    AMPICILLIN >=32 RESISTANT Resistant     CEFAZOLIN >=64 RESISTANT Resistant     CIPROFLOXACIN <=0.25 SENSITIVE Sensitive     GENTAMICIN <=1 SENSITIVE Sensitive     IMIPENEM 2 SENSITIVE Sensitive     NITROFURANTOIN 128 RESISTANT Resistant     TRIMETH/SULFA <=20 SENSITIVE Sensitive      AMPICILLIN/SULBACTAM >=32 RESISTANT Resistant     PIP/TAZO <=4 SENSITIVE Sensitive     * >=100,000 COLONIES/mL MORGANELLA MORGANII  Resp Panel by RT-PCR (Flu A&B, Covid) Nasopharyngeal Swab     Status: None   Collection Time: 11/20/20  6:52 PM   Specimen: Nasopharyngeal Swab; Nasopharyngeal(NP) swabs in vial transport medium  Result Value Ref Range Status   SARS Coronavirus 2 by RT PCR NEGATIVE NEGATIVE Final    Comment: (NOTE) SARS-CoV-2 target nucleic acids are NOT DETECTED.  The SARS-CoV-2 RNA is generally detectable in upper respiratory specimens during the acute phase of infection. The lowest concentration of SARS-CoV-2 viral copies this assay can detect is 138 copies/mL. A negative result does not preclude SARS-Cov-2 infection and should not be used as the sole basis for treatment or other patient management decisions. A negative result may occur with  improper specimen collection/handling, submission of specimen other than nasopharyngeal swab, presence of viral mutation(s) within the areas targeted by this assay, and inadequate number of viral copies(<138 copies/mL). A negative result must be combined with clinical observations, patient history, and epidemiological information. The expected result is Negative.  Fact Sheet for Patients:  BloggerCourse.com  Fact Sheet for Healthcare Providers:  SeriousBroker.it  This test is no t yet approved or cleared by the [place] FDA and  has been authorized for detection and/or diagnosis of SARS-CoV-2 by FDA under an Emergency Use Authorization (EUA). This EUA will remain  in effect (meaning this test can be used) for the duration of the COVID-19 declaration under Section 564(b)(1) of the Act, 21 U.S.C.section 360bbb-3(b)(1), unless the authorization is terminated  or revoked sooner.       Influenza A by PCR NEGATIVE NEGATIVE Final   Influenza B by PCR NEGATIVE NEGATIVE Final     Comment: (NOTE) The Xpert Xpress SARS-CoV-2/FLU/RSV plus assay is intended as an aid in the diagnosis of influenza from Nasopharyngeal swab specimens and should not be used as a sole basis for treatment. Nasal washings and aspirates are unacceptable for Xpert Xpress SARS-CoV-2/FLU/RSV testing.  Fact Sheet for Patients: BloggerCourse.com  Fact Sheet for Healthcare Providers: SeriousBroker.it  This test is not yet approved or cleared by the [place] FDA and has been authorized for detection and/or diagnosis of SARS-CoV-2  by FDA under an Emergency Use Authorization (EUA). This EUA will remain in effect (meaning this test can be used) for the duration of the COVID-19 declaration under Section 564(b)(1) of the Act, 21 U.S.C. section 360bbb-3(b)(1), unless the authorization is terminated or revoked.  Performed at [facility], [address], [place], [place] [address]      Labs: Basic Metabolic Panel: Recent Labs  Lab 11/20/20 1416 11/21/20 0837 11/22/20 0331 11/23/20 0938 11/24/20 0603  NA 143 139 138 136 135  K 3.9 3.8 3.6 3.3* 3.6  CL 112* 105 101 96* 98  CO2 24 23 26 28 27   GLUCOSE 150* 105* 100* 133* 104*  BUN 19 21 25* 32* 38*  CREATININE 1.48* 1.66* 1.75* 1.99* 2.07*  CALCIUM 8.6* 9.0 8.8* 9.0 8.9   Liver Function Tests: Recent Labs  Lab 11/20/20 1416  AST 28  ALT 18  ALKPHOS 49  BILITOT 1.2  PROT 7.4  ALBUMIN 3.2*   No results for input(s): LIPASE, AMYLASE in the last 168 hours. No results for input(s): AMMONIA in the last 168 hours. CBC: Recent Labs  Lab 11/20/20 1416 11/21/20 0837 11/22/20 0331 11/23/20 0938  WBC 4.2 4.8 4.0 3.6*  NEUTROABS 2.5  --   --   --   HGB 10.2* 10.8* 10.3* 11.9*  HCT 32.7* 34.5* 32.8* 38.3*  MCV 92.9 93.0 91.6 91.8  PLT 96* 92* 105* 130*   Cardiac Enzymes: No results for input(s): CKTOTAL, CKMB, CKMBINDEX, TROPONINI in the last 168  hours. BNP: BNP (last 3 results) Recent Labs    11/20/20 1416  BNP >4,500.0*    ProBNP (last 3 results) No results for input(s): PROBNP in the last 8760 hours.  CBG: No results for input(s): GLUCAP in the last 168 hours.  Principal Problem:   Acute on chronic combined systolic and diastolic CHF (congestive heart failure) (HCC) Active Problems:   Demand ischemia (HCC)   Coronary artery disease   CKD (chronic kidney disease) stage 3, GFR 30-59 ml/min (HCC)   Nephrolithiasis   Pyuria   Protein-calorie malnutrition, severe   Severe malnutrition (HCC)   Time coordinating discharge: 35 minutes  Signed:  11/22/20, MD  [facility]  11/24/2020, 5:13 PM

## 2020-11-24 NOTE — Plan of Care (Cosign Needed)
May5 New Patient Visit with [name], MD Thursday Nov 30, 2020 8:40 AM  [facility] [address]  [place] [address] [phone]   Follow up with [name], MD Thursday Nov 30, 2020 Specialty: Cardiology 9:40 [facility] [address]  [place] [address] [phone]   Reviewed AVS in detail with pt. Meds, appointments & Heart Healthy diet/lifestyle reviewed in detail & added to AVS w/ highlighted areas. All questions & concerns addressed. IV removed. Pt d/c with daughter. I called pt & pt daughter @ 18:40 & notified them of all details on ABOVE APPOINTMENT (added after d/c).  All belongings with pt.

## 2020-11-24 NOTE — Progress Notes (Signed)
Progress Note  Patient Name: Joseph Tanner Date of Encounter: 11/24/2020  [facility] Cardiologist: Chrystie Nose, MD   Subjective   Slight increase in creatinine today - probably at end-diuresis. Will hold further lasix. Start low dose Entresto today.   Inpatient Medications    Scheduled Meds: . aspirin EC  81 mg Oral Daily  . atorvastatin  40 mg Oral Daily  . carvedilol  6.25 mg Oral BID WC  . enoxaparin (LOVENOX) injection  40 mg Subcutaneous Q24H  . lactose free nutrition  237 mL Oral TID WC  . Living Better with Heart Failure Book   Does not apply Once  . multivitamin with minerals  1 tablet Oral Daily  . sacubitril-valsartan  1 tablet Oral BID  . sodium chloride flush  3 mL Intravenous Q12H   Continuous Infusions: . sodium chloride     PRN Meds: sodium chloride, acetaminophen, hydrALAZINE, labetalol, ondansetron (ZOFRAN) IV, sodium chloride flush   Vital Signs    Vitals:   11/23/20 1504 11/23/20 2040 11/24/20 0632 11/24/20 0901  BP: 107/74 105/73 108/78 124/87  Pulse: 75 79 66 72  Resp: 20 20 16    Temp: 97.8 F (36.6 C) 98.2 F (36.8 C) 97.8 F (36.6 C)   TempSrc: Oral Oral Oral   SpO2: 97% 100% 99%   Weight:      Height:        Intake/Output Summary (Last 24 hours) at 11/24/2020 1156 Last data filed at 11/24/2020 11/26/2020 Gross per 24 hour  Intake 1200 ml  Output --  Net 1200 ml   Last 3 Weights 11/23/2020 11/22/2020 11/21/2020  Weight (lbs) 152 lb 12.5 oz 145 lb 4.5 oz 158 lb 15.2 oz  Weight (kg) 69.3 kg 65.9 kg 72.1 kg      Telemetry    Sinus 70-80s, PACs, PVCs - Personally Reviewed  ECG    No new tracings - Personally Reviewed  Physical Exam   GEN: No acute distress.   Neck: No JVD Cardiac: RRR, no murmurs, rubs, or gallops.  Respiratory: Clear to auscultation bilaterally. GI: Soft, nontender, non-distended  MS: no edema; No deformity. Neuro:  Nonfocal  Psych: Normal affect   Labs    High Sensitivity Troponin:    Recent Labs  Lab 11/20/20 1416 11/20/20 1718  TROPONINIHS 47* 54*      Chemistry Recent Labs  Lab 11/20/20 1416 11/21/20 0837 11/22/20 0331 11/23/20 0938 11/24/20 0603  NA 143   < > 138 136 135  K 3.9   < > 3.6 3.3* 3.6  CL 112*   < > 101 96* 98  CO2 24   < > 26 28 27   GLUCOSE 150*   < > 100* 133* 104*  BUN 19   < > 25* 32* 38*  CREATININE 1.48*   < > 1.75* 1.99* 2.07*  CALCIUM 8.6*   < > 8.8* 9.0 8.9  PROT 7.4  --   --   --   --   ALBUMIN 3.2*  --   --   --   --   AST 28  --   --   --   --   ALT 18  --   --   --   --   ALKPHOS 49  --   --   --   --   BILITOT 1.2  --   --   --   --   GFRNONAA 51*   < > 42* 36* 34*  ANIONGAP 7   < > 11 12 10    < > = values in this interval not displayed.     Hematology Recent Labs  Lab 11/21/20 0837 11/22/20 0331 11/23/20 0938  WBC 4.8 4.0 3.6*  RBC 3.71* 3.58* 4.17*  HGB 10.8* 10.3* 11.9*  HCT 34.5* 32.8* 38.3*  MCV 93.0 91.6 91.8  MCH 29.1 28.8 28.5  MCHC 31.3 31.4 31.1  RDW 15.4 15.2 15.1  PLT 92* 105* 130*    BNP Recent Labs  Lab 11/20/20 1416  BNP >4,500.0*     DDimer No results for input(s): DDIMER in the last 168 hours.   Radiology    No results found.  Cardiac Studies   Echo 11/21/20: 1. Left ventricular ejection fraction, by estimation, is 20 to 25%. The  left ventricle has severely decreased function. The left ventricle  demonstrates global hypokinesis. The left ventricular internal cavity size  was moderately dilated. Left  ventricular diastolic parameters are consistent with Grade III diastolic  dysfunction (restrictive). Elevated left atrial pressure.  2. Right ventricular systolic function is mildly reduced. The right  ventricular size is mildly enlarged. There is normal pulmonary artery  systolic pressure.  3. Left atrial size was moderately dilated.  4. Right atrial size was severely dilated.  5. The mitral valve is normal in structure. Trivial mitral valve  regurgitation.  6. The  aortic valve is tricuspid. Aortic valve regurgitation is trivial.  No aortic stenosis is present.  7. The inferior vena cava is dilated in size with <50% respiratory  variability, suggesting right atrial pressure of 15 mmHg.   Patient Profile     70 y.o. male with history of CAD/NSTEMI, combined systolic and diastolic HF, HTN, HLD, presents with worsening dyspnea.  Assessment & Plan    Acute on chronic systolic and diastolic heart failure - echo this admission with EF 20-25%, grade III DD, mildly reduced RV function, severely dilated right atrium, moderately dilated left atrium - BNP > 4500 - EF declined since March 2022 - stop lasix, small increase in creatinine - at end-diuresis. - will use low dose Entresto 24/26 mg BID  CAD - heart cath in March 2022 in April 2022 showed nonobstructive disease in the RCA and severe stenosis in a small Cx - no inervention - continue ASA - hs troponin mild and flat - no chest pain  Acute on chronic renal insufficiency III - sCr is now 2.07 - D/C'ed lasix - will transition to [facility] without lasix  Hypokalemia - PVCs and PACs on telemetry - K 3.6 today  Alcohol use - pt reports significant social drinking - advised reduction on intake given cardiomyopathy  Hyperlipidemia with LDL goal < 70 11/23/2020: Cholesterol 123; HDL 39; LDL Cholesterol 56; Triglycerides 138; VLDL 28 - continue 40 mg lipitor     For questions or updates, please contact [facility] Please consult [email or web address] for contact info under   11/25/2020, MD, Chrystie Nose  [place]  [facility]  Medical Director of the [facility]  [facility] Diplomate of the American Board of Clinical Lipidology Attending Cardiologist  Direct Dial: [phone]  Fax: [phone]  Website:  [name]  [phone], MD  11/24/2020, 11:56 AM

## 2020-11-24 NOTE — Discharge Instructions (Signed)
Heart Failure, Diagnosis  Heart failure means that your heart is not able to pump blood in the right way. This makes it hard for your body to work well. Heart failure is usually a long-term (chronic) condition. You must take good care of yourself and follow your treatment plan from your doctor. What are the causes?  High blood pressure.  Buildup of cholesterol and fat in the arteries.  Heart attack. This injures the heart muscle.  Heart valves that do not open and close properly.  Damage of the heart muscle. This is also called cardiomyopathy.  Infection of the heart muscle. This is also called myocarditis.  Lung disease. What increases the risk?  Getting older. The risk of heart failure goes up as a person ages.  Being overweight.  Being male.  Use tobacco or nicotine products.  Abusing alcohol or drugs.  Having taken medicines that can damage the heart.  Having any of these conditions: ? Diabetes. ? Abnormal heart rhythms. ? Thyroid problems. ? Low blood counts (anemia).  Having a family history of heart failure. What are the signs or symptoms?  Shortness of breath.  Coughing.  Swelling of the feet, ankles, legs, or belly.  Losing or gaining weight for no reason.  Trouble breathing.  Waking from sleep because of the need to sit up and get more air.  Fast heartbeat.  Being very tired.  Feeling dizzy, or feeling like you may pass out (faint).  Having no desire to eat.  Feeling like you may vomit (nauseous).  Peeing (urinating) more at night.  Feeling confused. How is this treated? This condition may be treated with:  Medicines. These can be given to treat blood pressure and to make the heart muscles stronger.  Changes in your daily life. These may include: ? Eating a healthy diet. ? Staying at a healthy body weight. ? Quitting tobacco, alcohol, and drug use. ? Doing exercises. ? Participating in a cardiac rehabilitation program. This  program helps you improve your health through exercise, education, and counseling.  Surgery. Surgery can be done to open blocked valves, or to put devices in the heart, such as pacemakers.  A donor heart (heart transplant). You will receive a healthy heart from a donor. Follow these instructions at home:  Treat other conditions as told by your doctor. These may include high blood pressure, diabetes, thyroid disease, or abnormal heart rhythms.  Learn as much as you can about heart failure.  Get support as you need it.  Keep all follow-up visits. Summary  Heart failure means that your heart is not able to pump blood in the right way.  This condition is often caused by high blood pressure, heart attack, or damage of the heart muscle.  Symptoms of this condition include shortness of breath and swelling of the feet, ankles, legs, or belly. You may also feel very tired or feel like you may vomit.  You may be treated with medicines, surgery, or changes in your daily life.  Treat other health conditions as told by your doctor. This information is not intended to replace advice given to you by your health care provider. Make sure you discuss any questions you have with your health care provider. Document Revised: 02/05/2020 Document Reviewed: 02/05/2020 Elsevier Patient Education  2021 Elsevier Inc.   Heart Failure, Self-Care Heart failure is a serious condition. The following information explains things you need to do to take care of yourself at home. To help you stay as healthy  as possible, you may be asked to change your diet, take certain medicines, and make other changes in your life. Your doctor may also give you more specific instructions. If you have problems or questions, call your doctor. What are the risks? Having heart failure makes it more likely for you to have some problems. These problems can get worse if you do not take good care of yourself. Problems may include:  Damage  to the kidneys, liver, or lungs.  Malnutrition.  Abnormal heart rhythms.  Blood clotting problems that could cause a stroke. Supplies needed:  Scale for weighing yourself.  Blood pressure monitor.  Notebook.  Medicines. How to care for yourself when you have heart failure Medicines Take over-the-counter and prescription medicines only as told by your doctor. Take your medicines every day.  Do not stop taking your medicine unless your doctor tells you to do so.  Do not skip any medicines.  Get your prescriptions refilled before you run out of medicine. This is important.  Talk with your doctor if you cannot afford your medicines. Eating and drinking  Eat heart-healthy foods. Talk with a diet specialist (dietitian) to create an eating plan.  Limit salt (sodium) if told by your doctor. Ask your diet specialist to tell you which seasonings are healthy for your heart.  Cook in healthy ways instead of frying. Healthy ways of cooking include roasting, grilling, broiling, baking, poaching, steaming, and stir-frying.  Choose foods that: ? Have no trans fat. ? Are low in saturated fat and cholesterol.  Choose healthy foods, such as: ? Fresh or frozen fruits and vegetables. ? Fish. ? Low-fat (lean) meats. ? Legumes, such as beans, peas, and lentils. ? Fat-free or low-fat dairy products. ? Whole-grain foods. ? High-fiber foods.  Limit how much fluid you drink, if told by your doctor.   Alcohol use  Do not drink alcohol if: ? Your doctor tells you not to drink. ? Your heart was damaged by alcohol, or you have very bad heart failure. ? You are pregnant, may be pregnant, or are planning to become pregnant.  If you drink alcohol: ? Limit how much you have to:  0-1 drink a day for women.  0-2 drinks a day for men. ? Know how much alcohol is in your drink. In the [place] one drink equals one 12 oz bottle of beer (355 mL), one 5 oz glass of wine (148 mL), or one 1 oz glass  of hard liquor (44 mL). Lifestyle  Do not smoke or use any products that contain nicotine or tobacco. If you need help quitting, ask your doctor. ? Do not use nicotine gum or patches before talking to your doctor.  Do not use illegal drugs.  Lose weight if told by your doctor.  Do physical activity if told by your doctor. Talk to your doctor before you begin an exercise if: ? You are an older adult. ? You have very bad heart failure.  Learn to manage stress. If you need help, ask your doctor.  Get physical rehab (rehabilitation) to help you stay independent and to help with your quality of life.  Participate in a cardiac rehab program. This program helps you improve your health through exercise, education, and counseling.  Plan time to rest when you get tired.   Check weight and blood pressure  Weigh yourself every day. This will help you to know if fluid is building up in your body. ? Weigh yourself every morning after you pee (  urinate) and before you eat breakfast. ? Wear the same amount of clothing each time. ? Write down your daily weight. Give your record to your doctor.  Check and write down your blood pressure as told by your doctor.  Check your pulse as told by your doctor.   Dealing with very hot and very cold weather  If it is very hot: ? Avoid activities that take a lot of energy. ? Use air conditioning or fans, or find a cooler place. ? Avoid caffeine and alcohol. ? Wear clothing that is loose-fitting, lightweight, and light-colored.  If it is very cold: ? Avoid activities that take a lot of energy. ? Layer your clothes. ? Wear mittens or gloves, a hat, and a face covering when you go outside. ? Avoid alcohol. Follow these instructions at home:  Stay up to date with shots (vaccines). Get pneumococcal and flu (influenza) shots.  Keep all follow-up visits. Contact a doctor if:  You gain 2-3 lb (1-1.4 kg) in 24 hours or 5 lb (2.3 kg) in a week.  You have  increasing shortness of breath.  You cannot do your normal activities.  You get tired easily.  You cough a lot.  You do not feel like eating or feel like you may vomit (nauseous).  You have swelling in your hands, feet, ankles, or belly (abdomen).  You cannot sleep well because it is hard to breathe.  You feel like your heart is beating fast (palpitations).  You get dizzy when you stand up.  You feel depressed or sad. Get help right away if:  You have trouble breathing.  You or someone else notices a change in your behavior, such as having trouble staying awake.  You have chest pain or discomfort.  You pass out (faint). These symptoms may be an emergency. Get help right away. Call your local emergency services (911 in the [place]  Do not wait to see if the symptoms will go away.  Do not drive yourself to the hospital. Summary  Heart failure is a serious condition. To care for yourself, you may have to change your diet, take medicines, and make other lifestyle changes.  Take your medicines every day. Do not stop taking them unless your doctor tells you to do so.  Limit salt and eat heart-healthy foods.  Ask your doctor if you can drink alcohol. You may have to stop alcohol use if you have very bad heart failure.  Contact your doctor if you gain weight quickly or feel that your heart is beating too fast. Get help right away if you pass out or have chest pain or trouble breathing. This information is not intended to replace advice given to you by your health care provider. Make sure you discuss any questions you have with your health care provider. Document Revised: 02/05/2020 Document Reviewed: 02/05/2020 Elsevier Patient Education  2021 Elsevier Inc. Men's Multivitamin Brands: Centrum Silver or NatureMade 50+ (be sure to buy vitamins for MEN over 52 years old)  Nutrition Post Hospital Stay Proper nutrition can help your body recover from illness and injury.   Foods  and beverages high in protein, vitamins, and minerals help rebuild muscle loss, promote healing, & reduce fall risk.   .In addition to eating healthy foods, a nutrition shake is an easy, delicious way to get the nutrition you need during and after your hospital stay  It is recommended that you continue to drink 3 bottles per day of: Boost Plus for at least  1 month (30 days) after your hospital stay   Tips for adding a nutrition shake into your routine: As allowed, drink one with vitamins or medications instead of water or juice Enjoy one as a tasty mid-morning or afternoon snack Drink cold or make a milkshake out of it Drink one instead of milk with cereal or snacks Use as a coffee creamer   Available at the following grocery stores and pharmacies:           * Karin Golden * [employer] * [employer]co  * Rite Aid          * Walmart * [employer]  * Walgreens      * Target  * BJ's   * CVS  * [employer]   * [facility] [phone]            For COUPONS visit: www.ensure.com/join or RoleLink.com.br   Suggested Substitutions Ensure Plus = Boost Plus = Carnation Breakfast Essentials = Boost Compact Ensure Active Clear = Boost Breeze Glucerna Shake = Boost Glucose Control = Carnation Breakfast Essentials SUGAR FREE  Suggestions For Increasing Calories And Protein . Several small meals a day are easier to eat and digest than three large ones. Space meals about 2 to 3 hours apart to maximize comfort. . Stop eating 2 to 3 hours before bed and sleep with your head elevated if gastric reflux (GERD) and heartburn are problems. . Do not eat your favorite foods if you are feeling bad. Save them for when you feel good! . Eat breakfast-type foods at any meal. Eggs are usually easy to eat and are great any time of the day. (The same goes for pancakes and waffles.) . Eat when you feel hungry. Most people have the greatest appetite in the morning because they have not eaten  all night. If this is the best meal for you, then pile on those calories and other nutrients in the morning and at lunch. Then you can have a smaller dinner without losing total calories for the day. . Eat leftovers or nutritious snacks in the afternoon and early evening to round out your day. . Try homemade or commercially prepared nutrition bars and puddings, as well as calorie- and protein-rich liquid nutritional supplements. Benefits of Physical Activity Talk to your doctor about physical activity. Light or moderate physical activity can help maintain muscle and promote an appetite. Walking in the neighborhood or the local mall is a great way to get up, get out, and get moving. If you are unsteady on your feet, try walking around the dining room table. Save Room for [employer]! Drink most fluids between meals instead of with meals. (It is fine to have a sip to help swallow food at meal time.) Fluids (which usually have fewer calories and nutrients than solid food) can take up valuable space in your stomach.  Foods Recommended High-Protein Foods Milk products Add cheese to toast, crackers, sandwiches, baked potatoes, vegetables, soups, noodles, meat, and fruit. Use reduced-fat (2%) or whole milk in place of water when cooking cereal and cream soups. Include cream sauces on vegetables and pasta. Add powdered milk to cream soups and mashed potatoes.  Eggs Have hard-cooked eggs readily available in the refrigerator. Chop and add to salads, casseroles, soups, and vegetables. Make a quick egg salad. All eggs should be well cooked to avoid the risk of harmful bacteria.  Meats, poultry, and fish Add leftover cooked meats to soups, casseroles, salads, and omelets. Make  dip by mixing diced, chopped, or shredded meat with sour cream and spices.  Beans, legumes, nuts, and seeds Sprinkle nuts and seeds on cereals, fruit, and desserts such as ice cream, pudding, and custard. Also serve nuts and seeds on  vegetables, salads, and pasta. Spread peanut butter on toast, bread, English muffins, and fruit, or blend it in a milk shake. Add beans and peas to salads, soups, casseroles, and vegetable dishes.  High-Calorie Foods Butter, margarine, and  oils Melt butter or margarine over potatoes, rice, pasta, and cooked vegetables. Add melted butter or margarine into soups and casseroles and spread on bread for sandwiches before spreading sandwich spread or peanut butter. Saut or stir-fry vegetables, meats, chicken and fish such as shrimp/scallops in olive or canola oil. A variety of oils add calories and can be used to TransMontaignemarinate meat, chicken, or fish.  Milk products Add whipping cream to desserts, pancakes, waffles, fruit, and hot chocolate, and fold it into soups and casseroles. Add sour cream to baked potatoes and vegetables.  Salad dressing Use regular (not low-fat or diet) mayonnaise and salad dressing on sandwiches and in dips with vegetables and fruit.   Sweets Add jelly and honey to bread and crackers. Add jam to fruit and ice cream and as a topping over cake.   Copyright 2020  Academy of Nutrition and Dietetics. All rights reserved.

## 2020-11-30 ENCOUNTER — Other Ambulatory Visit: Payer: Self-pay

## 2020-11-30 ENCOUNTER — Ambulatory Visit: Payer: Medicare HMO | Admitting: Cardiovascular Disease

## 2020-11-30 ENCOUNTER — Encounter: Payer: Self-pay | Admitting: Cardiovascular Disease

## 2020-11-30 VITALS — BP 132/84 | HR 73 | Ht 75.0 in | Wt 162.4 lb

## 2020-11-30 DIAGNOSIS — Z5181 Encounter for therapeutic drug level monitoring: Secondary | ICD-10-CM | POA: Diagnosis not present

## 2020-11-30 DIAGNOSIS — R300 Dysuria: Secondary | ICD-10-CM | POA: Diagnosis not present

## 2020-11-30 DIAGNOSIS — R9430 Abnormal result of cardiovascular function study, unspecified: Secondary | ICD-10-CM

## 2020-11-30 NOTE — Patient Instructions (Signed)
Medication Instructions:  Your physician recommends that you continue on your current medications as directed. Please refer to the Current Medication list given to you today.   *If you need a refill on your cardiac medications before your next appointment, please call your pharmacy*  Lab Work: BMET/URINLAYSIS TODAY   If you have labs (blood work) drawn today and your tests are completely normal, you will receive your results only by: Marland Kitchen MyChart Message (if you have MyChart) OR . A paper copy in the mail If you have any lab test that is abnormal or we need to change your treatment, we will call you to review the results.  Testing/Procedures: NONE  Follow-Up: At [facility], you and your health needs are our priority.  As part of our continuing mission to provide you with exceptional heart care, we have created designated Provider Care Teams.  These Care Teams include your primary Cardiologist (physician) and Advanced Practice Providers (APPs -  Physician Assistants and Nurse Practitioners) who all work together to provide you with the care you need, when you need it.  We recommend signing up for the patient portal called "MyChart".  Sign up information is provided on this After Visit Summary.  MyChart is used to connect with patients for Virtual Visits (Telemedicine).  Patients are able to view lab/test results, encounter notes, upcoming appointments, etc.  Non-urgent messages can be sent to your provider as well.   To learn more about what you can do with MyChart, go to [email or web address].    Your next appointment:   2 month(s)  The format for your next appointment:   In Person  Provider:   DR Bergen OR APP AT [facility] LOC[facility]   You have been referred to ELECTROPHYSIOLOGIST AT [address]

## 2020-11-30 NOTE — Progress Notes (Signed)
Cardiology Office Note   Date:  11/30/2020   ID:  Joseph Tanner, DOB December 29, 1950, MRN [id number]  PCP:  Pcp, No  Cardiologist:   [name], MD   No chief complaint on file.    History of Present Illness: Joseph Tanner is a 70 y.o. male with chronic systolic and diastolic heart failure, CKD 3, CAD status post NSTEMI with PCI, hypertension, hyperlipidemia, and alcohol abuse who presents for follow-up.  Joseph Tanner, recently moved to [place] from [place].  Joseph Tanner has a history of an NSTEMI and chronic systolic diastolic heart failure. This occurred around 2019.   Joseph Tanner Joseph Tanner initially did not have a heart cath.  Prior to leaving [place] Joseph Tanner was hospitalized 09/2020 with recurrent NSTEMI and heart failure symptoms.  Had a left heart catheterization that revealed severe stenosis of a small left circumflex and mild to moderate stenosis in Joseph RCA which was large and heavily calcified.  His LVEF was 25% in [place].  Joseph Tanner reports that it has been this level for many years and that Joseph Tanner has been offered an ICD but declined.  Joseph Tanner presented to [facility] 11/20/2020 with acute on chronic systolic and diastolic heart failure.  BNP was 4500.  High-sensitivity troponin was mildly elevated to 54 and relatively flat.  Joseph Tanner was diuresed and switched from BiDil to Ashley.  Joseph Tanner was discharged on 4/29.  His discharge weight is unclear.  On Joseph day prior to discharge she was 69.3 kg.  Since being discharged she has been feeling well.  Joseph Tanner has no recurrent shortness of breath.  Joseph Tanner denies lower extremity edema, orthopnea, or PND.  During his hospitalization Joseph Tanner was also noted to have a kidney stone and is scheduled to see urology later this month.  Joseph Tanner has been struggling with pain in his backs and wonders if it is coming from his kidneys.  Joseph Tanner has no hematuria or dysuria.  Joseph pain is constant and not necessarily with urination.  Joseph Tanner typically only drinks on Joseph weekends.  In Joseph past Joseph Tanner would drink up  to 1/5 of liquor and 1 day and then sleep most of Joseph next day.  Has not had any alcohol in Joseph last 3 months.  Joseph Tanner is cut back on his smoking and now only smokes about 4 cigars a day.  Joseph Tanner has family in [employer] and is working to secure stable housing.  Joseph Tanner has no difficulty affording foods.    Past Medical History:  Diagnosis Date  . Hyperlipemia   . Hypertension   . NSTEMI (non-ST elevated myocardial infarction) (HCC)     No past surgical history on file.   Current Outpatient Medications  Medication Sig Dispense Refill  . aspirin EC 81 MG EC tablet Take 1 tablet (81 mg total) by mouth daily. Swallow whole.    Marland Kitchen atorvastatin (LIPITOR) 40 MG tablet Take 1 tablet by mouth daily.    . carvedilol (COREG) 6.25 MG tablet Take 6.25 mg by mouth 2 (two) times daily.    . Multiple Vitamin (MULTIVITAMIN WITH MINERALS) TABS tablet Take 1 tablet by mouth daily.    . sacubitril-valsartan (ENTRESTO) 24-26 MG Take 1 tablet by mouth 2 (two) times daily. 60 tablet 2   No current facility-administered medications for this visit.    Allergies:   Tanner has no known allergies.    Social History:  Joseph Tanner  reports that Joseph Tanner has been smoking. Joseph Tanner has never used smokeless tobacco. Joseph Tanner reports current alcohol  use. Joseph Tanner reports that Joseph Tanner does not use drugs.   Family History:  Joseph Tanner's family history includes Heart failure in his maternal grandfather, maternal grandmother, mother, paternal grandfather, and paternal grandmother.    ROS:  Please see Joseph history of present illness.   Otherwise, review of systems are positive for none.   All other systems are reviewed and negative.    PHYSICAL EXAM: VS:  BP 132/84   Pulse 73   Ht 6\' 3"  (1.905 m)   Wt 162 lb 6.4 oz (73.7 kg)   SpO2 100%   BMI 20.30 kg/m  , BMI Body mass index is 20.3 kg/m. GENERAL:  Well appearing HEENT:  Pupils equal round and reactive, fundi not visualized, oral mucosa unremarkable NECK:  No jugular venous distention, waveform  within normal limits, carotid upstroke brisk and symmetric, no bruits, no thyromegaly LYMPHATICS:  No cervical adenopathy LUNGS:  Clear to auscultation bilaterally HEART:  RRR.  PMI not displaced or sustained,S1 and S2 within normal limits, no S3, no S4, no clicks, no rubs, no murmurs ABD:  Flat, positive bowel sounds normal in frequency in pitch, no bruits, no rebound, no guarding, no midline pulsatile mass, no hepatomegaly, no splenomegaly EXT:  2 plus pulses throughout, no edema, no cyanosis + clubbing SKIN:  No rashes no nodules NEURO:  Cranial nerves II through XII grossly intact, motor grossly intact throughout PSYCH:  Cognitively intact, oriented to person place and time   EKG:  EKG is not ordered today.  Left heart cath 10/17/2020:  Left ventricular dysfunction out of proportion to his coronary artery  disease   Severe stenosis of a small left circumflex, mild-to-moderate stenosis of  a large RCA   Elevated left ventricular end-diastolic filling pressure   Right radial loop   Successful Perclose closure of Joseph right femoral artery   Nuclear stress 10/15/20: 10/17/20 Moderately severe fixed defect of Joseph apex and distal lateral wall. This is associated with akinesia, and this is consistent with a myocardial scar from prior infarct.   2. No reversible defects identified that would be suspect for myocardium in jeopardy for ischemia.   3. Left ventricular chamber is dilated at rest and stress.   4. Hypokinesia of Joseph septum and inferior wall.   5. LVEF is markedly reduced, and determined to be 27%.   Echo 10/14/20; Summary Statements  Joseph left ventricle is mildly dilated.  There is mild concentric left ventricular hypertrophy.  Left ventricular systolic function is severely reduced.  Ejection Fraction = <25%.  There is severe global hypokinesis of Joseph left ventricle.  Joseph right ventricle is grossly normal size.  Joseph right ventricular systolic function is moderate to severely  reduced.  Joseph left atrium is mildly dilated.  Borderline right atrial enlargement.  There is no pericardial effusion.  Grade I diastolic dysfunction.   Recent Labs: 11/20/2020: ALT 18; B Natriuretic Peptide >4,500.0 11/23/2020: Hemoglobin 11.9; Platelets 130 11/24/2020: BUN 38; Creatinine, Ser 2.07; Potassium 3.6; Sodium 135    Lipid Panel    Component Value Date/Time   CHOL 123 11/23/2020 0344   TRIG 138 11/23/2020 0344   HDL 39 (L) 11/23/2020 0344   CHOLHDL 3.2 11/23/2020 0344   VLDL 28 11/23/2020 0344   LDLCALC 56 11/23/2020 0344      Wt Readings from Last 3 Encounters:  11/30/20 162 lb 6.4 oz (73.7 kg)  11/23/20 152 lb 12.5 oz (69.3 kg)      ASSESSMENT AND PLAN:  # Chronic systolic and  diastolic heart failure: #Essential hypertension: Chronic systolic heart failure.  LVEF 25%.  This has been ongoing for many years despite being on good medical therapy.  Likely multifactorial with a component of ischemic cardiomyopathy though likely mostly due to alcohol.  Joseph Tanner also has an extensive family history of heart failure which may also be contributing.  Joseph Tanner is currently doing well and euvolemic.  His creatinine was significantly elevated in Joseph hospital after starting [facility].  We will repeat a basic metabolic panel today.  If his renal function is not improving, we may need to switch him back to BiDil.  Continue carvedilol.  Pending his renal function we will try to add on spironolactone if possible.  We did have a long discussion about ICD and Joseph Tanner is now amenable to having one implanted.  We will refer him to EP.  Ideally Joseph Tanner should also be on an SGLT2 inhibitor.  Given that Joseph Tanner is having urinary symptoms this is not a good time.  We will be sure to start this after Joseph Tanner is seen by urology.  Blood pressure was above goal both initially and on repeat.  We will determine what to add to his regimen based on his basic metabolic panel from today.  We discussed Joseph importance of continue to abstain  from alcohol.  # CAD: Joseph Tanner has CAD that is medically managed.  Continue aspirin, atorvastatin, and carvedilol.  Joseph Tanner is asymptomatic.  # SDOH:   Tanner moved here from [place] and has been limited resources.  Joseph Tanner was given a scale and we will work on getting him a blood pressure cuff.  We will also ask our social work team to reach out to him to assist with housing options if possible.  # UTI: Tanner had a urinary tract infection which was treated in Joseph hospital for IV antibiotics.  Joseph Tanner notes that since this was discontinued his symptoms have recurred.  Check urinalysis today.  Current medicines are reviewed at length with Joseph Tanner today.  Joseph Tanner does not have concerns regarding medicines.  Joseph following changes have been made:  no change  Time spent: 45 minutes-Greater than 50% of this time was spent in counseling, explanation of diagnosis, planning of further management, and coordination of care.  Labs/ tests ordered today include:   Orders Placed This Encounter  Procedures  . Basic metabolic panel  . Urinalysis  . Ambulatory referral to Cardiac Electrophysiology     Disposition:   FU with Lewellyn Fultz C. Duke Salvia, MD, [facility] in 2 months     Signed, Anthonio Mizzell C. Duke Salvia, MD, [facility]  11/30/2020 11:53 AM    [facility]

## 2020-12-01 LAB — BASIC METABOLIC PANEL
BUN/Creatinine Ratio: 16 (ref 10–24)
BUN: 23 mg/dL (ref 8–27)
CO2: 21 mmol/L (ref 20–29)
Calcium: 9.3 mg/dL (ref 8.6–10.2)
Chloride: 106 mmol/L (ref 96–106)
Creatinine, Ser: 1.44 mg/dL — ABNORMAL HIGH (ref 0.76–1.27)
Glucose: 104 mg/dL — ABNORMAL HIGH (ref 65–99)
Potassium: 4.9 mmol/L (ref 3.5–5.2)
Sodium: 142 mmol/L (ref 134–144)
eGFR: 53 mL/min/{1.73_m2} — ABNORMAL LOW (ref >59)

## 2020-12-01 LAB — URINALYSIS
Bilirubin, UA: NEGATIVE
Glucose, UA: NEGATIVE
Ketones, UA: NEGATIVE
Nitrite, UA: NEGATIVE
RBC, UA: NEGATIVE
Specific Gravity, UA: 1.018 (ref 1.005–1.030)
Urobilinogen, Ur: 0.2 mg/dL (ref 0.2–1.0)
pH, UA: 6 (ref 5.0–7.5)

## 2020-12-04 ENCOUNTER — Telehealth: Payer: Self-pay | Admitting: *Deleted

## 2020-12-04 MED ORDER — AMOXICILLIN-POT CLAVULANATE 500-125 MG PO TABS: 1.0000 | ORAL_TABLET | Freq: Two times a day (BID) | ORAL | 0 refills | Status: DC

## 2020-12-04 NOTE — Telephone Encounter (Signed)
-----   Message from [name], MD sent at 12/04/2020 12:11 PM EDT ----- Urinalysis shows that his infection isn't completely clean yet.  Recommend Augmentin 500/125mg  q17h x7 days.  If he continues to have discomfort when it is completed, follow up with PCP.

## 2020-12-04 NOTE — Telephone Encounter (Signed)
Advised patient, verbalized understanding  Did verify with Joseph G PA that Augmentin Q12hr x 7 days

## 2020-12-18 ENCOUNTER — Institutional Professional Consult (permissible substitution): Payer: Medicare HMO | Admitting: Cardiology

## 2020-12-18 NOTE — Progress Notes (Deleted)
Electrophysiology Office Note   Date:  12/18/2020   ID:  Joseph Tanner, DOB 1951/01/07, MRN [id number]  PCP:  Pcp, No  Cardiologist:  *** Primary Electrophysiologist: *** [name], MD    Chief Complaint: ***   History of Present Illness: Joseph Tanner is a 70 y.o. male who is being seen today for the evaluation of *** at the request of Joseph Si, MD. Presenting today for electrophysiology evaluation.  He has a history of chronic systolic heart failure, CKD stage III, coronary artery disease status post non-STEMI, hypertension, hyperlipidemia, alcohol abuse.  Moved Joseph Tanner from [place].  He has a history of non-STEMI and heart failure.  Non-STEMI occurred around 2019.  Prior to leaving [place] he was hospitalized March 2022 with non-STEMI and heart failure symptoms.  Left heart catheterization showed severe stenosis of a small circumflex and moderate to severe stenosis in the RCA.  His ejection fraction was 25%.  He has been at this level for many years but he is declined ICD therapy.  He was admitted to [facility] 11/20/2020 with acute on chronic systolic and diastolic heart failure.  He was started on BiDil and Entresto.  Today, he denies*** symptoms of palpitations, chest pain, shortness of breath, orthopnea, PND, lower extremity edema, claudication, dizziness, presyncope, syncope, bleeding, or neurologic sequela. The patient is tolerating medications without difficulties.    Past Medical History:  Diagnosis Date  . Hyperlipemia   . Hypertension   . NSTEMI (non-ST elevated myocardial infarction) (HCC)    No past surgical history on file.   Current Outpatient Medications  Medication Sig Dispense Refill  . amoxicillin-clavulanate (AUGMENTIN) 500-125 MG tablet Take 1 tablet (500 mg total) by mouth every 12 (twelve) hours. 14 tablet 0  . aspirin EC 81 MG EC tablet Take 1 tablet (81 mg total) by mouth daily. Swallow whole.    Joseph Tanner  atorvastatin (LIPITOR) 40 MG tablet Take 1 tablet by mouth daily.    . carvedilol (COREG) 6.25 MG tablet Take 6.25 mg by mouth 2 (two) times daily.    . Multiple Vitamin (MULTIVITAMIN WITH MINERALS) TABS tablet Take 1 tablet by mouth daily.    . sacubitril-valsartan (ENTRESTO) 24-26 MG Take 1 tablet by mouth 2 (two) times daily. 60 tablet 2   No current facility-administered medications for this visit.    Allergies:   Patient has no known allergies.   Social History:  The patient  reports that he has been smoking. He has never used smokeless tobacco. He reports current alcohol use. He reports that he does not use drugs.   Family History:  The patient's family history includes Heart failure in his maternal grandfather, maternal grandmother, mother, paternal grandfather, and paternal grandmother.    ROS:  Please see the history of present illness.   Otherwise, review of systems is positive for none.   All other systems are reviewed and negative.    PHYSICAL EXAM: VS:  There were no vitals taken for this visit. , BMI There is no height or weight on file to calculate BMI. GEN: Well nourished, well developed, in no acute distress  HEENT: normal  Neck: no JVD, carotid bruits, or masses Cardiac: ***RRR; no murmurs, rubs, or gallops,no edema  Respiratory:  clear to auscultation bilaterally, normal work of breathing GI: soft, nontender, nondistended, + BS MS: no deformity or atrophy  Skin: warm and dry Neuro:  Strength and sensation are intact Psych: euthymic mood, full affect  EKG:  EKG {  ACTION; IS/IS [id number]:[id number]} ordered today. Personal review of the ekg ordered *** shows ***  Recent Labs: 11/20/2020: ALT 18; B Natriuretic Peptide >4,500.0 11/23/2020: Hemoglobin 11.9; Platelets 130 11/30/2020: BUN 23; Creatinine, Ser 1.44; Potassium 4.9; Sodium 142    Lipid Panel     Component Value Date/Time   CHOL 123 11/23/2020 0344   TRIG 138 11/23/2020 0344   HDL 39 (L) 11/23/2020 0344    CHOLHDL 3.2 11/23/2020 0344   VLDL 28 11/23/2020 0344   LDLCALC 56 11/23/2020 0344     Wt Readings from Last 3 Encounters:  11/30/20 162 lb 6.4 oz (73.7 kg)  11/23/20 152 lb 12.5 oz (69.3 kg)      Other studies Reviewed: Additional studies/ records that were reviewed today include: TTE 11/21/20  Review of the above records today demonstrates:  1. Left ventricular ejection fraction, by estimation, is 20 to 25%. The  left ventricle has severely decreased function. The left ventricle  demonstrates global hypokinesis. The left ventricular internal cavity size  was moderately dilated. Left  ventricular diastolic parameters are consistent with Grade III diastolic  dysfunction (restrictive). Elevated left atrial pressure.  2. Right ventricular systolic function is mildly reduced. The right  ventricular size is mildly enlarged. There is normal pulmonary artery  systolic pressure.  3. Left atrial size was moderately dilated.  4. Right atrial size was severely dilated.  5. The mitral valve is normal in structure. Trivial mitral valve  regurgitation.  6. The aortic valve is tricuspid. Aortic valve regurgitation is trivial.  No aortic stenosis is present.  7. The inferior vena cava is dilated in size with <50% respiratory  variability, suggesting right atrial pressure of 15 mmHg.    ASSESSMENT AND PLAN:  1.  Chronic systolic heart failure: Ejection fraction 25%.  This is been ongoing for many years.  He is currently on optimal medical therapy.  He would benefit from ICD therapy.  Risk and benefits of been discussed include bleeding, tamponade, infection, pneumothorax.  The patient understands these risks and is agreed to the procedure.  2.  Coronary artery disease: Continue aspirin, statin, Coreg per primary cardiology.  3.  Hypertension:***    Current medicines are reviewed at length with the patient today.   The patient {ACTIONS; HAS/DOES NOT HAVE:19233} concerns regarding  his medicines.  The following changes were made today:  {NONE DEFAULTED:18576::"none"}  Labs/ tests ordered today include: *** No orders of the defined types were placed in this encounter.    Disposition:   FU with Joseph Tanner {gen number [id number]} {Days to years:10300}  Signed, Asees Manfredi Jorja Loa, MD  12/18/2020 9:21 AM     [facility] [address] [place] [address] [phone] (office) [phone] (fax)

## 2020-12-28 ENCOUNTER — Institutional Professional Consult (permissible substitution): Payer: Medicare HMO | Admitting: Cardiology

## 2020-12-28 NOTE — Progress Notes (Deleted)
Electrophysiology Office Note   Date:  12/28/2020   ID:  Joseph Tanner, DOB 1950/10/10, MRN [id number]  PCP:  Oneita Hurt, No  Cardiologist:  Duke Salvia Primary Electrophysiologist:  Landen Knoedler Jorja Loa, MD    Chief Complaint: CHF   History of Present Illness: Joseph Tanner is a 70 y.o. male who is being seen today for the evaluation of CHF at the request of [name], MD. Presenting today for electrophysiology evaluation.  He has a history of chronic systolic and diastolic heart failure, CKD stage III, coronary artery disease status post non-STEMI, hypertension, hyperlipidemia, alcohol abuse.  He recently moved to [place] from [place].  His non-STEMI occurred in 2019.  He initially did not have a left heart catheterization.  Prior to leaving [place] he was hospitalized March 2022 with non-STEMI and heart failure symptoms.  Catheterization revealed severe stenosis of a circumflex and mild to moderate stenosis of the RCA which was large and heavily calcified.  His ejection fraction was 25%.  He presented to [facility] 11/20/2020 with acute on chronic systolic heart failure.  BNP was 4500.  He was started on Entresto.  Today, he denies*** symptoms of palpitations, chest pain, shortness of breath, orthopnea, PND, lower extremity edema, claudication, dizziness, presyncope, syncope, bleeding, or neurologic sequela. The patient is tolerating medications without difficulties.    Past Medical History:  Diagnosis Date  . Hyperlipemia   . Hypertension   . NSTEMI (non-ST elevated myocardial infarction) (HCC)    No past surgical history on file.   Current Outpatient Medications  Medication Sig Dispense Refill  . amoxicillin-clavulanate (AUGMENTIN) 500-125 MG tablet Take 1 tablet (500 mg total) by mouth every 12 (twelve) hours. 14 tablet 0  . aspirin EC 81 MG EC tablet Take 1 tablet (81 mg total) by mouth daily. Swallow whole.    Marland Kitchen atorvastatin (LIPITOR) 40  MG tablet Take 1 tablet by mouth daily.    . carvedilol (COREG) 6.25 MG tablet Take 6.25 mg by mouth 2 (two) times daily.    . Multiple Vitamin (MULTIVITAMIN WITH MINERALS) TABS tablet Take 1 tablet by mouth daily.    . sacubitril-valsartan (ENTRESTO) 24-26 MG Take 1 tablet by mouth 2 (two) times daily. 60 tablet 2   No current facility-administered medications for this visit.    Allergies:   Patient has no known allergies.   Social History:  The patient  reports that he has been smoking. He has never used smokeless tobacco. He reports current alcohol use. He reports that he does not use drugs.   Family History:  The patient's family history includes Heart failure in his maternal grandfather, maternal grandmother, mother, paternal grandfather, and paternal grandmother.    ROS:  Please see the history of present illness.   Otherwise, review of systems is positive for none.   All other systems are reviewed and negative.    PHYSICAL EXAM: VS:  There were no vitals taken for this visit. , BMI There is no height or weight on file to calculate BMI. GEN: Well nourished, well developed, in no acute distress  HEENT: normal  Neck: no JVD, carotid bruits, or masses Cardiac: ***RRR; no murmurs, rubs, or gallops,no edema  Respiratory:  clear to auscultation bilaterally, normal work of breathing GI: soft, nontender, nondistended, + BS MS: no deformity or atrophy  Skin: warm and dry Neuro:  Strength and sensation are intact Psych: euthymic mood, full affect  EKG:  EKG is not ordered today. Personal review of the  ekg ordered 11/20/20 shows sinus rhythm, PACs, LVH  Recent Labs: 11/20/2020: ALT 18; B Natriuretic Peptide >4,500.0 11/23/2020: Hemoglobin 11.9; Platelets 130 11/30/2020: BUN 23; Creatinine, Ser 1.44; Potassium 4.9; Sodium 142    Lipid Panel     Component Value Date/Time   CHOL 123 11/23/2020 0344   TRIG 138 11/23/2020 0344   HDL 39 (L) 11/23/2020 0344   CHOLHDL 3.2 11/23/2020 0344    VLDL 28 11/23/2020 0344   LDLCALC 56 11/23/2020 0344     Wt Readings from Last 3 Encounters:  11/30/20 162 lb 6.4 oz (73.7 kg)  11/23/20 152 lb 12.5 oz (69.3 kg)      Other studies Reviewed: Additional studies/ records that were reviewed today include: TTE 11/21/20  Review of the above records today demonstrates:  1. Left ventricular ejection fraction, by estimation, is 20 to 25%. The  left ventricle has severely decreased function. The left ventricle  demonstrates global hypokinesis. The left ventricular internal cavity size  was moderately dilated. Left  ventricular diastolic parameters are consistent with Grade III diastolic  dysfunction (restrictive). Elevated left atrial pressure.  2. Right ventricular systolic function is mildly reduced. The right  ventricular size is mildly enlarged. There is normal pulmonary artery  systolic pressure.  3. Left atrial size was moderately dilated.  4. Right atrial size was severely dilated.  5. The mitral valve is normal in structure. Trivial mitral valve  regurgitation.  6. The aortic valve is tricuspid. Aortic valve regurgitation is trivial.  No aortic stenosis is present.  7. The inferior vena cava is dilated in size with <50% respiratory  variability, suggesting right atrial pressure of 15 mmHg.    Left heart cath 10/17/2020:  Left ventricular dysfunction out of proportion to his coronary artery  disease   Severe stenosis of a small left circumflex, mild-to-moderate stenosis of  a large RCA   Elevated left ventricular end-diastolic filling pressure   Right radial loop   Successful Perclose closure of the right femoral artery   ASSESSMENT AND PLAN:  1.  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  This is likely due to ischemia as well as likely alcohol abuse.  He does have a long family history of heart failure.  He is currently on optimal medical therapy  with Entresto and carvedilol.  He would benefit from ICD implant for primary prevention.  Risk and benefits have been discussed include bleeding, tamponade, infection, pneumothorax, among others.  He understands these risks and is agreed to the procedure.  2.  Coronary artery disease: Medically managed.  No current chest pain.  Plan per primary cardiology.  3.  Hyperlipidemia: Continue atorvastatin per primary cardiology  Case discussed with primary cardiology.  Current medicines are reviewed at length with the patient today.   The patient {ACTIONS; HAS/DOES NOT HAVE:19233} concerns regarding his medicines.  The following changes were made today:  {NONE DEFAULTED:18576::"none"}  Labs/ tests ordered today include: *** No orders of the defined types were placed in this encounter.    Disposition:   FU with Rowdy Guerrini {gen number [id number]} {Days to years:10300}  Signed, Bernyce Brimley Jorja Loa, MD  12/28/2020 8:18 AM     [facility] [address] [place] [address] [phone] (office) [phone] (fax)

## 2021-01-11 ENCOUNTER — Institutional Professional Consult (permissible substitution): Payer: Medicare HMO | Admitting: Cardiology

## 2021-01-11 NOTE — Progress Notes (Deleted)
Electrophysiology Office Note   Date:  01/11/2021   ID:  Joseph Tanner, DOB 1951/07/18, MRN [id number]  PCP:  Oneita Hurt, No  Cardiologist:  Duke Salvia Primary Electrophysiologist:  Maciah Schweigert Jorja Loa, MD    Chief Complaint: CHF   History of Present Illness: Joseph Tanner is a 71 y.o. male who is being seen today for the evaluation of CHF at the request of [name], MD. Presenting today for electrophysiology evaluation.  He has a history significant for chronic systolic and diastolic heart failure, CKD stage III, coronary artery disease status post non-STEMI with PCI, hypertension, hyperlipidemia, alcohol abuse.  He recently moved from [place] to [place].  In [place] he was hospitalized March 2022 with recurrent non-STEMI.  Left heart catheterization revealed severe stenosis of a small circumflex and mild to moderate stenosis of the RCA.  His ejection fraction was 25%.  He declined ICD at the time.  He presented [facility] 11/20/2020 with acute on chronic systolic and diastolic heart failure.  He was diuresed and switched from BiDil to [place].  Today, he denies*** symptoms of palpitations, chest pain, shortness of breath, orthopnea, PND, lower extremity edema, claudication, dizziness, presyncope, syncope, bleeding, or neurologic sequela. The patient is tolerating medications without difficulties.    Past Medical History:  Diagnosis Date   Hyperlipemia    Hypertension    NSTEMI (non-ST elevated myocardial infarction) (HCC)    No past surgical history on file.   Current Outpatient Medications  Medication Sig Dispense Refill   amoxicillin-clavulanate (AUGMENTIN) 500-125 MG tablet Take 1 tablet (500 mg total) by mouth every 12 (twelve) hours. 14 tablet 0   aspirin EC 81 MG EC tablet Take 1 tablet (81 mg total) by mouth daily. Swallow whole.     atorvastatin (LIPITOR) 40 MG tablet Take 1 tablet by mouth daily.     carvedilol (COREG) 6.25 MG  tablet Take 6.25 mg by mouth 2 (two) times daily.     Multiple Vitamin (MULTIVITAMIN WITH MINERALS) TABS tablet Take 1 tablet by mouth daily.     sacubitril-valsartan (ENTRESTO) 24-26 MG Take 1 tablet by mouth 2 (two) times daily. 60 tablet 2   No current facility-administered medications for this visit.    Allergies:   Patient has no known allergies.   Social History:  The patient  reports that he has been smoking. He has never used smokeless tobacco. He reports current alcohol use. He reports that he does not use drugs.   Family History:  The patient's family history includes Heart failure in his maternal grandfather, maternal grandmother, mother, paternal grandfather, and paternal grandmother.    ROS:  Please see the history of present illness.   Otherwise, review of systems is positive for none.   All other systems are reviewed and negative.    PHYSICAL EXAM: VS:  There were no vitals taken for this visit. , BMI There is no height or weight on file to calculate BMI. GEN: Well nourished, well developed, in no acute distress  HEENT: normal  Neck: no JVD, carotid bruits, or masses Cardiac: ***RRR; no murmurs, rubs, or gallops,no edema  Respiratory:  clear to auscultation bilaterally, normal work of breathing GI: soft, nontender, nondistended, + BS MS: no deformity or atrophy  Skin: warm and dry Neuro:  Strength and sensation are intact Psych: euthymic mood, full affect  EKG:  EKG {ACTION; IS/IS DDU:20254270} ordered today. Personal review of the ekg ordered *** shows ***  Recent Labs: 11/20/2020: ALT 18; B  Natriuretic Peptide >4,500.0 11/23/2020: Hemoglobin 11.9; Platelets 130 11/30/2020: BUN 23; Creatinine, Ser 1.44; Potassium 4.9; Sodium 142    Lipid Panel     Component Value Date/Time   CHOL 123 11/23/2020 0344   TRIG 138 11/23/2020 0344   HDL 39 (L) 11/23/2020 0344   CHOLHDL 3.2 11/23/2020 0344   VLDL 28 11/23/2020 0344   LDLCALC 56 11/23/2020 0344     Wt Readings  from Last 3 Encounters:  11/30/20 162 lb 6.4 oz (73.7 kg)  11/23/20 152 lb 12.5 oz (69.3 kg)      Other studies Reviewed: Additional studies/ records that were reviewed today include: TTE 11/21/20  Review of the above records today demonstrates:   1. Left ventricular ejection fraction, by estimation, is 20 to 25%. The  left ventricle has severely decreased function. The left ventricle  demonstrates global hypokinesis. The left ventricular internal cavity size  was moderately dilated. Left  ventricular diastolic parameters are consistent with Grade III diastolic  dysfunction (restrictive). Elevated left atrial pressure.   2. Right ventricular systolic function is mildly reduced. The right  ventricular size is mildly enlarged. There is normal pulmonary artery  systolic pressure.   3. Left atrial size was moderately dilated.   4. Right atrial size was severely dilated.   5. The mitral valve is normal in structure. Trivial mitral valve  regurgitation.   6. The aortic valve is tricuspid. Aortic valve regurgitation is trivial.  No aortic stenosis is present.   7. The inferior vena cava is dilated in size with <50% respiratory  variability, suggesting right atrial pressure of 15 mmHg.    ASSESSMENT AND PLAN:  1.  Chronic systolic heart failure: Ejection fraction 25%.  He is currently on optimal medical therapy.  His ejection fraction has been reduced for quite some time.  He would benefit from ICD implant.  Risks include but not limited to bleeding, infection, pneumothorax, perforation, tamponade, vascular damage, renal failure, MI, stroke, death, and lead dislodgement.  He understands these risks and is agreed to the procedure.  2.  Coronary artery disease: Currently medically managed.  Continue aspirin, atorvastatin, carvedilol.  No current chest pain.  3.  Hypertension:***   Nelwyn Hebdon, MD 01/11/2021 8:27 AM     Current medicines are reviewed at length with the patient today.    The patient {ACTIONS; HAS/DOES NOT HAVE:19233} concerns regarding his medicines.  The following changes were made today:  {NONE DEFAULTED:18576}  Labs/ tests ordered today include: *** No orders of the defined types were placed in this encounter.    Disposition:   FU with Jannett Schmall {gen number [id number]} {Days to years:10300}  Signed, Aalijah Mims Jorja Loa, MD  01/11/2021 8:23 AM     [facility] [address] [place] [address] [phone] (office) [phone] (fax)

## 2021-01-26 ENCOUNTER — Encounter: Payer: Self-pay | Admitting: Cardiology

## 2021-02-03 ENCOUNTER — Emergency Department (HOSPITAL_COMMUNITY)
Admission: EM | Admit: 2021-02-03 | Discharge: 2021-02-03 | Disposition: A | Discharge status: Home / Self Care | Payer: Medicare HMO | Attending: Emergency Medicine | Admitting: Emergency Medicine

## 2021-02-03 ENCOUNTER — Encounter (HOSPITAL_COMMUNITY): Payer: Self-pay | Admitting: *Deleted

## 2021-02-03 ENCOUNTER — Encounter (HOSPITAL_COMMUNITY): Payer: Self-pay | Admitting: Emergency Medicine

## 2021-02-03 ENCOUNTER — Ambulatory Visit (HOSPITAL_COMMUNITY)
Admission: EM | Admit: 2021-02-03 | Discharge: 2021-02-03 | Disposition: A | Discharge status: Other Acute Inpatient Hospital | Payer: Medicare HMO | Attending: Medical Oncology | Admitting: Medical Oncology

## 2021-02-03 ENCOUNTER — Other Ambulatory Visit: Payer: Self-pay

## 2021-02-03 ENCOUNTER — Emergency Department (HOSPITAL_COMMUNITY): Payer: Medicare HMO

## 2021-02-03 DIAGNOSIS — J449 Chronic obstructive pulmonary disease, unspecified: Secondary | ICD-10-CM | POA: Diagnosis not present

## 2021-02-03 DIAGNOSIS — F17200 Nicotine dependence, unspecified, uncomplicated: Secondary | ICD-10-CM | POA: Diagnosis not present

## 2021-02-03 DIAGNOSIS — I519 Heart disease, unspecified: Secondary | ICD-10-CM | POA: Diagnosis not present

## 2021-02-03 DIAGNOSIS — I130 Hypertensive heart and chronic kidney disease with heart failure and stage 1 through stage 4 chronic kidney disease, or unspecified chronic kidney disease: Secondary | ICD-10-CM | POA: Diagnosis not present

## 2021-02-03 DIAGNOSIS — R0789 Other chest pain: Secondary | ICD-10-CM | POA: Diagnosis not present

## 2021-02-03 DIAGNOSIS — I2510 Atherosclerotic heart disease of native coronary artery without angina pectoris: Secondary | ICD-10-CM | POA: Diagnosis not present

## 2021-02-03 DIAGNOSIS — R059 Cough, unspecified: Secondary | ICD-10-CM

## 2021-02-03 DIAGNOSIS — N183 Chronic kidney disease, stage 3 unspecified: Secondary | ICD-10-CM | POA: Insufficient documentation

## 2021-02-03 DIAGNOSIS — Z79899 Other long term (current) drug therapy: Secondary | ICD-10-CM | POA: Insufficient documentation

## 2021-02-03 DIAGNOSIS — I252 Old myocardial infarction: Secondary | ICD-10-CM

## 2021-02-03 DIAGNOSIS — I5042 Chronic combined systolic (congestive) and diastolic (congestive) heart failure: Secondary | ICD-10-CM | POA: Insufficient documentation

## 2021-02-03 LAB — CBC
HCT: 39.2 % (ref 39.0–52.0)
Hemoglobin: 12.5 g/dL — ABNORMAL LOW (ref 13.0–17.0)
MCH: 28.2 pg (ref 26.0–34.0)
MCHC: 31.9 g/dL (ref 30.0–36.0)
MCV: 88.3 fL (ref 80.0–100.0)
Platelets: 131 10*3/uL — ABNORMAL LOW (ref 150–400)
RBC: 4.44 MIL/uL (ref 4.22–5.81)
RDW: 17.2 % — ABNORMAL HIGH (ref 11.5–15.5)
WBC: 3.9 10*3/uL — ABNORMAL LOW (ref 4.0–10.5)
nRBC: 0 % (ref 0.0–0.2)

## 2021-02-03 LAB — BASIC METABOLIC PANEL
Anion gap: 7 (ref 5–15)
BUN: 19 mg/dL (ref 8–23)
CO2: 23 mmol/L (ref 22–32)
Calcium: 8.8 mg/dL — ABNORMAL LOW (ref 8.9–10.3)
Chloride: 108 mmol/L (ref 98–111)
Creatinine, Ser: 1.58 mg/dL — ABNORMAL HIGH (ref 0.61–1.24)
GFR, Estimated: 47 mL/min — ABNORMAL LOW (ref >60)
Glucose, Bld: 103 mg/dL — ABNORMAL HIGH (ref 70–99)
Potassium: 3.5 mmol/L (ref 3.5–5.1)
Sodium: 138 mmol/L (ref 135–145)

## 2021-02-03 LAB — TROPONIN I (HIGH SENSITIVITY)
Troponin I (High Sensitivity): 72 ng/L — ABNORMAL HIGH (ref <18)
Troponin I (High Sensitivity): 69 ng/L — ABNORMAL HIGH (ref <18)

## 2021-02-03 MED ORDER — PREDNISONE 20 MG PO TABS: 40.0000 mg | ORAL_TABLET | Freq: Every day | ORAL | 0 refills | Status: DC

## 2021-02-03 MED ORDER — ASPIRIN 81 MG PO CHEW
CHEWABLE_TABLET | ORAL | Status: AC
  Filled 2021-02-03: qty 4

## 2021-02-03 MED ORDER — ALBUTEROL SULFATE HFA 108 (90 BASE) MCG/ACT IN AERS
2.0000 | INHALATION_SPRAY | RESPIRATORY_TRACT | Status: DC | PRN
  Administered 2021-02-03: 2 via RESPIRATORY_TRACT
  Filled 2021-02-03: qty 6.7

## 2021-02-03 MED ORDER — PREDNISONE 20 MG PO TABS
40.0000 mg | ORAL_TABLET | Freq: Once | ORAL | Status: AC
  Administered 2021-02-03: 40 mg via ORAL
  Filled 2021-02-03: qty 2

## 2021-02-03 MED ORDER — ASPIRIN 81 MG PO CHEW
324.0000 mg | CHEWABLE_TABLET | Freq: Once | ORAL | Status: AC
  Administered 2021-02-03: 324 mg via ORAL

## 2021-02-03 NOTE — Discharge Instructions (Signed)
You have a change in your EKG in in lead, V3 as highlighted. This could indicate you are having a heart event such as a heart attack which you carry a risk for given your history of heart disease and NSTEMI. You have refused transport by ambulance and therefore I urge you to have your family member transport you to the hospital now.

## 2021-02-03 NOTE — ED Triage Notes (Signed)
Pt sent from [facility].  Reports right sided chest pain 3 days ago. States he has a dry cough.  EKG done at [facility] and pt sent to ED.  Denies chest pain at present.

## 2021-02-03 NOTE — ED Notes (Signed)
Pt alert and oriented and verbalized understanding of discharge instructions. No questions at this time

## 2021-02-03 NOTE — ED Provider Notes (Addendum)
[facility]   MRN: [id number] DOB: 1950/11/23  Subjective:   Joseph Tanner is a 70 y.o. male with pmh of chronic combined systolic and diastolic heart failure, demand ischemia, CAD, NSTEMI, CKD, HTN presenting for 3-week history of persistent intermittent moderate to severe right-sided chest pain and shortness of breath.  Patient has not tried any medications for relief.  His NSTEMI was initially in 2019 and he had a recurrence in March 2022.  Denies headache, confusion, weakness, heart racing, palpitations, diaphoresis, nausea, vomiting, abdominal pain.  No current facility-administered medications for this encounter.  Current Outpatient Medications:    Multiple Vitamin (MULTIVITAMIN WITH MINERALS) TABS tablet, Take 1 tablet by mouth daily., Disp: , Rfl:    amoxicillin-clavulanate (AUGMENTIN) 500-125 MG tablet, Take 1 tablet (500 mg total) by mouth every 12 (twelve) hours., Disp: 14 tablet, Rfl: 0   aspirin EC 81 MG EC tablet, Take 1 tablet (81 mg total) by mouth daily. Swallow whole., Disp: , Rfl:    atorvastatin (LIPITOR) 40 MG tablet, Take 1 tablet by mouth daily., Disp: , Rfl:    carvedilol (COREG) 6.25 MG tablet, Take 6.25 mg by mouth 2 (two) times daily., Disp: , Rfl:    sacubitril-valsartan (ENTRESTO) 24-26 MG, Take 1 tablet by mouth 2 (two) times daily., Disp: 60 tablet, Rfl: 2   No Known Allergies  Past Medical History:  Diagnosis Date   Hyperlipemia    Hypertension    NSTEMI (non-ST elevated myocardial infarction) (HCC)      History reviewed. No pertinent surgical history.  Family History  Problem Relation Age of Onset   Heart failure Mother    Heart failure Maternal Grandmother    Heart failure Maternal Grandfather    Heart failure Paternal Grandmother    Heart failure Paternal Grandfather     Social History   Tobacco Use   Smoking status: Some Days    Pack years: 0.00   Smokeless tobacco: Never  Substance Use Topics   Alcohol  use: Yes   Drug use: Never    ROS   Objective:   Vitals: BP 115/67   Pulse 92   Temp 97.8 F (36.6 C)   Resp 20   SpO2 100%   Physical Exam Constitutional:      General: He is not in acute distress.    Appearance: Normal appearance. He is well-developed. He is not ill-appearing, toxic-appearing or diaphoretic.  HENT:     Head: Normocephalic and atraumatic.     Right Ear: External ear normal.     Left Ear: External ear normal.     Nose: Nose normal.     Mouth/Throat:     Mouth: Mucous membranes are moist.     Pharynx: Oropharynx is clear.  Eyes:     General: No scleral icterus.    Extraocular Movements: Extraocular movements intact.     Pupils: Pupils are equal, round, and reactive to light.  Cardiovascular:     Rate and Rhythm: Normal rate and regular rhythm.     Heart sounds: Normal heart sounds. No murmur heard.   No friction rub. No gallop.  Pulmonary:     Effort: Pulmonary effort is normal. No respiratory distress.     Breath sounds: Normal breath sounds. No stridor. No wheezing, rhonchi or rales.  Neurological:     Mental Status: He is alert and oriented to person, place, and time.  Psychiatric:        Mood and Affect: Mood normal.  Behavior: Behavior normal.        Thought Content: Thought content normal.    ED ECG REPORT   Date: 02/03/2021  Rate: 89  Rhythm: normal sinus rhythm  QRS Axis: left  Intervals: QT prolonged  ST/T Wave abnormalities: nonspecific ST/T changes  Conduction Disutrbances:none  Narrative Interpretation: Sinus rhythm at 89 bpm, with ST elevation in lead V3 only which is different from previous EKG done on 12/20/2020.  Otherwise, nonspecific T wave inversion in lead V5, V6, T wave flattening in V2 very comparable to previous EKG.  Voltage criteria for LVH.  Old EKG Reviewed: changes noted  I have personally reviewed the EKG tracing and agree with the computerized printout as noted.   Assessment and Plan :   PDMP not  reviewed this encounter.  1. Atypical chest pain   2. History of non-ST elevation myocardial infarction (NSTEMI)   3. Heart disease     Patient has atypical right-sided chest pain with shortness of breath for the past 3 weeks.  EKG from today shows ST elevation in V3 only which is different from his prior EKG done this past April.  Discussed with patient and his daughter that he is high risk to have a heart event, demand ischemia or acute on chronic heart failure given his heart history.  They refused transport to the hospital by EMS, contract for safety and will report to the hospital now by personal vehicle.  They were agreeable to 324 mg chewable aspirin prior to discharge.    Wallis Bamberg, [place] 02/03/21 1552

## 2021-02-03 NOTE — ED Provider Notes (Signed)
[facility] EMERGENCY DEPARTMENT Provider Note   CSN: [id number] Arrival date & time: 02/03/21  1546     History Chief Complaint  Patient presents with   Chest Pain    Joseph Tanner is a 70 y.o. male.  Patient with history of coronary artery disease, chronic kidney disease, congestive heart failure, COPD--presents to the emergency department today for a cough.  Patient was at urgent care prior to ED arrival for evaluation of cough.  At that time he had an EKG with changes, prompting emergency department evaluation and referral.  Patient states that his cough has been going on for about 3 days.  Sometimes it is productive, other times it is dry.  He has congestion in his chest, little more on the right side.  He denies having any chest pain or pressure to me, despite being mentioned in the triage note.  Patient denies any worsening shortness of breath, lower extremity swelling, orthopnea.  He denies fever or other URI symptoms with the cough.  He has been taking his medications as prescribed.  Patient states that he does not have any specific COPD medications prescribed to him, however has been using family members nebulizer machine at times.      Past Medical History:  Diagnosis Date   Hyperlipemia    Hypertension    NSTEMI (non-ST elevated myocardial infarction) [facility])     Patient Active Problem List   Diagnosis Date Noted   Severe malnutrition (HCC) 11/22/2020   Demand ischemia (HCC) 11/21/2020   Coronary artery disease 11/21/2020   CKD (chronic kidney disease) stage 3, GFR 30-59 ml/min (HCC) 11/21/2020   Nephrolithiasis 11/21/2020   Pyuria 11/21/2020   Protein-calorie malnutrition, severe 11/21/2020   Chronic combined systolic and diastolic heart failure (HCC) 11/20/2020    History reviewed. No pertinent surgical history.     Family History  Problem Relation Age of Onset   Heart failure Mother    Heart failure Maternal Grandmother    Heart  failure Maternal Grandfather    Heart failure Paternal Grandmother    Heart failure Paternal Grandfather     Social History   Tobacco Use   Smoking status: Some Days    Pack years: 0.00   Smokeless tobacco: Never  Substance Use Topics   Alcohol use: Yes   Drug use: Never    Home Medications Prior to Admission medications   Medication Sig Start Date End Date Taking? Authorizing Provider  amoxicillin-clavulanate (AUGMENTIN) 500-125 MG tablet Take 1 tablet (500 mg total) by mouth every 12 (twelve) hours. 12/04/20   [name], MD  aspirin EC 81 MG EC tablet Take 1 tablet (81 mg total) by mouth daily. Swallow whole. 11/25/20   Standley Brooking, MD  atorvastatin (LIPITOR) 40 MG tablet Take 1 tablet by mouth daily. 10/18/20   [provider]  carvedilol (COREG) 6.25 MG tablet Take 6.25 mg by mouth 2 (two) times daily. 10/18/20   [provider]  Multiple Vitamin (MULTIVITAMIN WITH MINERALS) TABS tablet Take 1 tablet by mouth daily. 11/25/20   Standley Brooking, MD  sacubitril-valsartan (ENTRESTO) 24-26 MG Take 1 tablet by mouth 2 (two) times daily. 11/24/20   Standley Brooking, MD    Allergies    Patient has no known allergies.  Review of Systems   Review of Systems  Constitutional:  Negative for fever.  HENT:  Negative for rhinorrhea and sore throat.   Eyes:  Negative for redness.  Respiratory:  Positive for  cough. Negative for shortness of breath.   Cardiovascular:  Negative for chest pain and leg swelling.  Gastrointestinal:  Negative for abdominal pain, diarrhea, nausea and vomiting.  Genitourinary:  Negative for dysuria and hematuria.  Musculoskeletal:  Negative for myalgias.  Skin:  Negative for rash.  Neurological:  Negative for headaches.   Physical Exam Updated Vital Signs BP (!) 128/102   Pulse 91   Temp 98.1 F (36.7 C)   Resp 16   SpO2 100%   Physical Exam Vitals and nursing note reviewed.  Constitutional:      Appearance: He is  well-developed. He is not diaphoretic.  HENT:     Head: Normocephalic and atraumatic.     Mouth/Throat:     Mouth: Mucous membranes are not dry.  Eyes:     Conjunctiva/sclera: Conjunctivae normal.  Neck:     Vascular: Normal carotid pulses. No carotid bruit or JVD.     Trachea: Trachea normal. No tracheal deviation.  Cardiovascular:     Rate and Rhythm: Normal rate and regular rhythm.     Pulses: No decreased pulses.          Radial pulses are 2+ on the right side and 2+ on the left side.     Heart sounds: Normal heart sounds, S1 normal and S2 normal. Heart sounds not distant. No murmur heard. Pulmonary:     Effort: Pulmonary effort is normal. No respiratory distress.     Breath sounds: Normal breath sounds. No wheezing, rhonchi or rales.  Chest:     Chest wall: No tenderness.  Abdominal:     General: Bowel sounds are normal.     Palpations: Abdomen is soft.     Tenderness: There is no abdominal tenderness. There is no guarding or rebound.  Musculoskeletal:     Cervical back: Normal range of motion and neck supple. No muscular tenderness.     Right lower leg: No edema.     Left lower leg: No edema.     Comments: Clubbing of fingers.   Skin:    General: Skin is warm and dry.     Coloration: Skin is not pale.  Neurological:     Mental Status: He is alert. Mental status is at baseline.  Psychiatric:        Mood and Affect: Mood normal.    ED Results / Procedures / Treatments   Labs (all labs ordered are listed, but only abnormal results are displayed) Labs Reviewed  BASIC METABOLIC PANEL  CBC  TROPONIN I (HIGH SENSITIVITY)    EKG EKG Interpretation  Date/Time:  Saturday February 03 2021 15:51:52 EDT Ventricular Rate:  90 PR Interval:  170 QRS Duration: 108 QT Interval:  408 QTC Calculation: 499 R Axis:   -38 Text Interpretation: Sinus rhythm with Premature atrial complexes Left axis deviation Minimal voltage criteria for LVH, may be normal variant ( Cornell product )  ST & T wave abnormality, consider lateral ischemia Prolonged QT Abnormal ECG Since last tracing of earlier today No significant change was found Confirmed by Mancel Bale [phone]) on 02/03/2021 4:19:59 PM  Radiology No results found.  Procedures Procedures   Medications Ordered in ED Medications - No data to display  ED Course  I have reviewed the triage vital signs and the nursing notes.  Pertinent labs & imaging results that were available during my care of the patient were reviewed by me and considered in my medical decision making (see chart for details).  Patient seen and  examined. Work-up initiated. Reviewed UCC notes, EKGs with Dr. Effie Shy. He looks well. Does not appear to be volume overloaded.   Vital signs reviewed and are as follows: BP (!) 128/102   Pulse 91   Temp 98.1 F (36.7 C)   Resp 16   SpO2 100%   7:17 PM Troponin 72 >> 69. Stable suspect elevated 2/2 CHF and CKD.   Will give trial prednisone for COPD exacerbation and albuterol inhaler.   Pt discussed with and seen by Dr. Effie Shy as well.   7:33 PM Pt updated, agrees with plan.   Patient urged to return with worsening symptoms, CP, SOB or other concerns. Patient verbalized understanding and agrees with plan.      MDM Rules/Calculators/A&P                          Patient here for eval of cough.  Chest x-ray without pneumonia or edema.  Troponin elevated, but flat.  Do not suspect heart failure exacerbation.  Suspect that this may be related to medications and/or possible mild COPD exacerbation.  Information for pulmonology referral given to patient.  There is concern of EKG at urgent care.  Suspected changes noted on related to LVH.  Again no signs of ACS clinically.  Do not suspect STEMI.  Patient actually denies chest pain to myself stating that his cough is in his right chest, he is not really in any pain. He looks comfortable. Plan as above.     Final Clinical Impression(s) / ED Diagnoses Final  diagnoses:  Cough  Chronic obstructive pulmonary disease, unspecified COPD type (HCC)    Rx / DC Orders ED Discharge Orders          Ordered    predniSONE (DELTASONE) 20 MG tablet  Daily        02/03/21 1936             Renne Crigler, Cordelia Poche 02/03/21 2046    Mancel Bale, MD 02/03/21 2050

## 2021-02-03 NOTE — Discharge Instructions (Signed)
Please read and follow all provided instructions.  Your diagnoses today include:  1. Cough   2. Chronic obstructive pulmonary disease, unspecified COPD type (HCC)     Tests performed today include: Chest x-ray - did not show pneumonia Blood cell counts (white, red, and platelets) Electrolytes  Kidney function test EKG Troponin - cardiac enzyme elevated but stable Vital signs. See below for your results today.   Medications prescribed:  Prednisone - steroid medicine   It is best to take this medication in the morning to prevent sleeping problems. If you are diabetic, monitor your blood sugar closely and stop taking Prednisone if blood sugar is over 300. Take with food to prevent stomach upset.   Albuterol inhaler - medication that opens up your airway  Use inhaler as follows: 1-2 puffs with spacer every 4 hours as needed for wheezing, cough, or shortness of breath.   Take any prescribed medications only as directed.  Home care instructions:  Follow any educational materials contained in this packet.  Follow-up instructions: Please follow-up with your primary care provider in the next 3 days for further evaluation of your symptoms and management of your COPD.   You may follow-up with the pulmonologist listed as well if desired.   Return instructions:  Please return to the Emergency Department if you experience worsening symptoms. Please return with worsening wheezing, shortness of breath, or difficulty breathing. Return with persistent fever above 101F.  Please return if you have any other emergent concerns.  Additional Information:  Your vital signs today were: BP (!) 127/105   Pulse 85   Temp 98.1 F (36.7 C)   Resp (!) 24   SpO2 100%  If your blood pressure (BP) was elevated above 135/85 this visit, please have this repeated by your doctor within one month. --------------

## 2021-02-03 NOTE — ED Provider Notes (Signed)
  Face-to-face evaluation   History: He presents for evaluation of persistent cough, occasional productive of white phlegm for several months.  He states this started after he began taking Entresto.  He denies fever, chills, chest pain, weakness or dizziness.  He was seen earlier today at a urgent care center for further evaluation after diagnosis EKG was abnormal and felt like he was having chest pain.  Patient again denies having chest pain.  He is currently a smoker but has cut down to about a pack a week.  Physical exam: Elderly, slender, alert and comfortable.  Lungs with slightly diminished air movement no audible wheezes rales or rhonchi.  There is no increased work of breathing.  He is lucid.  Medical screening examination/treatment/procedure(s) were conducted as a shared visit with non-physician practitioner(s) and myself.  I personally evaluated the patient during the encounter    Mancel Bale, MD 02/03/21 2050

## 2021-02-03 NOTE — ED Triage Notes (Signed)
PT reports CP and SHOB have been ongoing for 3 weeks.

## 2021-02-13 ENCOUNTER — Telehealth: Payer: Self-pay | Admitting: Cardiovascular Disease

## 2021-02-13 NOTE — Telephone Encounter (Signed)
 *  STAT* If patient is at the pharmacy, call can be transferred to refill team.   1. Which medications need to be refilled? (please list name of each medication and dose if known)   predniSONE (DELTASONE) 20 MG tablet   2. Which pharmacy/location (including street and city if local pharmacy) is medication to be sent to?  [facility] - [place], [place] - [address]  3. Do they need a 30 day or 90 day supply? Would like enough to get him to his visit on 8/19 with Dr Duke Salvia.

## 2021-02-14 NOTE — Telephone Encounter (Signed)
Pt's wife Lucille Passy is calling back to f/u on the prednisone refill. Pt's wife states that pt can not talk without coughing. Lucille Passy  can be reached at [phone]

## 2021-02-14 NOTE — Telephone Encounter (Signed)
Spoke with pt wife, aware will need to get approval from dr Duke Salvia to refill. Aware dr Duke Salvia is working in the hospital but will forward for her review.

## 2021-02-15 NOTE — Telephone Encounter (Signed)
Returned call to wife (EC) Pt informed of providers recommendations. Pt verbalized understanding. She tried to get in with PCP and he does not have  app for 3 weeks. Informed pt that we do not rx prednisone. She will take him to the ER then.

## 2021-02-15 NOTE — Telephone Encounter (Signed)
Wife of patient calling to follow up on refill request.

## 2021-02-21 ENCOUNTER — Encounter (HOSPITAL_COMMUNITY): Payer: Self-pay

## 2021-02-21 ENCOUNTER — Other Ambulatory Visit: Payer: Self-pay

## 2021-02-21 ENCOUNTER — Ambulatory Visit (HOSPITAL_COMMUNITY)
Admission: EM | Admit: 2021-02-21 | Discharge: 2021-02-21 | Disposition: A | Discharge status: Home / Self Care | Payer: Medicare HMO | Attending: Emergency Medicine | Admitting: Emergency Medicine

## 2021-02-21 ENCOUNTER — Ambulatory Visit (INDEPENDENT_AMBULATORY_CARE_PROVIDER_SITE_OTHER): Payer: Medicare HMO

## 2021-02-21 DIAGNOSIS — R0600 Dyspnea, unspecified: Secondary | ICD-10-CM

## 2021-02-21 DIAGNOSIS — R0602 Shortness of breath: Secondary | ICD-10-CM | POA: Diagnosis not present

## 2021-02-21 DIAGNOSIS — R0609 Other forms of dyspnea: Secondary | ICD-10-CM

## 2021-02-21 DIAGNOSIS — J441 Chronic obstructive pulmonary disease with (acute) exacerbation: Secondary | ICD-10-CM

## 2021-02-21 DIAGNOSIS — R059 Cough, unspecified: Secondary | ICD-10-CM

## 2021-02-21 MED ORDER — SPIRIVA RESPIMAT 1.25 MCG/ACT IN AERS: 2.0000 | INHALATION_SPRAY | Freq: Every day | RESPIRATORY_TRACT | 1 refills | Status: DC

## 2021-02-21 NOTE — Discharge Instructions (Addendum)
You will need to see pulmonology for further follow up care  Your x ray is negative for pneumonia  Take the inahler and the albuterol as needed  If symptoms becomes worse you will need to go to there er

## 2021-02-21 NOTE — ED Triage Notes (Signed)
Pt presents POV with c/o fatigue, SOB x 2 weeks.   Pt c/o on going cough and states he was given Prednisone. States it has given him some relief. States he feels tiredness in chest from the cough.

## 2021-02-21 NOTE — ED Provider Notes (Signed)
[facility]    CSN: [id number] Arrival date & time: 02/21/21  4835      History   Chief Complaint Chief Complaint  Patient presents with   Fatigue   Cough   Shortness of Breath    HPI Joseph Tanner is a 70 y.o. male.   Pt here for refill on prednisone. He was d/c from hospital recently for copd. Was given prednisone. States that he feels better but finds himself using albuterol 2-3x / day and since he does not have steroids its becoming worse. Denies any fevers, no chest pain, no body aches, phlem is productive and clear at times. Was asking for chest x ray. Sob is intermit more with movement. Has not seen his pulmologist since d/c.    Past Medical History:  Diagnosis Date   Hyperlipemia    Hypertension    NSTEMI (non-ST elevated myocardial infarction) [facility])     Patient Active Problem List   Diagnosis Date Noted   Severe malnutrition (HCC) 11/22/2020   Demand ischemia (HCC) 11/21/2020   Coronary artery disease 11/21/2020   CKD (chronic kidney disease) stage 3, GFR 30-59 ml/min (HCC) 11/21/2020   Nephrolithiasis 11/21/2020   Pyuria 11/21/2020   Protein-calorie malnutrition, severe 11/21/2020   Chronic combined systolic and diastolic heart failure (HCC) 11/20/2020    History reviewed. No pertinent surgical history.     Home Medications    Prior to Admission medications   Medication Sig Start Date End Date Taking? Authorizing Provider  Tiotropium Bromide Monohydrate (SPIRIVA RESPIMAT) 1.25 MCG/ACT AERS Inhale 2 puffs into the lungs daily. 02/21/21  Yes Coralyn Mark, NP  albuterol (VENTOLIN HFA) 108 (90 Base) MCG/ACT inhaler Inhale 2 puffs into the lungs every 6 (six) hours as needed for wheezing or shortness of breath.    [provider]  aspirin EC 81 MG EC tablet Take 1 tablet (81 mg total) by mouth daily. Swallow whole. 11/25/20   Standley Brooking, MD  atorvastatin (LIPITOR) 40 MG tablet Take 1 tablet by mouth every evening.  10/18/20   [provider]  carvedilol (COREG) 6.25 MG tablet Take 6.25 mg by mouth 2 (two) times daily. 10/18/20   [provider]  gabapentin (NEURONTIN) 100 MG capsule Take 100 mg by mouth daily as needed (pain).    [provider]  Multiple Vitamin (MULTIVITAMIN WITH MINERALS) TABS tablet Take 1 tablet by mouth daily. 11/25/20   Standley Brooking, MD  predniSONE (DELTASONE) 20 MG tablet Take 2 tablets (40 mg total) by mouth daily. 02/03/21   Renne Crigler, PA-C  sacubitril-valsartan (ENTRESTO) 24-26 MG Take 1 tablet by mouth 2 (two) times daily. 11/24/20   Standley Brooking, MD    Family History Family History  Problem Relation Age of Onset   Heart failure Mother    Heart failure Maternal Grandmother    Heart failure Maternal Grandfather    Heart failure Paternal Grandmother    Heart failure Paternal Grandfather     Social History Social History   Tobacco Use   Smoking status: Some Days   Smokeless tobacco: Never  Substance Use Topics   Alcohol use: Yes   Drug use: Never     Allergies   Patient has no known allergies.   Review of Systems Review of Systems  Constitutional:  Negative for activity change, fatigue and fever.  HENT: Negative.    Respiratory:  Positive for cough and shortness of breath.   Cardiovascular: Negative.   Gastrointestinal: Negative.  Genitourinary: Negative.   Musculoskeletal: Negative.   Skin: Negative.   Neurological: Negative.     Physical Exam Triage Vital Signs ED Triage Vitals  Enc Vitals Group     BP 02/21/21 0955 (!) 139/104     Pulse Rate 02/21/21 0953 94     Resp 02/21/21 0953 18     Temp 02/21/21 0953 97.7 F (36.5 C)     Temp Source 02/21/21 0953 Oral     SpO2 02/21/21 0953 100 %     Weight --      Height --      Head Circumference --      Peak Flow --      Pain Score 02/21/21 0952 8     Pain Loc --      Pain Edu? --      Excl. in GC? --    No data found.  Updated Vital Signs BP (!)  139/104   Pulse 94   Temp 97.7 F (36.5 C) (Oral)   Resp 18   SpO2 100%   Visual Acuity Right Eye Distance:   Left Eye Distance:   Bilateral Distance:    Right Eye Near:   Left Eye Near:    Bilateral Near:     Physical Exam Constitutional:      Appearance: He is well-developed.  Cardiovascular:     Rate and Rhythm: Normal rate.  Pulmonary:     Effort: Pulmonary effort is normal.     Breath sounds: Examination of the left-lower field reveals rhonchi. Rhonchi present.  Chest:     Chest wall: No tenderness.  Abdominal:     Palpations: Abdomen is soft.  Skin:    General: Skin is warm.     Capillary Refill: Capillary refill takes less than 2 seconds.  Neurological:     Mental Status: He is alert.     UC Treatments / Results  Labs (all labs ordered are listed, but only abnormal results are displayed) Labs Reviewed - No data to display  EKG   Radiology DG Chest 2 View  Result Date: 02/21/2021 CLINICAL DATA:  Shortness of breath.  History of COPD. EXAM: CHEST - 2 VIEW COMPARISON:  02/03/2021. FINDINGS: Mediastinum hilar structures normal. Cardiomegaly. No pulmonary venous congestion. No focal infiltrate. Bilateral pleural-parenchymal thickening again noted consistent scarring. No acute bony abnormality. IMPRESSION: 1.  Cardiomegaly.  No pulmonary venous congestion. 2. Bilateral pleural-parenchymal thickening again noted consistent scarring. No acute pulmonary abnormality identified. Electronically Signed   By: Maisie Fus  Register   On: 02/21/2021 10:31    Procedures Procedures (including critical care time)  Medications Ordered in UC Medications - No data to display  Initial Impression / Assessment and Plan / UC Course  I have reviewed the triage vital signs and the nursing notes.  Pertinent labs & imaging results that were available during my care of the patient were reviewed by me and considered in my medical decision making (see chart for details).     You will  need to see pulmonology for further follow up care  Your x ray is negative for pneumonia  Take the inahler and the albuterol as needed  If symptoms becomes worse you will need to go to there er  Will start on LABA to see if this will need but need to see pcp  Final Clinical Impressions(s) / UC Diagnoses   Final diagnoses:  Cough  Dyspnea on exertion  COPD exacerbation [facility])     Discharge Instructions  You will need to see pulmonology for further follow up care  Your x ray is negative for pneumonia  Take the inahler and the albuterol as needed  If symptoms becomes worse you will need to go to there er       ED Prescriptions     Medication Sig Dispense Auth. Provider   Tiotropium Bromide Monohydrate (SPIRIVA RESPIMAT) 1.25 MCG/ACT AERS Inhale 2 puffs into the lungs daily. 4 g Coralyn Mark, NP      PDMP not reviewed this encounter.   Coralyn Mark, NP 02/21/21 1051

## 2021-02-23 ENCOUNTER — Telehealth: Payer: Self-pay | Admitting: Cardiovascular Disease

## 2021-02-23 NOTE — Telephone Encounter (Signed)
Pt c/o swelling: STAT is pt has developed SOB within 24 hours  If swelling, where is the swelling located? Both feet and ankles  How much weight have you gained and in what time span? N/A  Have you gained 3 pounds in a day or 5 pounds in a week? N/A  Do you have a log of your daily weights (if so, list)? N/A  Are you currently taking a fluid pill? No  Are you currently SOB? Yes  Have you traveled recently? No

## 2021-02-23 NOTE — Telephone Encounter (Signed)
Spoke with the patient's wife who states that the patient had worsening cough and shortness of breath. He had been on prednisone which helped but once the course was over his symptoms came back. He went back to urgent care on Wednesday and they prescribed him Spiriva. He has only taken one dose of it so far. She states that she thinks it may be working but is not sure. Patient's wife also reports that he is having increased swelling in his feet and ankles. This has been going on for a couple of weeks. The patient does have shortness of breath with exertion. Denies chest pain. He does not take his blood pressure at home and does not have any readings. Patient's wife states that he has not increase sodium in his diet. She reports he has a poor appetite and will mainly eat sweets. I advised her he needs to avoid sugar. She reports that he has not gained any weight and has lost weigh if anything. She is not sure if he is elevating his legs but states that most likely he is not. I advised her to have him elevate his feet as often as possible. Advised that I would send message to Dr. Duke Salvia for further recommendations.

## 2021-03-14 NOTE — Progress Notes (Signed)
Cardiology Office Note   Date:  03/15/2021   ID:  Joseph Tanner, DOB 10/28/1950, MRN [id number]  PCP:  Pcp, No  Cardiologist:   Chilton Siiffany Whitewood, MD   No chief complaint on file.    History of Present Illness: Joseph Tanner is a 70 y.o. male with chronic systolic and diastolic heart failure, CKD 3, CAD status post NSTEMI with PCI, hypertension, hyperlipidemia, and alcohol abuse who presents for follow-up.  Joseph Tanner, recently moved to [place] from [place].  He has a history of an NSTEMI and chronic systolic diastolic heart failure. This occurred around 2019.   He he initially did not have a heart cath.  Prior to leaving [place] he was hospitalized 09/2020 with recurrent NSTEMI and heart failure symptoms.  Had a left heart catheterization that revealed severe stenosis of a small left circumflex and mild to moderate stenosis in the RCA which was large and heavily calcified.  His LVEF was 25% in [place].  He reports that it has been this level for many years and that he has been offered an ICD but declined.  He [facility] to [facility] 11/20/2020 with acute on chronic systolic and diastolic heart failure.  BNP was 4500.  High-sensitivity troponin was mildly elevated to 54 and relatively flat.  He was diuresed and switched from BiDil to [place].  He was discharged on 4/29.  His discharge weight is unclear.  On the day prior to discharge he was 69.3 kg. On repeat labs his creatinine improved, so entresto was continued. Since his last visit, he was seen in the hospital twice for COPD exacerbations. High sensitivity troponin peaked at 72 in a pattern consistent with demand ischemia.  Today, he is accompanied by a family member. Overall, he feels "half and half," sometimes feeling like he progresses or regresses. At home, he does not check his blood pressure, and he reports not having any carvedilol since receiving some from his recent ED visit. He states he has "good  and bad days" regarding his breathing and shortness of breath. Sometimes he is able to get out of bed with no issue. Currently he endorses fluid retention in his lower extremities. He also reports having a craving for chalk, starting about 2 weeks ago. Also, three days after beginning entresto, he developed a flat cough, which then progressed to a "rough" cough that he describes as having a growl to it. Then it became a "nagging" cough and it seems to be lingering. He self-discontinued entresto, and then went to the ED a few days later. He was given an inhaler which seems to have improved his cough, but he remains short of breath. Of note, around the same time as starting entresto, he also moved into a new apartment with a strong paint smell. For his diet, he continues to watch his salt intake. Since moving, he has been eating more frozen meals rather than cooking. Typically, he does not drink much water. Usually he drinks a fruit mix or similar drinks. Lately he has cut back on alcohol consumption. He denies any palpitations, or chest pain. No lightheadedness, headaches, syncope, orthopnea, or PND.   Past Medical History:  Diagnosis Date   Hyperlipemia    Hypertension    NSTEMI (non-ST elevated myocardial infarction) (HCC)    Pure hypercholesterolemia 03/15/2021    History reviewed. No pertinent surgical history.   Current Outpatient Medications  Medication Sig Dispense Refill   albuterol (VENTOLIN HFA) 108 (90 Base) MCG/ACT inhaler  Inhale 2 puffs into the lungs every 6 (six) hours as needed for wheezing or shortness of breath.     aspirin EC 81 MG EC tablet Take 1 tablet (81 mg total) by mouth daily. Swallow whole.     dapagliflozin propanediol (FARXIGA) 10 MG TABS tablet Take 1 tablet (10 mg total) by mouth daily before breakfast. 90 tablet 3   furosemide (LASIX) 40 MG tablet Take 1 tablet (40 mg total) by mouth daily. 90 tablet 3   gabapentin (NEURONTIN) 100 MG capsule Take 100 mg by mouth  daily as needed (pain).     Multiple Vitamin (MULTIVITAMIN WITH MINERALS) TABS tablet Take 1 tablet by mouth daily.     Tiotropium Bromide Monohydrate (SPIRIVA RESPIMAT) 1.25 MCG/ACT AERS Inhale 2 puffs into the lungs daily. 4 g 1   atorvastatin (LIPITOR) 40 MG tablet Take 1 tablet (40 mg total) by mouth every evening. 90 tablet 3   carvedilol (COREG) 6.25 MG tablet Take 1 tablet (6.25 mg total) by mouth 2 (two) times daily. 180 tablet 3   No current facility-administered medications for this visit.    Allergies:   Patient has no known allergies.    Social History:  The patient  reports that he has been smoking. He has never used smokeless tobacco. He reports current alcohol use. He reports that he does not use drugs.   Family History:  The patient's family history includes Heart failure in his maternal grandfather, maternal grandmother, mother, paternal grandfather, and paternal grandmother.    ROS:   Please see the history of present illness. (+) Bilateral LE edema (+) Shortness of breath (+) "Nagging" cough (+) Craving for chalk All other systems are reviewed and negative.    PHYSICAL EXAM: VS:  BP 110/70   Pulse 75   Ht 6\' 3"  (1.905 m)   Wt 171 lb 6.4 oz (77.7 kg)   BMI 21.42 kg/m  , BMI Body mass index is 21.42 kg/m. GENERAL:  Well appearing HEENT:  Pupils equal round and reactive, fundi not visualized, oral mucosa unremarkable NECK:  No jugular venous distention, waveform within normal limits, carotid upstroke brisk and symmetric, no bruits LUNGS:  Clear to auscultation bilaterally. +bilateral crackles at the bases HEART:  RRR.  PMI not displaced or sustained,S1 and S2 within normal limits, no S3, no S4, no clicks, no rubs, no murmurs ABD:  Flat, positive bowel sounds normal in frequency in pitch, no bruits, no rebound, no guarding, no midline pulsatile mass, no hepatomegaly, no splenomegaly EXT:  2 plus pulses throughout, 2+ pitting edema to the knees bilaterally, no  cyanosis + clubbing SKIN:  No rashes no nodules NEURO:  Cranial nerves II through XII grossly intact, motor grossly intact throughout [facility]:  Cognitively intact, oriented to person place and time   EKG:   03/15/2021: EKG is not ordered today. 11/30/2020: EKG was not ordered.  Echo 11/21/2020: 1. Left ventricular ejection fraction, by estimation, is 20 to 25%. The  left ventricle has severely decreased function. The left ventricle  demonstrates global hypokinesis. The left ventricular internal cavity size  was moderately dilated. Left  ventricular diastolic parameters are consistent with Grade III diastolic  dysfunction (restrictive). Elevated left atrial pressure.   2. Right ventricular systolic function is mildly reduced. The right  ventricular size is mildly enlarged. There is normal pulmonary artery  systolic pressure.   3. Left atrial size was moderately dilated.   4. Right atrial size was severely dilated.   5. The mitral  valve is normal in structure. Trivial mitral valve  regurgitation.   6. The aortic valve is tricuspid. Aortic valve regurgitation is trivial.  No aortic stenosis is present.   7. The inferior vena cava is dilated in size with <50% respiratory  variability, suggesting right atrial pressure of 15 mmHg.  Left heart cath 10/17/2020:  Left ventricular dysfunction out of proportion to his coronary artery  disease   Severe stenosis of a small left circumflex, mild-to-moderate stenosis of  a large RCA   Elevated left ventricular end-diastolic filling pressure   Right radial loop   Successful Perclose closure of the right femoral artery   Nuclear stress 10/15/20: Marland Kitchen Moderately severe fixed defect of the apex and distal lateral wall. This is associated with akinesia, and this is  consistent with a myocardial scar from prior infarct.   2. No reversible defects identified that would be suspect for myocardium in jeopardy for ischemia.   3. Left ventricular chamber  is dilated at rest and stress.   4. Hypokinesia of the septum and inferior wall.   5. LVEF is markedly reduced, and determined to be 27%.   Echo 10/14/20; Summary Statements  The left ventricle is mildly dilated.  There is mild concentric left ventricular hypertrophy.  Left ventricular systolic function is severely reduced.  Ejection Fraction = <25%.  There is severe global hypokinesis of the left ventricle.  The right ventricle is grossly normal size.  The right ventricular systolic function is moderate to severely reduced.  The left atrium is mildly dilated.  Borderline right atrial enlargement.  There is no pericardial effusion.  Grade I diastolic dysfunction.   Recent Labs: 11/20/2020: ALT 18; B Natriuretic Peptide >4,500.0 02/03/2021: BUN 19; Creatinine, Ser 1.58; Hemoglobin 12.5; Platelets 131; Potassium 3.5; Sodium 138    Lipid Panel    Component Value Date/Time   CHOL 123 11/23/2020 0344   TRIG 138 11/23/2020 0344   HDL 39 (L) 11/23/2020 0344   CHOLHDL 3.2 11/23/2020 0344   VLDL 28 11/23/2020 0344   LDLCALC 56 11/23/2020 0344      Wt Readings from Last 3 Encounters:  03/15/21 171 lb 6.4 oz (77.7 kg)  11/30/20 162 lb 6.4 oz (73.7 kg)  11/23/20 152 lb 12.5 oz (69.3 kg)      ASSESSMENT AND PLAN: Coronary artery disease Left heart cath 09/2020 revealed severe stenosis of a small left circumflex and mild to moderate stenosis of his RCA.  It was not amenable to PCI.  This is post be medically managed but he has been out of his medications for months.  He will restart aspirin, atorvastatin, and carvedilol.  Fortunately he is not having any angina.  Chronic combined systolic and diastolic heart failure (HCC) LVEF 20 to 25%.  He has been out of all of his medications for a while.  He reported cough which he attributed to [facility], however I am not sure that it was not actually a COPD exacerbation.  For now his blood pressure is low we will not really try Entresto.  He will  start back taking carvedilol 6.25 mg twice daily.  We will also diurese him with Lasix 40 mg daily and start Farxiga 10 mg a day.  Check a basic metabolic panel in a week.  We will also check a BNP at that time.  We discussed the importance of limiting fluids to 1.5 L daily and minimizing his sodium intake.  He is going to start weighing himself each morning.  His  weight is up 20 pounds from discharge.  He understands that should he have any issues filling his medications in the future he should call our office.  Ultimately, he will need an ICD assessment.  However he has not yet been on 3 months of guideline directed medical therapy.  We will get an echo after he has been on therapy for 3 months.  He is also continuing to abstain from alcohol.  CKD (chronic kidney disease) stage 3, GFR 30-59 ml/min (HCC) He had acute renal failure in the hospital that resolved.  [name] has been held due to cough.  We will reconsider in the future.  Start [place] as above.  Check a basic metabolic panel in a week given that we are starting Farxiga and Lasix.  Pure hypercholesterolemia LDL goal is less than 70.  Restart atorvastatin.  Current medicines are reviewed at length with the patient today.  The patient does not have concerns regarding medicines.  The following changes have been made:  no change    Labs/ tests ordered today include:   Orders Placed This Encounter  Procedures   B Nat Peptide   Basic metabolic panel      Disposition:    FU with APP in 2 weeks. FU with Kamdon Reisig C. Duke Salvia, MD, [facility] in 2 months.  I,Mathew Stumpf,acting as a Neurosurgeon for [name], MD.,have documented all relevant documentation on the behalf of [name], MD,as directed by  [name], MD while in the presence of [name], MD.  I, Guillermina Shaft C. Duke Salvia, MD have reviewed all documentation for this visit.  The documentation of the exam, diagnosis, procedures, and orders on 03/15/2021 are all  accurate and complete.   Signed, Jeziah Kretschmer C. Duke Salvia, MD, [facility]  03/15/2021 12:58 PM    [facility]

## 2021-03-15 ENCOUNTER — Other Ambulatory Visit: Payer: Self-pay

## 2021-03-15 ENCOUNTER — Ambulatory Visit (INDEPENDENT_AMBULATORY_CARE_PROVIDER_SITE_OTHER): Payer: Medicare HMO | Admitting: Cardiovascular Disease

## 2021-03-15 ENCOUNTER — Encounter (HOSPITAL_BASED_OUTPATIENT_CLINIC_OR_DEPARTMENT_OTHER): Payer: Self-pay | Admitting: Cardiovascular Disease

## 2021-03-15 VITALS — BP 110/70 | HR 75 | Ht 75.0 in | Wt 171.4 lb

## 2021-03-15 DIAGNOSIS — N1832 Chronic kidney disease, stage 3b: Secondary | ICD-10-CM

## 2021-03-15 DIAGNOSIS — I5042 Chronic combined systolic (congestive) and diastolic (congestive) heart failure: Secondary | ICD-10-CM

## 2021-03-15 DIAGNOSIS — R0602 Shortness of breath: Secondary | ICD-10-CM | POA: Diagnosis not present

## 2021-03-15 DIAGNOSIS — I2510 Atherosclerotic heart disease of native coronary artery without angina pectoris: Secondary | ICD-10-CM | POA: Diagnosis not present

## 2021-03-15 DIAGNOSIS — Z5181 Encounter for therapeutic drug level monitoring: Secondary | ICD-10-CM

## 2021-03-15 DIAGNOSIS — E7800 Pure hypercholesterolemia, unspecified: Secondary | ICD-10-CM | POA: Insufficient documentation

## 2021-03-15 HISTORY — DX: Pure hypercholesterolemia, unspecified: E78.00

## 2021-03-15 MED ORDER — CARVEDILOL 6.25 MG PO TABS: 6.2500 mg | ORAL_TABLET | Freq: Two times a day (BID) | ORAL | 3 refills | Status: DC

## 2021-03-15 MED ORDER — DAPAGLIFLOZIN PROPANEDIOL 10 MG PO TABS: 10.0000 mg | ORAL_TABLET | Freq: Every day | ORAL | 3 refills | Status: DC

## 2021-03-15 MED ORDER — FUROSEMIDE 40 MG PO TABS: 40.0000 mg | ORAL_TABLET | Freq: Every day | ORAL | 3 refills | Status: DC

## 2021-03-15 MED ORDER — ATORVASTATIN CALCIUM 40 MG PO TABS: 40.0000 mg | ORAL_TABLET | Freq: Every evening | ORAL | 3 refills | Status: DC

## 2021-03-15 NOTE — Telephone Encounter (Signed)
Patient seen in office today. 

## 2021-03-15 NOTE — Assessment & Plan Note (Signed)
LDL goal is less than 70.  Restart atorvastatin.

## 2021-03-15 NOTE — Assessment & Plan Note (Signed)
LVEF 20 to 25%.  He has been out of all of his medications for a while.  He reported cough which he attributed to [facility], however I am not sure that it was not actually a COPD exacerbation.  For now his blood pressure is low we will not really try Entresto.  He will start back taking carvedilol 6.25 mg twice daily.  We will also diurese him with Lasix 40 mg daily and start Farxiga 10 mg a day.  Check a basic metabolic panel in a week.  We will also check a BNP at that time.  We discussed the importance of limiting fluids to 1.5 L daily and minimizing his sodium intake.  He is going to start weighing himself each morning.  His weight is up 20 pounds from discharge.  He understands that should he have any issues filling his medications in the future he should call our office.  Ultimately, he will need an ICD assessment.  However he has not yet been on 3 months of guideline directed medical therapy.  We will get an echo after he has been on therapy for 3 months.  He is also continuing to abstain from alcohol.

## 2021-03-15 NOTE — Assessment & Plan Note (Signed)
Left heart cath 09/2020 revealed severe stenosis of a small left circumflex and mild to moderate stenosis of his RCA.  It was not amenable to PCI.  This is post be medically managed but he has been out of his medications for months.  He will restart aspirin, atorvastatin, and carvedilol.  Fortunately he is not having any angina.

## 2021-03-15 NOTE — Patient Instructions (Signed)
Medication Instructions:  START FARXIGA 10 MG DAILY   START FUROSEMIDE 40 MG DAILY   TAKE OTHER MEDICATIONS AS DIRECTED   *If you need a refill on your cardiac medications before your next appointment, please call your pharmacy*  Lab Work: BNP/BMET 1 WEEK   If you have labs (blood work) drawn today and your tests are completely normal, you will receive your results only by: MyChart Message (if you have MyChart) OR A paper copy in the mail If you have any lab test that is abnormal or we need to change your treatment, we will call you to review the results.  Testing/Procedures: NONE   Follow-Up: At [facility], you and your health needs are our priority.  As part of our continuing mission to provide you with exceptional heart care, we have created designated Provider Care Teams.  These Care Teams include your primary Cardiologist (physician) and Advanced Practice Providers (APPs -  Physician Assistants and Nurse Practitioners) who all work together to provide you with the care you need, when you need it.  We recommend signing up for the patient portal called "MyChart".  Sign up information is provided on this After Visit Summary.  MyChart is used to connect with patients for Virtual Visits (Telemedicine).  Patients are able to view lab/test results, encounter notes, upcoming appointments, etc.  Non-urgent messages can be sent to your provider as well.   To learn more about what you can do with MyChart, go to [email or web address].    Your next appointment:   2 month(s)  The format for your next appointment:   In Person  Provider:   [name], MD  Your physician recommends that you schedule a follow-up appointment in: 2 WEEKS WITH CAITLIN W NP   Other Instructions  WEIGH YOURSELF DAILY, LOG ON SHEET PROVIDED AND BRING TO FOLLOW UP

## 2021-03-15 NOTE — Assessment & Plan Note (Signed)
He had acute renal failure in the hospital that resolved.  Joseph Tanner has been held due to cough.  We will reconsider in the future.  Start [place] as above.  Check a basic metabolic panel in a week given that we are starting Farxiga and Lasix.

## 2021-03-20 ENCOUNTER — Institutional Professional Consult (permissible substitution): Payer: Medicare HMO | Admitting: Pulmonary Disease

## 2021-03-24 LAB — BASIC METABOLIC PANEL
BUN/Creatinine Ratio: 16 (ref 10–24)
BUN: 23 mg/dL (ref 8–27)
CO2: 25 mmol/L (ref 20–29)
Calcium: 8.1 mg/dL — ABNORMAL LOW (ref 8.6–10.2)
Chloride: 104 mmol/L (ref 96–106)
Creatinine, Ser: 1.43 mg/dL — ABNORMAL HIGH (ref 0.76–1.27)
Glucose: 80 mg/dL (ref 65–99)
Potassium: 3.1 mmol/L — ABNORMAL LOW (ref 3.5–5.2)
Sodium: 140 mmol/L (ref 134–144)
eGFR: 53 mL/min/{1.73_m2} — ABNORMAL LOW (ref >59)

## 2021-03-24 LAB — BRAIN NATRIURETIC PEPTIDE: BNP: 2735.3 pg/mL — ABNORMAL HIGH (ref 0.0–100.0)

## 2021-03-26 ENCOUNTER — Telehealth (HOSPITAL_BASED_OUTPATIENT_CLINIC_OR_DEPARTMENT_OTHER): Payer: Self-pay | Admitting: *Deleted

## 2021-03-26 NOTE — Telephone Encounter (Signed)
-----   Message from [name], MD sent at 03/25/2021  8:46 AM EDT ----- Heart failure labs are elevated.  Kidney function is abnormal but stable and even improving a little.  Recommend increasing his Lasix to twice a day at least until he follows up on 9/2.  He will need a repeat basic metabolic panel and BNP at that time.

## 2021-03-26 NOTE — Telephone Encounter (Signed)
Left message to call back  

## 2021-03-29 ENCOUNTER — Encounter: Payer: Self-pay | Admitting: *Deleted

## 2021-03-29 NOTE — Telephone Encounter (Signed)
Advised wife, ok per DPR  Will discuss further at appointment

## 2021-03-29 NOTE — Telephone Encounter (Signed)
This encounter was created in error - please disregard.

## 2021-03-29 NOTE — Telephone Encounter (Signed)
Pts wife returning your phone call.. please advise

## 2021-03-30 ENCOUNTER — Other Ambulatory Visit: Payer: Self-pay

## 2021-03-30 ENCOUNTER — Ambulatory Visit (HOSPITAL_BASED_OUTPATIENT_CLINIC_OR_DEPARTMENT_OTHER): Payer: Medicare HMO | Admitting: Family

## 2021-03-30 ENCOUNTER — Encounter (HOSPITAL_BASED_OUTPATIENT_CLINIC_OR_DEPARTMENT_OTHER): Payer: Self-pay | Admitting: Family

## 2021-03-30 VITALS — BP 96/60 | HR 64 | Ht 75.0 in | Wt 159.2 lb

## 2021-03-30 DIAGNOSIS — E876 Hypokalemia: Secondary | ICD-10-CM | POA: Diagnosis not present

## 2021-03-30 DIAGNOSIS — J449 Chronic obstructive pulmonary disease, unspecified: Secondary | ICD-10-CM

## 2021-03-30 DIAGNOSIS — E785 Hyperlipidemia, unspecified: Secondary | ICD-10-CM

## 2021-03-30 DIAGNOSIS — N1832 Chronic kidney disease, stage 3b: Secondary | ICD-10-CM | POA: Diagnosis not present

## 2021-03-30 DIAGNOSIS — I25118 Atherosclerotic heart disease of native coronary artery with other forms of angina pectoris: Secondary | ICD-10-CM

## 2021-03-30 DIAGNOSIS — I5042 Chronic combined systolic (congestive) and diastolic (congestive) heart failure: Secondary | ICD-10-CM | POA: Diagnosis not present

## 2021-03-30 DIAGNOSIS — Z789 Other specified health status: Secondary | ICD-10-CM

## 2021-03-30 DIAGNOSIS — F1090 Alcohol use, unspecified, uncomplicated: Secondary | ICD-10-CM

## 2021-03-30 DIAGNOSIS — Z7289 Other problems related to lifestyle: Secondary | ICD-10-CM | POA: Diagnosis not present

## 2021-03-30 MED ORDER — POTASSIUM CHLORIDE CRYS ER 20 MEQ PO TBCR: 20.0000 meq | EXTENDED_RELEASE_TABLET | Freq: Every day | ORAL | 3 refills | Status: DC

## 2021-03-30 MED ORDER — CARVEDILOL 3.125 MG PO TABS: 3.1250 mg | ORAL_TABLET | Freq: Two times a day (BID) | ORAL | 1 refills | Status: DC
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
## 2021-03-30 NOTE — Patient Instructions (Addendum)
Medication Instructions:  Your physician has recommended you make the following change in your medication:   REDUCE Carvedilol to 3.125mg  twice daily  CONTINUE Furosemide (Lasix) 40mg  daily  START Potassium Potassium two tablets Saturday and Sunday  Then Monday start one tablet Tuesday daily  *If you need a refill on your cardiac medications before your next appointment, please call your pharmacy*   Lab Work: Your physician recommends that you return for lab work next week one day Wednesday-Friday at 11-[birth year]-1993 or on the 3rd floor of the [facility] location for BMP, BNP  If you have labs (blood work) drawn today and your tests are completely normal, you will receive your results only by: [employer] (if you have MyChart) OR A paper copy in the mail If you have any lab test that is abnormal or we need to change your treatment, we will call you to review the results.   Testing/Procedures: Your physician has requested that you have an echocardiogram in December 2022. Echocardiography is a painless test that uses sound waves to create images of your heart. It provides your doctor with information about the size and shape of your heart and how well your heart's chambers and valves are working. This procedure takes approximately one hour. There are no restrictions for this procedure.    Follow-Up: At [facility], you and your health needs are our priority.  As part of our continuing mission to provide you with exceptional heart care, we have created designated Provider Care Teams.  These Care Teams include your primary Cardiologist (physician) and Advanced Practice Providers (APPs -  Physician Assistants and Nurse Practitioners) who all work together to provide you with the care you need, when you need it.  We recommend signing up for the patient portal called "MyChart".  Sign up information is provided on this After Visit Summary.  MyChart is used to connect with patients for  Virtual Visits (Telemedicine).  Patients are able to view lab/test results, encounter notes, upcoming appointments, etc.  Non-urgent messages can be sent to your provider as well.   To learn more about what you can do with MyChart, go to [facility].    Your next appointment:   04/19/21 with 04/21/21, NP    Other Instructions  Heart Healthy Diet Recommendations: A low-salt diet is recommended. Meats should be grilled, baked, or boiled. Avoid fried foods. Focus on lean protein sources like fish or chicken with vegetables and fruits. The American Heart Association is a Alver Sorrow!    Exercise recommendations: The American Heart Association recommends 150 minutes of moderate intensity exercise weekly. Try 30 minutes of moderate intensity exercise 4-5 times per week. This could include walking, jogging, or swimming.   Recommend weighing daily and keeping a log. Please call our office if you have weight gain of 2 pounds overnight or 5 pounds in 1 week.   Date  Time Weight

## 2021-04-03 ENCOUNTER — Telehealth: Payer: Self-pay | Admitting: Licensed Clinical Social Worker

## 2021-04-03 NOTE — Telephone Encounter (Signed)
LCSW received referral to enroll pt in [employer]. LCSW has sent in that referral, will place Transportation Services information in mail for pt. Aware provider has assisted also with enrollment in [employer]).   Octavio Graves, MSW, LCSW [facility] Heart/Vascular Care Navigation  [phone]

## 2021-04-07 LAB — BASIC METABOLIC PANEL
BUN/Creatinine Ratio: 11 (ref 10–24)
BUN: 14 mg/dL (ref 8–27)
CO2: 24 mmol/L (ref 20–29)
Calcium: 8.2 mg/dL — ABNORMAL LOW (ref 8.6–10.2)
Chloride: 103 mmol/L (ref 96–106)
Creatinine, Ser: 1.29 mg/dL — ABNORMAL HIGH (ref 0.76–1.27)
Glucose: 67 mg/dL (ref 65–99)
Potassium: 3.8 mmol/L (ref 3.5–5.2)
Sodium: 141 mmol/L (ref 134–144)
eGFR: 60 mL/min/{1.73_m2} (ref >59)

## 2021-04-07 LAB — BRAIN NATRIURETIC PEPTIDE: BNP: >5000 pg/mL — AB (ref 0.0–100.0)

## 2021-04-11 ENCOUNTER — Telehealth: Payer: Self-pay | Admitting: Licensed Clinical Social Worker

## 2021-04-11 NOTE — Telephone Encounter (Signed)
Additional phone note opened in error.   Ellamae Lybeck, MSW, LCSW [place] Heart/Vascular Care Navigation  [phone]  
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
## 2021-04-17 ENCOUNTER — Telehealth (HOSPITAL_BASED_OUTPATIENT_CLINIC_OR_DEPARTMENT_OTHER): Payer: Self-pay | Admitting: Family

## 2021-04-17 NOTE — Telephone Encounter (Signed)
Received note from [employer] that patient assistance for Joseph Tanner was denied. Per fax received he should be covered under Medicaid.   Will ask triage team to reach out to him to ensure Joseph Tanner is affordable.   Alver Sorrow, NP

## 2021-04-17 NOTE — Telephone Encounter (Signed)
Routed to primary nurse 

## 2021-04-18 NOTE — Telephone Encounter (Signed)
Spoke with daughter Shanda Bumps, ok per DPR  Patients copay for 30 days is over $100 which is more than can afford  He will discuss with Luther Parody at visit tomorrow Jessica's sister will be bringing to visit  If need to reach [place] leave voicemail and she will call back

## 2021-04-19 ENCOUNTER — Other Ambulatory Visit: Payer: Self-pay

## 2021-04-19 ENCOUNTER — Ambulatory Visit (HOSPITAL_BASED_OUTPATIENT_CLINIC_OR_DEPARTMENT_OTHER): Payer: Medicare HMO | Admitting: Family

## 2021-04-19 ENCOUNTER — Encounter (HOSPITAL_BASED_OUTPATIENT_CLINIC_OR_DEPARTMENT_OTHER): Payer: Self-pay | Admitting: Family

## 2021-04-19 VITALS — BP 128/80 | HR 83 | Ht 75.0 in | Wt 169.0 lb

## 2021-04-19 DIAGNOSIS — J449 Chronic obstructive pulmonary disease, unspecified: Secondary | ICD-10-CM

## 2021-04-19 DIAGNOSIS — I10 Essential (primary) hypertension: Secondary | ICD-10-CM

## 2021-04-19 DIAGNOSIS — Z789 Other specified health status: Secondary | ICD-10-CM

## 2021-04-19 DIAGNOSIS — I25118 Atherosclerotic heart disease of native coronary artery with other forms of angina pectoris: Secondary | ICD-10-CM | POA: Diagnosis not present

## 2021-04-19 DIAGNOSIS — I5042 Chronic combined systolic (congestive) and diastolic (congestive) heart failure: Secondary | ICD-10-CM | POA: Diagnosis not present

## 2021-04-19 DIAGNOSIS — E785 Hyperlipidemia, unspecified: Secondary | ICD-10-CM

## 2021-04-19 DIAGNOSIS — N1832 Chronic kidney disease, stage 3b: Secondary | ICD-10-CM

## 2021-04-19 DIAGNOSIS — Z7289 Other problems related to lifestyle: Secondary | ICD-10-CM

## 2021-04-19 DIAGNOSIS — R5383 Other fatigue: Secondary | ICD-10-CM

## 2021-04-19 DIAGNOSIS — E876 Hypokalemia: Secondary | ICD-10-CM

## 2021-04-19 MED ORDER — SPIRONOLACTONE 25 MG PO TABS: 12.5000 mg | ORAL_TABLET | Freq: Every day | ORAL | 2 refills | Status: DC

## 2021-04-19 MED ORDER — FUROSEMIDE 40 MG PO TABS: 40.0000 mg | ORAL_TABLET | Freq: Two times a day (BID) | ORAL | 2 refills | Status: DC

## 2021-04-19 MED ORDER — CARVEDILOL 3.125 MG PO TABS: 3.1250 mg | ORAL_TABLET | Freq: Every day | ORAL | 1 refills | Status: DC

## 2021-04-19 MED ORDER — POTASSIUM CHLORIDE CRYS ER 20 MEQ PO TBCR: 20.0000 meq | EXTENDED_RELEASE_TABLET | Freq: Every day | ORAL | 2 refills | Status: DC

## 2021-04-19 NOTE — Progress Notes (Signed)
Office Visit    Patient Name: Joseph Tanner Date of Encounter: 04/19/2021  PCP:  Aviva Kluver   [facility]  Cardiologist:  [name], MD  Advanced Practice Provider:  No care team member to display Electrophysiologist:  None    Chief Complaint    Joseph Tanner is a 70 y.o. male with a hx of chronic combined heart failure, CKD3, CAd s/p NSTEMI with PCI, HTN, HLD, alcohol abuse presents today for heart failure follow up   Past Medical History    Past Medical History:  Diagnosis Date   Hyperlipemia    Hypertension    NSTEMI (non-ST elevated myocardial infarction) (HCC)    Pure hypercholesterolemia 03/15/2021   No past surgical history on file.  Allergies  No Known Allergies  History of Present Illness    Joseph Tanner is a 70 y.o. male with a hx of chronic combined heart failure, CKD3, COPD, CAD s/p NSTEMI with PCI, HTN, HLD, alcohol abuse last seen 03/30/21  Previous cardiac workup in [place]. Diagnosed with NSTEMI and combined systolic and diastolic heart failure around 0962. Hospitalized in [place] 09/2020 with recurrent NSTEMI and HF symptoms. LHC with severe stenosis of LCx an dmild to moderate stenosis in RCA which was large and heavily calcified. He has previously declined ICD. Presented to [facility] 10/2020 with acute on chronic heart failure BNP 4500 HS troponin 54 and trended flat. His Bidil was transitioned to [place].   He was seen 03/15/21 by Dr. Duke Salvia up 20 pounds from hospital discharge. He noted "cough" which he attributed to [facility] and self-discontinued. Of note, moved into apartment with strong paint smell at same time. His Coreg was resumed, reocmmended for Lasix 40mg  daily, and initiated.   Seen 03/30/21 weight down 12 pounds from clinic visit 2 weeks ago.  Blood pressure was low in clinic with associated lightheadedness.  Edema overall the same.  He was still up 8 pounds and as such present dose  of Lasix was continued.  05/30/21 was started and 30-day coupon provided. His Coreg was reduced to  3.125mg  BID due to lightheadedness. Labs 04/06/21 with elevated BNP and Lasix increased to 40mg  BID x 3 days with potassium 06/06/21 BID x 3 days.  He presents today for follow up with his daughter . Weight is up 10 pounds compared to clinic visit 3 weeks ago. Tells me since last seen he has had his good and bad days. Tells me right after he saw me last time he was swelling in his bilateral lower extremities. He just started the increased dose of Lasix 3 days ago. He is weighing at home and fluctuating 2-3 pounds every other day. Not writing down and encouraged to do so. Tells me he is in the process of applying for Medicaid - he previously applied in but is reapplying for [facility] and is working with representative. Has about 2 more boxes of [place]. He was denied patient assistance due to potential Medicaid eligibility. The medication will be $47 per month and he tells me this is not possible for his budget presently.   EKGs/Labs/Other Studies Reviewed:   The following studies were reviewed today:   EKG:  No EKG today.   Recent Labs: 11/20/2020: ALT 18 02/03/2021: Hemoglobin 12.5; Platelets 131 04/06/2021: BNP >5000.0; BUN 14; Creatinine, Ser 1.29; Potassium 3.8; Sodium 141  Recent Lipid Panel    Component Value Date/Time   CHOL 123 11/23/2020 0344   TRIG 138  11/23/2020 0344   HDL 39 (L) 11/23/2020 0344   CHOLHDL 3.2 11/23/2020 0344   VLDL 28 11/23/2020 0344   LDLCALC 56 11/23/2020 0344     Home Medications   No outpatient medications have been marked as taking for the 04/19/21 encounter (Appointment) with Alver Sorrow, NP.     Review of Systems      All other systems reviewed and are otherwise negative except as noted above.  Physical Exam    VS:  There were no vitals taken for this visit. , BMI There is no height or weight on file to calculate BMI.  Wt  Readings from Last 3 Encounters:  03/30/21 159 lb 3.2 oz (72.2 kg)  03/15/21 171 lb 6.4 oz (77.7 kg)  11/30/20 162 lb 6.4 oz (73.7 kg)    GEN: Well nourished, well developed, in no acute distress. HEENT: normal. Neck: Supple, no JVD, carotid bruits, or masses. Cardiac: RRR, no murmurs, rubs, or gallops. No clubbing, cyanosis. Bilateral LE with 2+ pitting edema.  Radials/PT 2+ and equal bilaterally.  Respiratory:  Respirations regular and unlabored, clear to auscultation bilaterally. GI: Soft, nontender, nondistended. MS: No deformity or atrophy. Skin: Warm and dry, no rash. Neuro:  Strength and sensation are intact. Psych: Normal affect.  Assessment & Plan    HFrEF / DOE / Fatigue -  NYHA II-III. Weight up 10 pounds over last 3 weeks. Previous cough with Entresto, though was insetting of likely COPD exacerbation. Likely cost prohibitive. GDMT includes Coreg, Lasix. Farxiga patient assistance denied due to possible qualification for Medicaid and he is working on application in [place] (previously applied in [place]). Will have him discontinue as cost prohibitive at this time. Start Spironolactone 12.5mg  QD today. Consider increasing dose at follow up. Repeat BMP, BNP, TSH in 1 week. Checking TSH as not checked by my review and does not fatigue. Increase Lasix to 40mg  BID. Continue Potassium daily. May have to reduce pending labs in 1 week.  <1.5L fluid restriction and low salt diet encouraged. Plan for repeat echo in December to reassess LVEF 3 months after GDMT.  Referred to cardiac rehab.   Hypokalemia - Careful monitoring. Normal by most recent labs. Repeat BMP in 1 week.  Alcohol/Tobacco use - Encouraged to continue to abstain.   CAD - LHC 09/2020 severe stenosis of small LCx and mild to moderate stenosis of RCA not amenable to PCI. Stable with no anginal symptoms. No indication for ischemic evaluation.  GDMT includes aspirin, atorvastatin, carvedilol. Heart healthy diet and regular  cardiovascular exercise encouraged.    HLD - Continue Atorvastatin.  CKD - Careful titration of diuretic and antihypertensive.    COPD - No signs of acute exacerbation. Recommend establishing with PCP.   Application completed for [facility] transportation to [facility] appointments.   Disposition: Follow up in October as scheduled with Dr. November.  Signed, Duke Salvia, NP 04/19/2021, 11:14 AM [facility]

## 2021-04-19 NOTE — Patient Instructions (Addendum)
Medication Instructions:  Your physician has recommended you make the following change in your medication:    CONTINUE Lasix 40mg  twice daily - take one tablet at 8 am and one tablet at 2pm  CHANGE Potassium once daily   START Spironolactone half tablet 12.5mg  daily  When you run out of Dapagliflozin ) simply stop. We will wait to resume until we have heard from [facility].   *If you need a refill on your cardiac medications before your next appointment, please call your pharmacy*   Lab Work: Your physician recommends that you return for lab work in 1 week (either 9/27, 9/28, or 9/29) for BMP, BNP, TSH  Your provider has recommended lab work. Please have this collected at [facility]. The lab is open 8:00 am - 4:30 pm. Please avoid 12:00p - 1:00p for lunch hour. You do not need an appointment. Please go to [address] [place], [place]. This is in the Primary Care office on the 3rd floor, let them know you are there for blood work and they will direct you to the lab.   If you have labs (blood work) drawn today and your tests are completely normal, you will receive your results only by: MyChart Message (if you have MyChart) OR A paper copy in the mail If you have any lab test that is abnormal or we need to change your treatment, we will call you to review the results.  Testing/Procedures: None ordered today.   Follow-Up: At [facility], you and your health needs are our priority.  As part of our continuing mission to provide you with exceptional heart care, we have created designated Provider Care Teams.  These Care Teams include your primary Cardiologist (physician) and Advanced Practice Providers (APPs -  Physician Assistants and Nurse Practitioners) who all work together to provide you with the care you need, when you need it.  We recommend signing up for the patient portal called "MyChart".  Sign up information is  provided on this After Visit Summary.  MyChart is used to connect with patients for Virtual Visits (Telemedicine).  Patients are able to view lab/test results, encounter notes, upcoming appointments, etc.  Non-urgent messages can be sent to your provider as well.   To learn more about what you can do with MyChart, go to [facility].    Your next appointment:   As scheduled with [email or web address] in October   Other Instructions  Call [facility] to make an appointment for nail trim: [phone]  Recommend weighing daily and keeping a log. Please call our office if you have weight gain of 2 pounds overnight or 5 pounds in 1 week.   Date  Weight                                    Heart Healthy Diet Recommendations: A low-salt diet is recommended. Meats should be grilled, baked, or boiled. Avoid fried foods. Focus on lean protein sources like fish or chicken with vegetables and fruits. The American Heart Association is a [phone]!    Exercise recommendations: The American Heart Association recommends 150 minutes of moderate intensity exercise weekly. Try 30 minutes of moderate intensity exercise 4-5 times per week. This could include walking, jogging, or swimming.    You have been referred to cardiac rehab. This is a combination program including monitored exercise, dietary education, and  support group. We strongly recommend participating in the program. Expect a phone call from them in approximately 2 weeks. There is a wait list so please be patient.

## 2021-04-19 NOTE — Telephone Encounter (Signed)
Discussed with patient during clinic visit.  He is in the process of applying for Medicaid and was denied for [place] patient assistance as medicaid is pending. He has had to switch his application from [place] to [place] since recent move.  We will defer resumption of Farxiga until Medicaid coverage is obtained.  Please see office note for additional details.  Alver Sorrow, NP

## 2021-04-20 ENCOUNTER — Encounter (HOSPITAL_COMMUNITY): Payer: Self-pay | Admitting: *Deleted

## 2021-04-20 NOTE — Progress Notes (Signed)
Received cardiac rehab referral for this pt to participate in cardiac rehab with the diagnosis of Chronic combined systolic and diastolic heart failure.  Pt most recent echo shows an EF 20-25%. Clinical review of pt follow up appt on 922 with [name] NP with Dr. Duke Salvia - cardiologist office note. Pt is making the expected progress in recovery.  Pt appropriate for scheduling when able as there is a wait list for on site cardiac rehab and/or enrollment in Virtual Cardiac Rehab.  Pt Covid Risk Score is 7.  Will forward to staff for follow up. Alanson Aly, BSN Cardiac and Emergency planning/management officer

## 2021-04-23 ENCOUNTER — Telehealth: Payer: Self-pay | Admitting: Cardiovascular Disease

## 2021-04-23 ENCOUNTER — Telehealth (HOSPITAL_BASED_OUTPATIENT_CLINIC_OR_DEPARTMENT_OTHER): Payer: Self-pay | Admitting: Cardiovascular Disease

## 2021-04-23 NOTE — Telephone Encounter (Signed)
Doctor, general practice [facility] to clarify Spironolactone prescription.   Reviewed with pharmacy that we are aware of both Spironolactone and Potassium supplement and monitoring levels carefully. They were appreciative of the call and will fill the Rx for the patient. No new Rx needed.   Alver Sorrow, NP

## 2021-04-23 NOTE — Telephone Encounter (Signed)
Returned call to patient's daughter, Shanda Bumps, who called to ensure that patient can start spironolactone. She states the pharmacist advised her to call our office prior to having the patient start the medication and she did not attend the office visit last week with her father so she wanted to be certain. I advised that the reason the pharmacist likely mentioned the interaction is because the pt is on potassium chloride and that he was advised to come in for lab work 1 week after starting spironolactone so that we can check his K+ level. Advised daughter that patient's K+ level has been low/normal in the past. She is aware of the instructions for patient to come to [facility] for lab in 1 week and that the orders are in place and will not be outdated. I advised her that we will call back next week with any necessary changes. She verbalized understanding and agreement with plan and thanked me for the call.

## 2021-04-23 NOTE — Telephone Encounter (Signed)
Noted. Thank you.   Alenna Russell S Zaccheus Edmister, NP  

## 2021-04-23 NOTE — Telephone Encounter (Signed)
*  STAT* If patient is at the pharmacy, call can be transferred to refill team.   1. Which medications need to be refilled? (please list name of each medication and dose if known)  new prescription forSpironolactone  2. Which pharmacy/location (including street and city if local pharmacy) is medication to be sent to? [facility] [place], [place], [place]  3. Do they need a 30 day or 90 day supply? 90 days and refills

## 2021-04-23 NOTE — Telephone Encounter (Signed)
New Message:      Daughter called and said when she picked up patient's Spironolactone, the pharmacist told her to check with Dr Duke Salvia. She said tht she was told that the Spironolactone could cause problems with some of the other medicine he is taking. Please leave a detailed voice message, if she does not answer.

## 2021-04-27 ENCOUNTER — Telehealth (HOSPITAL_COMMUNITY): Payer: Self-pay

## 2021-04-27 ENCOUNTER — Encounter (HOSPITAL_COMMUNITY): Payer: Self-pay

## 2021-04-27 NOTE — Telephone Encounter (Deleted)
Pt insurance is active and benefits verified through [facility] Co-pay $45, DED 0/0 met, out of pocket $4,500/$2,647.72 met, co-insurance 0%. no pre-authorization required. Passport, 04/27/2021_0 :16pm, REF# [id number]   Will contact patient to see if he is interested in the Cardiac Rehab Program. If interested, patient will need to complete follow up appt. Once completed, patient will be contacted for scheduling upon review by the RN Navigator.

## 2021-04-27 NOTE — Telephone Encounter (Signed)
Attempted to call patient in regards to Cardiac Rehab - LM on VM Mailed letter 

## 2021-04-27 NOTE — Telephone Encounter (Signed)
Pt insurance is active and benefits verified through [employer] Co-pay $45, DED 0/0 met, out of pocket $4,500/$2,647.72 met, co-insurance 0%. no pre-authorization required. Passport, 04/27/2021@4:16pm, REF# [id number]   Will contact patient to see if he is interested in the Cardiac Rehab Program. If interested, patient will need to complete follow up appt. Once completed, patient will be contacted for scheduling upon review by the RN Navigator. 

## 2021-05-05 LAB — BASIC METABOLIC PANEL
BUN/Creatinine Ratio: 7 — ABNORMAL LOW (ref 10–24)
BUN: 12 mg/dL (ref 8–27)
CO2: 25 mmol/L (ref 20–29)
Calcium: 8.6 mg/dL (ref 8.6–10.2)
Chloride: 98 mmol/L (ref 96–106)
Creatinine, Ser: 1.78 mg/dL — ABNORMAL HIGH (ref 0.76–1.27)
Glucose: 114 mg/dL — ABNORMAL HIGH (ref 70–99)
Potassium: 3.8 mmol/L (ref 3.5–5.2)
Sodium: 142 mmol/L (ref 134–144)
eGFR: 41 mL/min/{1.73_m2} — ABNORMAL LOW (ref >59)

## 2021-05-05 LAB — TSH: TSH: 0.605 u[IU]/mL (ref 0.450–4.500)

## 2021-05-05 LAB — BRAIN NATRIURETIC PEPTIDE: BNP: >5000 pg/mL — AB (ref 0.0–100.0)

## 2021-05-10 ENCOUNTER — Telehealth: Payer: Self-pay | Admitting: Nurse Practitioner

## 2021-05-10 MED ORDER — FUROSEMIDE 40 MG PO TABS: ORAL_TABLET | ORAL | 3 refills | Status: DC

## 2021-05-10 NOTE — Telephone Encounter (Signed)
-----   Message from Alver Sorrow, NP sent at 05/07/2021  7:54 AM EDT ----- Normal thyroid. BNP still shows significant volume overload. Normal potassium. Kidney function slightly worse than baseline. Recommend adjust Lasix to alternate taking 40mg  daily and twice daily every other day. Continue Spironolactone and Potassium supplement at present dose. Follow up as scheduled and will plan to do labs at that time.

## 2021-05-10 NOTE — Telephone Encounter (Signed)
Lab results and plan of care reviewed with patient's daughter Shanda Bumps per DPR. She agrees to increase patient's furosemide to 40 mg daily alternating with 40 mg every other day and is aware to continue spironolactone and potassium chloride as previously prescribed. She is aware of follow-up appointment on 10/27 with Dr. Duke Salvia and thanked me for the call.

## 2021-05-11 NOTE — Telephone Encounter (Signed)
No repsonse from pt.  Closed referral  

## 2021-05-24 ENCOUNTER — Ambulatory Visit (INDEPENDENT_AMBULATORY_CARE_PROVIDER_SITE_OTHER): Payer: Medicare HMO | Admitting: Cardiovascular Disease

## 2021-05-24 ENCOUNTER — Encounter (HOSPITAL_BASED_OUTPATIENT_CLINIC_OR_DEPARTMENT_OTHER): Payer: Self-pay | Admitting: Cardiovascular Disease

## 2021-05-24 ENCOUNTER — Other Ambulatory Visit: Payer: Self-pay

## 2021-05-24 VITALS — BP 146/102 | HR 82 | Ht 75.0 in | Wt 155.1 lb

## 2021-05-24 DIAGNOSIS — Z5181 Encounter for therapeutic drug level monitoring: Secondary | ICD-10-CM

## 2021-05-24 DIAGNOSIS — I5042 Chronic combined systolic (congestive) and diastolic (congestive) heart failure: Secondary | ICD-10-CM

## 2021-05-24 DIAGNOSIS — I10 Essential (primary) hypertension: Secondary | ICD-10-CM

## 2021-05-24 DIAGNOSIS — R0602 Shortness of breath: Secondary | ICD-10-CM | POA: Diagnosis not present

## 2021-05-24 DIAGNOSIS — I2510 Atherosclerotic heart disease of native coronary artery without angina pectoris: Secondary | ICD-10-CM

## 2021-05-24 LAB — BASIC METABOLIC PANEL
BUN/Creatinine Ratio: 10 (ref 10–24)
BUN: 14 mg/dL (ref 8–27)
CO2: 22 mmol/L (ref 20–29)
Calcium: 8.8 mg/dL (ref 8.6–10.2)
Chloride: 101 mmol/L (ref 96–106)
Creatinine, Ser: 1.4 mg/dL — ABNORMAL HIGH (ref 0.76–1.27)
Glucose: 84 mg/dL (ref 70–99)
Potassium: 4.2 mmol/L (ref 3.5–5.2)
Sodium: 140 mmol/L (ref 134–144)
eGFR: 54 mL/min/{1.73_m2} — ABNORMAL LOW (ref >59)

## 2021-05-24 MED ORDER — ENTRESTO 24-26 MG PO TABS: ORAL_TABLET | ORAL | 5 refills | Status: DC

## 2021-05-24 MED ORDER — FUROSEMIDE 40 MG PO TABS: 40.0000 mg | ORAL_TABLET | Freq: Every day | ORAL | 3 refills | Status: DC

## 2021-05-24 NOTE — Assessment & Plan Note (Addendum)
LVEF 20-25%.  He has non-ischemic cardiomyopathy.  He is willing to try Entresto again.  I suspect that his prior cough was more related to heart failure and volume overload than the medication.  He will start Entresto 12/13.5mg  bid. Continue spironolactone, carvedilol, furosemide, and [place].  His volume status is much better.  He will get a BMP today and a BNP/BMP in one week.  After three months on this regimen he will need a repeat echo to assess for need for ICD.

## 2021-05-24 NOTE — Patient Instructions (Addendum)
Medication Instructions:  START ENTRESTO 24/26 MG 1/2 TABLET TWICE A DAY   TAKE FUROSEMIDE 40 MG DAILY   *If you need a refill on your cardiac medications before your next appointment, please call your pharmacy*  Lab Work: BMET TODAY   BNP/BMET IN 1 WEEK   If you have labs (blood work) drawn today and your tests are completely normal, you will receive your results only by: MyChart Message (if you have MyChart) OR A paper copy in the mail If you have any lab test that is abnormal or we need to change your treatment, we will call you to review the results.  Testing/Procedures: Your physician has requested that you have an echocardiogram. Echocardiography is a painless test that uses sound waves to create images of your heart. It provides your doctor with information about the size and shape of your heart and how well your heart's chambers and valves are working. This procedure takes approximately one hour. There are no restrictions for this procedure.  IN 3 MONTHS   Follow-Up: At [facility], you and your health needs are our priority.  As part of our continuing mission to provide you with exceptional heart care, we have created designated Provider Care Teams.  These Care Teams include your primary Cardiologist (physician) and Advanced Practice Providers (APPs -  Physician Assistants and Nurse Practitioners) who all work together to provide you with the care you need, when you need it.  We recommend signing up for the patient portal called "MyChart".  Sign up information is provided on this After Visit Summary.  MyChart is used to connect with patients for Virtual Visits (Telemedicine).  Patients are able to view lab/test results, encounter notes, upcoming appointments, etc.  Non-urgent messages can be sent to your provider as well.   To learn more about what you can do with MyChart, go to [email or web address].    Your next appointment:   3 month(s)  The format for your next  appointment:   In Person  Provider:   [name], MD

## 2021-05-24 NOTE — Progress Notes (Signed)
Cardiology Office Note   Date:  05/24/2021   ID:  Joseph Tanner, DOB Sep 25, 1950, MRN [id number]  PCP:  Pcp, No  Cardiologist:   [name], MD   No chief complaint on file.    History of Present Illness: Joseph Tanner is a 70 y.o. male with chronic systolic and diastolic heart failure, CKD 3, CAD status post NSTEMI with PCI, hypertension, hyperlipidemia, and alcohol abuse who presents for follow-up.  Mr. Joseph Tanner, recently moved to [place] from [place].  He has a history of an NSTEMI and chronic systolic diastolic heart failure. This occurred around 2019. He initially did not have a heart cath.  Prior to leaving [place] he was hospitalized 09/2020 with recurrent NSTEMI and heart failure symptoms.  Had a left heart catheterization that revealed severe stenosis of a small left circumflex and mild to moderate stenosis in the RCA which was large and heavily calcified.  His LVEF was 25% in [place].  He reports that it has been this level for many years and that he has been offered an ICD but declined.  He presented to [facility]/2020 with acute on chronic systolic and diastolic heart failure.  BNP was 4500.  High-sensitivity troponin was mildly elevated to 54 and relatively flat.  He was diuresed and switched from BiDil to [place].  He was discharged on 4/29.  His discharge weight is unclear.  On the day prior to discharge he was 69.3 kg. On repeat labs his creatinine improved, so entresto was continued. Since his last visit, he was seen in the hospital twice for COPD exacerbations. High sensitivity troponin peaked at 72 in a pattern consistent with demand ischemia.  He self-discontinued Entresto, and then went to the ED a few days later. He was given an inhaler which seems to have improved his cough, but he remains short of breath. Of note, around the same time as starting entresto, he also moved into a new apartment with a strong paint smell. When he  was seen 02/2021 he was out of all his medicines. He was started back on carvedilol, lasix, and farxiga. He followed up with [name], NP 03/2021 and his weight was down 12 lbs, but he was still 8 lbs over his dry weight. He followed up later that month, and had not been taking Lasix as prescribed. He was started on spironolactone and referred to cardiac rehab. He had a BNP 04/2021 that was over 5,000.  Today, he is accompanied by his daughter.  Lately he has been feeling well. On some days he feels much improved overall. Once or twice in the past 3 months he felt upper chest pain while lying down, but he believes this may have been caused by muscular movement or tightness.  At home his blood pressure has ranged 135-138/78-92 averaging 130/89. His heart rate is averaging 60-70 bpm. In clinic today his blood pressure is elevated. However, he notes having sneezing and coughing spasms just prior to this appointment. Since beginning furosemide he has noticed increased urine flow. He denies any palpitations, or shortness of breath. No lightheadedness, headaches, syncope, orthopnea, PND, lower extremity edema or exertional symptoms.   Past Medical History:  Diagnosis Date   Hyperlipemia    Hypertension    NSTEMI (non-ST elevated myocardial infarction) (HCC)    Pure hypercholesterolemia 03/15/2021    History reviewed. No pertinent surgical history.   Current Outpatient Medications  Medication Sig Dispense Refill   albuterol (VENTOLIN HFA) 108 (90  Base) MCG/ACT inhaler Inhale 2 puffs into the lungs every 6 (six) hours as needed for wheezing or shortness of breath.     aspirin EC 81 MG EC tablet Take 1 tablet (81 mg total) by mouth daily. Swallow whole.     atorvastatin (LIPITOR) 40 MG tablet Take 1 tablet (40 mg total) by mouth every evening. 90 tablet 3   carvedilol (COREG) 3.125 MG tablet Take 1 tablet (3.125 mg total) by mouth daily. 90 tablet 1   dapagliflozin propanediol (FARXIGA) 10 MG TABS  tablet Take 1 tablet (10 mg total) by mouth daily before breakfast. 90 tablet 3   gabapentin (NEURONTIN) 100 MG capsule Take 100 mg by mouth daily as needed (pain).     Multiple Vitamin (MULTIVITAMIN WITH MINERALS) TABS tablet Take 1 tablet by mouth daily.     potassium chloride SA (KLOR-CON) 20 MEQ tablet Take 1 tablet (20 mEq total) by mouth daily. 30 tablet 2   sacubitril-valsartan (ENTRESTO) 24-26 MG TAKE 1/2 TABLET TWICE A DAY 30 tablet 5   spironolactone (ALDACTONE) 25 MG tablet Take 0.5 tablets (12.5 mg total) by mouth daily. 15 tablet 2   Tiotropium Bromide Monohydrate (SPIRIVA RESPIMAT) 1.25 MCG/ACT AERS Inhale 2 puffs into the lungs daily. (Patient taking differently: Inhale 2 puffs into the lungs as needed.) 4 g 1   furosemide (LASIX) 40 MG tablet Take 1 tablet (40 mg total) by mouth daily. 135 tablet 3   No current facility-administered medications for this visit.    Allergies:   Patient has no known allergies.    Social History:  The patient  reports that he has been smoking. He has never used smokeless tobacco. He reports current alcohol use. He reports that he does not use drugs.   Family History:  The patient's family history includes Heart failure in his maternal grandfather, maternal grandmother, mother, paternal grandfather, and paternal grandmother.    ROS:   Please see the history of present illness. (+) Upper chest pain All other systems are reviewed and negative.    PHYSICAL EXAM: VS:  BP (!) 146/102 (BP Location: Right Arm, Patient Position: Sitting, Cuff Size: Normal)   Pulse 82   Ht 6\' 3"  (1.905 m)   Wt 155 lb 1.6 oz (70.4 kg)   SpO2 99%   BMI 19.39 kg/m  , BMI Body mass index is 19.39 kg/m. GENERAL:  Well appearing HEENT:  Pupils equal round and reactive, fundi not visualized, oral mucosa unremarkable NECK:  No jugular venous distention, waveform within normal limits, carotid upstroke brisk and symmetric, no bruits LUNGS:  Clear to auscultation  bilaterally. No crackles, rhonchi or wheezes HEART:  RRR.  PMI not displaced or sustained,S1 and S2 within normal limits, no S3, no S4, no clicks, no rubs, no murmurs ABD:  Flat, positive bowel sounds normal in frequency in pitch, no bruits, no rebound, no guarding, no midline pulsatile mass, no hepatomegaly, no splenomegaly EXT:  2 plus pulses throughout, trace edema. No cyanosis, + clubbing SKIN:  No rashes no nodules NEURO:  Cranial nerves II through XII grossly intact, motor grossly intact throughout PSYCH:  Cognitively intact, oriented to person place and time   EKG:   05/24/2021: EKG was not ordered today. 03/15/2021: EKG was not ordered. 11/30/2020: EKG was not ordered.  Echo 11/21/2020: 1. Left ventricular ejection fraction, by estimation, is 20 to 25%. The  left ventricle has severely decreased function. The left ventricle  demonstrates global hypokinesis. The left ventricular internal cavity size  was moderately dilated. Left  ventricular diastolic parameters are consistent with Grade III diastolic  dysfunction (restrictive). Elevated left atrial pressure.   2. Right ventricular systolic function is mildly reduced. The right  ventricular size is mildly enlarged. There is normal pulmonary artery  systolic pressure.   3. Left atrial size was moderately dilated.   4. Right atrial size was severely dilated.   5. The mitral valve is normal in structure. Trivial mitral valve  regurgitation.   6. The aortic valve is tricuspid. Aortic valve regurgitation is trivial.  No aortic stenosis is present.   7. The inferior vena cava is dilated in size with <50% respiratory  variability, suggesting right atrial pressure of 15 mmHg.  Left heart cath 10/17/2020:  Left ventricular dysfunction out of proportion to his coronary artery  disease   Severe stenosis of a small left circumflex, mild-to-moderate stenosis of  a large RCA   Elevated left ventricular end-diastolic filling pressure    Right radial loop   Successful Perclose closure of the right femoral artery   Nuclear stress 10/15/20: Marland Kitchen Moderately severe fixed defect of the apex and distal lateral wall. This is associated with akinesia, and this is  consistent with a myocardial scar from prior infarct.   2. No reversible defects identified that would be suspect for myocardium in jeopardy for ischemia.   3. Left ventricular chamber is dilated at rest and stress.   4. Hypokinesia of the septum and inferior wall.   5. LVEF is markedly reduced, and determined to be 27%.   Echo 10/14/20; Summary Statements  The left ventricle is mildly dilated.  There is mild concentric left ventricular hypertrophy.  Left ventricular systolic function is severely reduced.  Ejection Fraction = <25%.  There is severe global hypokinesis of the left ventricle.  The right ventricle is grossly normal size.  The right ventricular systolic function is moderate to severely reduced.  The left atrium is mildly dilated.  Borderline right atrial enlargement.  There is no pericardial effusion.  Grade I diastolic dysfunction.   Recent Labs: 11/20/2020: ALT 18 02/03/2021: Hemoglobin 12.5; Platelets 131 05/04/2021: BNP >5000.0; BUN 12; Creatinine, Ser 1.78; Potassium 3.8; Sodium 142; TSH 0.605    Lipid Panel    Component Value Date/Time   CHOL 123 11/23/2020 0344   TRIG 138 11/23/2020 0344   HDL 39 (L) 11/23/2020 0344   CHOLHDL 3.2 11/23/2020 0344   VLDL 28 11/23/2020 0344   LDLCALC 56 11/23/2020 0344      Wt Readings from Last 3 Encounters:  05/24/21 155 lb 1.6 oz (70.4 kg)  04/19/21 169 lb (76.7 kg)  03/30/21 159 lb 3.2 oz (72.2 kg)      ASSESSMENT AND PLAN: Chronic combined systolic and diastolic heart failure (HCC) LVEF 20-25%.  He has non-ischemic cardiomyopathy.  He is willing to try Entresto again.  I suspect that his prior cough was more related to heart failure and volume overload than the medication.  He will start Entresto  12/13.5mg  bid. Continue spironolactone, carvedilol, furosemide, and [place].  His volume status is much better.  He will get a BMP today and a BNP/BMP in one week.  After three months on this regimen he will need a repeat echo to assess for need for ICD.   Coronary artery disease S/p PCI.  He has no angina and is doing well.  Continue aspirin, carvedilol, and atorvastatin.  Current medicines are reviewed at length with the patient today.  The patient does not have  concerns regarding medicines.  The following changes have been made: resume Entresto    Labs/ tests ordered today include:   Orders Placed This Encounter  Procedures   Basic metabolic panel   Basic metabolic panel   B Nat Peptide   ECHOCARDIOGRAM COMPLETE    Time spent: 50 minutes-Greater than 50% of this time was spent in counseling, explanation of diagnosis, planning of further management, and coordination of care.    Disposition:    FU with Lilias Lorensen C. Duke Salvia, MD, [facility] in 3 months following repeat Echo.  I,Mathew Stumpf,acting as a Neurosurgeon for [name], MD.,have documented all relevant documentation on the behalf of [name], MD,as directed by  [name], MD while in the presence of [name], MD.  I, Tayona Sarnowski C. Duke Salvia, MD have reviewed all documentation for this visit.  The documentation of the exam, diagnosis, procedures, and orders on 05/24/2021 are all accurate and complete.   Signed, Jashay Roddy C. Duke Salvia, MD, [facility]  05/24/2021 8:22 PM    [facility]

## 2021-05-24 NOTE — Assessment & Plan Note (Signed)
S/p PCI.  He has no angina and is doing well.  Continue aspirin, carvedilol, and atorvastatin.

## 2021-08-21 ENCOUNTER — Telehealth: Payer: Self-pay | Admitting: Cardiovascular Disease

## 2021-08-21 ENCOUNTER — Ambulatory Visit (INDEPENDENT_AMBULATORY_CARE_PROVIDER_SITE_OTHER): Payer: HMO

## 2021-08-21 ENCOUNTER — Other Ambulatory Visit: Payer: Self-pay

## 2021-08-21 DIAGNOSIS — I5042 Chronic combined systolic (congestive) and diastolic (congestive) heart failure: Secondary | ICD-10-CM | POA: Diagnosis not present

## 2021-08-21 DIAGNOSIS — I10 Essential (primary) hypertension: Secondary | ICD-10-CM | POA: Diagnosis not present

## 2021-08-21 LAB — ECHOCARDIOGRAM COMPLETE
AR max vel: 2.75 cm2
AV Area VTI: 2.87 cm2
AV Area mean vel: 2.45 cm2
AV Mean grad: 2 mmHg
AV Peak grad: 3.9 mmHg
Ao pk vel: 0.99 m/s
Area-P 1/2: 6.12 cm2
Calc EF: 33.6 %
S' Lateral: 4.57 cm
Single Plane A2C EF: 31.8 %
Single Plane A4C EF: 30.5 %

## 2021-08-21 NOTE — Telephone Encounter (Signed)
° °  Pt c/o medication issue:  1. Name of Medication:   sacubitril-valsartan (ENTRESTO) 24-26 MG    2. How are you currently taking this medication (dosage and times per day)? TAKE 1/2 TABLET TWICE A DAY  3. Are you having a reaction (difficulty breathing--STAT)?   4. What is your medication issue? Pt would like to know how he can apply for pt assistance for this meds

## 2021-08-21 NOTE — Telephone Encounter (Signed)
° ° °*  STAT* If patient is at the pharmacy, call can be transferred to refill team.   1. Which medications need to be refilled? (please list name of each medication and dose if known) sacubitril-valsartan (ENTRESTO) 24-26 MG  2. Which pharmacy/location (including street and city if local pharmacy) is medication to be sent to? [facility] - [place], [place] - [address]  3. Do they need a 30 day or 90 day supply? 30 days

## 2021-08-22 ENCOUNTER — Telehealth (HOSPITAL_BASED_OUTPATIENT_CLINIC_OR_DEPARTMENT_OTHER): Payer: Self-pay

## 2021-08-22 NOTE — Telephone Encounter (Addendum)
Called patient no answer LM for patient to call back!      ----- Message from Alver Sorrow, NP sent at 08/21/2021  9:19 PM EST ----- Echocardiogram shows LVEF 25-30%. Mild thickness and stiffness of the heart muscle. Trivial leaking of mitral valve. 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   Heart pumping function still reduced. Continue current meds and follow up as scheduled with Dr. Duke Salvia. Recommend referral to EP for consideration of ICD.

## 2021-08-23 ENCOUNTER — Telehealth (HOSPITAL_BASED_OUTPATIENT_CLINIC_OR_DEPARTMENT_OTHER): Payer: Self-pay

## 2021-08-23 DIAGNOSIS — I5042 Chronic combined systolic (congestive) and diastolic (congestive) heart failure: Secondary | ICD-10-CM

## 2021-08-23 NOTE — Telephone Encounter (Addendum)
Patient's daughter returned call verbalizes understanding of results and is agreeable to EP Referral!    ----- Message from Loel Dubonnet, NP sent at 08/21/2021  9:19 PM EST ----- Echocardiogram shows LVEF 25-30%. Mild thickness and stiffness of the heart muscle. Trivial leaking of mitral valve. Ascending aorta dilated to 56mm.   Heart pumping function still reduced. Continue current meds and follow up as scheduled with Dr. Oval Linsey. Recommend referral to EP for consideration of ICD.

## 2021-08-23 NOTE — Telephone Encounter (Addendum)
Left message for patient to call back. 2nd attempt     ----- Message from Loel Dubonnet, NP sent at 08/21/2021  9:19 PM EST ----- Echocardiogram shows LVEF 25-30%. Mild thickness and stiffness of the heart muscle. Trivial leaking of mitral valve. Ascending aorta dilated to 6mm.   Heart pumping function still reduced. Continue current meds and follow up as scheduled with Dr. Oval Linsey. Recommend referral to EP for consideration of ICD.

## 2021-08-27 NOTE — Progress Notes (Signed)
Cardiology Office Note   Date:  08/29/2021   ID:  Joseph Tanner, DOB 09/23/1950, MRN [id number]  PCP:  Pcp, No  Cardiologist:   Chilton Siiffany Alexander, MD   No chief complaint on file.    History of Present Illness: Joseph Tanner is a 71 y.o. male with chronic systolic and diastolic heart failure, CKD 3, CAD status post NSTEMI with PCI, hypertension, hyperlipidemia, and alcohol abuse who presents for follow-up.  Joseph Tanner, recently moved to [place] from [place].  He has a history of an NSTEMI and chronic systolic diastolic heart failure. This occurred around 2019. He initially did not have a heart cath.  Prior to leaving [place] he was hospitalized 09/2020 with recurrent NSTEMI and heart failure symptoms.  Had a left heart catheterization that revealed severe stenosis of a small left circumflex and mild to moderate stenosis in the RCA which was large and heavily calcified.  His LVEF was 25% in [place].  He reports that it has been this level for many years and that he has been offered an ICD but declined.  [facility]/20/2020 with acute on chronic systolic and diastolic heart failure.  BNP was 4500.  High-sensitivity troponin was mildly elevated to 54 and relatively flat.  He was diuresed and switched from BiDil [place].  He was discharged on 4/29.  His discharge weight is unclear.  On the day prior to discharge he was 69.3 kg. On repeat labs his creatinine improved, so entresto was continued. Since his last visit, he was seen in the hospital twice for COPD exacerbations. High sensitivity troponin peaked at 72 in a pattern consistent with demand ischemia.  He self-discontinued Entresto, and then went to the ED a few days later. He was given an inhaler which seems to have improved his cough, but he remains short of breath. Of note, around the same time as starting entresto, he also moved into a new apartment with a strong paint smell. When he was  seen 02/2021 he was out of all his medicines. He was started back on carvedilol, lasix, and farxiga. He followed up with Joseph Shieldsaitlin Walker, NP 03/2021 and his weight was down 12 lbs, but he was still 8 lbs over his dry weight. He followed up later that month, and had not been taking Lasix as prescribed. He was started on spironolactone and referred to cardiac rehab. He had a BNP 04/2021 that was over 5,000.  At his last visit, he was doing well. He reported some atypical chest discomfort that seemed to be musculoskeletal. Sherryll Burgerntresto was restarted. He had a repeat echo 07/2021 which revealed LVEF 25-30%, mild LVH, grade 1 diastolic dysfunction, and ascending aorta dilated to 41 mm. We recommended referral to EP for consideration of ICD.   Today, he is doing well and accompanied by his daughter. He was delayed in getting his medication refills for about 1 week. Currently, he is able to take the majority of his prescriptions. His daughter reports he still does not have his potassium because he was not able to afford it. He also does not have spironolactone. He feels better on Entresto. He reports he occasionally forgets to eat prior to taking some of his medications which causes indigestion and dizziness. He has occasional pain along his R neck that diffuses down his R arm. The LE edema from his last visit has resolved. He reports he has had clubbed nails since he was a child. His daughter hopes  to bring him to the gym in the [facility] and have him exercise on the stationary bike. He denies any palpitations, chest pain, or shortness of breath, headaches, syncope, orthopnea, PND, lower extremity edema or exertional symptoms.  Past Medical History:  Diagnosis Date   Hyperlipemia    Hypertension    NSTEMI (non-ST elevated myocardial infarction) ([place])    Pure hypercholesterolemia 03/15/2021    History reviewed. No pertinent surgical history.   Current Outpatient Medications  Medication Sig Dispense  Refill   albuterol (VENTOLIN HFA) 108 (90 Base) MCG/ACT inhaler Inhale 2 puffs into the lungs every 6 (six) hours as needed for wheezing or shortness of breath.     aspirin EC 81 MG EC tablet Take 1 tablet (81 mg total) by mouth daily. Swallow whole.     atorvastatin (LIPITOR) 40 MG tablet Take 1 tablet (40 mg total) by mouth every evening. 90 tablet 3   carvedilol (COREG) 3.125 MG tablet Take 1 tablet (3.125 mg total) by mouth daily. 90 tablet 1   dapagliflozin propanediol (FARXIGA) 10 MG TABS tablet Take 1 tablet (10 mg total) by mouth daily before breakfast. 90 tablet 3   furosemide (LASIX) 40 MG tablet Take 1 tablet (40 mg total) by mouth daily. 135 tablet 3   gabapentin (NEURONTIN) 100 MG capsule Take 100 mg by mouth daily as needed (pain).     Multiple Vitamin (MULTIVITAMIN WITH MINERALS) TABS tablet Take 1 tablet by mouth daily.     sacubitril-valsartan (ENTRESTO) 24-26 MG TAKE 1/2 TABLET TWICE A DAY 30 tablet 5   Tiotropium Bromide Monohydrate (SPIRIVA RESPIMAT) 1.25 MCG/ACT AERS Inhale 2 puffs into the lungs daily. (Patient taking differently: Inhale 2 puffs into the lungs as needed.) 4 g 1   spironolactone (ALDACTONE) 25 MG tablet Take 0.5 tablets (12.5 mg total) by mouth daily. 45 tablet 3   No current facility-administered medications for this visit.    Allergies:   Patient has no known allergies.    Social History:  The patient  reports that he has been smoking. He has never used smokeless tobacco. He reports current alcohol use. He reports that he does not use drugs.   Family History:  The patient's family history includes Heart failure in his maternal grandfather, maternal grandmother, mother, paternal grandfather, and paternal grandmother.    ROS:   Please see the history of present illness. (+) Dizziness (+) Indigestion  (+) R neck pain (+) R arm pain All other systems are reviewed and negative.   PHYSICAL EXAM: VS:  BP 118/72 (BP Location: Right Arm, Patient  Position: Sitting, Cuff Size: Normal)    Pulse 90    Ht 6\' 3"  (1.905 m)    Wt 165 lb 3.2 oz (74.9 kg)    BMI 20.65 kg/m  , BMI Body mass index is 20.65 kg/m. GENERAL:  Well appearing HEENT:  Pupils equal round and reactive, fundi not visualized, oral mucosa unremarkable NECK:  No jugular venous distention, waveform within normal limits, carotid upstroke brisk and symmetric, no bruits LUNGS:  Clear to auscultation bilaterally. No crackles, rhonchi or wheezes HEART:  RRR.  PMI not displaced or sustained,S1 and S2 within normal limits, no S3, no S4, no clicks, no rubs, no murmurs ABD:  Flat, positive bowel sounds normal in frequency in pitch, no bruits, no rebound, no guarding, no midline pulsatile mass, no hepatomegaly, no splenomegaly EXT:  2 plus pulses throughout, no edema. No cyanosis, + clubbing SKIN:  No rashes no nodules  NEURO:  Cranial nerves II through XII grossly intact, motor grossly intact throughout PSYCH:  Cognitively intact, oriented to person place and time  EKG:   08/29/21: Sinus rhythm, rate 90 bpm; LVH with repolarization abnormality and PAC  Echo 08/21/21  1. Left ventricular ejection fraction, by estimation, is 25 to 30%. The  left ventricle has severely decreased function. The left ventricle  demonstrates global hypokinesis. There is mild concentric left ventricular  hypertrophy. Left ventricular diastolic   parameters are consistent with Grade I diastolic dysfunction (impaired  relaxation). The average left ventricular global longitudinal strain is  -1.5 %. The global longitudinal strain is abnormal.   2. Right ventricular systolic function is normal. The right ventricular  size is normal.   3. The mitral valve is normal in structure. Trivial mitral valve  regurgitation. No evidence of mitral stenosis.   4. The aortic valve is normal in structure. Aortic valve regurgitation is  not visualized. No aortic stenosis is present.   5. Aneurysm of the ascending aorta,  measuring 41 mm. There is borderline  dilatation of the aortic root, measuring 37 mm.   6. The inferior vena cava is normal in size with greater than 50%  respiratory variability, suggesting right atrial pressure of 3 mmHg.   Comparison(s): EF 20%.   Echo 11/21/2020: 1. Left ventricular ejection fraction, by estimation, is 20 to 25%. The  left ventricle has severely decreased function. The left ventricle  demonstrates global hypokinesis. The left ventricular internal cavity size  was moderately dilated. Left  ventricular diastolic parameters are consistent with Grade III diastolic  dysfunction (restrictive). Elevated left atrial pressure.   2. Right ventricular systolic function is mildly reduced. The right  ventricular size is mildly enlarged. There is normal pulmonary artery  systolic pressure.   3. Left atrial size was moderately dilated.   4. Right atrial size was severely dilated.   5. The mitral valve is normal in structure. Trivial mitral valve  regurgitation.   6. The aortic valve is tricuspid. Aortic valve regurgitation is trivial.  No aortic stenosis is present.   7. The inferior vena cava is dilated in size with <50% respiratory  variability, suggesting right atrial pressure of 15 mmHg.  Left heart cath 10/17/2020:  Left ventricular dysfunction out of proportion to his coronary artery  disease   Severe stenosis of a small left circumflex, mild-to-moderate stenosis of  a large RCA   Elevated left ventricular end-diastolic filling pressure   Right radial loop   Successful Perclose closure of the right femoral artery   Nuclear stress 10/15/20: Marland Kitchen Moderately severe fixed defect of the apex and distal lateral wall. This is associated with akinesia, and this is  consistent with a myocardial scar from prior infarct.   2. No reversible defects identified that would be suspect for myocardium in jeopardy for ischemia.   3. Left ventricular chamber is dilated at rest and  stress.   4. Hypokinesia of the septum and inferior wall.   5. LVEF is markedly reduced, and determined to be 27%.   Echo 10/14/20; Summary Statements  The left ventricle is mildly dilated.  There is mild concentric left ventricular hypertrophy.  Left ventricular systolic function is severely reduced.  Ejection Fraction = <25%.  There is severe global hypokinesis of the left ventricle.  The right ventricle is grossly normal size.  The right ventricular systolic function is moderate to severely reduced.  The left atrium is mildly dilated.  Borderline right atrial enlargement.  There is no pericardial effusion.  Grade I diastolic dysfunction.   Recent Labs: 11/20/2020: ALT 18 02/03/2021: Hemoglobin 12.5; Platelets 131 05/04/2021: BNP >5000.0; TSH 0.605 05/24/2021: BUN 14; Creatinine, Ser 1.40; Potassium 4.2; Sodium 140    Lipid Panel    Component Value Date/Time   CHOL 123 11/23/2020 0344   TRIG 138 11/23/2020 0344   HDL 39 (L) 11/23/2020 0344   CHOLHDL 3.2 11/23/2020 0344   VLDL 28 11/23/2020 0344   LDLCALC 56 11/23/2020 0344      Wt Readings from Last 3 Encounters:  08/29/21 165 lb 3.2 oz (74.9 kg)  05/24/21 155 lb 1.6 oz (70.4 kg)  04/19/21 169 lb (76.7 kg)      ASSESSMENT AND PLAN: Chronic combined systolic and diastolic heart failure (HCC) LVEF was 20 to 25% previously.  It did increase slightly to 25 to 30%.  He is doing very well clinically and is euvolemic.  He is not having any edema, shortness of breath, orthopnea, or PND.  He feels much better on Entresto.  For the week or 2 prior to his most recent echo he was actually off of his Entresto and other heart medications.  In general he has been taking them regularly.  I am concerned that the systolic function may be actually better than it was seen on his echo.  He is going to start back taking them more consistently and repeat his echo in 3 months.  If LVEF remains less than 35% we will refer for ICD at that time.   He is going to start slowly increasing his exercise.  Coronary artery disease Status post PCI at an outside hospital.  Patient is doing well clinically and has no ischemic symptoms.  Continue aspirin, carvedilol, and atorvastatin.  LDL was 56 on 10/2020.  Protein-calorie malnutrition, severe Albumin was low in the hospital.  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  He seems to be doing much better.  We will check a CMP today.  CKD (chronic kidney disease) stage 3, GFR 30-59 ml/min (HCC) CKD 3A.  His renal function has been stable to improving.  Continue [place] and [place].  He has had hypokalemia in the past and has been on potassium supplementation.  Now that he is on both Entresto and spironolactone he may not need it.  He has been out of it for a few days.  We will check a CMP today to see if he needs to resume potassium supplementation.   Current medicines are reviewed at length with the patient today.  The patient does not have concerns regarding medicines.  The following changes have been made: resume Entresto    Labs/ tests ordered today include:   Orders Placed This Encounter  Procedures   Comprehensive metabolic panel   ECHOCARDIOGRAM LIMITED    Time spent: 50 minutes-Greater than 50% of this time was spent in counseling, explanation of diagnosis, planning of further management, and coordination of care.   Disposition:    FU with Briceyda Abdullah C. Oval Linsey, MD, [facility] in 3 months after echo  [place] as a scribe for Skeet Latch, MD.,have documented all relevant documentation on the behalf of Skeet Latch, MD,as directed by  Skeet Latch, MD while in the presence of Skeet Latch, MD.  I, Bee Oval Linsey, MD have reviewed all documentation for this visit.  The documentation of the exam, diagnosis, procedures, and orders on 08/29/2021 are all accurate and complete.   Signed, Dayani Winbush C. Oval Linsey, MD, [facility]  08/29/2021 2:06  PM    [facility]

## 2021-08-29 ENCOUNTER — Other Ambulatory Visit: Payer: Self-pay

## 2021-08-29 ENCOUNTER — Encounter (HOSPITAL_BASED_OUTPATIENT_CLINIC_OR_DEPARTMENT_OTHER): Payer: Self-pay | Admitting: Cardiovascular Disease

## 2021-08-29 ENCOUNTER — Ambulatory Visit (HOSPITAL_BASED_OUTPATIENT_CLINIC_OR_DEPARTMENT_OTHER): Payer: HMO | Admitting: Cardiovascular Disease

## 2021-08-29 VITALS — BP 118/72 | HR 90 | Ht 75.0 in | Wt 165.2 lb

## 2021-08-29 DIAGNOSIS — I5022 Chronic systolic (congestive) heart failure: Secondary | ICD-10-CM

## 2021-08-29 DIAGNOSIS — I2510 Atherosclerotic heart disease of native coronary artery without angina pectoris: Secondary | ICD-10-CM

## 2021-08-29 DIAGNOSIS — Z5181 Encounter for therapeutic drug level monitoring: Secondary | ICD-10-CM | POA: Diagnosis not present

## 2021-08-29 DIAGNOSIS — I10 Essential (primary) hypertension: Secondary | ICD-10-CM | POA: Diagnosis not present

## 2021-08-29 DIAGNOSIS — I5042 Chronic combined systolic (congestive) and diastolic (congestive) heart failure: Secondary | ICD-10-CM

## 2021-08-29 DIAGNOSIS — N1832 Chronic kidney disease, stage 3b: Secondary | ICD-10-CM

## 2021-08-29 DIAGNOSIS — E43 Unspecified severe protein-calorie malnutrition: Secondary | ICD-10-CM

## 2021-08-29 MED ORDER — SPIRONOLACTONE 25 MG PO TABS: 12.5000 mg | ORAL_TABLET | Freq: Every day | ORAL | 3 refills | Status: DC

## 2021-08-29 NOTE — Patient Instructions (Addendum)
Medication Instructions:  Your physician recommends that you continue on your current medications as directed. Please refer to the Current Medication list given to you today.   *If you need a refill on your cardiac medications before your next appointment, please call your pharmacy*  Lab Work: CMET TODAY   If you have labs (blood work) drawn today and your tests are completely normal, you will receive your results only by: MyChart Message (if you have MyChart) OR A paper copy in the mail If you have any lab test that is abnormal or we need to change your treatment, we will call you to review the results.  Testing/Procedures: LIMITED ECHO IN 3 MONTHS FEW DAYS PRIOR TO FOLLOW UP   Follow-Up: At [facility], you and your health needs are our priority.  As part of our continuing mission to provide you with exceptional heart care, we have created designated Provider Care Teams.  These Care Teams include your primary Cardiologist (physician) and Advanced Practice Providers (APPs -  Physician Assistants and Nurse Practitioners) who all work together to provide you with the care you need, when you need it.  We recommend signing up for the patient portal called "MyChart".  Sign up information is provided on this After Visit Summary.  MyChart is used to connect with patients for Virtual Visits (Telemedicine).  Patients are able to view lab/test results, encounter notes, upcoming appointments, etc.  Non-urgent messages can be sent to your provider as well.   To learn more about what you can do with MyChart, go to [email or web address].    Your next appointment:   3 month(s)  The format for your next appointment:   In Person  Provider:   [name], MD

## 2021-08-29 NOTE — Assessment & Plan Note (Signed)
Status post PCI at an outside hospital.  Patient is doing well clinically and has no ischemic symptoms.  Continue aspirin, carvedilol, and atorvastatin.  LDL was 56 on 10/2020.

## 2021-08-29 NOTE — Assessment & Plan Note (Signed)
Albumin was low in the hospital.  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  He seems to be doing much better.  We will check a CMP today.

## 2021-08-29 NOTE — Assessment & Plan Note (Signed)
CKD 3A.  His renal function has been stable to improving.  Continue [place] and [place].  He has had hypokalemia in the past and has been on potassium supplementation.  Now that he is on both Entresto and spironolactone he may not need it.  He has been out of it for a few days.  We will check a CMP today to see if he needs to resume potassium supplementation.

## 2021-08-29 NOTE — Assessment & Plan Note (Signed)
LVEF was 20 to 25% previously.  It did increase slightly to 25 to 30%.  He is doing very well clinically and is euvolemic.  He is not having any edema, shortness of breath, orthopnea, or PND.  He feels much better on Entresto.  For the week or 2 prior to his most recent echo he was actually off of his Entresto and other heart medications.  In general he has been taking them regularly.  I am concerned that the systolic function may be actually better than it was seen on his echo.  He is going to start back taking them more consistently and repeat his echo in 3 months.  If LVEF remains less than 35% we will refer for ICD at that time.  He is going to start slowly increasing his exercise.

## 2021-08-30 LAB — COMPREHENSIVE METABOLIC PANEL
ALT: 24 IU/L (ref 0–44)
AST: 42 IU/L — ABNORMAL HIGH (ref 0–40)
Albumin/Globulin Ratio: 1.2 (ref 1.2–2.2)
Albumin: 4.1 g/dL (ref 3.8–4.8)
Alkaline Phosphatase: 65 IU/L (ref 44–121)
BUN/Creatinine Ratio: 10 (ref 10–24)
BUN: 15 mg/dL (ref 8–27)
Bilirubin Total: 0.7 mg/dL (ref 0.0–1.2)
CO2: 26 mmol/L (ref 20–29)
Calcium: 9.4 mg/dL (ref 8.6–10.2)
Chloride: 100 mmol/L (ref 96–106)
Creatinine, Ser: 1.52 mg/dL — ABNORMAL HIGH (ref 0.76–1.27)
Globulin, Total: 3.3 g/dL (ref 1.5–4.5)
Glucose: 94 mg/dL (ref 70–99)
Potassium: 3.8 mmol/L (ref 3.5–5.2)
Sodium: 140 mmol/L (ref 134–144)
Total Protein: 7.4 g/dL (ref 6.0–8.5)
eGFR: 49 mL/min/{1.73_m2} — ABNORMAL LOW (ref >59)

## 2021-08-30 NOTE — Addendum Note (Signed)
Addended by: Barrie Dunker on: 08/30/2021 08:18 AM   Modules accepted: Orders

## 2021-09-18 ENCOUNTER — Telehealth (HOSPITAL_BASED_OUTPATIENT_CLINIC_OR_DEPARTMENT_OTHER): Payer: Self-pay

## 2021-09-18 NOTE — Telephone Encounter (Addendum)
Left voice message asking to call the office back for recent lab results.   ----- Message from [name], MD sent at 09/16/2021 11:49 PM EST ----- Labs are stable.  He doesn't need to start back taking potassium supplement.

## 2021-09-19 NOTE — Telephone Encounter (Signed)
Called results to patient and left results on VM (ok per DPR), instructions left to call office back if patient has any questions!       ----- Message from [name], MD sent at 09/16/2021 11:49 PM EST ----- Labs are stable.  He doesn't need to start back taking potassium supplement.

## 2021-09-19 NOTE — Telephone Encounter (Signed)
Patient's daughter is returning call to discuss lab results. 

## 2021-09-19 NOTE — Telephone Encounter (Signed)
Done, can remove from your basket

## 2021-11-05 ENCOUNTER — Institutional Professional Consult (permissible substitution): Payer: HMO | Admitting: Internal Medicine

## 2021-11-15 ENCOUNTER — Encounter (HOSPITAL_BASED_OUTPATIENT_CLINIC_OR_DEPARTMENT_OTHER): Payer: Self-pay | Admitting: Cardiovascular Disease

## 2021-11-26 ENCOUNTER — Other Ambulatory Visit (HOSPITAL_BASED_OUTPATIENT_CLINIC_OR_DEPARTMENT_OTHER): Payer: HMO

## 2021-12-05 ENCOUNTER — Other Ambulatory Visit (HOSPITAL_BASED_OUTPATIENT_CLINIC_OR_DEPARTMENT_OTHER): Payer: HMO

## 2021-12-14 ENCOUNTER — Other Ambulatory Visit (HOSPITAL_BASED_OUTPATIENT_CLINIC_OR_DEPARTMENT_OTHER): Payer: HMO

## 2021-12-18 ENCOUNTER — Ambulatory Visit (HOSPITAL_BASED_OUTPATIENT_CLINIC_OR_DEPARTMENT_OTHER): Payer: Medicare (Managed Care) | Admitting: Cardiovascular Disease

## 2021-12-18 NOTE — Progress Notes (Incomplete)
Cardiology Office Note   Date:  12/18/2021   ID:  Guage Riopel Solow, DOB [birth year], MRN [id number]  PCP:  Pcp, No  Cardiologist:   Joaquin Music   No chief complaint on file.    History of Present Illness: Joseph Tanner is a 71 y.o. male with chronic systolic and diastolic heart failure, CKD 3, CAD status post NSTEMI with PCI, hypertension, hyperlipidemia, and alcohol abuse who presents for follow-up.  Mr. Eckerd, recently moved to [place] from [place].  He has a history of an NSTEMI and chronic systolic diastolic heart failure. This occurred around 2019. He initially did not have a heart cath.  Prior to leaving [place] he was hospitalized 09/2020 with recurrent NSTEMI and heart failure symptoms.  Had a left heart catheterization that revealed severe stenosis of a small left circumflex and mild to moderate stenosis in the RCA which was large and heavily calcified.  His LVEF was 25% in [place].  He reports that it has been this level for many years and that he has been offered an ICD but declined.  He presented to [facility] 11/20/2020 with acute on chronic systolic and diastolic heart failure.  BNP was 4500.  High-sensitivity troponin was mildly elevated to 54 and relatively flat.  He was diuresed and switched from BiDil to [place].  He was discharged on 4/29.  His discharge weight is unclear.  On the day prior to discharge he was 69.3 kg. On repeat labs his creatinine improved, so entresto was continued. Since his last visit, he was seen in the hospital twice for COPD exacerbations. High sensitivity troponin peaked at 72 in a pattern consistent with demand ischemia.  He self-discontinued Entresto, and then went to the ED a few days later. He was given an inhaler which seems to have improved his cough, but he remains short of breath. Of note, around the same time as starting entresto, he also moved into a new apartment with a strong paint smell. When he was seen  02/2021 he was out of all his medicines. He was started back on carvedilol, lasix, and farxiga. He followed up with Laurann Montana, NP 03/2021 and his weight was down 12 lbs, but he was still 8 lbs over his dry weight. He followed up later that month, and had not been taking Lasix as prescribed. He was started on spironolactone and referred to cardiac rehab. He had a BNP 04/2021 that was over 5,000.  At his last visit, he was doing well. He reported some atypical chest discomfort that seemed to be musculoskeletal. Delene Loll was restarted. He had a repeat echo 07/2021 which revealed LVEF 25-30%, mild LVH, grade 1 diastolic dysfunction, and ascending aorta dilated to 41 mm. We recommended referral to EP for consideration of ICD.    *** Today, he is doing well and accompanied by his daughter. He was delayed in getting his medication refills for about 1 week. Currently, he is able to take the majority of his prescriptions. His daughter reports he still does not have his potassium because he was not able to afford it. He also does not have spironolactone. He feels better on Entresto. He reports he occasionally forgets to eat prior to taking some of his medications which causes indigestion and dizziness. He has occasional pain along his R neck that diffuses down his R arm. The LE edema from his last visit has resolved. He reports he has had clubbed nails since he was a child. His daughter  hopes to bring him to the gym in the [facility] and have him exercise on the stationary bike. He denies any palpitations, chest pain, or shortness of breath, headaches, syncope, orthopnea, PND, lower extremity edema or exertional symptoms.  Past Medical History:  Diagnosis Date   Hyperlipemia    Hypertension    NSTEMI (non-ST elevated myocardial infarction) ([place])    Pure hypercholesterolemia 03/15/2021    No past surgical history on file.   Current Outpatient Medications  Medication Sig Dispense Refill    albuterol (VENTOLIN HFA) 108 (90 Base) MCG/ACT inhaler Inhale 2 puffs into the lungs every 6 (six) hours as needed for wheezing or shortness of breath.     aspirin EC 81 MG EC tablet Take 1 tablet (81 mg total) by mouth daily. Swallow whole.     atorvastatin (LIPITOR) 40 MG tablet Take 1 tablet (40 mg total) by mouth every evening. 90 tablet 3   carvedilol (COREG) 3.125 MG tablet Take 1 tablet (3.125 mg total) by mouth daily. 90 tablet 1   dapagliflozin propanediol (FARXIGA) 10 MG TABS tablet Take 1 tablet (10 mg total) by mouth daily before breakfast. 90 tablet 3   furosemide (LASIX) 40 MG tablet Take 1 tablet (40 mg total) by mouth daily. 135 tablet 3   gabapentin (NEURONTIN) 100 MG capsule Take 100 mg by mouth daily as needed (pain).     Multiple Vitamin (MULTIVITAMIN WITH MINERALS) TABS tablet Take 1 tablet by mouth daily.     sacubitril-valsartan (ENTRESTO) 24-26 MG TAKE 1/2 TABLET TWICE A DAY 30 tablet 5   spironolactone (ALDACTONE) 25 MG tablet Take 0.5 tablets (12.5 mg total) by mouth daily. 45 tablet 3   Tiotropium Bromide Monohydrate (SPIRIVA RESPIMAT) 1.25 MCG/ACT AERS Inhale 2 puffs into the lungs daily. (Patient taking differently: Inhale 2 puffs into the lungs as needed.) 4 g 1   No current facility-administered medications for this visit.    Allergies:   Patient has no known allergies.    Social History:  The patient  reports that he has been smoking. He has never used smokeless tobacco. He reports current alcohol use. He reports that he does not use drugs.   Family History:  The patient's family history includes Heart failure in his maternal grandfather, maternal grandmother, mother, paternal grandfather, and paternal grandmother.    ROS:   Please see the history of present illness.  *** (+) Dizziness (+) Indigestion  (+) R neck pain (+) R arm pain All other systems are reviewed and negative.   PHYSICAL EXAM: VS:  There were no vitals taken for this visit. , BMI There  is no height or weight on file to calculate BMI. GENERAL:  Well appearing HEENT:  Pupils equal round and reactive, fundi not visualized, oral mucosa unremarkable NECK:  No jugular venous distention, waveform within normal limits, carotid upstroke brisk and symmetric, no bruits LUNGS:  Clear to auscultation bilaterally. No crackles, rhonchi or wheezes HEART:  RRR.  PMI not displaced or sustained,S1 and S2 within normal limits, no S3, no S4, no clicks, no rubs, no murmurs ABD:  Flat, positive bowel sounds normal in frequency in pitch, no bruits, no rebound, no guarding, no midline pulsatile mass, no hepatomegaly, no splenomegaly EXT:  2 plus pulses throughout, no edema. No cyanosis, + clubbing SKIN:  No rashes no nodules NEURO:  Cranial nerves II through XII grossly intact, motor grossly intact throughout PSYCH:  Cognitively intact, oriented to person place and time  EKG:  12/18/2021: *** 08/29/21: Sinus rhythm, rate 90 bpm; LVH with repolarization abnormality and PAC  Echo 08/21/21  1. Left ventricular ejection fraction, by estimation, is 25 to 30%. The left ventricle has severely decreased function. The left ventricle demonstrates global hypokinesis. There is mild concentric left ventricular hypertrophy. Left ventricular diastolic   parameters are consistent with Grade I diastolic dysfunction (impaired relaxation). The average left ventricular global longitudinal strain is -1.5 %. The global longitudinal strain is abnormal.   2. Right ventricular systolic function is normal. The right ventricular size is normal.   3. The mitral valve is normal in structure. Trivial mitral valve  regurgitation. No evidence of mitral stenosis.   4. The aortic valve is normal in structure. Aortic valve regurgitation is not visualized. No aortic stenosis is present.   5. Aneurysm of the ascending aorta, measuring 41 mm. There is borderline dilatation of the aortic root, measuring 37 mm.   6. The inferior vena cava  is normal in size with greater than 50% respiratory variability, suggesting right atrial pressure of 3 mmHg.   Comparison(s): EF 20%.   Echo 11/21/2020: 1. Left ventricular ejection fraction, by estimation, is 20 to 25%. The left ventricle has severely decreased function. The left ventricle demonstrates global hypokinesis. The left ventricular internal cavity size was moderately dilated. Left  ventricular diastolic parameters are consistent with Grade III diastolic dysfunction (restrictive). Elevated left atrial pressure.   2. Right ventricular systolic function is mildly reduced. The right ventricular size is mildly enlarged. There is normal pulmonary artery systolic pressure.   3. Left atrial size was moderately dilated.   4. Right atrial size was severely dilated.   5. The mitral valve is normal in structure. Trivial mitral valve  regurgitation.   6. The aortic valve is tricuspid. Aortic valve regurgitation is trivial. No aortic stenosis is present.   7. The inferior vena cava is dilated in size with <50% respiratory variability, suggesting right atrial pressure of 15 mmHg.  Left heart cath 10/17/2020:  Left ventricular dysfunction out of proportion to his coronary artery disease   Severe stenosis of a small left circumflex, mild-to-moderate stenosis of a large RCA   Elevated left ventricular end-diastolic filling pressure   Right radial loop   Successful Perclose closure of the right femoral artery   Nuclear stress 10/15/20: Marland Kitchen Moderately severe fixed defect of the apex and distal lateral wall. This is associated with akinesia, and this is  consistent with a myocardial scar from prior infarct.   2. No reversible defects identified that would be suspect for myocardium in jeopardy for ischemia.   3. Left ventricular chamber is dilated at rest and stress.   4. Hypokinesia of the septum and inferior wall.   5. LVEF is markedly reduced, and determined to be 27%.   Echo  10/14/20; Summary Statements  The left ventricle is mildly dilated.  There is mild concentric left ventricular hypertrophy.  Left ventricular systolic function is severely reduced.  Ejection Fraction = <25%.  There is severe global hypokinesis of the left ventricle.  The right ventricle is grossly normal size.  The right ventricular systolic function is moderate to severely reduced.  The left atrium is mildly dilated.  Borderline right atrial enlargement.  There is no pericardial effusion.  Grade I diastolic dysfunction.   Recent Labs: 02/03/2021: Hemoglobin 12.5; Platelets 131 05/04/2021: BNP >5000.0; TSH 0.605 08/29/2021: ALT 24; BUN 15; Creatinine, Ser 1.52; Potassium 3.8; Sodium 140    Lipid Panel  Component Value Date/Time   CHOL 123 11/23/2020 0344   TRIG 138 11/23/2020 0344   HDL 39 (L) 11/23/2020 0344   CHOLHDL 3.2 11/23/2020 0344   VLDL 28 11/23/2020 0344   LDLCALC 56 11/23/2020 0344      Wt Readings from Last 3 Encounters:  08/29/21 165 lb 3.2 oz (74.9 kg)  05/24/21 155 lb 1.6 oz (70.4 kg)  04/19/21 169 lb (76.7 kg)      ASSESSMENT AND PLAN: No problem-specific Assessment & Plan notes found for this encounter.    Current medicines are reviewed at length with the patient today.  The patient does not have concerns regarding medicines.  The following changes have been made: resume Entresto ***    Labs/ tests ordered today include: ***  No orders of the defined types were placed in this encounter.   Time spent: 50 minutes-Greater than 50% of this time was spent in counseling, explanation of diagnosis, planning of further management, and coordination of care.   Disposition:    FU with [name] C. Oval Linsey, MD, [facility] in 3 months after echo ***   I,Zite Okoli,acting as a Education administrator for Skeet Latch, MD.,have documented all relevant documentation on the behalf of Skeet Latch, MD,as directed by  Skeet Latch, MD while in the presence of Skeet Latch, MD.   I, [place] Oval Linsey, MD have reviewed all documentation for this visit.  The documentation of the exam, diagnosis, procedures, and orders on 12/18/2021 are all accurate and complete.   Signed, [name] C. Oval Linsey, MD, [facility]  12/18/2021 10:13 AM    [place]

## 2022-01-07 ENCOUNTER — Telehealth (HOSPITAL_BASED_OUTPATIENT_CLINIC_OR_DEPARTMENT_OTHER): Payer: Self-pay | Admitting: Cardiovascular Disease

## 2022-01-07 NOTE — Telephone Encounter (Signed)
Patient was No Show x 3--11/26/21--12/05/21--12/14/21 for 3 month limited Echocardiogram ordered by Dr. Oval Linsey.  Patient was also No Show for visit with Dr. Crissie Sickles and follow up with Dr. Oval Linsey.   Echo order will be cancelled at this time.

## 2022-03-22 ENCOUNTER — Other Ambulatory Visit (HOSPITAL_BASED_OUTPATIENT_CLINIC_OR_DEPARTMENT_OTHER): Payer: Self-pay | Admitting: Cardiovascular Disease

## 2022-03-22 NOTE — Telephone Encounter (Signed)
03/22/2022 Message left for patient to return call to schedule an appt. Jim Like MHA  RN CCM

## 2022-08-27 ENCOUNTER — Telehealth (HOSPITAL_BASED_OUTPATIENT_CLINIC_OR_DEPARTMENT_OTHER): Payer: Self-pay | Admitting: Cardiovascular Disease

## 2022-08-27 ENCOUNTER — Ambulatory Visit: Payer: Medicare (Managed Care) | Admitting: Nurse Practitioner

## 2022-08-27 NOTE — Telephone Encounter (Signed)
Spoke with patient regarding shortness of breath, not currently having Has been out of his Entresto for a while Breathing worse at night  Output not as frequent as was with diuretic No showed for last 3 Echos and follow up in May Scheduled visit for patient today with Clarene Essex NP  Advised patient this was the only available appointment today and he needed to be seen, verbalized understanding  Stated he did not drive and would call back if unable to make appointment

## 2022-08-27 NOTE — Progress Notes (Deleted)
Office Visit    Patient Name: Joseph Tanner Date of Encounter: 08/27/2022  Primary Care Provider:  Pcp, No Primary Cardiologist:  Skeet Latch, MD Primary Electrophysiologist: None  Chief Complaint    Joseph Tanner is a 72 y.o. male with PMH of CAD s/p NSTEMI with PCI chronic diastolic CHF, CKD stage III, HLD, HTN, EtOH abuse, who presents today for follow-up of shortness of breath and congestive heart failure.  Past Medical History    Past Medical History:  Diagnosis Date   Hyperlipemia    Hypertension    NSTEMI (non-ST elevated myocardial infarction) ([place])    Pure hypercholesterolemia 03/15/2021   No past surgical history on file.  Allergies  No Known Allergies  History of Present Illness    [name]  is a 72 year old male with the above mention past medical history who presents today for SOB that has been going on for last 3 to 4 days.  He reported that he has been out of Entresto for a while and breathing is worse at night.  He endorsed decreased output with diuretic.  Most recent 2D echo completed 07/2021 with EF of 25-30% and severely decreased LV function with global hypokinesis, ascending aortic dilation of 41 mm, and trivial MV regurgitation.  He was referred to EP for consideration of ICD.  He however was a no-show for his referral with Dr. Lovena Le and has also missed his last 3 echocardiograms.      Since last being seen in the office patient reports***.  Patient denies chest pain, palpitations, dyspnea, PND, orthopnea, nausea, vomiting, dizziness, syncope, edema, weight gain, or early satiety.     ***Notes: -Patient is currently GDMT consist of Aldactone 12.5 mg, Entresto 24/26 mg, Lasix 40 mg,Farxiga 10 mg, carvedilol 3.125 mg Home Medications    Current Outpatient Medications  Medication Sig Dispense Refill   albuterol (VENTOLIN HFA) 108 (90 Base) MCG/ACT inhaler Inhale 2 puffs into the lungs every 6 (six) hours  as needed for wheezing or shortness of breath.     aspirin EC 81 MG EC tablet Take 1 tablet (81 mg total) by mouth daily. Swallow whole.     atorvastatin (LIPITOR) 40 MG tablet TAKE 1 TABLET BY MOUTH ONCE DAILY IN THE EVENING 60 tablet 0   carvedilol (COREG) 3.125 MG tablet Take 1 tablet (3.125 mg total) by mouth daily. 90 tablet 1   FARXIGA 10 MG TABS tablet TAKE 1 TABLET BY MOUTH ONCE DAILY BEFORE BREAKFAST 30 tablet 0   furosemide (LASIX) 40 MG tablet Take 1 tablet (40 mg total) by mouth daily. 135 tablet 3   gabapentin (NEURONTIN) 100 MG capsule Take 100 mg by mouth daily as needed (pain).     Multiple Vitamin (MULTIVITAMIN WITH MINERALS) TABS tablet Take 1 tablet by mouth daily.     sacubitril-valsartan (ENTRESTO) 24-26 MG TAKE 1/2 TABLET TWICE A DAY 30 tablet 5   spironolactone (ALDACTONE) 25 MG tablet Take 0.5 tablets (12.5 mg total) by mouth daily. 45 tablet 3   Tiotropium Bromide Monohydrate (SPIRIVA RESPIMAT) 1.25 MCG/ACT AERS Inhale 2 puffs into the lungs daily. (Patient taking differently: Inhale 2 puffs into the lungs as needed.) 4 g 1   No current facility-administered medications for this visit.     Review of Systems  Please see the history of present illness.    (+)*** (+)***  All other systems reviewed and are otherwise negative except as noted above.  Physical Exam  Wt Readings from Last 3 Encounters:  08/29/21 165 lb 3.2 oz (74.9 kg)  05/24/21 155 lb 1.6 oz (70.4 kg)  04/19/21 169 lb (76.7 kg)   [id number] were no vitals filed for this visit.,There is no height or weight on file to calculate BMI.  Constitutional:      Appearance: Healthy appearance. Not in distress.  Neck:     Vascular: JVD normal.  Pulmonary:     Effort: Pulmonary effort is normal.     Breath sounds: No wheezing. No rales. Diminished in the bases Cardiovascular:     Normal rate. Regular rhythm. Normal S1. Normal S2.      Murmurs: There is no murmur.  Edema:    Peripheral edema absent.   Abdominal:     Palpations: Abdomen is soft non tender. There is no hepatomegaly.  Skin:    General: Skin is warm and dry.  Neurological:     General: No focal deficit present.     Mental Status: Alert and oriented to person, place and time.     Cranial Nerves: Cranial nerves are intact.  EKG/LABS/Other Studies Reviewed    ECG personally reviewed by me today - ***  Risk Assessment/Calculations:   {Does this patient have ATRIAL FIBRILLATION?:[phone]}        Lab Results  Component Value Date   WBC 3.9 (L) 02/03/2021   HGB 12.5 (L) 02/03/2021   HCT 39.2 02/03/2021   MCV 88.3 02/03/2021   PLT 131 (L) 02/03/2021   Lab Results  Component Value Date   CREATININE 1.52 (H) 08/29/2021   BUN 15 08/29/2021   NA 140 08/29/2021   K 3.8 08/29/2021   CL 100 08/29/2021   CO2 26 08/29/2021   Lab Results  Component Value Date   ALT 24 08/29/2021   AST 42 (H) 08/29/2021   ALKPHOS 65 08/29/2021   BILITOT 0.7 08/29/2021   Lab Results  Component Value Date   CHOL 123 11/23/2020   HDL 39 (L) 11/23/2020   LDLCALC 56 11/23/2020   TRIG 138 11/23/2020   CHOLHDL 3.2 11/23/2020    No results found for: "HGBA1C"  Assessment & Plan    1.  Shortness of breath  2. Chronic combined systolic and diastolic CHF:   3. Essential hypertension:  4. CKD stage IIIb:  5.Coronary artery disease:      Disposition: Follow-up with Skeet Latch, MD or APP in *** months {Are you ordering a CV Procedure (e.g. stress test, cath, DCCV, TEE, etc)?   Press F2        :[id number]   Medication Adjustments/Labs and Tests Ordered: Current medicines are reviewed at length with the patient today.  Concerns regarding medicines are outlined above.   Signed, Mable Fill, Marissa Nestle, NP 08/27/2022, 1:25 PM [facility]  Note:  This document was prepared using Dragon voice recognition software and may include unintentional dictation errors.

## 2022-08-27 NOTE — Telephone Encounter (Signed)
Pt c/o Shortness Of Breath: STAT if SOB developed within the last 24 hours or pt is noticeably SOB on the phone  1. Are you currently SOB (can you hear that pt is SOB on the phone)? Shortness of breath, not at this exact time  2. How long have you been experiencing SOB? Last 3 or 4 days  3. Are you SOB when sitting or when up moving around? When he is trying to sleep  4. Are you currently experiencing any other symptoms? When he eats, stomach get full real quick after 2 or 3 bites- patient wants to be seen

## 2022-08-29 ENCOUNTER — Other Ambulatory Visit (HOSPITAL_BASED_OUTPATIENT_CLINIC_OR_DEPARTMENT_OTHER): Payer: Self-pay | Admitting: Cardiovascular Disease

## 2022-08-29 NOTE — Telephone Encounter (Signed)
Rx request sent to pharmacy.  

## 2022-09-17 ENCOUNTER — Ambulatory Visit (HOSPITAL_BASED_OUTPATIENT_CLINIC_OR_DEPARTMENT_OTHER): Payer: Medicare (Managed Care) | Admitting: Family

## 2022-10-04 ENCOUNTER — Ambulatory Visit (HOSPITAL_BASED_OUTPATIENT_CLINIC_OR_DEPARTMENT_OTHER): Payer: Medicare (Managed Care) | Admitting: Family

## 2022-10-21 ENCOUNTER — Telehealth (HOSPITAL_BASED_OUTPATIENT_CLINIC_OR_DEPARTMENT_OTHER): Payer: Self-pay

## 2022-10-21 NOTE — Telephone Encounter (Signed)
   Pre-operative Risk Assessment    Patient Name: Joseph Tanner  DOB: 1951-02-02 MRN: [id number]     Request for Surgical Clearance    Procedure:  Dental Extraction - Amount of Teeth to be Pulled:  3  Date of Surgery:  Clearance 10/21/22                                 Surgeon:  Sander Nephew, DDS Surgeon's Group or Practice Name:  [facility] Phone number:  [phone] Fax number:  [phone]   Type of Clearance Requested:   - Medical    Type of Anesthesia:   Not specified   Additional requests/questions:   "High Blood Pressure, Farxiga, Patient has not seen MD in a while and has not taken blood thinners in a while. "  Joseph Tanner, Joseph Tanner   10/21/2022, 9:42 AM \

## 2022-10-21 NOTE — Telephone Encounter (Signed)
Preop callback staff, please contact requesting provider that we are unable to accommodate urgent request today. He called our office reporting symptoms on 08/27/2022 and was scheduled for an office visit which he did not show up for.  He has had multiple missed visits. He will need an in-person office visit for follow-up prior to clearance being provided.  Thank you, Sharyn Lull

## 2022-10-21 NOTE — Telephone Encounter (Signed)
I s/w the dental office. I read the notes from Christen Bame, NP, unable to provide clearance. I will fax these notes to requesting office. Pt will need to call the office and schedule an appt in the office for follow.   Requesting office thanked me for the call and the notes.

## 2022-10-23 NOTE — Progress Notes (Unsigned)
Office Visit    Patient Name: Kamauri Peruski Knutzen Date of Encounter: 10/24/2022  PCP:  Kathyrn Lass   Vin[place]  Cardiologist:  Skeet Latch, MD  Advanced Practice Provider:  No care team member to display Electrophysiologist:  None   HPI    Joseph Tanner is a 72 y.o. male with chronic systolic and diastolic heart failure, CKD 3, CAD status post NSTEMI with PCI, hypertension, hyperlipidemia, and alcohol abuse presents today for follow-up appointment.  The patient recently moved to [place] from [place].  He has a history of NSTEMI and chronic systolic and diastolic heart failure.  This occurred around 2019.  He initially did not have a heart catheterization.  Prior to leaving [place] he was hospitalized 3/22 with recurrent NSTEMI and heart failure symptoms.  Had a left heart catheterization that revealed severe stenosis of the small left circumflex and mild to moderate stenosis in the RCA which was large and heavily calcified.  His LVEF was 25% in [place].  He reports that it has been this level for many years and that he has been offered an ICD but declined.  He presented to [facility] 11/20/2020 with acute on chronic systolic and diastolic heart failure.  BNP was 4500.  High sensitive troponin was mildly elevated at 54 and relatively flat.  He was diuresed and switched from BiDil to [place].  He was discharged on 4/29.  His discharge weight is unclear.  On the day prior to discharge he was 69.3 kg.  On repeat labs his creatinine proved and Entresto was continued.  Since his last visit, he was seen in the hospital twice for COPD exacerbations.  High-sensitivity troponin peaked at 72 and a pattern consistent with demand ischemia.  He self discontinued Entresto, and then went to the ED a few days later.  He was given an inhaler which seemed to improve his cough but he remains short of breath.  Of note, around the same time as starting  Entresto he also moved into a new apartment with strong paint small.  He was seen 8/22 and was all out of medicine.  He was started back on carvedilol, Lasix, and [place].  Followed up with Laurann Montana, NP 9/22 and weight was down 12 pounds but was still a pounds over his dry weight.  He followed up later that month and had not been taking his Lasix as prescribed.  He was started on spironolactone and referred to cardiac rehab.  He had a BNP 10/22 that was over 5000.  At his last visit he was doing well reported some atypical chest pain that seem to be musculoskeletal.  Delene Loll was restarted.  Repeat echocardiogram 1/23 revealed LVEF 25 to 30%, mild LVH, grade 1 DD and ascending aortic dilatation of 41 mm.  Referral was recommended to EP for consideration of ICD.  Was last seen 08/29/2021 and was delayed in getting his medication refilled for about a week.  Currently, he was able  to take the majority of his prescriptions.  His daughter reported that he still does not have his potassium because he was unable to afford it.  He also does not have spironolactone.  Felt better on Entresto.  Reported occasionally forgets to eat prior to taking some of his medicines which causes indigestion and dizziness.  Occasionally was having pain along the right neck and diffuses down his right arm.  The lower extremity edema from his last visit was resolved.  He  reported that he had some clubbing and nail since he was a child.  Daughter hopes to bring him to the gym and have him exercise on a stationary bike.  He denied any palpitations, chest pain, shortness of breath, headaches, syncope, orthopnea, PND, lower extremity edema, or exertional symptoms.  Today, he comes in with multiple complaints.  He is having some swelling in his lower legs as well as shortness of breath.  No symptoms today.  He said his breathing he does have occasionally shortness of breath gets worse during these times.  Occasionally has potation's as  well.  If he is calm then he is okay  Majority of his life he has had issues with weight.  He cannot unwind his mind.  Every now and then he wakes up gasping for air but he usually always wakes up twice a night at least to use the bathroom.  He was referred to EP last year for an ICD insertion based on his echocardiogram results with an EF of 25 to 30%.  He was still thinking on whether he wanted an incision.  We discussed manage of his symptoms as well as options for sleeping aids which are available over-the-counter.  We also discussed some few tests to workup his symptoms such as echocardiogram, sleep study, ZIO 3-day monitor for PVC burden.   He will need some dental work done (3 extractions).  He does meet minimal METS and would be fine to go forward with this procedure.  If aspirin can be continued we would prefer this.  If the bleeding risk is too high may hold aspirin for 5 to 7 days prior to extraction been restarted when safe to do so.  Reports no chest pain, pressure, or tightness. No edema, orthopnea, PND.   Past Medical History    Past Medical History:  Diagnosis Date   Hyperlipemia    Hypertension    NSTEMI (non-ST elevated myocardial infarction) ([place])    Pure hypercholesterolemia 03/15/2021   History reviewed. No pertinent surgical history.  Allergies  No Known Allergies  EKGs/Labs/Other Studies Reviewed:   The following studies were reviewed today:  Echocardiogram 08/21/2021 IMPRESSIONS     1. Left ventricular ejection fraction, by estimation, is 25 to 30%. The  left ventricle has severely decreased function. The left ventricle  demonstrates global hypokinesis. There is mild concentric left ventricular  hypertrophy. Left ventricular diastolic   parameters are consistent with Grade I diastolic dysfunction (impaired  relaxation). The average left ventricular global longitudinal strain is  -1.5 %. The global longitudinal strain is abnormal.   2. Right ventricular  systolic function is normal. The right ventricular  size is normal.   3. The mitral valve is normal in structure. Trivial mitral valve  regurgitation. No evidence of mitral stenosis.   4. The aortic valve is normal in structure. Aortic valve regurgitation is  not visualized. No aortic stenosis is present.   5. Aneurysm of the ascending aorta, measuring 41 mm. There is borderline  dilatation of the aortic root, measuring 37 mm.   6. The inferior vena cava is normal in size with greater than 50%  respiratory variability, suggesting right atrial pressure of 3 mmHg.   Comparison(s): EF 20%.   FINDINGS   Left Ventricle: Left ventricular ejection fraction, by estimation, is 25  to 30%. The left ventricle has severely decreased function. The left  ventricle demonstrates global hypokinesis. The average left ventricular  global longitudinal strain is -1.5 %.  The global longitudinal  strain is abnormal. The left ventricular internal  cavity size was normal in size. There is mild concentric left ventricular  hypertrophy. Left ventricular diastolic parameters are consistent with  Grade I diastolic dysfunction  (impaired relaxation).   Right Ventricle: The right ventricular size is normal. No increase in  right ventricular wall thickness. Right ventricular systolic function is  normal.   Left Atrium: Left atrial size was normal in size.   Right Atrium: Right atrial size was normal in size.   Pericardium: There is no evidence of pericardial effusion.   Mitral Valve: The mitral valve is normal in structure. Trivial mitral  valve regurgitation. No evidence of mitral valve stenosis.   Tricuspid Valve: The tricuspid valve is normal in structure. Tricuspid  valve regurgitation is trivial. No evidence of tricuspid stenosis.   Aortic Valve: The aortic valve is normal in structure. Aortic valve  regurgitation is not visualized. No aortic stenosis is present. Aortic  valve mean gradient measures  2.0 mmHg. Aortic valve peak gradient measures  3.9 mmHg. Aortic valve area, by VTI  measures 2.87 cm.   Pulmonic Valve: The pulmonic valve was normal in structure. Pulmonic valve  regurgitation is not visualized. No evidence of pulmonic stenosis.   Aorta: The aortic root is normal in size and structure. There is  borderline dilatation of the aortic root, measuring 37 mm. There is an  aneurysm involving the ascending aorta measuring 41 mm.   Venous: The inferior vena cava is normal in size with greater than 50%  respiratory variability, suggesting right atrial pressure of 3 mmHg.   IAS/Shunts: No atrial level shunt detected by color flow Doppler.   EKG:  EKG is ordered today.  The ekg ordered today demonstrates normal sinus rhythm with occasional PAC, rate 96 bpm  Recent Labs: No results found for requested labs within last 365 days.  Recent Lipid Panel    Component Value Date/Time   CHOL 123 11/23/2020 0344   TRIG 138 11/23/2020 0344   HDL 39 (L) 11/23/2020 0344   CHOLHDL 3.2 11/23/2020 0344   VLDL 28 11/23/2020 0344   LDLCALC 56 11/23/2020 0344   Home Medications   Current Meds  Medication Sig   albuterol (VENTOLIN HFA) 108 (90 Base) MCG/ACT inhaler Inhale 2 puffs into the lungs every 6 (six) hours as needed for wheezing or shortness of breath.   aspirin EC 81 MG EC tablet Take 1 tablet (81 mg total) by mouth daily. Swallow whole.   gabapentin (NEURONTIN) 100 MG capsule Take 100 mg by mouth daily as needed (pain).   Multiple Vitamin (MULTIVITAMIN WITH MINERALS) TABS tablet Take 1 tablet by mouth daily.   Tiotropium Bromide Monohydrate (SPIRIVA RESPIMAT) 1.25 MCG/ACT AERS Inhale 2 puffs into the lungs daily. (Patient taking differently: Inhale 2 puffs into the lungs as needed.)   [DISCONTINUED] atorvastatin (LIPITOR) 40 MG tablet TAKE 1 TABLET BY MOUTH ONCE DAILY IN THE EVENING   [DISCONTINUED] carvedilol (COREG) 3.125 MG tablet Take 1 tablet (3.125 mg total) by mouth daily.    [DISCONTINUED] FARXIGA 10 MG TABS tablet TAKE 1 TABLET BY MOUTH ONCE DAILY BEFORE BREAKFAST   [DISCONTINUED] furosemide (LASIX) 40 MG tablet Take 1 tablet (40 mg total) by mouth daily.   [DISCONTINUED] sacubitril-valsartan (ENTRESTO) 24-26 MG Take 1/2 (one-half) tablet by mouth twice daily. Please schedule appointment for refills.     Review of Systems      All other systems reviewed and are otherwise negative except as noted above.  Physical Exam  VS:  BP (!) 144/78   Pulse 96   Ht 6\' 3"  (1.905 m)   Wt 155 lb 12.8 oz (70.7 kg)   SpO2 100%   BMI 19.47 kg/m  , BMI Body mass index is 19.47 kg/m.  Wt Readings from Last 3 Encounters:  10/24/22 155 lb 12.8 oz (70.7 kg)  08/29/21 165 lb 3.2 oz (74.9 kg)  05/24/21 155 lb 1.6 oz (70.4 kg)     GEN: Well nourished, well developed, in no acute distress. HEENT: normal. Neck: Supple, no JVD, carotid bruits, or masses. Cardiac: RRR frequent skipped beats, no murmurs, rubs, or gallops. No clubbing, cyanosis, edema.  Radials/PT 2+ and equal bilaterally.  Respiratory:  Respirations regular and unlabored, clear to auscultation bilaterally. GI: Soft, nontender, nondistended. MS: No deformity or atrophy. Skin: Warm and dry, no rash. Neuro:  Strength and sensation are intact. Psych: Normal affect.  Assessment & Plan    Chronic combined systolic and diastolic heart failure -euvolemic on exam today -continue current medications including aspirin 81 mg, Lipitor 40 mg, Coreg 3.125 mg twice daily, Zetia 10 mg, Lasix 40 mg, Entresto 24-26 mg (taking a half a tab and we increased to 1 tab), spironolactone 12.5 mg daily. - workup with echocardiogram and referral to EP for ICD - plan for 3-day monitor to workup palpitations -plan for sleep study tomorrow evaluation showed OSA with STOP-BANG of 5  Coronary artery disease -Continue current medication regimen  CKD -Recent creatinine 1.52    Disposition: Follow up 6 weeks with Skeet Latch, MD or APP.  Signed, Elgie Collard, PA-C 10/24/2022, 1:02 PM [place]

## 2022-10-24 ENCOUNTER — Telehealth: Payer: Self-pay | Admitting: *Deleted

## 2022-10-24 ENCOUNTER — Ambulatory Visit: Payer: Medicare (Managed Care) | Attending: Physician Assistant | Admitting: Physician Assistant

## 2022-10-24 ENCOUNTER — Encounter: Payer: Self-pay | Admitting: Physician Assistant

## 2022-10-24 ENCOUNTER — Ambulatory Visit: Payer: Medicare (Managed Care)

## 2022-10-24 VITALS — BP 144/78 | HR 96 | Ht 75.0 in | Wt 155.8 lb

## 2022-10-24 DIAGNOSIS — I5042 Chronic combined systolic (congestive) and diastolic (congestive) heart failure: Secondary | ICD-10-CM | POA: Diagnosis not present

## 2022-10-24 DIAGNOSIS — E43 Unspecified severe protein-calorie malnutrition: Secondary | ICD-10-CM

## 2022-10-24 DIAGNOSIS — R002 Palpitations: Secondary | ICD-10-CM

## 2022-10-24 DIAGNOSIS — R400 Somnolence: Secondary | ICD-10-CM

## 2022-10-24 DIAGNOSIS — N1832 Chronic kidney disease, stage 3b: Secondary | ICD-10-CM | POA: Diagnosis not present

## 2022-10-24 DIAGNOSIS — I2510 Atherosclerotic heart disease of native coronary artery without angina pectoris: Secondary | ICD-10-CM | POA: Diagnosis not present

## 2022-10-24 MED ORDER — FUROSEMIDE 40 MG PO TABS: 40.0000 mg | ORAL_TABLET | Freq: Every day | ORAL | 3 refills | Status: DC

## 2022-10-24 MED ORDER — SPIRONOLACTONE 25 MG PO TABS: 12.5000 mg | ORAL_TABLET | Freq: Every day | ORAL | 3 refills | Status: DC

## 2022-10-24 MED ORDER — ATORVASTATIN CALCIUM 40 MG PO TABS: 40.0000 mg | ORAL_TABLET | Freq: Every evening | ORAL | 3 refills | Status: DC

## 2022-10-24 MED ORDER — DAPAGLIFLOZIN PROPANEDIOL 10 MG PO TABS: 10.0000 mg | ORAL_TABLET | Freq: Every day | ORAL | 3 refills | Status: DC

## 2022-10-24 MED ORDER — ENTRESTO 24-26 MG PO TABS: ORAL_TABLET | ORAL | 3 refills | Status: DC

## 2022-10-24 MED ORDER — CARVEDILOL 3.125 MG PO TABS: 3.1250 mg | ORAL_TABLET | Freq: Every day | ORAL | 3 refills | Status: DC

## 2022-10-24 NOTE — Telephone Encounter (Signed)
Pt was seen in the office today by Nicholes Rough, [facility] who ordered an Itamar study. Pt agreeable to signed waiver. Pt aware to not open the box until he has been called and given the PIN#.

## 2022-10-24 NOTE — Progress Notes (Unsigned)
Enrolled patient for a 3 day Zio XT monitor to be mailed to patients home  Dr Oval Linsey to read

## 2022-10-24 NOTE — Patient Instructions (Addendum)
Medication Instructions:  Increase entresto 24-26 mg to 1 full tablet twice daily *If you need a refill on your cardiac medications before your next appointment, please call your pharmacy*   Lab Work: None If you have labs (blood work) drawn today and your tests are completely normal, you will receive your results only by: [place] (if you have MyChart) OR A paper copy in the mail If you have any lab test that is abnormal or we need to change your treatment, we will call you to review the results.   Testing/Procedures: Your physician has requested that you have an echocardiogram. Echocardiography is a painless test that uses sound waves to create images of your heart. It provides your doctor with information about the size and shape of your heart and how well your heart's chambers and valves are working. This procedure takes approximately one hour. There are no restrictions for this procedure. Please do NOT wear cologne, perfume, aftershave, or lotions (deodorant is allowed). Please arrive 15 minutes prior to your appointment time.   Your physician has recommended that you have an at home itamar sleep study. This test records several body functions during sleep, including: brain activity, eye movement, oxygen and carbon dioxide blood levels, heart rate and rhythm, breathing rate and rhythm, the flow of air through your mouth and nose, snoring, body muscle movements, and chest and belly movement.   ZIO XT- Long Term Monitor Instructions  Your physician has requested you wear a ZIO patch monitor for 3 days.  This is a single patch monitor. [employer] supplies one patch monitor per enrollment. Additional stickers are not available. Please do not apply patch if you will be having a Nuclear Stress Test,  Echocardiogram, Cardiac CT, MRI, or Chest Xray during the period you would be wearing the  monitor. The patch cannot be worn during these tests. You cannot remove and re-apply the  ZIO XT  patch monitor.  Your ZIO patch monitor will be mailed 3 day USPS to your address on file. It may take 3-5 days  to receive your monitor after you have been enrolled.  Once you have received your monitor, please review the enclosed instructions. Your monitor  has already been registered assigning a specific monitor serial # to you.  Billing and Patient Assistance Program Information  We have supplied [employer] with any of your insurance information on file for billing purposes. [employer] offers a sliding scale Patient Assistance Program for patients that do not have  insurance, or whose insurance does not completely cover the cost of the ZIO monitor.  You must apply for the Patient Assistance Program to qualify for this discounted rate.  To apply, please call [employer] at [phone], select option 4, select option 2, ask to apply for  Patient Assistance Program. Theodore Demark will ask your household income, and how many people  are in your household. They will quote your out-of-pocket cost based on that information.  [employer] will also be able to set up a 61-month, interest-free payment plan if needed.  Applying the monitor   Shave hair from upper left chest.  Hold abrader disc by orange tab. Rub abrader in 40 strokes over the upper left chest as  indicated in your monitor instructions.  Clean area with 4 enclosed alcohol pads. Let dry.  Apply patch as indicated in monitor instructions. Patch will be placed under collarbone on left  side of chest with arrow pointing upward.  Rub patch adhesive wings for 2 minutes. Remove white label  marked "1". Remove the white  label marked "2". Rub patch adhesive wings for 2 additional minutes.  While looking in a mirror, press and release button in center of patch. A small green light will  flash 3-4 times. This will be your only indicator that the monitor has been turned on.  Do not shower for the first 24 hours. You may shower after the first 24 hours.  Press  the button if you feel a symptom. You will hear a small click. Record Date, Time and  Symptom in the Patient Logbook.  When you are ready to remove the patch, follow instructions on the last 2 pages of Patient  Logbook. Stick patch monitor onto the last page of Patient Logbook.  Place Patient Logbook in the blue and white box. Use locking tab on box and tape box closed  securely. The blue and white box has prepaid postage on it. Please place it in the mailbox as  soon as possible. Your physician should have your test results approximately 7 days after the  monitor has been mailed back to [facility].  Call [place] at [phone] if you have questions regarding  your ZIO XT patch monitor. Call them immediately if you see an orange light blinking on your  monitor.  If your monitor falls off in less than 4 days, contact our Monitor department at [phone].  If your monitor becomes loose or falls off after 4 days call [employer] at [phone] for  suggestions on securing your monitor    Follow-Up: At [facility], you and your health needs are our priority.  As part of our continuing mission to provide you with exceptional heart care, we have created designated Provider Care Teams.  These Care Teams include your primary Cardiologist (physician) and Advanced Practice Providers (APPs -  Physician Assistants and Nurse Practitioners) who all work together to provide you with the care you need, when you need it.  We recommend signing up for the patient portal called "MyChart".  Sign up information is provided on this After Visit Summary.  MyChart is used to connect with patients for Virtual Visits (Telemedicine).  Patients are able to view lab/test results, encounter notes, upcoming appointments, etc.  Non-urgent messages can be sent to your provider as well.   To learn more about what you can do with MyChart, go to [email or web address].    Your next  appointment:   6 week(s)  Provider:   Skeet Latch, MD or APP  You have been referred to see an electrophysiologist here in our office in 2 weeks   Other Instructions Check your blood pressure daily, one hour after taking morning medications for 2 weeks, keep a log and call us with the readings at the end of the 2 weeks

## 2022-10-25 NOTE — Telephone Encounter (Signed)
Left detailed message for the pt that he is ok to proceed with the Itamar study and left PIN# [id number]. Left message if he could study by sometime by early next week. Left message to call me back and confirm he received my message.

## 2022-10-25 NOTE — Telephone Encounter (Signed)
Prior Authorization for [facility] sent to [employer] via web portal. Tracking Number [id number]  [id number] PA REQ

## 2022-11-04 ENCOUNTER — Encounter: Payer: Self-pay | Admitting: Physician Assistant

## 2022-11-14 ENCOUNTER — Telehealth (HOSPITAL_BASED_OUTPATIENT_CLINIC_OR_DEPARTMENT_OTHER): Payer: Self-pay

## 2022-11-14 NOTE — Telephone Encounter (Signed)
   Primary Cardiologist: [name], MD  Chart reviewed as part of pre-operative protocol coverage. Given past medical history and time since last visit, based on ACC/AHA guidelines, Cyruss Arata Arakelian would be at acceptable risk for the planned procedure without further cardiovascular testing.   Patient was advised that if he develops new symptoms prior to surgery to contact our office to arrange a follow-up appointment.  He verbalized understanding.  Ideally aspirin should be continued without interruption, however if the bleeding risk is too great, aspirin may be held for 5-7 days prior to surgery. Please resume aspirin post operatively when it is felt to be safe from a bleeding standpoint.   I will route this recommendation to the requesting party via Epic fax function and remove from pre-op pool.  Please call with questions.  Levi Aland, NP-C  11/14/2022, 2:16 PM [address], [address] Office [phone] Fax [phone]

## 2022-11-14 NOTE — Telephone Encounter (Signed)
   Pre-operative Risk Assessment    Patient Name: Joseph Tanner  DOB: Aug 16, 1950 MRN: [id number]     Request for Surgical Clearance    Procedure:  Dental Extraction - Amount of Teeth to be Pulled:  3  Date of Surgery:  Clearance 11/14/22                                 Surgeon:  Beatrice Lecher, DDS, PA Surgeon's Group or Practice Name:  [facility] DDS, [facility] Phone number:  [phone] Fax number:  [phone]   Type of Clearance Requested:   - Medical    Type of Anesthesia:   Not specified   Additional requests/questions:   Same as clearance requested 10/21/22  SignedRodriques, Badie   11/14/2022, 1:35 PM

## 2022-11-21 ENCOUNTER — Ambulatory Visit (HOSPITAL_COMMUNITY): Payer: Medicare (Managed Care) | Attending: Physician Assistant

## 2022-11-21 DIAGNOSIS — I5042 Chronic combined systolic (congestive) and diastolic (congestive) heart failure: Secondary | ICD-10-CM | POA: Diagnosis present

## 2022-11-21 NOTE — Telephone Encounter (Signed)
Patient would like to be changed to an in lab sleep study as his phone is not compatible with the itamar app. The app was put on his daughters phone but she does not live with him and can not be there when he goes to bed and administers the test.

## 2022-11-22 LAB — ECHOCARDIOGRAM COMPLETE
Area-P 1/2: 5.07 cm2
Est EF: <20
S' Lateral: 5.9 cm

## 2022-11-25 ENCOUNTER — Other Ambulatory Visit: Payer: Self-pay | Admitting: *Deleted

## 2022-11-25 DIAGNOSIS — R400 Somnolence: Secondary | ICD-10-CM

## 2022-11-25 NOTE — Telephone Encounter (Signed)
In lab sleep study order has been placed.

## 2022-12-09 IMAGING — US US RENAL
1 series · 14 of 25 positions shown · non-contrast
Comparison: None.

CLINICAL DATA: Nephrolithiasis.

EXAM:
RENAL / URINARY TRACT ULTRASOUND COMPLETE

[Series 1: us renal · 14 of 54 slices shown]
[im 1/54]
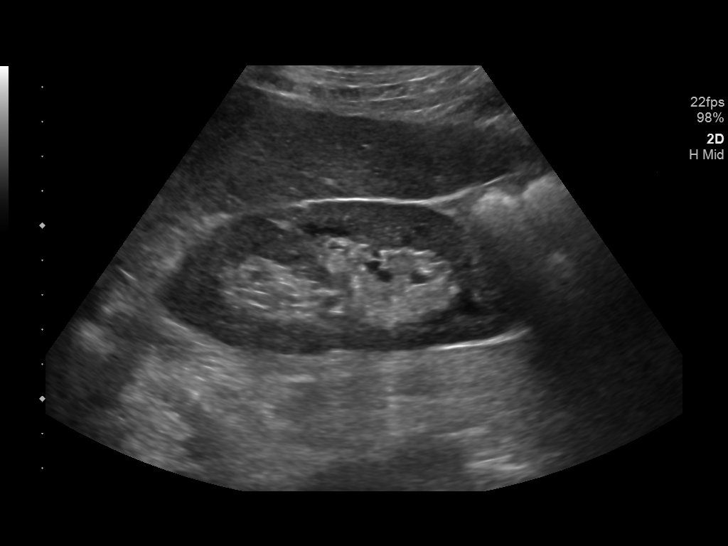
[im 5/54]
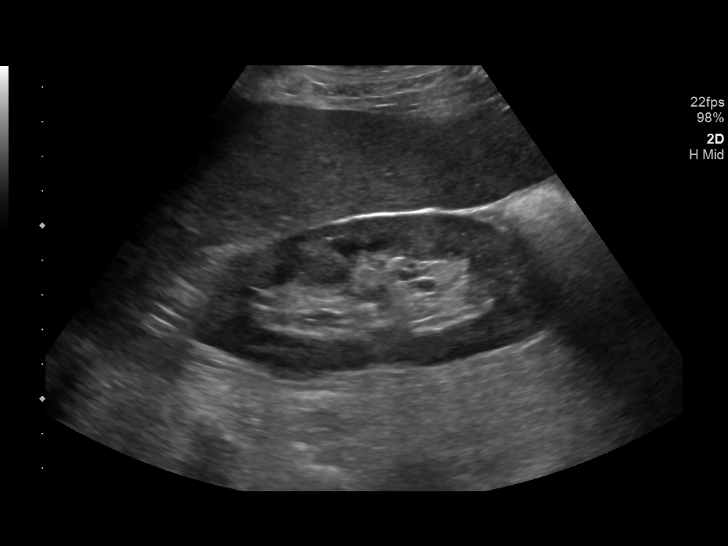
[im 9/54]
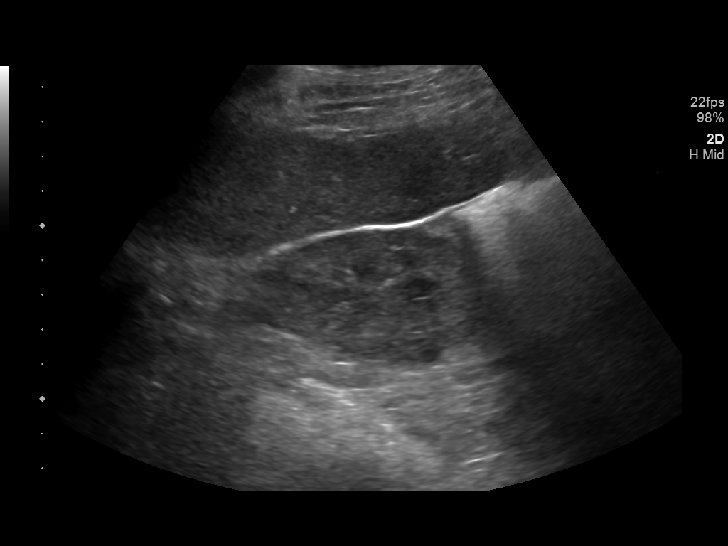
[im 14/54]
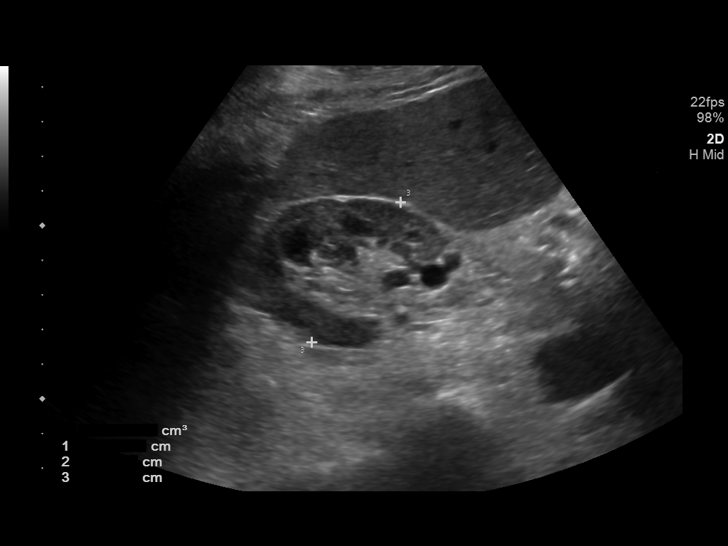
[im 18/54]
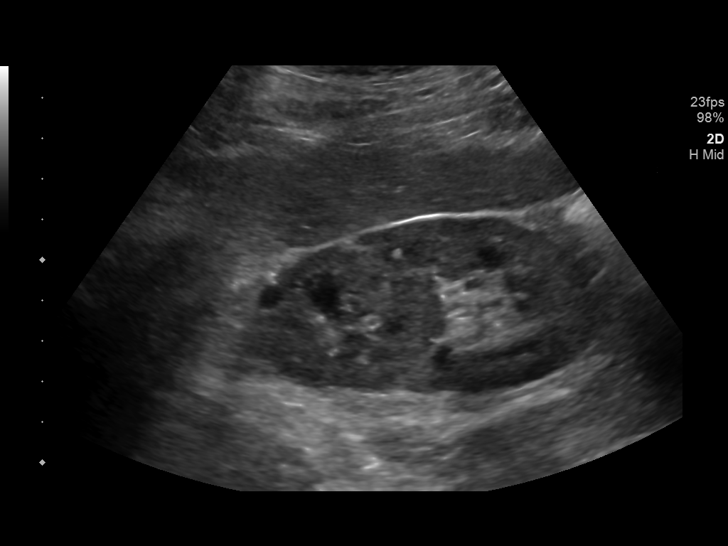
[im 20/54]
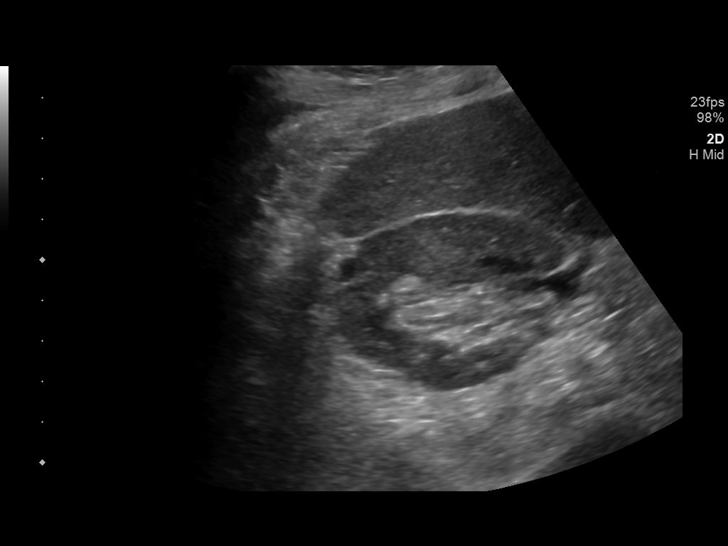
[im 25/54]
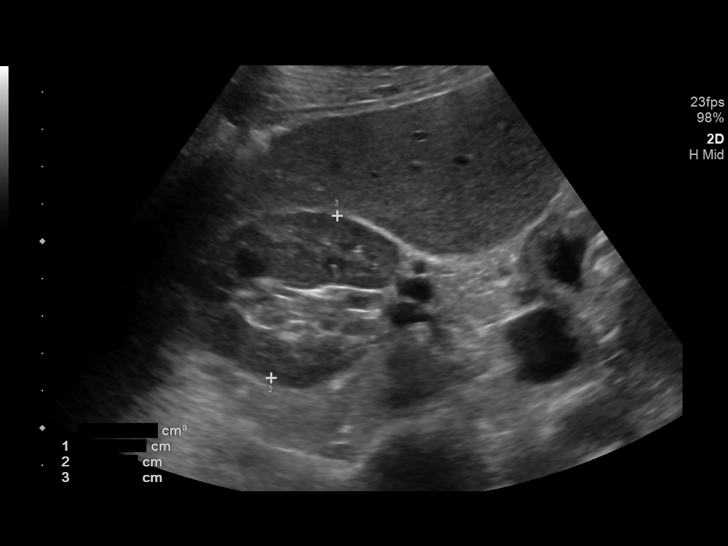
[im 29/54]
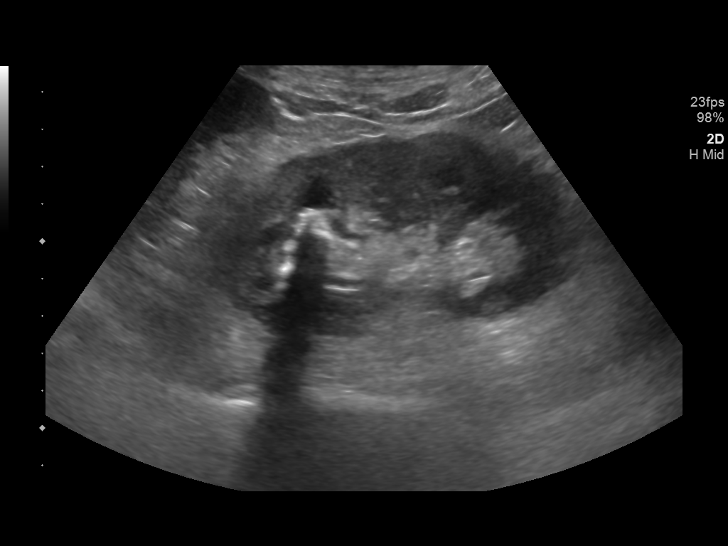
[im 34/54]
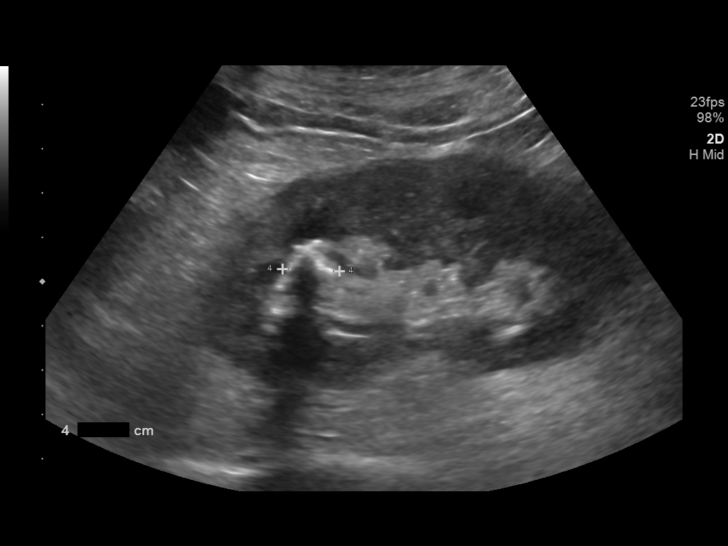
[im 36/54]
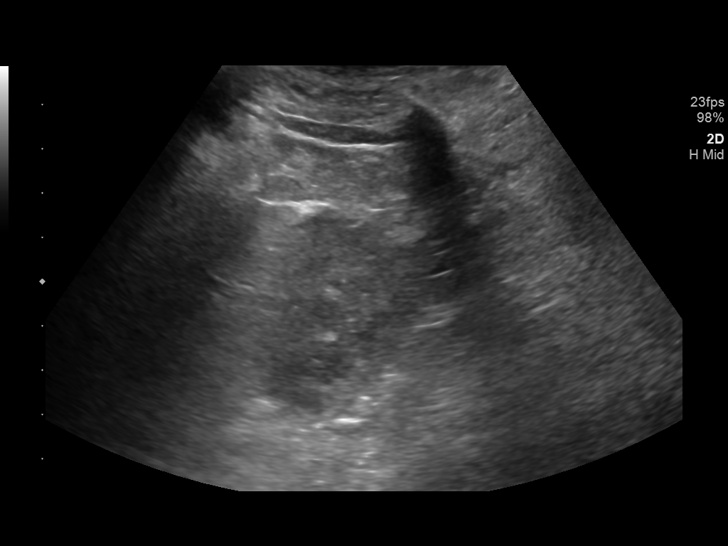
[im 40/54]
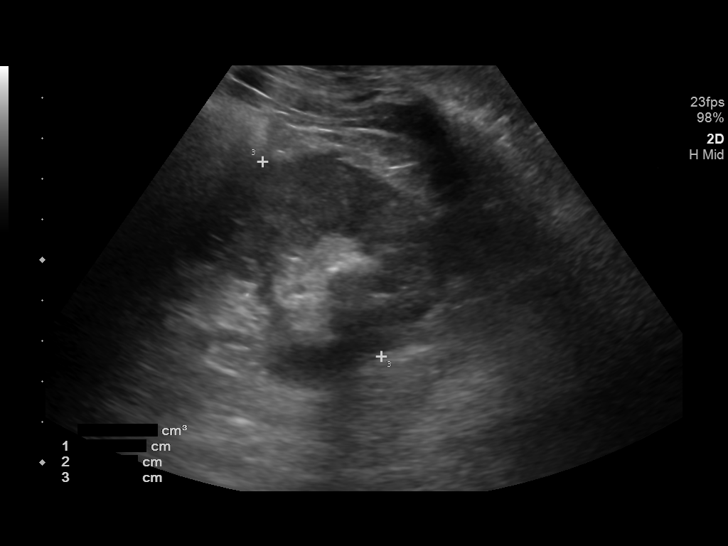
[im 45/54]
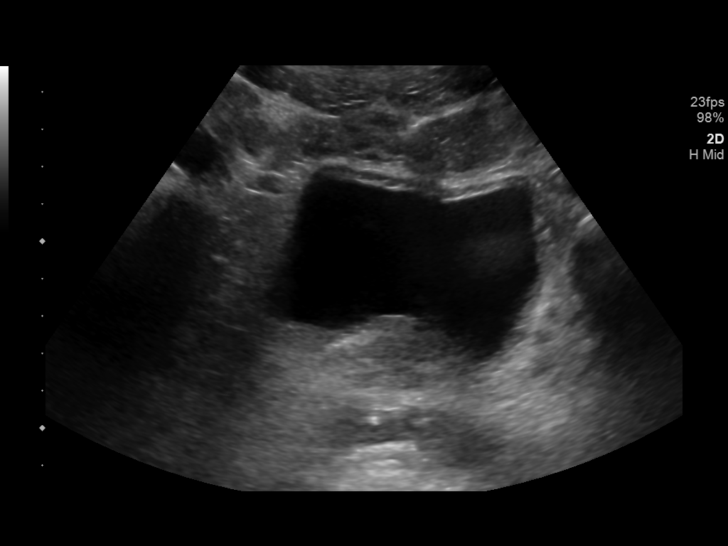
[im 49/54]
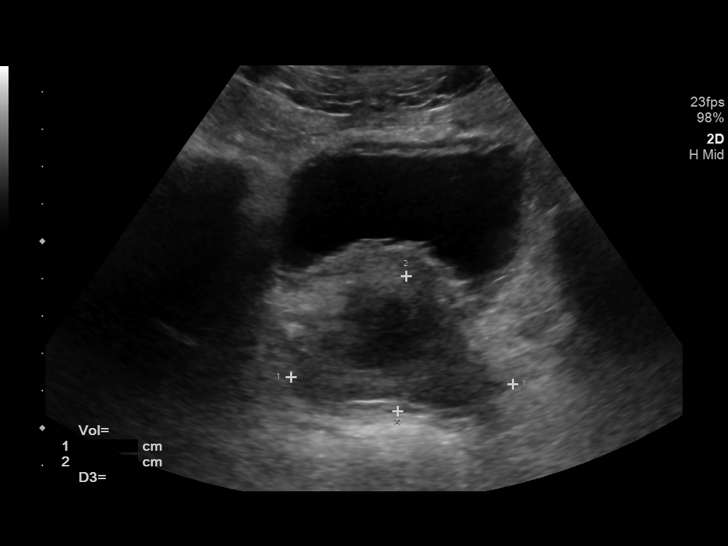
[im 54/54]
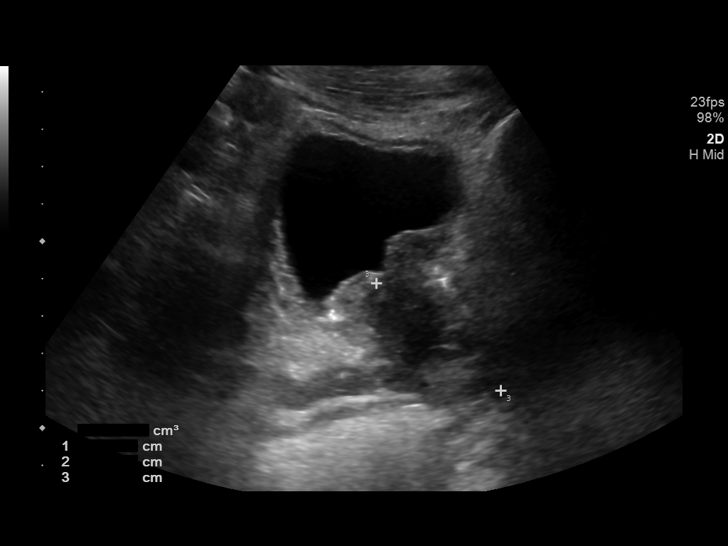

[14 of 25 positions shown; findings below may reference images not displayed]

FINDINGS: Right Kidney:

Renal measurements: 10.7 x 4.4 x 4.8 cm = volume: 116.8 mL.
Echogenicity within normal limits. 0.7 cm simple cyst. No
hydronephrosis visualized.

Left Kidney:

Renal measurements: 10.3 x 4.8 x 5.6 cm = volume: 146.2 mL.
Echogenicity within normal limits. No mass or hydronephrosis
visualized. 1.3 cm nonobstructing calyceal stone.

Bladder:

Bladder is nondistended. Mild thickening of the bladder wall,
possibly related to hypertrophy secondary to prostate enlargement.
Bladder pathology cannot be excluded.

Other:

Prostate is enlarged at 4.4 x 3.6 x 5.9 cm.
IMPRESSION: 1.  0.7 cm right renal simple cyst.

2.  1.3 cm nonobstructing left renal calyceal stone.

3. Prostate is enlarged. Mild thickening of the bladder wall,
possibly related upper tree secondary to prostate enlargement.
Bladder wall pathology cannot be excluded.

## 2022-12-10 ENCOUNTER — Ambulatory Visit (HOSPITAL_BASED_OUTPATIENT_CLINIC_OR_DEPARTMENT_OTHER): Payer: Medicare (Managed Care) | Admitting: Family

## 2023-01-05 NOTE — Progress Notes (Deleted)
Cardiology Office Note:    Date:  01/05/2023   ID:  Constance Holster Chaloux, DOB October 27, 1950, MRN [id number]  PCP:  Aviva Kluver  [facility] Providers Cardiologist:  [name], MD { Click to update primary MD,subspecialty MD or APP then REFRESH:1}  *** Referring MD: No ref. provider found   Chief Complaint:  No chief complaint on file. 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 LHC 2022 with severe stenosis of a small left circumflex and mild to moderate stenosis in the RCA which was large and heavily calcified  Hypertension Hyperlipidemia COPD Alcohol use  Cardiac Studies & Procedures       ECHOCARDIOGRAM  ECHOCARDIOGRAM COMPLETE 11/22/2022  Narrative ECHOCARDIOGRAM REPORT    Patient Name:   Joseph Tanner [facility] Date of Exam: 11/21/2022 Medical Rec #:  [id number]               Height:       75.0 in Accession #:    [id number]              Weight:       155.8 lb Date of Birth:  1951-04-01                BSA:          1.973 m Patient Age:    72 years                BP:           144/7 mmHg Patient Gender: M                       HR:           71 bpm. Exam Location:  [address]  Procedure: 2D Echo, 3D Echo, Cardiac Doppler, Color Doppler and Strain Analysis  Indications:    I50.42 Chronic Combined Systolic and Diastolic Heart Failure  History:        Patient has prior history of Echocardiogram examinations, most recent 08/21/2021. CAD; Risk Factors:Hypertension and Dyslipidemia. NSTEMI.  Sonographer:    Sedonia Small Rodgers-Jones RDCS Referring Phys: [id number] TESSA N CONTE  IMPRESSIONS   1. There are mildly prominent trabecualtions in the LV apex. Consider cardiac MRI to further evaluate.. Left ventricular ejection fraction, by estimation, is <20%. The left ventricle has severely decreased function. The left ventricle demonstrates global hypokinesis. The left  ventricular internal cavity size was severely dilated. Left ventricular diastolic parameters are consistent with Grade III diastolic dysfunction (restrictive). 2. Right ventricular systolic function is severely reduced. The right ventricular size is normal. There is moderately elevated pulmonary artery systolic pressure. The estimated right ventricular systolic pressure is 55.2 mmHg. 3. Left atrial size was moderately dilated. 4. Right atrial size was severely dilated. 5. The mitral valve is normal in structure. Trivial mitral valve regurgitation. No evidence of mitral stenosis. 6. Tricuspid valve regurgitation is moderate. 7. The aortic valve is normal in structure. Aortic valve regurgitation is trivial. No aortic stenosis is present. 8. Pulmonic valve regurgitation is moderate. 9. There is mild dilatation of the ascending aorta, measuring 40 mm. 10. The inferior vena cava is dilated in size with <50% respiratory variability, suggesting right atrial pressure of 15 mmHg.  FINDINGS Left Ventricle: There are mildly prominent trabecualtions in the LV apex. Consider cardiac MRI to further evaluate. Left ventricular ejection fraction, by estimation, is <  20%. The left ventricle has severely decreased function. The left ventricle demonstrates global hypokinesis. The left ventricular internal cavity size was severely dilated. There is no left ventricular hypertrophy. Left ventricular diastolic parameters are consistent with Grade III diastolic dysfunction (restrictive).  Right Ventricle: The right ventricular size is normal. No increase in right ventricular wall thickness. Right ventricular systolic function is severely reduced. There is moderately elevated pulmonary artery systolic pressure. The tricuspid regurgitant velocity is 3.17 m/s, and with an assumed right atrial pressure of 15 mmHg, the estimated right ventricular systolic pressure is 55.2 mmHg.  Left Atrium: Left atrial size was moderately  dilated.  Right Atrium: Right atrial size was severely dilated.  Pericardium: There is no evidence of pericardial effusion.  Mitral Valve: The mitral valve is normal in structure. Trivial mitral valve regurgitation. No evidence of mitral valve stenosis.  Tricuspid Valve: The tricuspid valve is normal in structure. Tricuspid valve regurgitation is moderate . No evidence of tricuspid stenosis.  Aortic Valve: The aortic valve is normal in structure. Aortic valve regurgitation is trivial. No aortic stenosis is present.  Pulmonic Valve: The pulmonic valve was normal in structure. Pulmonic valve regurgitation is moderate. No evidence of pulmonic stenosis.  Aorta: The aortic root is normal in size and structure. There is mild dilatation of the ascending aorta, measuring 40 mm.  Venous: The inferior vena cava is dilated in size with less than 50% respiratory variability, suggesting right atrial pressure of 15 mmHg.  IAS/Shunts: No atrial level shunt detected by color flow Doppler.   LEFT VENTRICLE PLAX 2D LVIDd:         6.80 cm   Diastology LVIDs:         5.90 cm   LV e' medial:    3.26 cm/s LV PW:         0.80 cm   LV E/e' medial:  21.9 LV IVS:        0.80 cm   LV e' lateral:   8.10 cm/s LVOT diam:     2.30 cm   LV E/e' lateral: 8.8 LV SV:         20 LV SV Index:   10        2D Longitudinal Strain LVOT Area:     4.15 cm  2D Strain GLS (A2C):   -12.0 % 2D Strain GLS (A3C):   -14.9 % 2D Strain GLS (A4C):   -13.1 % 2D Strain GLS Avg:     -13.3 %  3D Volume EF: 3D EF:        21 % LV EDV:       298 ml LV ESV:       236 ml LV SV:        62 ml  RIGHT VENTRICLE            IVC RV Basal diam:  5.30 cm    IVC diam: 2.30 cm RV S prime:     6.24 cm/s TAPSE (M-mode): 1.8 cm  LEFT ATRIUM             Index        RIGHT ATRIUM           Index LA diam:        5.20 cm 2.64 cm/m   RA Area:     25.10 cm LA Vol (A2C):   78.1 ml 39.58 ml/m  RA Volume:   90.10 ml  45.66 ml/m LA Vol (A4C):    96.8 ml  49.05 ml/m LA Biplane Vol: 90.0 ml 45.61 ml/m AORTIC VALVE LVOT Vmax:   29.40 cm/s LVOT Vmean:  18.267 cm/s LVOT VTI:    0.048 m  AORTA Ao Root diam: 3.50 cm Ao Asc diam:  4.00 cm  MITRAL VALVE               TRICUSPID VALVE MV Area (PHT): 5.07 cm    TR Peak grad:   40.2 mmHg MV Decel Time: 150 msec    TR Vmax:        317.00 cm/s MV E velocity: 71.30 cm/s MV A velocity: 18.60 cm/s  SHUNTS MV E/A ratio:  3.83        Systemic VTI:  0.05 m Systemic Diam: 2.30 cm  Arvilla Meres MD Electronically signed by Arvilla Meres MD Signature Date/Time: 11/22/2022/10:45:50 AM    Final              History of Present Illness:   Genie Scheler Plush is a 72 y.o. male with the above problem list.  He presents today for delayed 6 week follow up after seeing Jari Favre, PA-C on 10/24/22. At that visit, patient reported worsening lower extremity edema and shortness of breath including nocturnal dyspnea. Given intermittent edema and dyspnea, patient was arranged to have a repeat TTE, sleep study, and 3 day Zio patch to evaluate palpitations. Per notes, patient was also supposed to be referred to EP for consideration of ICD. TTE showed LVEF reduced to less than 20%. Zio appears to have not been completed, and patient has yet to be scheduled for sleep study. It also appears that there was recommendation for urgent CHF clinic evaluation based on LVEF but it appears that patient was not reached to confirm this referral **.   Of note, patient previously referred to EP for ICD evaluation, still has not been seen. Patient with history of medication non-adherence as well as affordability concerns.      Past Medical History:  Diagnosis Date   Hyperlipemia    Hypertension    NSTEMI (non-ST elevated myocardial infarction) (HCC)    Pure hypercholesterolemia 03/15/2021   Current Medications: No outpatient medications have been marked as taking for the 01/06/23 encounter (Appointment) with  Perlie Gold, PA-C.    Allergies:   Patient has no known allergies.   Social History   Occupational History   Not on file  Tobacco Use   Smoking status: Some Days   Smokeless tobacco: Never  Substance and Sexual Activity   Alcohol use: Yes   Drug use: Never   Sexual activity: Not on file    Family Hx: The patient's family history includes Heart failure in his maternal grandfather, maternal grandmother, mother, paternal grandfather, and paternal grandmother.  ROS   EKGs/Labs/Other Test Reviewed:    EKG:  EKG is *** ordered today.  The ekg ordered today demonstrates ***   Recent Labs: No results found for requested labs within last 365 days.   Recent Lipid Panel No results for input(s): "CHOL", "TRIG", "HDL", "VLDL", "LDLCALC", "LDLDIRECT" in the last 8760 hours.    Risk Assessment/Calculations/Metrics:   {Does this patient have ATRIAL FIBRILLATION?:[phone]} STOP-Bang Score:  5  { Consider Dx Sleep Disordered Breathing or Sleep Apnea  ICD G47.33          :1}    No BP recorded.  {Refresh Note OR Click here to enter BP  :1}***    Physical Exam:    VS:  There were no vitals taken for  this visit.    Wt Readings from Last 3 Encounters:  10/24/22 155 lb 12.8 oz (70.7 kg)  08/29/21 165 lb 3.2 oz (74.9 kg)  05/24/21 155 lb 1.6 oz (70.4 kg)    Physical Exam ***     ASSESSMENT & PLAN:   No problem-specific Assessment & Plan notes found for this encounter.        {Are you ordering a CV Procedure (e.g. stress test, cath, DCCV, TEE, etc)?   Press F2        :[id number]}   Dispo:  No follow-ups on file.   Medication Adjustments/Labs and Tests Ordered: Current medicines are reviewed at length with the patient today.  Concerns regarding medicines are outlined above.  Tests Ordered: No orders of the defined types were placed in this encounter.  Medication Changes: No orders of the defined types were placed in this encounter.  Signed, Perlie Gold, PA-C  01/05/2023  3:11 PM    [facility] [address] [place], [place], [place]  [address] Phone: [phone]; Fax: [phone]

## 2023-01-06 ENCOUNTER — Ambulatory Visit: Payer: Medicare (Managed Care) | Admitting: Cardiology

## 2023-01-28 ENCOUNTER — Ambulatory Visit (HOSPITAL_BASED_OUTPATIENT_CLINIC_OR_DEPARTMENT_OTHER): Payer: Medicare (Managed Care) | Admitting: Family

## 2023-01-29 ENCOUNTER — Other Ambulatory Visit: Payer: Self-pay

## 2023-01-29 MED ORDER — CARVEDILOL 3.125 MG PO TABS: 3.1250 mg | ORAL_TABLET | Freq: Every day | ORAL | 2 refills | Status: DC

## 2023-01-29 MED ORDER — SPIRONOLACTONE 25 MG PO TABS: 12.5000 mg | ORAL_TABLET | Freq: Every day | ORAL | 2 refills | Status: DC

## 2023-01-29 MED ORDER — DAPAGLIFLOZIN PROPANEDIOL 10 MG PO TABS: 10.0000 mg | ORAL_TABLET | Freq: Every day | ORAL | 2 refills | Status: DC

## 2023-01-29 MED ORDER — ENTRESTO 24-26 MG PO TABS: ORAL_TABLET | ORAL | 2 refills | Status: DC

## 2023-01-29 MED ORDER — FUROSEMIDE 40 MG PO TABS: 40.0000 mg | ORAL_TABLET | Freq: Every day | ORAL | 2 refills | Status: DC

## 2023-01-29 MED ORDER — ATORVASTATIN CALCIUM 40 MG PO TABS: 40.0000 mg | ORAL_TABLET | Freq: Every evening | ORAL | 2 refills | Status: DC

## 2023-01-31 ENCOUNTER — Other Ambulatory Visit: Payer: Self-pay

## 2023-01-31 MED ORDER — CARVEDILOL 3.125 MG PO TABS: 3.1250 mg | ORAL_TABLET | Freq: Two times a day (BID) | ORAL | 2 refills | Status: DC

## 2023-01-31 NOTE — Telephone Encounter (Signed)
[facility] is stating that pt's medication is typically dosed BID. Pharmacy would like a clarification if prescriber intended 3/125 mg once daily dosing. Please address

## 2023-02-03 ENCOUNTER — Telehealth: Payer: Self-pay | Admitting: Cardiovascular Disease

## 2023-02-03 MED ORDER — SPIRONOLACTONE 25 MG PO TABS: 12.5000 mg | ORAL_TABLET | Freq: Every day | ORAL | 3 refills | Status: DC

## 2023-02-03 NOTE — Telephone Encounter (Signed)
Chart reviewed, patient is to be taking spiro half tablet daily. VM left with instructions. Current Rx sent back to pharmacy. Left detailed message for patient ( ok per DPR).

## 2023-02-03 NOTE — Telephone Encounter (Signed)
Pt c/o medication issue:  1. Name of Medication: spironolactone (ALDACTONE) 25 MG tablet   2. How are you currently taking this medication (dosage and times per day)? Take 0.5 tablets (12.5 mg total) by mouth daily.   3. Are you having a reaction (difficulty breathing--STAT)? Na   4. What is your medication issue? Pt told the pharmacy that he is taking 1 whole tab and not a 1/2   [facility] [phone]

## 2023-02-21 IMAGING — DX DG CHEST 2V
2 series · 2 of 2 positions shown · non-contrast
Comparison: 11/20/2020

CLINICAL DATA: Chest pain, shortness of breath

EXAM:
CHEST - 2 VIEW

[chest pa]
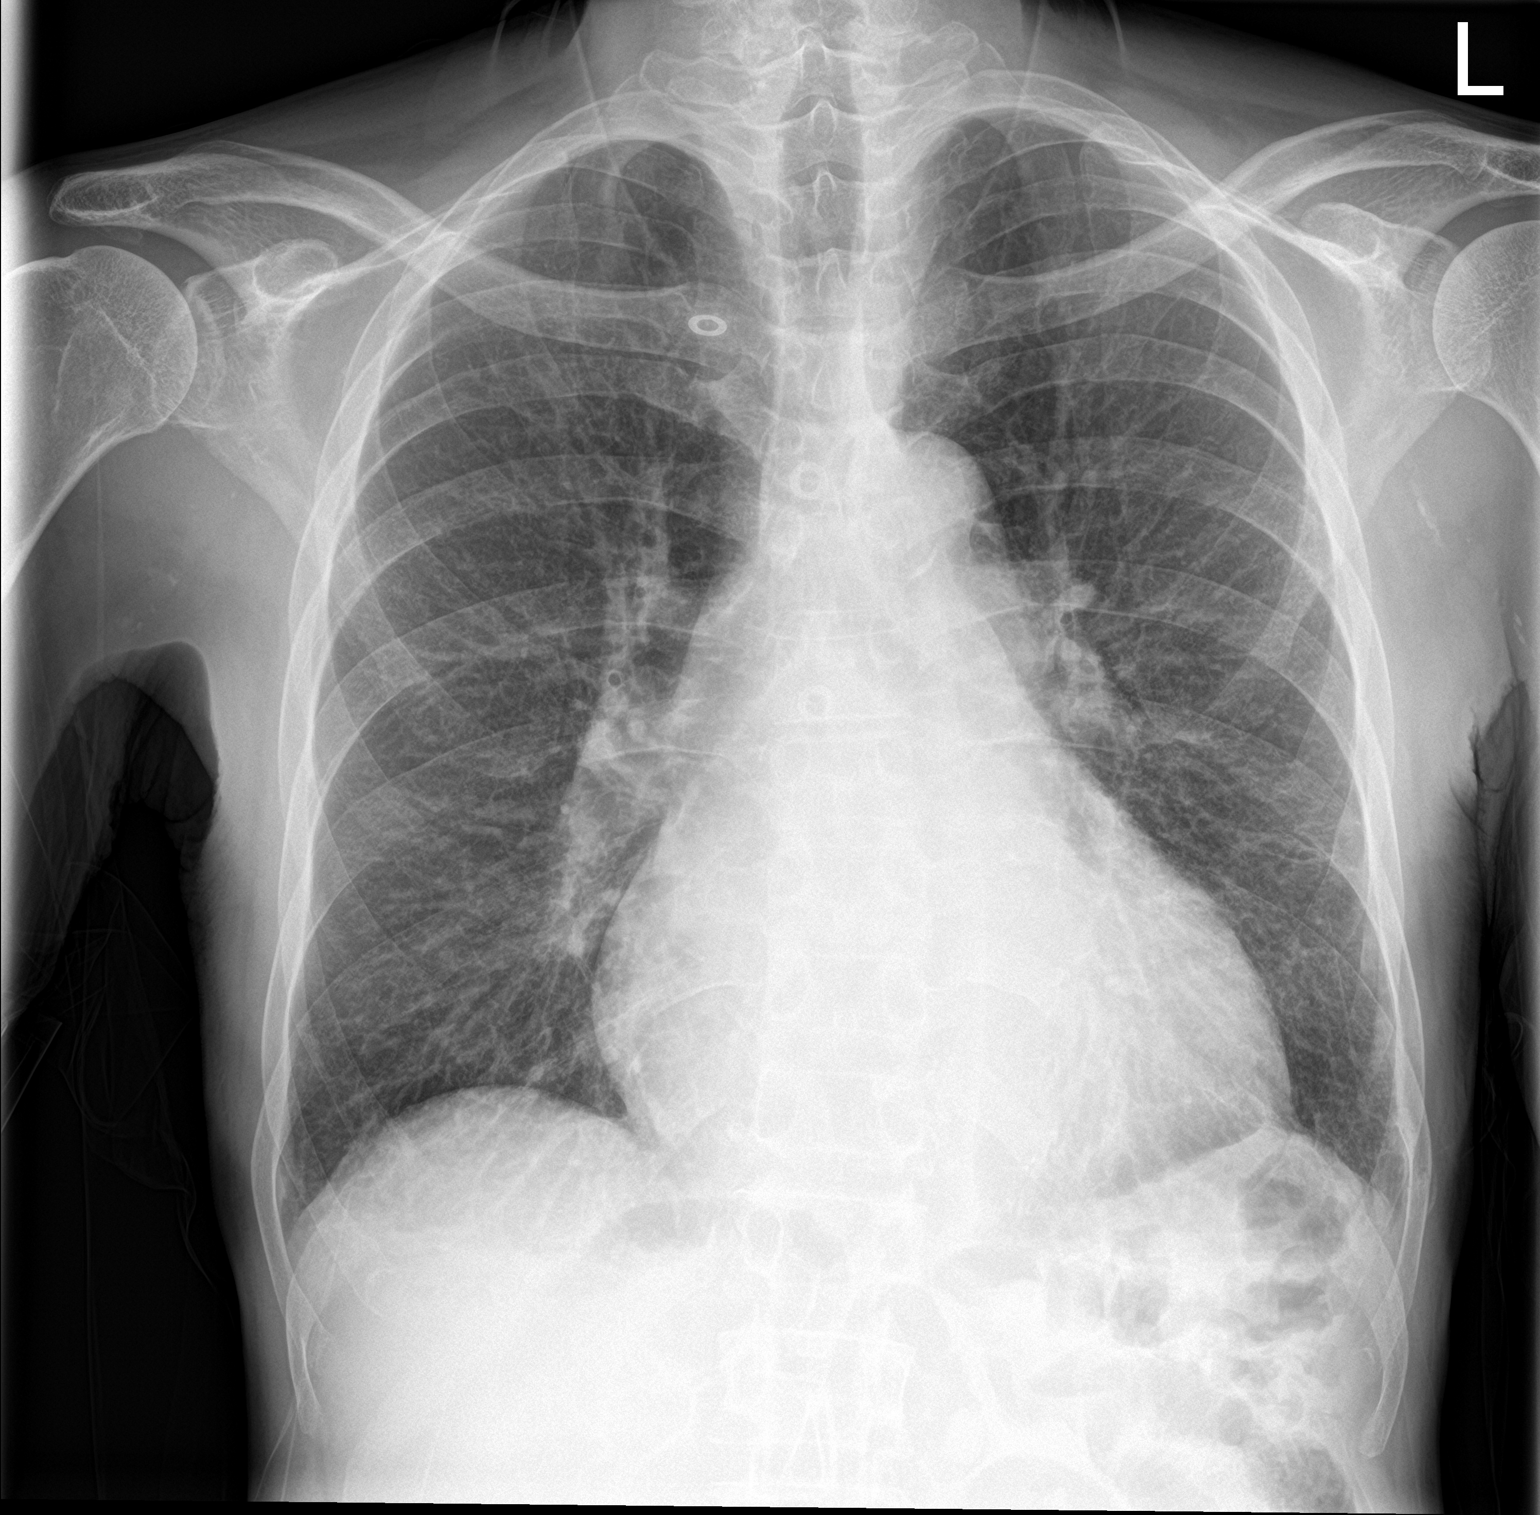

[chest lat]
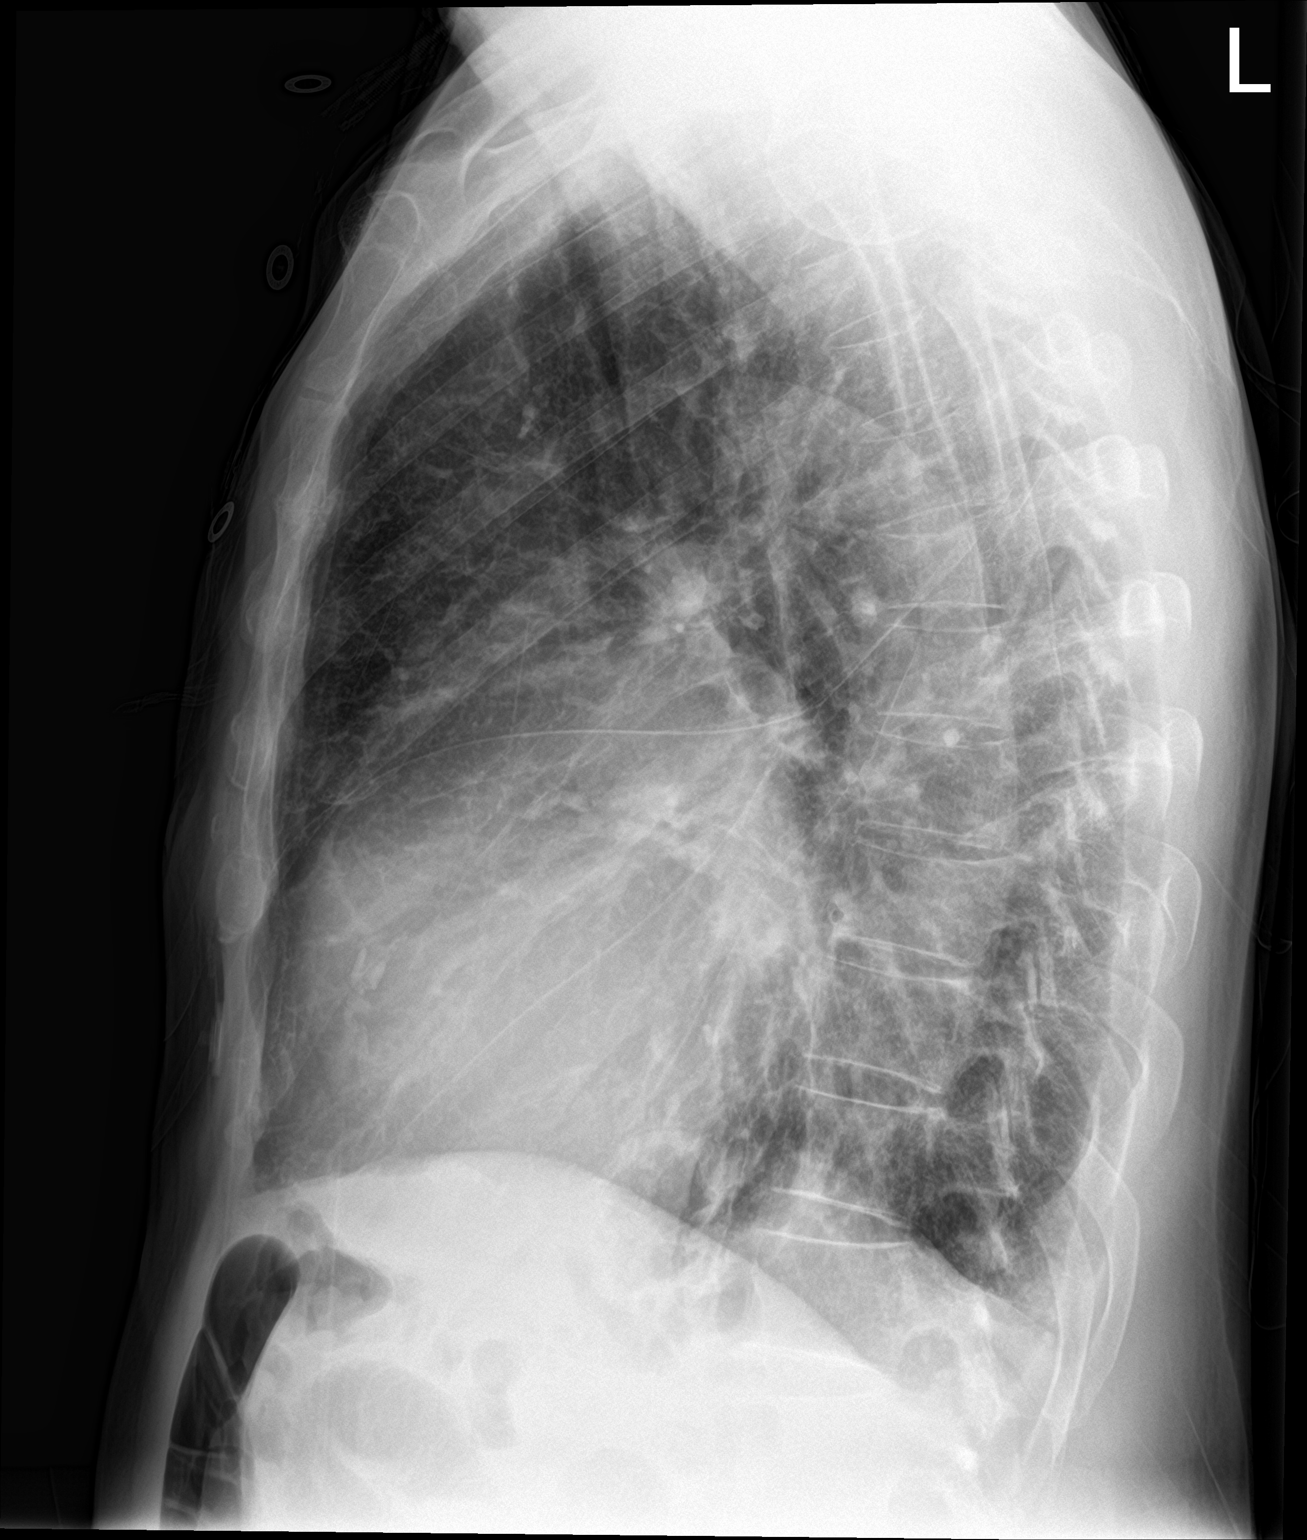

[2 of 2 positions shown; findings below may reference images not displayed]

FINDINGS: Cardiomegaly. Lungs clear. No effusions. No acute bony abnormality.
IMPRESSION: Cardiomegaly.  No active disease.

## 2023-03-03 NOTE — Progress Notes (Deleted)
Office Visit    Patient Name: Joseph Tanner Date of Encounter: 03/03/2023  PCP:  Aviva Kluver   [facility]  Cardiologist:  [name], MD  Advanced Practice Provider:  No care team member to display Electrophysiologist:  None   HPI    Joseph Tanner is a 72 y.o. male with chronic systolic and diastolic heart failure, CKD 3, CAD status post NSTEMI with PCI, hypertension, hyperlipidemia, and alcohol abuse presents today for follow-up appointment.  The patient recently moved to [place] from [place].  He has a history of NSTEMI and chronic systolic and diastolic heart failure.  This occurred around 2019.  He initially did not have a heart catheterization.  Prior to leaving [place] he was hospitalized 3/22 with recurrent NSTEMI and heart failure symptoms.  Had a left heart catheterization that revealed severe stenosis of the small left circumflex and mild to moderate stenosis in the RCA which was large and heavily calcified.  His LVEF was 25% in [place].  He reports that it has been this level for many years and that he has been offered an ICD but declined.  He presented to [facility] 11/20/2020 with acute on chronic systolic and diastolic heart failure.  BNP was 4500.  High sensitive troponin was mildly elevated at 54 and relatively flat.  He was diuresed and switched from BiDil to [place].  He was discharged on 4/29.  His discharge weight is unclear.  On the day prior to discharge he was 69.3 kg.  On repeat labs his creatinine proved and Entresto was continued.  Since his last visit, he was seen in the hospital twice for COPD exacerbations.  High-sensitivity troponin peaked at 72 and a pattern consistent with demand ischemia.  He self discontinued Entresto, and then went to the ED a few days later.  He was given an inhaler which seemed to improve his cough but he remains short of breath.  Of note, around the same time as starting  Entresto he also moved into a new apartment with strong paint small.  He was seen 8/22 and was all out of medicine.  He was started back on carvedilol, Lasix, and [place].  Followed up with [name], NP 9/22 and weight was down 12 pounds but was still a pounds over his dry weight.  He followed up later that month and had not been taking his Lasix as prescribed.  He was started on spironolactone and referred to cardiac rehab.  He had a BNP 10/22 that was over 5000.  At his last visit he was doing well reported some atypical chest pain that seem to be musculoskeletal.  [name] was restarted.  Repeat echocardiogram 1/23 revealed LVEF 25 to 30%, mild LVH, grade 1 DD and ascending aortic dilatation of 41 mm.  Referral was recommended to EP for consideration of ICD.  Was last seen 08/29/2021 and was delayed in getting his medication refilled for about a week.  Currently, he was able  to take the majority of his prescriptions.  His daughter reported that he still does not have his potassium because he was unable to afford it.  He also does not have spironolactone.  Felt better on Entresto.  Reported occasionally forgets to eat prior to taking some of his medicines which causes indigestion and dizziness.  Occasionally was having pain along the right neck and diffuses down his right arm.  The lower extremity edema from his last visit was resolved.  He  reported that he had some clubbing and nail since he was a child.  Daughter hopes to bring him to the gym and have him exercise on a stationary bike.  He denied any palpitations, chest pain, shortness of breath, headaches, syncope, orthopnea, PND, lower extremity edema, or exertional symptoms.  He was seen by me 10/24/2022, he comes in with multiple complaints.  He is having some swelling in his lower legs as well as shortness of breath.  No symptoms today.  He said his breathing he does have occasionally shortness of breath gets worse during these times.   Occasionally has potation's as well.  If he is calm then he is okay  Majority of his life he has had issues with weight.  He cannot unwind his mind.  Every now and then he wakes up gasping for air but he usually always wakes up twice a night at least to use the bathroom.  He was referred to EP last year for an ICD insertion based on his echocardiogram results with an EF of 25 to 30%.  He was still thinking on whether he wanted an incision.  We discussed manage of his symptoms as well as options for sleeping aids which are available over-the-counter.  We also discussed some few tests to workup his symptoms such as echocardiogram, sleep study, ZIO 3-day monitor for PVC burden.   He will need some dental work done (3 extractions).  He does meet minimal METS and would be fine to go forward with this procedure.  If aspirin can be continued we would prefer this.  If the bleeding risk is too high may hold aspirin for 5 to 7 days prior to extraction been restarted when safe to do so.  Today, he ***  Past Medical History    Past Medical History:  Diagnosis Date   Hyperlipemia    Hypertension    NSTEMI (non-ST elevated myocardial infarction) (HCC)    Pure hypercholesterolemia 03/15/2021   No past surgical history on file.  Allergies  No Known Allergies  EKGs/Labs/Other Studies Reviewed:   The following studies were reviewed today:  Echocardiogram 08/21/2021 IMPRESSIONS     1. Left ventricular ejection fraction, by estimation, is 25 to 30%. The  left ventricle has severely decreased function. The left ventricle  demonstrates global hypokinesis. There is mild concentric left ventricular  hypertrophy. Left ventricular diastolic   parameters are consistent with Grade I diastolic dysfunction (impaired  relaxation). The average left ventricular global longitudinal strain is  -1.5 %. The global longitudinal strain is abnormal.   2. Right ventricular systolic function is normal. The right ventricular   size is normal.   3. The mitral valve is normal in structure. Trivial mitral valve  regurgitation. No evidence of mitral stenosis.   4. The aortic valve is normal in structure. Aortic valve regurgitation is  not visualized. No aortic stenosis is present.   5. Aneurysm of the ascending aorta, measuring 41 mm. There is borderline  dilatation of the aortic root, measuring 37 mm.   6. The inferior vena cava is normal in size with greater than 50%  respiratory variability, suggesting right atrial pressure of 3 mmHg.   Comparison(s): EF 20%.   FINDINGS   Left Ventricle: Left ventricular ejection fraction, by estimation, is 25  to 30%. The left ventricle has severely decreased function. The left  ventricle demonstrates global hypokinesis. The average left ventricular  global longitudinal strain is -1.5 %.  The global longitudinal strain is abnormal. The  left ventricular internal  cavity size was normal in size. There is mild concentric left ventricular  hypertrophy. Left ventricular diastolic parameters are consistent with  Grade I diastolic dysfunction  (impaired relaxation).   Right Ventricle: The right ventricular size is normal. No increase in  right ventricular wall thickness. Right ventricular systolic function is  normal.   Left Atrium: Left atrial size was normal in size.   Right Atrium: Right atrial size was normal in size.   Pericardium: There is no evidence of pericardial effusion.   Mitral Valve: The mitral valve is normal in structure. Trivial mitral  valve regurgitation. No evidence of mitral valve stenosis.   Tricuspid Valve: The tricuspid valve is normal in structure. Tricuspid  valve regurgitation is trivial. No evidence of tricuspid stenosis.   Aortic Valve: The aortic valve is normal in structure. Aortic valve  regurgitation is not visualized. No aortic stenosis is present. Aortic  valve mean gradient measures 2.0 mmHg. Aortic valve peak gradient measures  3.9  mmHg. Aortic valve area, by VTI  measures 2.87 cm.   Pulmonic Valve: The pulmonic valve was normal in structure. Pulmonic valve  regurgitation is not visualized. No evidence of pulmonic stenosis.   Aorta: The aortic root is normal in size and structure. There is  borderline dilatation of the aortic root, measuring 37 mm. There is an  aneurysm involving the ascending aorta measuring 41 mm.   Venous: The inferior vena cava is normal in size with greater than 50%  respiratory variability, suggesting right atrial pressure of 3 mmHg.   IAS/Shunts: No atrial level shunt detected by color flow Doppler.   EKG:  EKG is ordered today.  The ekg ordered today demonstrates normal sinus rhythm with occasional PAC, rate 96 bpm  Recent Labs: No results found for requested labs within last 365 days.  Recent Lipid Panel    Component Value Date/Time   CHOL 123 11/23/2020 0344   TRIG 138 11/23/2020 0344   HDL 39 (L) 11/23/2020 0344   CHOLHDL 3.2 11/23/2020 0344   VLDL 28 11/23/2020 0344   LDLCALC 56 11/23/2020 0344   Home Medications   No outpatient medications have been marked as taking for the 03/04/23 encounter (Appointment) with Sharlene Dory, PA-C.     Review of Systems      All other systems reviewed and are otherwise negative except as noted above.  Physical Exam    VS:  There were no vitals taken for this visit. , BMI There is no height or weight on file to calculate BMI.  Wt Readings from Last 3 Encounters:  10/24/22 155 lb 12.8 oz (70.7 kg)  08/29/21 165 lb 3.2 oz (74.9 kg)  05/24/21 155 lb 1.6 oz (70.4 kg)     GEN: Well nourished, well developed, in no acute distress. HEENT: normal. Neck: Supple, no JVD, carotid bruits, or masses. Cardiac: RRR frequent skipped beats, no murmurs, rubs, or gallops. No clubbing, cyanosis, edema.  Radials/PT 2+ and equal bilaterally.  Respiratory:  Respirations regular and unlabored, clear to auscultation bilaterally. GI: Soft, nontender,  nondistended. MS: No deformity or atrophy. Skin: Warm and dry, no rash. Neuro:  Strength and sensation are intact. Psych: Normal affect.  Assessment & Plan    Chronic combined systolic and diastolic heart failure -euvolemic on exam today -continue current medications including aspirin 81 mg, Lipitor 40 mg, Coreg 3.125 mg twice daily, Zetia 10 mg, Lasix 40 mg, Entresto 24-26 mg (taking a half a tab and we increased  to 1 tab), spironolactone 12.5 mg daily. - workup with echocardiogram and referral to EP for ICD - plan for 3-day monitor to workup palpitations -plan for sleep study tomorrow evaluation showed OSA with STOP-BANG of 5  Coronary artery disease -Continue current medication regimen  CKD -Recent creatinine 1.52    Disposition: Follow up 6 weeks with [name], MD or APP.  Signed, Sharlene Dory, PA-C 03/03/2023, 9:42 PM  [facility]

## 2023-03-04 ENCOUNTER — Ambulatory Visit: Payer: Medicare (Managed Care) | Attending: Family | Admitting: Physician Assistant

## 2023-03-04 DIAGNOSIS — I10 Essential (primary) hypertension: Secondary | ICD-10-CM

## 2023-03-04 DIAGNOSIS — N1832 Chronic kidney disease, stage 3b: Secondary | ICD-10-CM

## 2023-03-04 DIAGNOSIS — I5042 Chronic combined systolic (congestive) and diastolic (congestive) heart failure: Secondary | ICD-10-CM

## 2023-03-04 DIAGNOSIS — I2510 Atherosclerotic heart disease of native coronary artery without angina pectoris: Secondary | ICD-10-CM

## 2023-05-31 ENCOUNTER — Other Ambulatory Visit (HOSPITAL_BASED_OUTPATIENT_CLINIC_OR_DEPARTMENT_OTHER): Payer: Self-pay | Admitting: Cardiovascular Disease

## 2023-06-14 ENCOUNTER — Emergency Department (HOSPITAL_COMMUNITY): Payer: Medicare HMO

## 2023-06-14 ENCOUNTER — Encounter (HOSPITAL_COMMUNITY): Payer: Self-pay

## 2023-06-14 ENCOUNTER — Inpatient Hospital Stay (HOSPITAL_COMMUNITY)
Admission: EM | Admit: 2023-06-14 | Discharge: 2023-06-19 | DRG: 682 | Disposition: A | Discharge status: Home / Self Care | Payer: Medicare HMO | Attending: Family Medicine | Admitting: Family Medicine

## 2023-06-14 DIAGNOSIS — I5021 Acute systolic (congestive) heart failure: Secondary | ICD-10-CM | POA: Diagnosis not present

## 2023-06-14 DIAGNOSIS — N390 Urinary tract infection, site not specified: Secondary | ICD-10-CM | POA: Diagnosis present

## 2023-06-14 DIAGNOSIS — R531 Weakness: Principal | ICD-10-CM

## 2023-06-14 DIAGNOSIS — E7800 Pure hypercholesterolemia, unspecified: Secondary | ICD-10-CM | POA: Diagnosis present

## 2023-06-14 DIAGNOSIS — R0602 Shortness of breath: Secondary | ICD-10-CM

## 2023-06-14 DIAGNOSIS — E43 Unspecified severe protein-calorie malnutrition: Secondary | ICD-10-CM | POA: Diagnosis present

## 2023-06-14 DIAGNOSIS — D696 Thrombocytopenia, unspecified: Secondary | ICD-10-CM | POA: Diagnosis present

## 2023-06-14 DIAGNOSIS — K529 Noninfective gastroenteritis and colitis, unspecified: Secondary | ICD-10-CM | POA: Diagnosis present

## 2023-06-14 DIAGNOSIS — Z1152 Encounter for screening for COVID-19: Secondary | ICD-10-CM

## 2023-06-14 DIAGNOSIS — I130 Hypertensive heart and chronic kidney disease with heart failure and stage 1 through stage 4 chronic kidney disease, or unspecified chronic kidney disease: Secondary | ICD-10-CM | POA: Diagnosis present

## 2023-06-14 DIAGNOSIS — F17200 Nicotine dependence, unspecified, uncomplicated: Secondary | ICD-10-CM | POA: Diagnosis present

## 2023-06-14 DIAGNOSIS — Z7982 Long term (current) use of aspirin: Secondary | ICD-10-CM

## 2023-06-14 DIAGNOSIS — I2510 Atherosclerotic heart disease of native coronary artery without angina pectoris: Secondary | ICD-10-CM | POA: Diagnosis present

## 2023-06-14 DIAGNOSIS — E860 Dehydration: Secondary | ICD-10-CM | POA: Diagnosis present

## 2023-06-14 DIAGNOSIS — D649 Anemia, unspecified: Secondary | ICD-10-CM | POA: Diagnosis present

## 2023-06-14 DIAGNOSIS — F1410 Cocaine abuse, uncomplicated: Secondary | ICD-10-CM | POA: Diagnosis present

## 2023-06-14 DIAGNOSIS — N179 Acute kidney failure, unspecified: Principal | ICD-10-CM | POA: Diagnosis present

## 2023-06-14 DIAGNOSIS — I959 Hypotension, unspecified: Secondary | ICD-10-CM

## 2023-06-14 DIAGNOSIS — R5381 Other malaise: Secondary | ICD-10-CM | POA: Diagnosis present

## 2023-06-14 DIAGNOSIS — Z23 Encounter for immunization: Secondary | ICD-10-CM | POA: Diagnosis present

## 2023-06-14 DIAGNOSIS — I5023 Acute on chronic systolic (congestive) heart failure: Secondary | ICD-10-CM | POA: Diagnosis present

## 2023-06-14 DIAGNOSIS — Z681 Body mass index (BMI) 19 or less, adult: Secondary | ICD-10-CM | POA: Diagnosis not present

## 2023-06-14 DIAGNOSIS — N1832 Chronic kidney disease, stage 3b: Secondary | ICD-10-CM | POA: Diagnosis not present

## 2023-06-14 DIAGNOSIS — I5043 Acute on chronic combined systolic (congestive) and diastolic (congestive) heart failure: Secondary | ICD-10-CM

## 2023-06-14 DIAGNOSIS — N189 Chronic kidney disease, unspecified: Secondary | ICD-10-CM | POA: Diagnosis not present

## 2023-06-14 DIAGNOSIS — I429 Cardiomyopathy, unspecified: Secondary | ICD-10-CM | POA: Diagnosis present

## 2023-06-14 DIAGNOSIS — I5042 Chronic combined systolic (congestive) and diastolic (congestive) heart failure: Secondary | ICD-10-CM | POA: Diagnosis present

## 2023-06-14 DIAGNOSIS — Z9861 Coronary angioplasty status: Secondary | ICD-10-CM

## 2023-06-14 DIAGNOSIS — J449 Chronic obstructive pulmonary disease, unspecified: Secondary | ICD-10-CM | POA: Diagnosis present

## 2023-06-14 DIAGNOSIS — N1831 Chronic kidney disease, stage 3a: Secondary | ICD-10-CM | POA: Diagnosis present

## 2023-06-14 DIAGNOSIS — R627 Adult failure to thrive: Secondary | ICD-10-CM | POA: Diagnosis present

## 2023-06-14 DIAGNOSIS — R5383 Other fatigue: Secondary | ICD-10-CM

## 2023-06-14 DIAGNOSIS — Z79899 Other long term (current) drug therapy: Secondary | ICD-10-CM

## 2023-06-14 DIAGNOSIS — N183 Chronic kidney disease, stage 3 unspecified: Secondary | ICD-10-CM | POA: Diagnosis present

## 2023-06-14 DIAGNOSIS — Z5982 Transportation insecurity: Secondary | ICD-10-CM

## 2023-06-14 DIAGNOSIS — Z8249 Family history of ischemic heart disease and other diseases of the circulatory system: Secondary | ICD-10-CM

## 2023-06-14 DIAGNOSIS — I252 Old myocardial infarction: Secondary | ICD-10-CM | POA: Diagnosis not present

## 2023-06-14 DIAGNOSIS — N184 Chronic kidney disease, stage 4 (severe): Secondary | ICD-10-CM | POA: Diagnosis present

## 2023-06-14 DIAGNOSIS — Z95810 Presence of automatic (implantable) cardiac defibrillator: Secondary | ICD-10-CM

## 2023-06-14 HISTORY — DX: Chronic obstructive pulmonary disease, unspecified: J44.9

## 2023-06-14 LAB — URINALYSIS, W/ REFLEX TO CULTURE (INFECTION SUSPECTED)
Bacteria, UA: NONE SEEN
Bilirubin Urine: NEGATIVE
Glucose, UA: NEGATIVE mg/dL
Hgb urine dipstick: NEGATIVE
Ketones, ur: NEGATIVE mg/dL
Nitrite: NEGATIVE
Protein, ur: 30 mg/dL — AB
Specific Gravity, Urine: 1.012 (ref 1.005–1.030)
WBC, UA: >50 WBC/hpf (ref 0–5)
pH: 7 (ref 5.0–8.0)

## 2023-06-14 LAB — COMPREHENSIVE METABOLIC PANEL
ALT: 14 U/L (ref 0–44)
AST: 43 U/L — ABNORMAL HIGH (ref 15–41)
Albumin: 2.8 g/dL — ABNORMAL LOW (ref 3.5–5.0)
Alkaline Phosphatase: 47 U/L (ref 38–126)
Anion gap: 13 (ref 5–15)
BUN: 28 mg/dL — ABNORMAL HIGH (ref 8–23)
CO2: 28 mmol/L (ref 22–32)
Calcium: 7.9 mg/dL — ABNORMAL LOW (ref 8.9–10.3)
Chloride: 95 mmol/L — ABNORMAL LOW (ref 98–111)
Creatinine, Ser: 3.11 mg/dL — ABNORMAL HIGH (ref 0.61–1.24)
GFR, Estimated: 20 mL/min — ABNORMAL LOW (ref >60)
Glucose, Bld: 112 mg/dL — ABNORMAL HIGH (ref 70–99)
Potassium: 3.7 mmol/L (ref 3.5–5.1)
Sodium: 136 mmol/L (ref 135–145)
Total Bilirubin: 2.8 mg/dL — ABNORMAL HIGH (ref <1.2)
Total Protein: 7 g/dL (ref 6.5–8.1)

## 2023-06-14 LAB — RESP PANEL BY RT-PCR (RSV, FLU A&B, COVID)  RVPGX2
Influenza A by PCR: NEGATIVE
Influenza B by PCR: NEGATIVE
Resp Syncytial Virus by PCR: NEGATIVE
SARS Coronavirus 2 by RT PCR: NEGATIVE

## 2023-06-14 LAB — CBC WITH DIFFERENTIAL/PLATELET
Abs Immature Granulocytes: 0 10*3/uL (ref 0.00–0.07)
Basophils Absolute: 0 10*3/uL (ref 0.0–0.1)
Basophils Relative: 1 %
Eosinophils Absolute: 0 10*3/uL (ref 0.0–0.5)
Eosinophils Relative: 1 %
HCT: 35.9 % — ABNORMAL LOW (ref 39.0–52.0)
Hemoglobin: 11.1 g/dL — ABNORMAL LOW (ref 13.0–17.0)
Immature Granulocytes: 0 %
Lymphocytes Relative: 22 %
Lymphs Abs: 1.1 10*3/uL (ref 0.7–4.0)
MCH: 25.3 pg — ABNORMAL LOW (ref 26.0–34.0)
MCHC: 30.9 g/dL (ref 30.0–36.0)
MCV: 82 fL (ref 80.0–100.0)
Monocytes Absolute: 0.7 10*3/uL (ref 0.1–1.0)
Monocytes Relative: 15 %
Neutro Abs: 3 10*3/uL (ref 1.7–7.7)
Neutrophils Relative %: 61 %
Platelets: 119 10*3/uL — ABNORMAL LOW (ref 150–400)
RBC: 4.38 MIL/uL (ref 4.22–5.81)
RDW: 22.5 % — ABNORMAL HIGH (ref 11.5–15.5)
WBC: 4.8 10*3/uL (ref 4.0–10.5)
nRBC: 0 % (ref 0.0–0.2)

## 2023-06-14 LAB — TYPE AND SCREEN
ABO/RH(D): O POS
Antibody Screen: NEGATIVE

## 2023-06-14 LAB — TROPONIN I (HIGH SENSITIVITY): Troponin I (High Sensitivity): 60 ng/L — ABNORMAL HIGH (ref <18)

## 2023-06-14 LAB — I-STAT CG4 LACTIC ACID, ED: Lactic Acid, Venous: 1.8 mmol/L (ref 0.5–1.9)

## 2023-06-14 MED ORDER — ACETAMINOPHEN 650 MG RE SUPP: 650.0000 mg | Freq: Four times a day (QID) | RECTAL | Status: DC | PRN

## 2023-06-14 MED ORDER — LACTATED RINGERS IV BOLUS
1000.0000 mL | Freq: Once | INTRAVENOUS | Status: AC
Start: 2023-06-14 — End: 2023-06-14
  Administered 2023-06-14: 1000 mL via INTRAVENOUS

## 2023-06-14 MED ORDER — LACTATED RINGERS IV BOLUS
500.0000 mL | Freq: Once | INTRAVENOUS | Status: AC
  Administered 2023-06-14: 500 mL via INTRAVENOUS

## 2023-06-14 MED ORDER — ONDANSETRON HCL 4 MG/2ML IJ SOLN: 4.0000 mg | Freq: Four times a day (QID) | INTRAMUSCULAR | Status: DC | PRN

## 2023-06-14 MED ORDER — ONDANSETRON HCL 4 MG PO TABS: 4.0000 mg | ORAL_TABLET | Freq: Four times a day (QID) | ORAL | Status: DC | PRN

## 2023-06-14 MED ORDER — SODIUM CHLORIDE 0.9 % IV SOLN
1.0000 g | Freq: Once | INTRAVENOUS | Status: AC
  Administered 2023-06-14: 1 g via INTRAVENOUS
  Filled 2023-06-14: qty 10

## 2023-06-14 MED ORDER — ADULT MULTIVITAMIN W/MINERALS CH
1.0000 | ORAL_TABLET | Freq: Every day | ORAL | Status: DC
  Administered 2023-06-15 – 2023-06-19 (×5): 1 via ORAL
  Filled 2023-06-14 (×5): qty 1

## 2023-06-14 MED ORDER — THIAMINE MONONITRATE 100 MG PO TABS
100.0000 mg | ORAL_TABLET | Freq: Every day | ORAL | Status: DC
  Administered 2023-06-15 – 2023-06-19 (×5): 100 mg via ORAL
  Filled 2023-06-14 (×5): qty 1

## 2023-06-14 MED ORDER — TRAZODONE HCL 50 MG PO TABS
50.0000 mg | ORAL_TABLET | Freq: Every day | ORAL | Status: DC
  Administered 2023-06-15 – 2023-06-18 (×5): 50 mg via ORAL
  Filled 2023-06-14 (×5): qty 1

## 2023-06-14 MED ORDER — FOLIC ACID 1 MG PO TABS
1.0000 mg | ORAL_TABLET | Freq: Every day | ORAL | Status: DC
  Administered 2023-06-15 – 2023-06-19 (×5): 1 mg via ORAL
  Filled 2023-06-14 (×5): qty 1

## 2023-06-14 MED ORDER — ENOXAPARIN SODIUM 30 MG/0.3ML IJ SOSY
30.0000 mg | PREFILLED_SYRINGE | INTRAMUSCULAR | Status: DC
Start: 2023-06-14 — End: 2023-06-19
  Administered 2023-06-15 – 2023-06-18 (×5): 30 mg via SUBCUTANEOUS
  Filled 2023-06-14 (×5): qty 0.3

## 2023-06-14 MED ORDER — IPRATROPIUM-ALBUTEROL 0.5-2.5 (3) MG/3ML IN SOLN: 3.0000 mL | RESPIRATORY_TRACT | Status: DC | PRN

## 2023-06-14 MED ORDER — ACETAMINOPHEN 500 MG PO TABS: 500.0000 mg | ORAL_TABLET | Freq: Four times a day (QID) | ORAL | Status: DC | PRN

## 2023-06-14 NOTE — ED Provider Notes (Signed)
EMERGENCY DEPARTMENT AT [facility] Provider Note   CSN: [id number] Arrival date & time: 06/14/23  1814     History  Chief Complaint  Patient presents with   Shortness of Breath   Fatigue    Joseph Tanner is a 72 y.o. male.  Pt with generalized weakness  and sob in the past month. Denies acute change or worsening today. Indicates out of entresto in past few weeks, but has been taking other meds. Denies chest pain or discomfort. No orthopnea or pnd. No new or worsening cough. No sore throat. No fever or chills. No abd pain. Had recent episode of diarrhea and emesis - indicates not bloody or bilious. Denies rectal bleeding or melena. No dysuria.  Indicates bilateral feet seemed swollen last week, but a bit better now.   The history is provided by the patient, medical records and a relative.  Shortness of Breath Associated symptoms: no abdominal pain, no chest pain, no fever, no headaches, no neck pain, no rash and no sore throat        Home Medications Prior to Admission medications   Medication Sig Start Date End Date Taking? Authorizing Provider  albuterol (VENTOLIN HFA) 108 (90 Base) MCG/ACT inhaler Inhale 2 puffs into the lungs every 6 (six) hours as needed for wheezing or shortness of breath.    [provider]  aspirin EC 81 MG EC tablet Take 1 tablet (81 mg total) by mouth daily. Swallow whole. 11/25/20   Standley Brooking, MD  atorvastatin (LIPITOR) 40 MG tablet Take 1 tablet (40 mg total) by mouth every evening. 01/29/23   Sharlene Dory, PA-C  carvedilol (COREG) 3.125 MG tablet Take 1 tablet (3.125 mg total) by mouth 2 (two) times daily with a meal. 01/31/23   Sharlene Dory, PA-C  dapagliflozin propanediol (FARXIGA) 10 MG TABS tablet Take 1 tablet (10 mg total) by mouth daily before breakfast. 01/29/23   Sharlene Dory, PA-C  furosemide (LASIX) 40 MG tablet Take 1 tablet (40 mg total) by mouth daily. 01/29/23   Sharlene Dory, PA-C   gabapentin (NEURONTIN) 100 MG capsule Take 100 mg by mouth daily as needed (pain).    [provider]  Multiple Vitamin (MULTIVITAMIN WITH MINERALS) TABS tablet Take 1 tablet by mouth daily. 11/25/20   Standley Brooking, MD  sacubitril-valsartan [facility]) 24-26 MG Take 1 tablet by mouth twice daily 01/29/23   Sharlene Dory, PA-C  spironolactone (ALDACTONE) 25 MG tablet Take 0.5 tablets (12.5 mg total) by mouth daily. 02/03/23   Sharlene Dory, PA-C  Tiotropium Bromide Monohydrate (SPIRIVA RESPIMAT) 1.25 MCG/ACT AERS Inhale 2 puffs into the lungs daily. Patient taking differently: Inhale 2 puffs into the lungs as needed. 02/21/21   Coralyn Mark, NP      Allergies    Patient has no known allergies.    Review of Systems   Review of Systems  Constitutional:  Negative for chills and fever.  HENT:  Negative for sore throat.   Eyes:  Negative for visual disturbance.  Respiratory:  Positive for shortness of breath.   Cardiovascular:  Negative for chest pain and palpitations.  Gastrointestinal:  Negative for abdominal pain and blood in stool.  Genitourinary:  Negative for dysuria and flank pain.  Musculoskeletal:  Negative for back pain and neck pain.  Skin:  Negative for rash.  Neurological:  Negative for headaches.  Hematological:  Does not bruise/bleed easily.  Psychiatric/Behavioral:  Negative for confusion.  Physical Exam Updated Vital Signs BP 92/65   Pulse 81   Temp 97.7 F (36.5 C) (Oral)   Resp (!) 22   Ht 1.905 m (6\' 3" )   Wt 77.1 kg   SpO2 98%   BMI 21.25 kg/m  Physical Exam Vitals and nursing note reviewed.  Constitutional:      Appearance: He is well-developed.     Comments: Thin/frail appearing. Bp low.   HENT:     Head: Atraumatic.     Nose: Nose normal.     Mouth/Throat:     Mouth: Mucous membranes are moist.     Pharynx: Oropharynx is clear.  Eyes:     General: No scleral icterus.    Pupils: Pupils are equal, round, and reactive to light.   Neck:     Vascular: No carotid bruit.     Trachea: No tracheal deviation.     Comments: No stiffness or rigidity.  Cardiovascular:     Rate and Rhythm: Normal rate and regular rhythm.     Pulses: Normal pulses.     Heart sounds: Normal heart sounds. No murmur heard.    No friction rub. No gallop.  Pulmonary:     Effort: Pulmonary effort is normal. No accessory muscle usage or respiratory distress.     Breath sounds: Normal breath sounds.  Abdominal:     General: There is no distension.     Palpations: Abdomen is soft. There is no mass.     Tenderness: There is no abdominal tenderness. There is no guarding.  Genitourinary:    Comments: No cva tenderness. Musculoskeletal:        General: No swelling or tenderness.     Cervical back: Normal range of motion and neck supple. No rigidity or tenderness.     Right lower leg: No edema.     Left lower leg: No edema.  Skin:    General: Skin is warm and dry.     Findings: No rash.  Neurological:     Mental Status: He is alert.     Comments: Alert, speech clear. Motor/sens grossly intact bil.   Psychiatric:        Mood and Affect: Mood normal.     ED Results / Procedures / Treatments   Labs (all labs ordered are listed, but only abnormal results are displayed) Results for orders placed or performed during the hospital encounter of 06/14/23  Comprehensive metabolic panel  Result Value Ref Range   Sodium 136 135 - 145 mmol/L   Potassium 3.7 3.5 - 5.1 mmol/L   Chloride 95 (L) 98 - 111 mmol/L   CO2 28 22 - 32 mmol/L   Glucose, Bld 112 (H) 70 - 99 mg/dL   BUN 28 (H) 8 - 23 mg/dL   Creatinine, Ser 9.60 (H) 0.61 - 1.24 mg/dL   Calcium 7.9 (L) 8.9 - 10.3 mg/dL   Total Protein 7.0 6.5 - 8.1 g/dL   Albumin 2.8 (L) 3.5 - 5.0 g/dL   AST 43 (H) 15 - 41 U/L   ALT 14 0 - 44 U/L   Alkaline Phosphatase 47 38 - 126 U/L   Total Bilirubin 2.8 (H) <1.2 mg/dL   GFR, Estimated 20 (L) >60 mL/min   Anion gap 13 5 - 15  CBC with Differential   Result Value Ref Range   WBC 4.8 4.0 - 10.5 K/uL   RBC 4.38 4.22 - 5.81 MIL/uL   Hemoglobin 11.1 (L) 13.0 - 17.0 g/dL  HCT 35.9 (L) 39.0 - 52.0 %   MCV 82.0 80.0 - 100.0 fL   MCH 25.3 (L) 26.0 - 34.0 pg   MCHC 30.9 30.0 - 36.0 g/dL   RDW 14.7 (H) 82.9 - 56.2 %   Platelets 119 (L) 150 - 400 K/uL   nRBC 0.0 0.0 - 0.2 %   Neutrophils Relative % 61 %   Lymphocytes Relative 22 %   Lymphs Abs 1.1 0.7 - 4.0 K/uL   Monocytes Relative 15 %   Monocytes Absolute 0.7 0.1 - 1.0 K/uL   Eosinophils Relative 1 %   Eosinophils Absolute 0.0 0.0 - 0.5 K/uL   Basophils Relative 1 %   Basophils Absolute 0.0 0.0 - 0.1 K/uL   Immature Granulocytes 0 %   Neutro Abs 3.0 1.7 - 7.7 K/uL   Abs Immature Granulocytes 0.00 0.00 - 0.07 K/uL   Target Cells TARGET CELLS   Urinalysis, w/ Reflex to Culture (Infection Suspected) -Urine, Clean Catch  Result Value Ref Range   Specimen Source URINE, CLEAN CATCH    Color, Urine AMBER (A) YELLOW   APPearance CLOUDY (A) CLEAR   Specific Gravity, Urine 1.012 1.005 - 1.030   pH 7.0 5.0 - 8.0   Glucose, UA NEGATIVE NEGATIVE mg/dL   Hgb urine dipstick NEGATIVE NEGATIVE   Bilirubin Urine NEGATIVE NEGATIVE   Ketones, ur NEGATIVE NEGATIVE mg/dL   Protein, ur 30 (A) NEGATIVE mg/dL   Nitrite NEGATIVE NEGATIVE   Leukocytes,Ua LARGE (A) NEGATIVE   RBC / HPF 11-20 0 - 5 RBC/hpf   WBC, UA >50 0 - 5 WBC/hpf   Bacteria, UA NONE SEEN NONE SEEN   Squamous Epithelial / HPF 21-50 0 - 5 /HPF   WBC Clumps PRESENT    Mucus PRESENT    Budding Yeast PRESENT    Hyaline Casts, UA PRESENT    Non Squamous Epithelial 0-5 (A) NONE SEEN  I-Stat Lactic Acid, ED  Result Value Ref Range   Lactic Acid, Venous 1.8 0.5 - 1.9 mmol/L  Troponin I (High Sensitivity)  Result Value Ref Range   Troponin I (High Sensitivity) 60 (H) <18 ng/L    EKG EKG Interpretation Date/Time:  Saturday June 14 2023 18:30:48 EST Ventricular Rate:  86 PR Interval:  190 QRS Duration:  112 QT  Interval:  435 QTC Calculation: 521 R Axis:   218  Text Interpretation: Sinus rhythm Non-specific ST-t changes Sinus rhythm Prolonged QT interval Confirmed by Cathren Laine ([id number]) on 06/14/2023 7:09:18 PM  Radiology DG Chest Port 1 View  Result Date: 06/14/2023 CLINICAL DATA:  One-month history of fatigue and shortness of breath EXAM: PORTABLE CHEST 1 VIEW COMPARISON:  Chest radiograph dated 02/21/2021 FINDINGS: Normal lung volumes. Minimal bilateral lower lung interstitial opacities. No pleural effusion or pneumothorax. Similar enlarged cardiomediastinal silhouette. No acute osseous abnormality. IMPRESSION: 1. Minimal bilateral lower lung interstitial opacities, which may represent atelectasis, edema, or atypical infection. 2. Similar cardiomegaly. Electronically Signed   By: Agustin Cree M.D.   On: 06/14/2023 19:52    Procedures Procedures    Medications Ordered in ED Medications  lactated ringers bolus 1,000 mL (has no administration in time range)  lactated ringers bolus 500 mL (500 mLs Intravenous New Bag/Given 06/14/23 1943)  lactated ringers bolus 1,000 mL (1,000 mLs Intravenous New Bag/Given 06/14/23 2023)    ED Course/ Medical Decision Making/ A&P  Medical Decision Making Problems Addressed: Acute UTI: acute illness or injury with systemic symptoms that poses a threat to life or bodily functions AKI (acute kidney injury) [facility]): acute illness or injury with systemic symptoms that poses a threat to life or bodily functions Fatigue, unspecified type: acute illness or injury Generalized weakness: acute illness or injury with systemic symptoms that poses a threat to life or bodily functions Hypotension, unspecified hypotension type: acute illness or injury with systemic symptoms that poses a threat to life or bodily functions Shortness of breath: acute illness or injury with systemic symptoms that poses a threat to life or bodily  functions Thrombocytopenia (HCC): acute illness or injury  Amount and/or Complexity of Data Reviewed Independent Historian:     Details: Family, hx External Data Reviewed: labs and notes. Labs: ordered. Decision-making details documented in ED Course. Radiology: ordered and independent interpretation performed. Decision-making details documented in ED Course. ECG/medicine tests: ordered and independent interpretation performed. Decision-making details documented in ED Course. Discussion of management or test interpretation with external provider(s): medicine  Risk Prescription drug management. Decision regarding hospitalization.  Iv ns. Continuous pulse ox and cardiac monitoring. Labs ordered/sent. Imaging ordered.   Differential diagnosis includes infection/sepsis, anemia, chf, nstemi, etc. Dispo decision including potential need for admission considered - will get labs and imaging and reassess.   Reviewed nursing notes and prior charts for additional history. External reports reviewed. Additional history from: family.  BP low. LR bolus.   Cardiac monitor: sinus rhythm, rate 80.  Labs reviewed/interpreted by me - wbc normal. Hct 36. Lactate normal. K normal. Ua pending. Cr elevated c/w aki. Ivf boluses.  Trop mildly high, hx same on prior labs, denies chest pain or discomfort.  UA w > 50 wbc, ?uti, rocephin iv. Ivf.   Xrays reviewed/interpreted by me - ?vascular congestion.  Given weakness, hypotension, aki, will admit. Hospitalists consulted for admission.   Bp improved from prior.  CRITICAL CARE RE: hypotension, aki.  Performed by: Suzi Roots Total critical care time: 45 minutes Critical care time was exclusive of separately billable procedures and treating other patients. Critical care was necessary to treat or prevent imminent or life-threatening deterioration. Critical care was time spent personally by me on the following activities: development of treatment plan with  patient and/or surrogate as well as nursing, discussions with consultants, evaluation of patient's response to treatment, examination of patient, obtaining history from patient or surrogate, ordering and performing treatments and interventions, ordering and review of laboratory studies, ordering and review of radiographic studies, pulse oximetry and re-evaluation of patient's condition.            Final Clinical Impression(s) / ED Diagnoses Final diagnoses:  Generalized weakness  Fatigue, unspecified type  Shortness of breath  AKI (acute kidney injury) (HCC)  Thrombocytopenia (HCC)  Hypotension, unspecified hypotension type    Rx / DC Orders ED Discharge Orders     None         Cathren Laine, MD 06/14/23 2101

## 2023-06-14 NOTE — ED Notes (Signed)
Called out pt. Decreased blood pressure 79/60, RN made aware.

## 2023-06-14 NOTE — ED Notes (Signed)
ED TO INPATIENT HANDOFF REPORT  ED Nurse Name and Phone #: Melburn Hake Name/Age/Gender Joseph Tanner 72 y.o. male Room/Bed: WA06/WA06  Code Status   Code Status: Full Code  Home/SNF/Other Home Patient oriented to: situation Is this baseline? Yes   Triage Complete: Triage complete  Chief Complaint Acute kidney injury [facility]) [N17.9]  Triage Note Pt c/o SOB and fatigue x1 month.  Denies pain.  Pt reports bilateral feet were swollen, but they went down today.    Hx of CHF and COPD.    Allergies No Known Allergies  Level of Care/Admitting Diagnosis ED Disposition     ED Disposition  Admit   Condition  --   Comment  Hospital Area: [facility] [[id number]]  Level of Care: Telemetry [5]  Admit to tele based on following criteria: Acute CHF  May admit patient to [facility] or [facility] if equivalent level of care is available:: Yes  Covid Evaluation: Asymptomatic - no recent exposure (last 10 days) testing not required  Diagnosis: Acute kidney injury [facility]) [[id number]]  Admitting Physician: Buena Irish [[id number]]  Attending Physician: Buena Irish [phone]  Certification:: I certify this patient will need inpatient services for at least 2 midnights  Expected Medical Readiness: 06/16/2023          B Medical/Surgery History Past Medical History:  Diagnosis Date   COPD (chronic obstructive pulmonary disease) (HCC)    Hyperlipemia    Hypertension    NSTEMI (non-ST elevated myocardial infarction) (HCC)    Pure hypercholesterolemia 03/15/2021   History reviewed. No pertinent surgical history.   A IV Location/Drains/Wounds Patient Lines/Drains/Airways Status     Active Line/Drains/Airways     Name Placement date Placement time Site Days   Peripheral IV 06/14/23 20 G Right Arm 06/14/23  1942  Arm  less than 1            Intake/Output Last 24 hours  Intake/Output Summary (Last 24 hours) at 06/14/2023 2219 Last data filed at  06/14/2023 2205 Gross per 24 hour  Intake 1600 ml  Output --  Net 1600 ml    Labs/Imaging Results for orders placed or performed during the hospital encounter of 06/14/23 (from the past 48 hour(s))  Comprehensive metabolic panel     Status: Abnormal   Collection Time: 06/14/23  7:12 PM  Result Value Ref Range   Sodium 136 135 - 145 mmol/L   Potassium 3.7 3.5 - 5.1 mmol/L   Chloride 95 (L) 98 - 111 mmol/L   CO2 28 22 - 32 mmol/L   Glucose, Bld 112 (H) 70 - 99 mg/dL    Comment: Glucose reference range applies only to samples taken after fasting for at least 8 hours.   BUN 28 (H) 8 - 23 mg/dL   Creatinine, Ser 7.42 (H) 0.61 - 1.24 mg/dL   Calcium 7.9 (L) 8.9 - 10.3 mg/dL   Total Protein 7.0 6.5 - 8.1 g/dL   Albumin 2.8 (L) 3.5 - 5.0 g/dL   AST 43 (H) 15 - 41 U/L   ALT 14 0 - 44 U/L   Alkaline Phosphatase 47 38 - 126 U/L   Total Bilirubin 2.8 (H) <1.2 mg/dL   GFR, Estimated 20 (L) >60 mL/min    Comment: (NOTE) Calculated using the CKD-EPI Creatinine Equation (2021)    Anion gap 13 5 - 15    Comment: Performed at [facility], [address], [place], [place] [address]  CBC with Differential  Status: Abnormal   Collection Time: 06/14/23  7:12 PM  Result Value Ref Range   WBC 4.8 4.0 - 10.5 K/uL   RBC 4.38 4.22 - 5.81 MIL/uL   Hemoglobin 11.1 (L) 13.0 - 17.0 g/dL   HCT 40.9 (L) 81.1 - 91.4 %   MCV 82.0 80.0 - 100.0 fL   MCH 25.3 (L) 26.0 - 34.0 pg   MCHC 30.9 30.0 - 36.0 g/dL   RDW 78.2 (H) 95.6 - 21.3 %   Platelets 119 (L) 150 - 400 K/uL    Comment: SPECIMEN CHECKED FOR CLOTS REPEATED TO VERIFY    nRBC 0.0 0.0 - 0.2 %   Neutrophils Relative % 61 %   Lymphocytes Relative 22 %   Lymphs Abs 1.1 0.7 - 4.0 K/uL   Monocytes Relative 15 %   Monocytes Absolute 0.7 0.1 - 1.0 K/uL   Eosinophils Relative 1 %   Eosinophils Absolute 0.0 0.0 - 0.5 K/uL   Basophils Relative 1 %   Basophils Absolute 0.0 0.0 - 0.1 K/uL   Immature Granulocytes 0 %   Neutro  Abs 3.0 1.7 - 7.7 K/uL    Comment: CORRECTED ON 11/16 AT 2057: PREVIOUSLY REPORTED AS 2.9   Abs Immature Granulocytes 0.00 0.00 - 0.07 K/uL    Comment: CORRECTED ON 11/16 AT 2057: PREVIOUSLY REPORTED AS 0.01   Target Cells TARGET CELLS     Comment: Performed at [facility], [address], [place], [place] [address]  Troponin I (High Sensitivity)     Status: Abnormal   Collection Time: 06/14/23  7:12 PM  Result Value Ref Range   Troponin I (High Sensitivity) 60 (H) <18 ng/L    Comment: (NOTE) Elevated high sensitivity troponin I (hsTnI) values and significant  changes across serial measurements may suggest ACS but many other  chronic and acute conditions are known to elevate hsTnI results.  Refer to the "Links" section for chest pain algorithms and additional  guidance. Performed at [facility], [address], [place], [place] [address]   Urinalysis, w/ Reflex to Culture (Infection Suspected) -Urine, Clean Catch     Status: Abnormal   Collection Time: 06/14/23  7:52 PM  Result Value Ref Range   Specimen Source URINE, CLEAN CATCH    Color, Urine AMBER (A) YELLOW    Comment: BIOCHEMICALS MAY BE AFFECTED BY COLOR   APPearance CLOUDY (A) CLEAR   Specific Gravity, Urine 1.012 1.005 - 1.030   pH 7.0 5.0 - 8.0   Glucose, UA NEGATIVE NEGATIVE mg/dL   Hgb urine dipstick NEGATIVE NEGATIVE   Bilirubin Urine NEGATIVE NEGATIVE   Ketones, ur NEGATIVE NEGATIVE mg/dL   Protein, ur 30 (A) NEGATIVE mg/dL   Nitrite NEGATIVE NEGATIVE   Leukocytes,Ua LARGE (A) NEGATIVE   RBC / HPF 11-20 0 - 5 RBC/hpf   WBC, UA >50 0 - 5 WBC/hpf    Comment:        Reflex urine culture not performed if WBC <=10, OR if Squamous epithelial cells >5. If Squamous epithelial cells >5 suggest recollection.    Bacteria, UA NONE SEEN NONE SEEN   Squamous Epithelial / HPF 21-50 0 - 5 /HPF   WBC Clumps PRESENT    Mucus PRESENT    Budding Yeast PRESENT    Hyaline Casts, UA  PRESENT    Non Squamous Epithelial 0-5 (A) NONE SEEN    Comment: Performed at [facility], [address], [place], [place] [address]  I-Stat  Lactic Acid, ED     Status: None   Collection Time: 06/14/23  7:55 PM  Result Value Ref Range   Lactic Acid, Venous 1.8 0.5 - 1.9 mmol/L  Resp panel by RT-PCR (RSV, Flu A&B, Covid) Anterior Nasal Swab     Status: None   Collection Time: 06/14/23  8:26 PM   Specimen: Anterior Nasal Swab  Result Value Ref Range   SARS Coronavirus 2 by RT PCR NEGATIVE NEGATIVE    Comment: (NOTE) SARS-CoV-2 target nucleic acids are NOT DETECTED.  The SARS-CoV-2 RNA is generally detectable in upper respiratory specimens during the acute phase of infection. The lowest concentration of SARS-CoV-2 viral copies this assay can detect is 138 copies/mL. A negative result does not preclude SARS-Cov-2 infection and should not be used as the sole basis for treatment or other patient management decisions. A negative result may occur with  improper specimen collection/handling, submission of specimen other than nasopharyngeal swab, presence of viral mutation(s) within the areas targeted by this assay, and inadequate number of viral copies(<138 copies/mL). A negative result must be combined with clinical observations, patient history, and epidemiological information. The expected result is Negative.  Fact Sheet for Patients:  BloggerCourse.com  Fact Sheet for Healthcare Providers:  SeriousBroker.it  This test is no t yet approved or cleared by the [place] FDA and  has been authorized for detection and/or diagnosis of SARS-CoV-2 by FDA under an Emergency Use Authorization (EUA). This EUA will remain  in effect (meaning this test can be used) for the duration of the COVID-19 declaration under Section 564(b)(1) of the Act, 21 U.S.C.section 360bbb-3(b)(1), unless the authorization is terminated  or  revoked sooner.       Influenza A by PCR NEGATIVE NEGATIVE   Influenza B by PCR NEGATIVE NEGATIVE    Comment: (NOTE) The Xpert Xpress SARS-CoV-2/FLU/RSV plus assay is intended as an aid in the diagnosis of influenza from Nasopharyngeal swab specimens and should not be used as a sole basis for treatment. Nasal washings and aspirates are unacceptable for Xpert Xpress SARS-CoV-2/FLU/RSV testing.  Fact Sheet for Patients: BloggerCourse.com  Fact Sheet for Healthcare Providers: SeriousBroker.it  This test is not yet approved or cleared by the [place] FDA and has been authorized for detection and/or diagnosis of SARS-CoV-2 by FDA under an Emergency Use Authorization (EUA). This EUA will remain in effect (meaning this test can be used) for the duration of the COVID-19 declaration under Section 564(b)(1) of the Act, 21 U.S.C. section 360bbb-3(b)(1), unless the authorization is terminated or revoked.     Resp Syncytial Virus by PCR NEGATIVE NEGATIVE    Comment: (NOTE) Fact Sheet for Patients: BloggerCourse.com  Fact Sheet for Healthcare Providers: SeriousBroker.it  This test is not yet approved or cleared by the [place] FDA and has been authorized for detection and/or diagnosis of SARS-CoV-2 by FDA under an Emergency Use Authorization (EUA). This EUA will remain in effect (meaning this test can be used) for the duration of the COVID-19 declaration under Section 564(b)(1) of the Act, 21 U.S.C. section 360bbb-3(b)(1), unless the authorization is terminated or revoked.  Performed at [facility], [address], [place], [place] [address]    DG Chest Port 1 View  Result Date: 06/14/2023 CLINICAL DATA:  One-month history of fatigue and shortness of breath EXAM: PORTABLE CHEST 1 VIEW COMPARISON:  Chest radiograph dated 02/21/2021 FINDINGS: Normal lung  volumes. Minimal bilateral lower lung interstitial opacities. No pleural effusion or pneumothorax. Similar enlarged cardiomediastinal silhouette.  No acute osseous abnormality. IMPRESSION: 1. Minimal bilateral lower lung interstitial opacities, which may represent atelectasis, edema, or atypical infection. 2. Similar cardiomegaly. Electronically Signed   By: Agustin Cree M.D.   On: 06/14/2023 19:52    Pending Labs Unresulted Labs (From admission, onward)     Start     Ordered   06/15/23 0500  Basic metabolic panel  Tomorrow morning,   R        06/14/23 2205   06/15/23 0500  CBC  Tomorrow morning,   R        06/14/23 2205   06/15/23 0500  TSH  Tomorrow morning,   R        06/14/23 2205   06/15/23 0500  Magnesium  Tomorrow morning,   R        06/14/23 2205   06/14/23 2100  Urine Culture  Once,   URGENT       Question:  Indication  Answer:  Altered mental status (if no other cause identified)   06/14/23 2059   06/14/23 1913  Type and screen [facility]  Once,   STAT       Comments: [facility]  HOSPITAL    06/14/23 1912   06/14/23 1912  Blood Culture (routine x 2)  (Undifferentiated presentation (screening labs and basic nursing orders))  BLOOD CULTURE X 2,   STAT      06/14/23 1912   06/14/23 1912  Brain natriuretic peptide  (Undifferentiated presentation (screening labs and basic nursing orders))  ONCE - URGENT,   URGENT        06/14/23 1912            Vitals/Pain Today's Vitals   06/14/23 1838 06/14/23 1953 06/14/23 2005 06/14/23 2100  BP:  103/63 92/65 112/77  Pulse:  84 81 85  Resp:  (!) 27 (!) 22 19  Temp:      TempSrc:      SpO2:  99% 98% 97%  Weight:      Height:      PainSc: 0-No pain       Isolation Precautions No active isolations  Medications Medications  enoxaparin (LOVENOX) injection 30 mg (has no administration in time range)  acetaminophen (TYLENOL) tablet 500 mg (has no administration in time range)    Or  acetaminophen  (TYLENOL) suppository 650 mg (has no administration in time range)  ondansetron (ZOFRAN) tablet 4 mg (has no administration in time range)    Or  ondansetron (ZOFRAN) injection 4 mg (has no administration in time range)  ipratropium-albuterol (DUONEB) 0.5-2.5 (3) MG/3ML nebulizer solution 3 mL (has no administration in time range)  folic acid (FOLVITE) tablet 1 mg (has no administration in time range)  thiamine (VITAMIN B1) tablet 100 mg (has no administration in time range)  multivitamin with minerals tablet 1 tablet (has no administration in time range)  lactated ringers bolus 500 mL (0 mLs Intravenous Stopped 06/14/23 2106)  lactated ringers bolus 1,000 mL (0 mLs Intravenous Stopped 06/14/23 2205)  lactated ringers bolus 1,000 mL (1,000 mLs Intravenous New Bag/Given 06/14/23 2112)  cefTRIAXone (ROCEPHIN) 1 g in sodium chloride 0.9 % 100 mL IVPB (0 g Intravenous Stopped 06/14/23 2201)    Mobility walks with device     Focused Assessments Pulmonary Assessment Handoff:  Lung sounds:   O2 Device: Room Air      R Recommendations: See Admitting Provider Note  Report given to:   Additional Notes: N/A

## 2023-06-14 NOTE — H&P (Signed)
History and Physical    Patient: Joseph Tanner GNF:[id number] DOB: July 29, 1951 DOA: 06/14/2023 DOS: the patient was seen and examined on 06/14/2023 PCP: Pcp, No  Patient coming from: Home  Chief Complaint:  Chief Complaint  Patient presents with   Shortness of Breath   Fatigue   HPI: Joseph Tanner Kunsman is a 72 y.o. male with medical history significant for history of NSTEMI with PCI, chronic systolic heart failure EF=20%, CKD 3, hypertension, hyperlipidemia, and alcohol abuse.  About a month ago he had 3 teeth removed.  After that he had more trouble with breathing and increase in cough and severe increase in lower extremity edema.  He had been off of his all of his medications but he went to a clinic and got refills.  Since that time he has been back on his heart meds especially Entresto and furosemide and metoprolol.  His swelling has greatly improved.  His shortness of breath has improved but he still feels like he has no energy and he continues to have a productive cough.  He says he feels miserable and is just not getting better.  That is why he decided to come in for evaluation for today.  He denies any chest pain.  He denies fevers or chills.  His shortness of breath has improved but he has no energy and no appetite.  He has had intermittent diarrhea for months.  His last cardiology visit was in March of this year.  An echocardiogram, sleep study, and ZIO 3-day monitor were all recommended.  The echo (10/2022) revealed that his EF is less than 20%.  Also has restrictive disease with grade 3 diastolic dysfunction.  Years ago the patient was offered ICD placement but declined.    Review of Systems: As mentioned in the history of present illness. All other systems reviewed and are negative. Past Medical History:  Diagnosis Date   COPD (chronic obstructive pulmonary disease) (HCC)    Hyperlipemia    Hypertension    NSTEMI (non-ST elevated myocardial infarction) (HCC)    Pure  hypercholesterolemia 03/15/2021   History reviewed. No pertinent surgical history. Social History:  reports that he has been smoking. He has never used smokeless tobacco. He reports current alcohol use. He reports that he does not use drugs.  No Known Allergies  Family History  Problem Relation Age of Onset   Heart failure Mother    Heart failure Maternal Grandmother    Heart failure Maternal Grandfather    Heart failure Paternal Grandmother    Heart failure Paternal Grandfather     Prior to Admission medications   Medication Sig Start Date End Date Taking? Authorizing Provider  albuterol (VENTOLIN HFA) 108 (90 Base) MCG/ACT inhaler Inhale 2 puffs into the lungs every 6 (six) hours as needed for wheezing or shortness of breath.    [provider]  aspirin EC 81 MG EC tablet Take 1 tablet (81 mg total) by mouth daily. Swallow whole. 11/25/20   Standley Brooking, MD  atorvastatin (LIPITOR) 40 MG tablet Take 1 tablet (40 mg total) by mouth every evening. 01/29/23   Sharlene Dory, PA-C  carvedilol (COREG) 3.125 MG tablet Take 1 tablet (3.125 mg total) by mouth 2 (two) times daily with a meal. 01/31/23   Sharlene Dory, PA-C  dapagliflozin propanediol (FARXIGA) 10 MG TABS tablet Take 1 tablet (10 mg total) by mouth daily before breakfast. 01/29/23   Sharlene Dory, PA-C  furosemide (LASIX) 40 MG tablet Take 1  tablet (40 mg total) by mouth daily. 01/29/23   Sharlene Dory, PA-C  gabapentin (NEURONTIN) 100 MG capsule Take 100 mg by mouth daily as needed (pain).    [provider]  Multiple Vitamin (MULTIVITAMIN WITH MINERALS) TABS tablet Take 1 tablet by mouth daily. 11/25/20   Standley Brooking, MD  sacubitril-valsartan [facility]) 24-26 MG Take 1 tablet by mouth twice daily 01/29/23   Sharlene Dory, PA-C  spironolactone (ALDACTONE) 25 MG tablet Take 0.5 tablets (12.5 mg total) by mouth daily. 02/03/23   Sharlene Dory, PA-C  Tiotropium Bromide Monohydrate (SPIRIVA RESPIMAT) 1.25 MCG/ACT  AERS Inhale 2 puffs into the lungs daily. Patient taking differently: Inhale 2 puffs into the lungs as needed. 02/21/21   Coralyn Mark, NP    Physical Exam: Vitals:   06/14/23 1830 06/14/23 1953 06/14/23 2005 06/14/23 2100  BP: (!) 84/68 103/63 92/65 112/77  Pulse: 85 84 81 85  Resp: 17 (!) 27 (!) 22 19  Temp:      TempSrc:      SpO2: 99% 99% 98% 97%  Weight:      Height:       Physical Exam:  General: No acute distress, malnourished, HEENT: Normocephalic, atraumatic, PER, prominent arcus senilis, pupils are equal but sluggishly reactive, likely cataracts Cardiovascular: Normal rate and rhythm. Distal pulses intact. Pulmonary: Normal pulmonary effort, adventitial breath sounds in the left base Gastrointestinal: Nondistended abdomen, soft, non-tender, hypoactive bowel sounds, scaphoid Musculoskeletal:Normal ROM, 1+ lower ext edema, CLUBBING  Skin: Skin is warm and dry. Neuro: No focal deficits noted, AAOx3. PSYCH: Attentive and cooperative  Data Reviewed:  Results for orders placed or performed during the hospital encounter of 06/14/23 (from the past 24 hour(s))  Comprehensive metabolic panel     Status: Abnormal   Collection Time: 06/14/23  7:12 PM  Result Value Ref Range   Sodium 136 135 - 145 mmol/L   Potassium 3.7 3.5 - 5.1 mmol/L   Chloride 95 (L) 98 - 111 mmol/L   CO2 28 22 - 32 mmol/L   Glucose, Bld 112 (H) 70 - 99 mg/dL   BUN 28 (H) 8 - 23 mg/dL   Creatinine, Ser 0.45 (H) 0.61 - 1.24 mg/dL   Calcium 7.9 (L) 8.9 - 10.3 mg/dL   Total Protein 7.0 6.5 - 8.1 g/dL   Albumin 2.8 (L) 3.5 - 5.0 g/dL   AST 43 (H) 15 - 41 U/L   ALT 14 0 - 44 U/L   Alkaline Phosphatase 47 38 - 126 U/L   Total Bilirubin 2.8 (H) <1.2 mg/dL   GFR, Estimated 20 (L) >60 mL/min   Anion gap 13 5 - 15  CBC with Differential     Status: Abnormal   Collection Time: 06/14/23  7:12 PM  Result Value Ref Range   WBC 4.8 4.0 - 10.5 K/uL   RBC 4.38 4.22 - 5.81 MIL/uL   Hemoglobin 11.1 (L) 13.0  - 17.0 g/dL   HCT 40.9 (L) 81.1 - 91.4 %   MCV 82.0 80.0 - 100.0 fL   MCH 25.3 (L) 26.0 - 34.0 pg   MCHC 30.9 30.0 - 36.0 g/dL   RDW 78.2 (H) 95.6 - 21.3 %   Platelets 119 (L) 150 - 400 K/uL   nRBC 0.0 0.0 - 0.2 %   Neutrophils Relative % 61 %   Lymphocytes Relative 22 %   Lymphs Abs 1.1 0.7 - 4.0 K/uL   Monocytes Relative 15 %   Monocytes Absolute  0.7 0.1 - 1.0 K/uL   Eosinophils Relative 1 %   Eosinophils Absolute 0.0 0.0 - 0.5 K/uL   Basophils Relative 1 %   Basophils Absolute 0.0 0.0 - 0.1 K/uL   Immature Granulocytes 0 %   Neutro Abs 3.0 1.7 - 7.7 K/uL   Abs Immature Granulocytes 0.00 0.00 - 0.07 K/uL   Target Cells TARGET CELLS   Troponin I (High Sensitivity)     Status: Abnormal   Collection Time: 06/14/23  7:12 PM  Result Value Ref Range   Troponin I (High Sensitivity) 60 (H) <18 ng/L  Urinalysis, w/ Reflex to Culture (Infection Suspected) -Urine, Clean Catch     Status: Abnormal   Collection Time: 06/14/23  7:52 PM  Result Value Ref Range   Specimen Source URINE, CLEAN CATCH    Color, Urine AMBER (A) YELLOW   APPearance CLOUDY (A) CLEAR   Specific Gravity, Urine 1.012 1.005 - 1.030   pH 7.0 5.0 - 8.0   Glucose, UA NEGATIVE NEGATIVE mg/dL   Hgb urine dipstick NEGATIVE NEGATIVE   Bilirubin Urine NEGATIVE NEGATIVE   Ketones, ur NEGATIVE NEGATIVE mg/dL   Protein, ur 30 (A) NEGATIVE mg/dL   Nitrite NEGATIVE NEGATIVE   Leukocytes,Ua LARGE (A) NEGATIVE   RBC / HPF 11-20 0 - 5 RBC/hpf   WBC, UA >50 0 - 5 WBC/hpf   Bacteria, UA NONE SEEN NONE SEEN   Squamous Epithelial / HPF 21-50 0 - 5 /HPF   WBC Clumps PRESENT    Mucus PRESENT    Budding Yeast PRESENT    Hyaline Casts, UA PRESENT    Non Squamous Epithelial 0-5 (A) NONE SEEN  I-Stat Lactic Acid, ED     Status: None   Collection Time: 06/14/23  7:55 PM  Result Value Ref Range   Lactic Acid, Venous 1.8 0.5 - 1.9 mmol/L  Resp panel by RT-PCR (RSV, Flu A&B, Covid) Anterior Nasal Swab     Status: None   Collection  Time: 06/14/23  8:26 PM   Specimen: Anterior Nasal Swab  Result Value Ref Range   SARS Coronavirus 2 by RT PCR NEGATIVE NEGATIVE   Influenza A by PCR NEGATIVE NEGATIVE   Influenza B by PCR NEGATIVE NEGATIVE   Resp Syncytial Virus by PCR NEGATIVE NEGATIVE     Assessment and Plan: 1.FTT/ no energy/ productive cough/ no appetite/  clubbing - This could be heart failure but his EF has been bad for years. - Consider CT chest abdomen pelvis with contrast once creatinine improves R/o malignancy  2. AKI/ dehydration/mild hypotension - resolved - Hold Lasix. Pressure improved with IV fluids - Resume Entresto and cardiac meds once stable - Consider Echo  - Monitor Creatinine   3.  Severe cardiomyopathy-  Resume his cardiac medications as soon as possible.  4. Chronic diarrhea - GI referral    Advance Care Planning:   Code Status: Full Code  The patient names his daughter as his surrogate decision maker.  He will be full code by default CODE STATUS discussion deferred.  Consults: none  Family Communication: Patient's daughter at bedside  Severity of Illness: The appropriate patient status for this patient is INPATIENT. Inpatient status is judged to be reasonable and necessary in order to provide the required intensity of service to ensure the patient's safety. The patient's presenting symptoms, physical exam findings, and initial radiographic and laboratory data in the context of their chronic comorbidities is felt to place them at high risk for further clinical deterioration.  Furthermore, it is not anticipated that the patient will be medically stable for discharge from the hospital within 2 midnights of admission.   * I certify that at the point of admission it is my clinical judgment that the patient will require inpatient hospital care spanning beyond 2 midnights from the point of admission due to high intensity of service, high risk for further deterioration and high frequency of  surveillance required.*  Author: Buena Irish, MD 06/14/2023 10:08 PM  For on call review [email or web address].

## 2023-06-14 NOTE — ED Triage Notes (Signed)
Pt c/o SOB and fatigue x1 month.  Denies pain.  Pt reports bilateral feet were swollen, but they went down today.    Hx of CHF and COPD.

## 2023-06-15 ENCOUNTER — Inpatient Hospital Stay (HOSPITAL_COMMUNITY): Payer: Medicare HMO

## 2023-06-15 ENCOUNTER — Other Ambulatory Visit: Payer: Self-pay

## 2023-06-15 DIAGNOSIS — E43 Unspecified severe protein-calorie malnutrition: Secondary | ICD-10-CM | POA: Diagnosis not present

## 2023-06-15 DIAGNOSIS — N1832 Chronic kidney disease, stage 3b: Secondary | ICD-10-CM | POA: Diagnosis not present

## 2023-06-15 DIAGNOSIS — I5021 Acute systolic (congestive) heart failure: Secondary | ICD-10-CM | POA: Diagnosis not present

## 2023-06-15 DIAGNOSIS — I5042 Chronic combined systolic (congestive) and diastolic (congestive) heart failure: Secondary | ICD-10-CM | POA: Diagnosis not present

## 2023-06-15 DIAGNOSIS — N179 Acute kidney failure, unspecified: Secondary | ICD-10-CM

## 2023-06-15 LAB — ECHOCARDIOGRAM COMPLETE
AR max vel: 2.04 cm2
AV Area VTI: 1.94 cm2
AV Area mean vel: 2 cm2
AV Mean grad: 2 mm[Hg]
AV Peak grad: 2.8 mm[Hg]
Ao pk vel: 0.84 m/s
Area-P 1/2: 6.37 cm2
Est EF: 15
Height: 75 in
S' Lateral: 6.2 cm
Weight: 2278.67 [oz_av]

## 2023-06-15 LAB — RAPID URINE DRUG SCREEN, HOSP PERFORMED
Amphetamines: NOT DETECTED
Barbiturates: NOT DETECTED
Benzodiazepines: NOT DETECTED
Cocaine: POSITIVE — AB
Opiates: NOT DETECTED
Tetrahydrocannabinol: NOT DETECTED

## 2023-06-15 LAB — BASIC METABOLIC PANEL
Anion gap: 11 (ref 5–15)
Anion gap: 7 (ref 5–15)
BUN: 27 mg/dL — ABNORMAL HIGH (ref 8–23)
BUN: 27 mg/dL — ABNORMAL HIGH (ref 8–23)
CO2: 29 mmol/L (ref 22–32)
CO2: 29 mmol/L (ref 22–32)
Calcium: 7.5 mg/dL — ABNORMAL LOW (ref 8.9–10.3)
Calcium: 8 mg/dL — ABNORMAL LOW (ref 8.9–10.3)
Chloride: 97 mmol/L — ABNORMAL LOW (ref 98–111)
Chloride: 97 mmol/L — ABNORMAL LOW (ref 98–111)
Creatinine, Ser: 2.77 mg/dL — ABNORMAL HIGH (ref 0.61–1.24)
Creatinine, Ser: 2.9 mg/dL — ABNORMAL HIGH (ref 0.61–1.24)
GFR, Estimated: 22 mL/min — ABNORMAL LOW (ref >60)
GFR, Estimated: 24 mL/min — ABNORMAL LOW (ref >60)
Glucose, Bld: 91 mg/dL (ref 70–99)
Glucose, Bld: 93 mg/dL (ref 70–99)
Potassium: 3.7 mmol/L (ref 3.5–5.1)
Potassium: 3.8 mmol/L (ref 3.5–5.1)
Sodium: 133 mmol/L — ABNORMAL LOW (ref 135–145)
Sodium: 137 mmol/L (ref 135–145)

## 2023-06-15 LAB — CBC
HCT: 36.6 % — ABNORMAL LOW (ref 39.0–52.0)
Hemoglobin: 11 g/dL — ABNORMAL LOW (ref 13.0–17.0)
MCH: 24.9 pg — ABNORMAL LOW (ref 26.0–34.0)
MCHC: 30.1 g/dL (ref 30.0–36.0)
MCV: 83 fL (ref 80.0–100.0)
Platelets: 118 10*3/uL — ABNORMAL LOW (ref 150–400)
RBC: 4.41 MIL/uL (ref 4.22–5.81)
RDW: 22.3 % — ABNORMAL HIGH (ref 11.5–15.5)
WBC: 4.9 10*3/uL (ref 4.0–10.5)
nRBC: 0 % (ref 0.0–0.2)

## 2023-06-15 LAB — ABO/RH: ABO/RH(D): O POS

## 2023-06-15 LAB — MAGNESIUM
Magnesium: 1.1 mg/dL — ABNORMAL LOW (ref 1.7–2.4)
Magnesium: 1.1 mg/dL — ABNORMAL LOW (ref 1.7–2.4)

## 2023-06-15 LAB — TSH: TSH: 1.974 u[IU]/mL (ref 0.350–4.500)

## 2023-06-15 LAB — BRAIN NATRIURETIC PEPTIDE: B Natriuretic Peptide: 2436.6 pg/mL — ABNORMAL HIGH (ref 0.0–100.0)

## 2023-06-15 MED ORDER — MAGNESIUM SULFATE 2 GM/50ML IV SOLN
2.0000 g | Freq: Once | INTRAVENOUS | Status: AC
  Administered 2023-06-15: 2 g via INTRAVENOUS
  Filled 2023-06-15: qty 50

## 2023-06-15 MED ORDER — POTASSIUM CHLORIDE CRYS ER 20 MEQ PO TBCR
40.0000 meq | EXTENDED_RELEASE_TABLET | Freq: Once | ORAL | Status: AC
  Administered 2023-06-15: 40 meq via ORAL
  Filled 2023-06-15: qty 2

## 2023-06-15 MED ORDER — MELATONIN 5 MG PO TABS
5.0000 mg | ORAL_TABLET | Freq: Once | ORAL | Status: AC
  Administered 2023-06-16: 5 mg via ORAL
  Filled 2023-06-15: qty 1

## 2023-06-15 MED ORDER — MAGNESIUM OXIDE -MG SUPPLEMENT 400 (240 MG) MG PO TABS
400.0000 mg | ORAL_TABLET | Freq: Two times a day (BID) | ORAL | Status: DC
  Administered 2023-06-15 – 2023-06-19 (×8): 400 mg via ORAL
  Filled 2023-06-15 (×8): qty 1

## 2023-06-15 NOTE — Plan of Care (Signed)
  Problem: Education: Goal: Knowledge of General Education information will improve Description: Including pain rating scale, medication(s)/side effects and non-pharmacologic comfort measures Outcome: Progressing   Problem: Health Behavior/Discharge Planning: Goal: Ability to manage health-related needs will improve Outcome: Progressing   Problem: Clinical Measurements: Goal: Diagnostic test results will improve Outcome: Progressing Goal: Respiratory complications will improve Outcome: Progressing Goal: Cardiovascular complication will be avoided Outcome: Progressing   Problem: Activity: Goal: Risk for activity intolerance will decrease Outcome: Progressing   Problem: Safety: Goal: Ability to remain free from injury will improve Outcome: Progressing

## 2023-06-15 NOTE — Progress Notes (Signed)
  Echocardiogram 2D Echocardiogram has been performed.  Joseph Tanner 06/15/2023, 3:07 PM

## 2023-06-15 NOTE — Hospital Course (Signed)
Joseph Tanner is a 72 y.o. male with past medical history significant for history of NSTEMI with PCI, chronic systolic heart failure EF=20%, CKD 3, hypertension, hyperlipidemia, and alcohol abuse presented to hospital for fatigue, weakness.  About a month ago he had 3 teeth removed after which patient had shortness of breath and lower extremity edema.  He had been off all his medications but he went to a clinic and got refills.  Since that time he has been back on his heart meds especially Entresto and furosemide and metoprolol and swelling has greatly improved including shortness of breath but states that he has no energy and continues to have productive cough.  Last cardiology visit was March 2024.   2D echocardiogram, sleep study, and ZIO 3-day monitor were all recommended.  Review of 2D echo (10/2022) revealed that his EF is less than 20% with grade 3 diastolic dysfunction.  Years ago the patient was offered ICD placement but had declined.  Patient was then considered for admission to the hospital for further evaluation and treatment.  Assessment and Plan:  FTT/impaired appetite.  Could be secondary to heart failure.  Rule out malignancy.- Consider CT chest abdomen pelvis with contrast once creatinine improves.  Urine culture and blood cultures have been sent.  TSH within normal range at 1.9.   AKI/ dehydration/mild hypotension -received with IV fluids with Ringer lactate-1500 mL..  Patient did have 2D echocardiogram 6 months back.  Will repeat.  Creatinine from yesterday at 2.9.  Initial creatinine at 3.1.  Check BMP from today.  Lactate was 1.8.   Severe cardiomyopathy .   History of non-ST elevation MI status post PCI 2D echocardiogram 10/2022 with last known ejection fraction of 20%.  Will repeat 2D echogram.  BNP of 2436.  Mild thrombocytopenia.  No evidence of bleeding.  Will continue to monitor.  Hypomagnesemia.  Magnesium level was 1.1 yesterday.  Will replenish.  Check levels in  AM.  Cocaine abuse.  UDS shows cocaine.     Chronic diarrhea -would benefit from GI evaluation as outpatient.  CKD stage III Creatinine of 2.9 at presentation.  Will continue to monitor.  Hypertension On Entresto as outpatient.  Currently on hold.  Patient is supposed to be on Coreg as well.  Hyperlipidemia On Lipitor.

## 2023-06-15 NOTE — Progress Notes (Signed)
PROGRESS NOTE    Joseph Tanner  ZOX:[id number] DOB: [birth year] DOA: 06/14/2023 PCP: Pcp, No    Brief Narrative:   Joseph Tanner is a 72 y.o. male with past medical history significant for history of NSTEMI with PCI, chronic systolic heart failure EF=20%, CKD 3, hypertension, hyperlipidemia, and alcohol abuse presented to hospital for fatigue, weakness.  About a month ago he had 3 teeth removed after which patient had shortness of breath and lower extremity edema.  He had been off all his medications but he went to a clinic and got refills.  Since that time he has been back on his heart meds especially Entresto and furosemide and metoprolol and swelling has greatly improved including shortness of breath but states that he has no energy and continues to have productive cough.  Last cardiology visit was March 2024.   2D echocardiogram, sleep study, and ZIO 3-day monitor were all recommended.  Review of 2D echo (10/2022) revealed that his EF is less than 20% with grade 3 diastolic dysfunction.  Years ago, the patient was offered ICD placement but had declined.  Patient was then considered for admission to the hospital for further evaluation and treatment.  Assessment and Plan:  FTT/impaired appetite.  Could be secondary to heart failure.  Urine culture and blood cultures have been sent.  Pending culture report.  TSH within normal range at 1.9.  COVID and influenza was negative.   AKI/ dehydration/mild hypotension -  Patient did have 2D echocardiogram 6 months back.  Will repeat.  Creatinine from yesterday at 2.9.  Initial creatinine at 3.1.  Check BMP from today at 2.7..  Lactate was 1.8.  Patient received 2 and half liters of Ringer lactate bolus.   Severe cardiomyopathy .   History of non-ST elevation MI status post PCI 2D echocardiogram 10/2022 with last known ejection fraction of 20%.  Will repeat 2D echogram.  BNP of 2436.  Patient has been smoking cocaine.  Counseling  done.  Mild thrombocytopenia.  No evidence of bleeding.  Will continue to monitor.  Severe hypomagnesemia.  Magnesium level was 1.1.  Will replenish aggressively.  Check levels in AM.  Received 2 g of IV magnesium sulfate today.  Will repeat again.  Cocaine abuse.  UDS shows cocaine.  Extensive counseling was done.    Chronic diarrhea -would benefit from GI evaluation as outpatient.  Denies further diarrhea this morning.  CKD stage III Creatinine of 2.9 at presentation.  Will continue to monitor.  Creatinine on repeat was 2.7.  Hypertension On Entresto as outpatient.  Currently on hold.  Patient is supposed to be on Coreg as well.  Will hold off with beta-blocker for now due to cocaine on board.  Hyperlipidemia On Lipitor.  Debility deconditioning.  Check PT OT.    DVT prophylaxis: enoxaparin (LOVENOX) injection 30 mg Start: 06/14/23 2215   Code Status:     Code Status: Full Code  Disposition: Home likely in 1 to 2 days Status is: Inpatient  Remains inpatient appropriate because: Decompensated heart failure,   Family Communication: None at bedside  Consultants:  None  Procedures:  None  Antimicrobials:  None  Anti-infectives (From admission, onward)    Start     Dose/Rate Route Frequency Ordered Stop   06/14/23 2115  cefTRIAXone (ROCEPHIN) 1 g in sodium chloride 0.9 % 100 mL IVPB        1 g 200 mL/hr over 30 Minutes Intravenous  Once 06/14/23 2101 06/14/23 2201  Subjective: Today, patient was seen and examined at bedside.  Complains of fatigue weakness.  Less shortness of breath.  Has decreased appetite.  Objective: Vitals:   06/15/23 0138 06/15/23 0500 06/15/23 0957 06/15/23 1350  BP: 103/63 113/76 110/66 108/74  Pulse: 85 83 78 72  Resp: 20 16    Temp: 98.4 F (36.9 C) 98.6 F (37 C) 98.1 F (36.7 C) (!) 97.4 F (36.3 C)  TempSrc: Oral Oral Oral Oral  SpO2: 100% 100% 100% 100%  Weight:      Height:        Intake/Output Summary (Last 24  hours) at 06/15/2023 1452 Last data filed at 06/15/2023 1219 Gross per 24 hour  Intake 2210 ml  Output 125 ml  Net 2085 ml   Filed Weights   06/14/23 1827 06/14/23 2313  Weight: 77.1 kg 64.6 kg    Physical Examination: Body mass index is 17.8 kg/m.   General: Thinly built, not in obvious distress HENT:   No scleral pallor or icterus noted. Oral mucosa is moist.  Chest:  Clear breath sounds.  CVS: S1 &S2 heard. No murmur.  Regular rate and rhythm. Abdomen: Soft, nontender, nondistended.  Bowel sounds are heard.   Extremities: No cyanosis, clubbing with trace peripheral edema.  Peripheral pulses are palpable. Psych: Alert, awake and oriented, normal mood CNS:  No cranial nerve deficits.  Power equal in all extremities.   Skin: Warm and dry.  No rashes noted.  Data Reviewed:   CBC: Recent Labs  Lab 06/14/23 1912 06/14/23 2358  WBC 4.8 4.9  NEUTROABS 3.0  --   HGB 11.1* 11.0*  HCT 35.9* 36.6*  MCV 82.0 83.0  PLT 119* 118*    Basic Metabolic Panel: Recent Labs  Lab 06/14/23 1912 06/14/23 2358 06/15/23 1035  NA 136 137 133*  K 3.7 3.8 3.7  CL 95* 97* 97*  CO2 28 29 29   GLUCOSE 112* 91 93  BUN 28* 27* 27*  CREATININE 3.11* 2.90* 2.77*  CALCIUM 7.9* 8.0* 7.5*  MG  --  1.1* 1.1*    Liver Function Tests: Recent Labs  Lab 06/14/23 1912  AST 43*  ALT 14  ALKPHOS 47  BILITOT 2.8*  PROT 7.0  ALBUMIN 2.8*     Radiology Studies: DG Chest Port 1 View  Result Date: 06/14/2023 CLINICAL DATA:  One-month history of fatigue and shortness of breath EXAM: PORTABLE CHEST 1 VIEW COMPARISON:  Chest radiograph dated 02/21/2021 FINDINGS: Normal lung volumes. Minimal bilateral lower lung interstitial opacities. No pleural effusion or pneumothorax. Similar enlarged cardiomediastinal silhouette. No acute osseous abnormality. IMPRESSION: 1. Minimal bilateral lower lung interstitial opacities, which may represent atelectasis, edema, or atypical infection. 2. Similar  cardiomegaly. Electronically Signed   By: Agustin Cree M.D.   On: 06/14/2023 19:52      LOS: 1 day     Joycelyn Das, MD [facility] Available via Epic secure chat 7am-7pm After these hours, please refer to coverage provider listed on [email or web address] 06/15/2023, 2:52 PM

## 2023-06-16 DIAGNOSIS — I5042 Chronic combined systolic (congestive) and diastolic (congestive) heart failure: Secondary | ICD-10-CM | POA: Diagnosis not present

## 2023-06-16 DIAGNOSIS — I5043 Acute on chronic combined systolic (congestive) and diastolic (congestive) heart failure: Secondary | ICD-10-CM | POA: Diagnosis not present

## 2023-06-16 DIAGNOSIS — F1410 Cocaine abuse, uncomplicated: Secondary | ICD-10-CM

## 2023-06-16 DIAGNOSIS — I959 Hypotension, unspecified: Secondary | ICD-10-CM

## 2023-06-16 DIAGNOSIS — N189 Chronic kidney disease, unspecified: Secondary | ICD-10-CM

## 2023-06-16 DIAGNOSIS — E43 Unspecified severe protein-calorie malnutrition: Secondary | ICD-10-CM | POA: Diagnosis not present

## 2023-06-16 DIAGNOSIS — N179 Acute kidney failure, unspecified: Secondary | ICD-10-CM | POA: Diagnosis not present

## 2023-06-16 DIAGNOSIS — N1832 Chronic kidney disease, stage 3b: Secondary | ICD-10-CM | POA: Diagnosis not present

## 2023-06-16 LAB — BASIC METABOLIC PANEL
Anion gap: 8 (ref 5–15)
BUN: 31 mg/dL — ABNORMAL HIGH (ref 8–23)
CO2: 26 mmol/L (ref 22–32)
Calcium: 7.7 mg/dL — ABNORMAL LOW (ref 8.9–10.3)
Chloride: 100 mmol/L (ref 98–111)
Creatinine, Ser: 2.65 mg/dL — ABNORMAL HIGH (ref 0.61–1.24)
GFR, Estimated: 25 mL/min — ABNORMAL LOW (ref >60)
Glucose, Bld: 102 mg/dL — ABNORMAL HIGH (ref 70–99)
Potassium: 4.4 mmol/L (ref 3.5–5.1)
Sodium: 134 mmol/L — ABNORMAL LOW (ref 135–145)

## 2023-06-16 LAB — CBC
HCT: 29.3 % — ABNORMAL LOW (ref 39.0–52.0)
Hemoglobin: 9.1 g/dL — ABNORMAL LOW (ref 13.0–17.0)
MCH: 25.6 pg — ABNORMAL LOW (ref 26.0–34.0)
MCHC: 31.1 g/dL (ref 30.0–36.0)
MCV: 82.3 fL (ref 80.0–100.0)
Platelets: 92 10*3/uL — ABNORMAL LOW (ref 150–400)
RBC: 3.56 MIL/uL — ABNORMAL LOW (ref 4.22–5.81)
RDW: 22.2 % — ABNORMAL HIGH (ref 11.5–15.5)
WBC: 5.1 10*3/uL (ref 4.0–10.5)
nRBC: 0 % (ref 0.0–0.2)

## 2023-06-16 LAB — MAGNESIUM: Magnesium: 2.1 mg/dL (ref 1.7–2.4)

## 2023-06-16 NOTE — Progress Notes (Signed)
PROGRESS NOTE    Joseph Tanner  YQM:[id number] DOB: 04-22-1951 DOA: 06/14/2023 PCP: Pcp, No    Brief Narrative:   Joseph Tanner is a 72 y.o. male with past medical history significant for history of NSTEMI with PCI, chronic systolic heart failure EF=20%, CKD 3, hypertension, hyperlipidemia, and alcohol abuse presented to hospital for fatigue, weakness.  About a month ago he had 3 teeth removed after which patient had shortness of breath and lower extremity edema.  He had been off all his medications but he went to a clinic and got refills.  Since that time he has been back on his heart meds especially Entresto and furosemide and metoprolol and swelling has greatly improved including shortness of breath but states that he has no energy and continues to have productive cough.  Last cardiology visit was March 2024.   2D echocardiogram, sleep study, and ZIO 3-day monitor were all recommended.  Review of 2D echo (10/2022) revealed that his EF is less than 20% with grade 3 diastolic dysfunction.  Years ago, the patient was offered ICD placement but had declined.  Patient was then considered for admission to the hospital for further evaluation and treatment.  Assessment and Plan:  FTT/impaired appetite.  Could be secondary to heart failure.  Urine culture with more than 100,000 colonies of gram-negative rods.  Follow sensitivity.  Will start the patient on IV Rocephin.  Blood cultures negative in 1 day.  TSH within normal range at 1.9.  COVID and influenza was negative.   AKI/ dehydration/mild hypotension -  Patient did have 2D echocardiogram 6 months back.  Will repeat.   Initial creatinine at 3.1.  BMP today at 2.6.  Lactate was 1.8.  Patient received 2 and half liters of Ringer lactate bolus.  No further fluids.  Will get cardiology consultation.   Severe cardiomyopathy .   History of non-ST elevation MI status post PCI 2D echocardiogram 10/2022 with last known ejection fraction of  20%.  Repeat 2D echocardiogram showed LV ejection fraction of around 15%.  BNP of 2436.  Patient has been smoking cocaine.  Counseling done.  Discussed with the patient about worsening LV action fraction.  He wishes to discuss with cardiology for further option.  Will consult cardiology.  Mild thrombocytopenia.  No evidence of bleeding.  Will continue to monitor.  Severe hypomagnesemia.  Replenished with IV magnesium sulfate 4 g.  Magnesium level today at 2.1.  Will continue with magnesium oxide.  Cocaine abuse.  UDS shows cocaine.  Extensive counseling was done.    Chronic diarrhea -would benefit from GI evaluation as outpatient.   CKD stage III Creatinine of 2.9 at presentation.  Creatinine today at 2.6.  Hypertension On Entresto as outpatient.  Currently on hold.  Patient is supposed to be on Coreg as well.  Will hold off with beta-blocker for now due to cocaine on board.  Blood pressure seems to be stable at this time  Hyperlipidemia On Lipitor.  Debility deconditioning.  Check PT OT.    DVT prophylaxis: enoxaparin (LOVENOX) injection 30 mg Start: 06/14/23 2215   Code Status:     Code Status: Full Code  Disposition: Home likely in 1 to 2 days Status is: Inpatient  Remains inpatient appropriate because: Decompensated heart failure, worsening EF, cardiology evaluation   Family Communication: None at bedside  Consultants:  Cardiology  Procedures:  None  Antimicrobials:  None  Anti-infectives (From admission, onward)    Start     Dose/Rate Route  Frequency Ordered Stop   06/14/23 2115  cefTRIAXone (ROCEPHIN) 1 g in sodium chloride 0.9 % 100 mL IVPB        1 g 200 mL/hr over 30 Minutes Intravenous  Once 06/14/23 2101 06/14/23 2201      Subjective: Today, patient was seen and examined at bedside.  Patient complains of fatigue and weakness.  Has less shortness of breath.  Denies any chest pain, dizziness, lightheadedness   Objective: Vitals:   06/15/23 1350  06/15/23 2026 06/15/23 2158 06/16/23 0622  BP: 108/74 117/71 (!) 93/51 121/75  Pulse: 72 79 78 79  Resp:  14  16  Temp: (!) 97.4 F (36.3 C) 98.1 F (36.7 C) (!) 97.4 F (36.3 C) 97.9 F (36.6 C)  TempSrc: Oral Oral Oral Oral  SpO2: 100% 100% 100% 100%  Weight:      Height:        Intake/Output Summary (Last 24 hours) at 06/16/2023 1129 Last data filed at 06/15/2023 2200 Gross per 24 hour  Intake 760.03 ml  Output 325 ml  Net 435.03 ml   Filed Weights   06/14/23 1827 06/14/23 2313  Weight: 77.1 kg 64.6 kg    Physical Examination: Body mass index is 17.8 kg/m.   General: Thinly built, not in obvious distress alert awake and Communicative HENT:   No scleral pallor or icterus noted. Oral mucosa is moist.  Chest:  Clear breath sounds.  No wheezes or crackles. CVS: S1 &S2 heard. No murmur.  Regular rate and rhythm. Abdomen: Soft, nontender, nondistended.  Bowel sounds are heard.   Extremities: No cyanosis, clubbing with trace peripheral edema.  Peripheral pulses are palpable. Psych: Alert, awake and oriented, normal mood CNS:  No cranial nerve deficits.  Power equal in all extremities.   Skin: Warm and dry.  No rashes noted.  Data Reviewed:   CBC: Recent Labs  Lab 06/14/23 1912 06/14/23 2358 06/16/23 0420  WBC 4.8 4.9 5.1  NEUTROABS 3.0  --   --   HGB 11.1* 11.0* 9.1*  HCT 35.9* 36.6* 29.3*  MCV 82.0 83.0 82.3  PLT 119* 118* 92*    Basic Metabolic Panel: Recent Labs  Lab 06/14/23 1912 06/14/23 2358 06/15/23 1035 06/16/23 0420  NA 136 137 133* 134*  K 3.7 3.8 3.7 4.4  CL 95* 97* 97* 100  CO2 28 29 29 26   GLUCOSE 112* 91 93 102*  BUN 28* 27* 27* 31*  CREATININE 3.11* 2.90* 2.77* 2.65*  CALCIUM 7.9* 8.0* 7.5* 7.7*  MG  --  1.1* 1.1* 2.1    Liver Function Tests: Recent Labs  Lab 06/14/23 1912  AST 43*  ALT 14  ALKPHOS 47  BILITOT 2.8*  PROT 7.0  ALBUMIN 2.8*     Radiology Studies: ECHOCARDIOGRAM COMPLETE  Result Date: 06/15/2023     ECHOCARDIOGRAM REPORT   Patient Name:   Joseph Tanner [facility] Date of Exam: 06/15/2023 Medical Rec #:  [id number]               Height:       75.0 in Accession #:    [id number]              Weight:       142.4 lb Date of Birth:  Aug 21, 1950                BSA:          1.899 m Patient Age:    72 years  BP:           110/66 mmHg Patient Gender: M                       HR:           74 bpm. Exam Location:  Inpatient Procedure: 2D Echo, 3D Echo, Cardiac Doppler, Color Doppler and Strain Analysis Indications:    CHF- Acute Systolic  History:        Patient has prior history of Echocardiogram examinations, most                 recent 11/22/2022.  Sonographer:    Karma Ganja Referring Phys: [id number] [facility] Aleena Kirkeby  Sonographer Comments: Global longitudinal strain was attempted. IMPRESSIONS  1. Left ventricular ejection fraction, by estimation, is 15%. The left ventricle has severely decreased function. The left ventricle has no regional wall motion abnormalities. The left ventricular internal cavity size was moderately dilated. There is mild left ventricular hypertrophy. Left ventricular diastolic parameters are consistent with Grade III diastolic dysfunction (restrictive).  2. Right ventricular systolic function is mildly reduced. The right ventricular size is mildly enlarged. There is normal pulmonary artery systolic pressure. The estimated right ventricular systolic pressure is 30.5 mmHg.  3. Left atrial size was severely dilated.  4. Right atrial size was severely dilated.  5. The mitral valve is grossly normal. Trivial mitral valve regurgitation. No evidence of mitral stenosis.  6. The aortic valve is tricuspid. Aortic valve regurgitation is trivial. No aortic stenosis is present.  7. The inferior vena cava is normal in size with greater than 50% respiratory variability, suggesting right atrial pressure of 3 mmHg. FINDINGS  Left Ventricle: Left ventricular ejection fraction, by estimation, is 15%. The left  ventricle has severely decreased function. The left ventricle has no regional wall motion abnormalities. Global longitudinal strain performed but not reported based on interpreter judgement due to suboptimal tracking. 3D ejection fraction reviewed and evaluated as part of the interpretation. Alternate measurement of EF is felt to be most reflective of LV function. The left ventricular internal cavity size was moderately dilated. There is mild left ventricular hypertrophy. Left ventricular diastolic parameters are consistent with Grade III diastolic dysfunction (restrictive). Right Ventricle: The right ventricular size is mildly enlarged. Right vetricular wall thickness was not well visualized. Right ventricular systolic function is mildly reduced. There is normal pulmonary artery systolic pressure. The tricuspid regurgitant velocity is 2.62 m/s, and with an assumed right atrial pressure of 3 mmHg, the estimated right ventricular systolic pressure is 30.5 mmHg. Left Atrium: Left atrial size was severely dilated. Right Atrium: Right atrial size was severely dilated. Pericardium: There is no evidence of pericardial effusion. Mitral Valve: The mitral valve is grossly normal. Trivial mitral valve regurgitation. No evidence of mitral valve stenosis. Tricuspid Valve: The tricuspid valve is normal in structure. Tricuspid valve regurgitation is trivial. No evidence of tricuspid stenosis. Aortic Valve: The aortic valve is tricuspid. Aortic valve regurgitation is trivial. No aortic stenosis is present. Aortic valve mean gradient measures 2.0 mmHg. Aortic valve peak gradient measures 2.8 mmHg. Aortic valve area, by VTI measures 1.94 cm. Pulmonic Valve: The pulmonic valve was normal in structure. Pulmonic valve regurgitation is trivial. No evidence of pulmonic stenosis. Aorta: The aortic root is normal in size and structure. Venous: The inferior vena cava is normal in size with greater than 50% respiratory variability,  suggesting right atrial pressure of 3 mmHg. IAS/Shunts: No atrial level shunt detected by color  flow Doppler.  LEFT VENTRICLE PLAX 2D LVIDd:         6.50 cm   Diastology LVIDs:         6.20 cm   LV e' medial:    3.92 cm/s LV PW:         1.30 cm   LV E/e' medial:  17.8 LV IVS:        1.20 cm   LV e' lateral:   7.07 cm/s LVOT diam:     2.00 cm   LV E/e' lateral: 9.9 LV SV:         29 LV SV Index:   15 LVOT Area:     3.14 cm                           3D Volume EF:                          3D EF:        30 %                          LV EDV:       260 ml                          LV ESV:       182 ml                          LV SV:        79 ml RIGHT VENTRICLE            IVC RV Basal diam:  5.80 cm    IVC diam: 1.65 cm RV S prime:     8.27 cm/s TAPSE (M-mode): 1.5 cm LEFT ATRIUM              Index        RIGHT ATRIUM           Index LA diam:        3.60 cm  1.90 cm/m   RA Area:     29.30 cm LA Vol (A2C):   162.0 ml 85.29 ml/m  RA Volume:   112.00 ml 58.96 ml/m LA Vol (A4C):   61.3 ml  32.27 ml/m LA Biplane Vol: 97.8 ml  51.49 ml/m  AORTIC VALVE AV Area (Vmax):    2.04 cm AV Area (Vmean):   2.00 cm AV Area (VTI):     1.94 cm AV Vmax:           84.20 cm/s AV Vmean:          57.000 cm/s AV VTI:            0.149 m AV Peak Grad:      2.8 mmHg AV Mean Grad:      2.0 mmHg LVOT Vmax:         54.80 cm/s LVOT Vmean:        36.300 cm/s LVOT VTI:          0.092 m LVOT/AV VTI ratio: 0.62  AORTA Ao Root diam: 3.40 cm Ao Asc diam:  3.20 cm MITRAL VALVE               TRICUSPID VALVE MV Area (PHT): 6.37 cm    TR Peak grad:  27.5 mmHg MV Decel Time: 119 msec    TR Vmax:        262.00 cm/s MV E velocity: 69.80 cm/s MV A velocity: 25.70 cm/s  SHUNTS MV E/A ratio:  2.72        Systemic VTI:  0.09 m                            Systemic Diam: 2.00 cm Weston Brass MD Electronically signed by Weston Brass MD Signature Date/Time: 06/15/2023/3:47:29 PM    Final    DG Chest Port 1 View  Result Date: 06/14/2023 CLINICAL DATA:   One-month history of fatigue and shortness of breath EXAM: PORTABLE CHEST 1 VIEW COMPARISON:  Chest radiograph dated 02/21/2021 FINDINGS: Normal lung volumes. Minimal bilateral lower lung interstitial opacities. No pleural effusion or pneumothorax. Similar enlarged cardiomediastinal silhouette. No acute osseous abnormality. IMPRESSION: 1. Minimal bilateral lower lung interstitial opacities, which may represent atelectasis, edema, or atypical infection. 2. Similar cardiomegaly. Electronically Signed   By: Agustin Cree M.D.   On: 06/14/2023 19:52      LOS: 2 days     Joycelyn Das, MD [facility] Available via Epic secure chat 7am-7pm After these hours, please refer to coverage provider listed on [email or web address] 06/16/2023, 11:29 AM

## 2023-06-16 NOTE — Plan of Care (Signed)

## 2023-06-16 NOTE — Consult Note (Signed)
Cardiology Consultation   Patient ID: Joseph Tanner MRN: [id number]; DOB: 05-20-1951  Admit date: 06/14/2023 Date of Consult: 06/16/2023  PCP:  Aviva Kluver   [facility] Providers Cardiologist:  [name], MD   { Click here to update MD or APP on Care Team, Refresh:1}     Patient Profile:   Joseph Tanner is a 72 y.o. male with a hx of chronic systolic failure, CKD 3, CAD status post PCI, hypertension, hyperlipidemia, and alcohol abuse who is being seen 06/16/2023 for the evaluation of acute on chronic systolic diastolic heart failure at the request of Dr. Tyson Babinski.  History of Present Illness:   Joseph Tanner has a history of heart failure.  He was first diagnosed with heart failure in 2019.  He had a non-STEMI but reports not having a left heart cath at that time.  He had another NSTEMI 09/2020.  Echo at that time revealed LVEF 20-25%.  He had severe stenosis of a small left circumflex and mild to moderate stenosis of the RCA that was heavily calcified.  He has previously been offered ICD but declined.  He had a heart failure admission 10/2020 that improved with diuresis.  He was also started on Entresto at that time.  He has struggled with adherence and self discontinued his Entresto.  He was last seen in clinic 09/2022.  At that time he had worsening lower extremity edema and shortness of breath.  In clinic he was euvolemic and he was again referred to EP for ICD.  A 3-day monitor was ordered for complaint of palpitations but not completed.  Echo 10/2022 revealed LVEF less than 20% with prominent trabeculae in the left ventricular apex.  There was severe global hypokinesis.  RV function was also severely reduced.  PASP was 55 mmHg.  Joseph Tanner presented to the hospital 05/2023 complaining of increasing cough and shortness of breath as well as lower extremity edema.  This occurred in the setting of being off of all of his medications.  He was admitted with  failure to thrive and AKI.  Diuretics were held and his blood pressure improved with IV fluids.  He also complained of chronic diarrhea.  Urine tox was positive for cocaine.  UA was also positive for infection.  He was treated with ceftriaxone.  Entresto and his beta-blocker were held due to low blood pressure and AKI.  Echo this admission revealed LVEF less than 15% with mildly reduced RV function.  PASP 30 mmHg.  Had grade 3 diastolic dysfunction.  Cardiology was consulted for further management.   Past Medical History:  Diagnosis Date   COPD (chronic obstructive pulmonary disease) (HCC)    Hyperlipemia    Hypertension    NSTEMI (non-ST elevated myocardial infarction) (HCC)    Pure hypercholesterolemia 03/15/2021    History reviewed. No pertinent surgical history.   {Home Medications (Optional):21181}  Inpatient Medications: Scheduled Meds:  enoxaparin (LOVENOX) injection  30 mg Subcutaneous Q24H   folic acid  1 mg Oral Daily   magnesium oxide  400 mg Oral BID   multivitamin with minerals  1 tablet Oral Daily   thiamine  100 mg Oral Daily   traZODone  50 mg Oral QHS   Continuous Infusions:  PRN Meds: acetaminophen **OR** acetaminophen, ipratropium-albuterol, ondansetron **OR** ondansetron (ZOFRAN) IV  Allergies:   No Known Allergies  Social History:   Social History   Socioeconomic History   Marital status: Single    Spouse name: Not on file  Number of children: Not on file   Years of education: Not on file   Highest education level: Not on file  Occupational History   Not on file  Tobacco Use   Smoking status: Some Days   Smokeless tobacco: Never  Substance and Sexual Activity   Alcohol use: Yes   Drug use: Never   Sexual activity: Not on file  Other Topics Concern   Not on file  Social History Narrative   Not on file   Social Determinants of Health   Financial Resource Strain: Not on file  Food Insecurity: No Food Insecurity (06/14/2023)   Hunger Vital  Sign    Worried About Running Out of Food in the Last Year: Never true    Ran Out of Food in the Last Year: Never true  Transportation Needs: No Transportation Needs (06/16/2023)   PRAPARE - Administrator, Civil Service (Medical): No    Lack of Transportation (Non-Medical): No  Recent Concern: Transportation Needs - Unmet Transportation Needs (06/14/2023)   PRAPARE - Administrator, Civil Service (Medical): Yes    Lack of Transportation (Non-Medical): Yes  Physical Activity: Not on file  Stress: Not on file  Social Connections: Not on file  Intimate Partner Violence: Not At Risk (06/14/2023)   Humiliation, Afraid, Rape, and Kick questionnaire    Fear of Current or Ex-Partner: No    Emotionally Abused: No    Physically Abused: No    Sexually Abused: No    Family History:   *** Family History  Problem Relation Age of Onset   Heart failure Mother    Heart failure Maternal Grandmother    Heart failure Maternal Grandfather    Heart failure Paternal Grandmother    Heart failure Paternal Grandfather      ROS:  Please see the history of present illness.  *** All other ROS reviewed and negative.     Physical Exam/Data:   Vitals:   06/15/23 2026 06/15/23 2158 06/16/23 0622 06/16/23 1619  BP: 117/71 (!) 93/51 121/75 120/82  Pulse: 79 78 79 73  Resp: 14  16 16   Temp: 98.1 F (36.7 C) (!) 97.4 F (36.3 C) 97.9 F (36.6 C) 98.2 F (36.8 C)  TempSrc: Oral Oral Oral Oral  SpO2: 100% 100% 100% 100%  Weight:      Height:        Intake/Output Summary (Last 24 hours) at 06/16/2023 1710 Last data filed at 06/15/2023 2200 Gross per 24 hour  Intake 760.03 ml  Output 200 ml  Net 560.03 ml      06/14/2023   11:13 PM 06/14/2023    6:27 PM 10/24/2022   10:44 AM  Last 3 Weights  Weight (lbs) 142 lb 6.7 oz 170 lb 155 lb 12.8 oz  Weight (kg) 64.6 kg 77.111 kg 70.67 kg     Body mass index is 17.8 kg/m.  General:  Well nourished, well developed, in no  acute distress*** HEENT: normal Neck: no JVD Vascular: No carotid bruits; Distal pulses 2+ bilaterally Cardiac:  normal S1, S2; RRR; no murmur *** Lungs:  clear to auscultation bilaterally, no wheezing, rhonchi or rales  Abd: soft, nontender, no hepatomegaly  Ext: no edema Musculoskeletal:  No deformities, BUE and BLE strength normal and equal Skin: warm and dry  Neuro:  CNs 2-12 intact, no focal abnormalities noted Psych:  Normal affect   EKG:  The EKG was personally reviewed and demonstrates:  *** Telemetry:  Telemetry  was personally reviewed and demonstrates:  ***  Relevant CV Studies:  Echo 06/15/23: IMPRESSIONS    1. Left ventricular ejection fraction, by estimation, is 15%. The left  ventricle has severely decreased function. The left ventricle has no  regional wall motion abnormalities. The left ventricular internal cavity  size was moderately dilated. There is  mild left ventricular hypertrophy. Left ventricular diastolic parameters  are consistent with Grade III diastolic dysfunction (restrictive).   2. Right ventricular systolic function is mildly reduced. The right  ventricular size is mildly enlarged. There is normal pulmonary artery  systolic pressure. The estimated right ventricular systolic pressure is  30.5 mmHg.   3. Left atrial size was severely dilated.   4. Right atrial size was severely dilated.   5. The mitral valve is grossly normal. Trivial mitral valve  regurgitation. No evidence of mitral stenosis.   6. The aortic valve is tricuspid. Aortic valve regurgitation is trivial.  No aortic stenosis is present.   7. The inferior vena cava is normal in size with greater than 50%  respiratory variability, suggesting right atrial pressure of 3 mmHg.    Laboratory Data:  High Sensitivity Troponin:   Recent Labs  Lab 06/14/23 1912  TROPONINIHS 60*     Chemistry Recent Labs  Lab 06/14/23 2358 06/15/23 1035 06/16/23 0420  NA 137 133* 134*  K 3.8 3.7  4.4  CL 97* 97* 100  CO2 29 29 26   GLUCOSE 91 93 102*  BUN 27* 27* 31*  CREATININE 2.90* 2.77* 2.65*  CALCIUM 8.0* 7.5* 7.7*  MG 1.1* 1.1* 2.1  GFRNONAA 22* 24* 25*  ANIONGAP 11 7 8     Recent Labs  Lab 06/14/23 1912  PROT 7.0  ALBUMIN 2.8*  AST 43*  ALT 14  ALKPHOS 47  BILITOT 2.8*   Lipids No results for input(s): "CHOL", "TRIG", "HDL", "LABVLDL", "LDLCALC", "CHOLHDL" in the last 168 hours.  Hematology Recent Labs  Lab 06/14/23 1912 06/14/23 2358 06/16/23 0420  WBC 4.8 4.9 5.1  RBC 4.38 4.41 3.56*  HGB 11.1* 11.0* 9.1*  HCT 35.9* 36.6* 29.3*  MCV 82.0 83.0 82.3  MCH 25.3* 24.9* 25.6*  MCHC 30.9 30.1 31.1  RDW 22.5* 22.3* 22.2*  PLT 119* 118* 92*   Thyroid  Recent Labs  Lab 06/14/23 2358  TSH 1.974    BNP Recent Labs  Lab 06/14/23 2358  BNP 2,436.6*    DDimer No results for input(s): "DDIMER" in the last 168 hours.   Radiology/Studies:  ECHOCARDIOGRAM COMPLETE  Result Date: 06/15/2023    ECHOCARDIOGRAM REPORT   Patient Name:   Joseph Tanner [facility] Date of Exam: 06/15/2023 Medical Rec #:  [id number]               Height:       75.0 in Accession #:    [id number]              Weight:       142.4 lb Date of Birth:  02/28/1951                BSA:          1.899 m Patient Age:    72 years                BP:           110/66 mmHg Patient Gender: M  HR:           74 bpm. Exam Location:  Inpatient Procedure: 2D Echo, 3D Echo, Cardiac Doppler, Color Doppler and Strain Analysis Indications:    CHF- Acute Systolic  History:        Patient has prior history of Echocardiogram examinations, most                 recent 11/22/2022.  Sonographer:    Karma Ganja Referring Phys: [id number] [facility] POKHREL  Sonographer Comments: Global longitudinal strain was attempted. IMPRESSIONS  1. Left ventricular ejection fraction, by estimation, is 15%. The left ventricle has severely decreased function. The left ventricle has no regional wall motion abnormalities. The left  ventricular internal cavity size was moderately dilated. There is mild left ventricular hypertrophy. Left ventricular diastolic parameters are consistent with Grade III diastolic dysfunction (restrictive).  2. Right ventricular systolic function is mildly reduced. The right ventricular size is mildly enlarged. There is normal pulmonary artery systolic pressure. The estimated right ventricular systolic pressure is 30.5 mmHg.  3. Left atrial size was severely dilated.  4. Right atrial size was severely dilated.  5. The mitral valve is grossly normal. Trivial mitral valve regurgitation. No evidence of mitral stenosis.  6. The aortic valve is tricuspid. Aortic valve regurgitation is trivial. No aortic stenosis is present.  7. The inferior vena cava is normal in size with greater than 50% respiratory variability, suggesting right atrial pressure of 3 mmHg. FINDINGS  Left Ventricle: Left ventricular ejection fraction, by estimation, is 15%. The left ventricle has severely decreased function. The left ventricle has no regional wall motion abnormalities. Global longitudinal strain performed but not reported based on interpreter judgement due to suboptimal tracking. 3D ejection fraction reviewed and evaluated as part of the interpretation. Alternate measurement of EF is felt to be most reflective of LV function. The left ventricular internal cavity size was moderately dilated. There is mild left ventricular hypertrophy. Left ventricular diastolic parameters are consistent with Grade III diastolic dysfunction (restrictive). Right Ventricle: The right ventricular size is mildly enlarged. Right vetricular wall thickness was not well visualized. Right ventricular systolic function is mildly reduced. There is normal pulmonary artery systolic pressure. The tricuspid regurgitant velocity is 2.62 m/s, and with an assumed right atrial pressure of 3 mmHg, the estimated right ventricular systolic pressure is 30.5 mmHg. Left Atrium:  Left atrial size was severely dilated. Right Atrium: Right atrial size was severely dilated. Pericardium: There is no evidence of pericardial effusion. Mitral Valve: The mitral valve is grossly normal. Trivial mitral valve regurgitation. No evidence of mitral valve stenosis. Tricuspid Valve: The tricuspid valve is normal in structure. Tricuspid valve regurgitation is trivial. No evidence of tricuspid stenosis. Aortic Valve: The aortic valve is tricuspid. Aortic valve regurgitation is trivial. No aortic stenosis is present. Aortic valve mean gradient measures 2.0 mmHg. Aortic valve peak gradient measures 2.8 mmHg. Aortic valve area, by VTI measures 1.94 cm. Pulmonic Valve: The pulmonic valve was normal in structure. Pulmonic valve regurgitation is trivial. No evidence of pulmonic stenosis. Aorta: The aortic root is normal in size and structure. Venous: The inferior vena cava is normal in size with greater than 50% respiratory variability, suggesting right atrial pressure of 3 mmHg. IAS/Shunts: No atrial level shunt detected by color flow Doppler.  LEFT VENTRICLE PLAX 2D LVIDd:         6.50 cm   Diastology LVIDs:         6.20 cm   LV e' medial:  3.92 cm/s LV PW:         1.30 cm   LV E/e' medial:  17.8 LV IVS:        1.20 cm   LV e' lateral:   7.07 cm/s LVOT diam:     2.00 cm   LV E/e' lateral: 9.9 LV SV:         29 LV SV Index:   15 LVOT Area:     3.14 cm                           3D Volume EF:                          3D EF:        30 %                          LV EDV:       260 ml                          LV ESV:       182 ml                          LV SV:        79 ml RIGHT VENTRICLE            IVC RV Basal diam:  5.80 cm    IVC diam: 1.65 cm RV S prime:     8.27 cm/s TAPSE (M-mode): 1.5 cm LEFT ATRIUM              Index        RIGHT ATRIUM           Index LA diam:        3.60 cm  1.90 cm/m   RA Area:     29.30 cm LA Vol (A2C):   162.0 ml 85.29 ml/m  RA Volume:   112.00 ml 58.96 ml/m LA Vol (A4C):   61.3  ml  32.27 ml/m LA Biplane Vol: 97.8 ml  51.49 ml/m  AORTIC VALVE AV Area (Vmax):    2.04 cm AV Area (Vmean):   2.00 cm AV Area (VTI):     1.94 cm AV Vmax:           84.20 cm/s AV Vmean:          57.000 cm/s AV VTI:            0.149 m AV Peak Grad:      2.8 mmHg AV Mean Grad:      2.0 mmHg LVOT Vmax:         54.80 cm/s LVOT Vmean:        36.300 cm/s LVOT VTI:          0.092 m LVOT/AV VTI ratio: 0.62  AORTA Ao Root diam: 3.40 cm Ao Asc diam:  3.20 cm MITRAL VALVE               TRICUSPID VALVE MV Area (PHT): 6.37 cm    TR Peak grad:   27.5 mmHg MV Decel Time: 119 msec    TR Vmax:        262.00 cm/s MV E velocity: 69.80 cm/s MV A velocity: 25.70 cm/s  SHUNTS MV E/A ratio:  2.72  Systemic VTI:  0.09 m                            Systemic Diam: 2.00 cm Weston Brass MD Electronically signed by Weston Brass MD Signature Date/Time: 06/15/2023/3:47:29 PM    Final    DG Chest Port 1 View  Result Date: 06/14/2023 CLINICAL DATA:  One-month history of fatigue and shortness of breath EXAM: PORTABLE CHEST 1 VIEW COMPARISON:  Chest radiograph dated 02/21/2021 FINDINGS: Normal lung volumes. Minimal bilateral lower lung interstitial opacities. No pleural effusion or pneumothorax. Similar enlarged cardiomediastinal silhouette. No acute osseous abnormality. IMPRESSION: 1. Minimal bilateral lower lung interstitial opacities, which may represent atelectasis, edema, or atypical infection. 2. Similar cardiomegaly. Electronically Signed   By: Agustin Cree M.D.   On: 06/14/2023 19:52     Assessment and Plan:   # Acute on chronic systolic and diastolic HF: Will again refer to EP for ICD. He has a narrow QRS so not a candidate for CRT-D.   # Cocaine abuse: Very important to abstain.    Risk Assessment/Risk Scores:  {Complete the following score calculators/questions to meet required metrics.  Press F2         :[id number]}   {Is the patient being seen for unstable angina, ACS, NSTEMI or STEMI?:[phone]} {Does  this patient have CHF or CHF symptoms?      :[id number]} {Does this patient have ATRIAL FIBRILLATION?:[phone]}  {Are we signing off today?:[id number]}  For questions or updates, please contact [facility] Please consult [email or web address] for contact info under    Signed, [name], MD  06/16/2023 5:10 PM

## 2023-06-16 NOTE — Evaluation (Signed)
Physical Therapy Brief Evaluation and Discharge Note Patient Details Name: Joseph Tanner MRN: [id number] DOB: 07-11-1951 Today's Date: 06/16/2023   History of Present Illness  Patient is a 72 yo male presenting to the ED with SOB and fatigue over the last month on 06/14/23. Admitted with AKI,  PMH includes: NSTEMI with PCI, chronic systolic heart failure EF=20%, CKD 3, hypertension, hyperlipidemia, and alcohol abuse  Clinical Impression  Mod Ind with cane. Tolerated ambulation well. O2 90% on RA. No acute PT needs. 1x eval. Will sign off.        PT Assessment Patient does not need any further PT services  Assistance Needed at Discharge  None    Equipment Recommendations None recommended by PT  Recommendations for Other Services       Precautions/Restrictions Precautions Precautions: None Restrictions Weight Bearing Restrictions: No        Mobility  Bed Mobility   Supine/Sidelying to sit: Independent      Transfers Overall transfer level: Independent                      Ambulation/Gait Ambulation/Gait assistance: Modified independent (Device/Increase time) Gait Distance (Feet): 200 Feet Assistive device: Straight cane     General Gait Details: no lob. O2 99% on RA.  Home Activity Instructions    Stairs            Modified Rankin (Stroke Patients Only)        Balance Overall balance assessment: Mild deficits observed, not formally tested                        Pertinent Vitals/Pain   Pain Assessment Pain Assessment: No/denies pain     Home Living Family/patient expects to be discharged to:: Private residence Living Arrangements: Alone   Home Environment: Stairs to enter Advertising account executive)  Landscape architect of Steps: 1 flight Home Equipment: Cane - single point   Additional Comments: has elevator that he uses primarily    Prior Function Level of Independence: Independent with assistive device(s)      UE/LE  Assessment   UE ROM/Strength/Tone/Coordination: WFL    LE ROM/Strength/Tone/Coordination: Generalized weakness      Communication   Communication Communication: No apparent difficulties     Cognition Overall Cognitive Status: Appears within functional limits for tasks assessed/performed       General Comments      Exercises     Assessment/Plan    PT Problem List         PT Visit Diagnosis      No Skilled PT Patient is modified independent with all activity/mobility   Co-evaluation                AMPAC 6 Clicks Help needed turning from your back to your side while in a flat bed without using bedrails?: None Help needed moving from lying on your back to sitting on the side of a flat bed without using bedrails?: None Help needed moving to and from a bed to a chair (including a wheelchair)?: None Help needed standing up from a chair using your arms (e.g., wheelchair or bedside chair)?: None Help needed to walk in hospital room?: None Help needed climbing 3-5 steps with a railing? : A Little 6 Click Score: 23      End of Session   Activity Tolerance: Patient tolerated treatment well Patient left: in bed;with call bell/phone within reach  Time: 1145-1200 PT Time Calculation (min) (ACUTE ONLY): 15 min  Charges:   PT Evaluation $PT Eval Low Complexity: 1 Low         Faye Ramsay, PT Acute Rehabilitation  Office: [phone]

## 2023-06-16 NOTE — Plan of Care (Signed)
  Problem: Education: Goal: Knowledge of General Education information will improve Description: Including pain rating scale, medication(s)/side effects and non-pharmacologic comfort measures Outcome: Progressing   Problem: Health Behavior/Discharge Planning: Goal: Ability to manage health-related needs will improve Outcome: Progressing   Problem: Activity: Goal: Risk for activity intolerance will decrease Outcome: Progressing   Problem: Coping: Goal: Level of anxiety will decrease Outcome: Progressing   Problem: Elimination: Goal: Will not experience complications related to urinary retention Outcome: Progressing   Problem: Pain Management: Goal: General experience of comfort will improve Outcome: Progressing

## 2023-06-16 NOTE — TOC CM/SW Note (Signed)
Transition of Care [facility]) - Inpatient Brief Assessment   Patient Details  Name: Joseph Tanner MRN: [id number] Date of Birth: November 16, 1950  Transition of Care [facility] CM/SW Contact:    Larrie Kass, LCSW Phone Number: 06/16/2023, 11:39 AM   Clinical Narrative: Spoke with pt regarding PCP. Pt reported he would just like a provider list due to not having transportation.   CSW spoke with pt regarding consult for substance use resources. Pt has declined stating "That is not the issues right now.". TOC to follow for d/c needs.    Transition of Care Asessment: Insurance and Status: Insurance coverage has been reviewed Patient has primary care physician: No (given provider list) Home environment has been reviewed: home with self Prior level of function:: independent Prior/Current Home Services: No current home services Social Determinants of Health Reivew: SDOH reviewed no interventions necessary Readmission risk has been reviewed: Yes Transition of care needs: no transition of care needs at this time

## 2023-06-16 NOTE — Evaluation (Signed)
Occupational Therapy Evaluation Patient Details Name: Joseph Tanner MRN: [id number] DOB: 1950/08/19 Today's Date: 06/16/2023   History of Present Illness Patient is a 72 yo male presenting to the ED with SOB and fatigue over the last month on 06/14/23. Admitted with AKI,  PMH includes: NSTEMI with PCI, chronic systolic heart failure EF=20%, CKD 3, hypertension, hyperlipidemia, and alcohol abuse   Clinical Impression   Prior this admission, patient independent with ambulation with a cane, and independent with ADL management. Patient does not drive, and relies on neighbors to assist with groceries and getting medications. Currently, patient is at his baseline for ADL management. Patient able to ambulate independently to bathroom, complete ADLs in standing without LOB or need for external assist. Patient with no current OT needs. OT will sign off at this time. Please re-consult if further acute needs arise.      If plan is discharge home, recommend the following: Assist for transportation    Functional Status Assessment  Patient has had a recent decline in their functional status and demonstrates the ability to make significant improvements in function in a reasonable and predictable amount of time.  Equipment Recommendations  None recommended by OT    Recommendations for Other Services       Precautions / Restrictions Precautions Precautions: Fall Restrictions Weight Bearing Restrictions: No      Mobility Bed Mobility Overal bed mobility: Independent                  Transfers Overall transfer level: Independent                        Balance Overall balance assessment: Independent                                         ADL either performed or assessed with clinical judgement   ADL Overall ADL's : At baseline                                       General ADL Comments: Patient is at his baseline for ADL  management. Patient able to ambulate independently to bathroom, complete ADLs in standing without LOB or need for external assist. Patient with no current OT needs. OT will sign off at this time. Please re-consult if further acute needs arise.     Vision Baseline Vision/History: 1 Wears glasses Ability to See in Adequate Light: 0 Adequate Patient Visual Report: No change from baseline Vision Assessment?: Wears glasses for reading     Perception Perception: Not tested       Praxis Praxis: Not tested       Pertinent Vitals/Pain Pain Assessment Pain Assessment: No/denies pain     Extremity/Trunk Assessment Upper Extremity Assessment Upper Extremity Assessment: Overall WFL for tasks assessed   Lower Extremity Assessment Lower Extremity Assessment: Overall WFL for tasks assessed;Defer to PT evaluation   Cervical / Trunk Assessment Cervical / Trunk Assessment: Normal   Communication Communication Communication: No apparent difficulties Cueing Techniques: Verbal cues   Cognition Arousal: Alert Behavior During Therapy: WFL for tasks assessed/performed Overall Cognitive Status: Within Functional Limits for tasks assessed  General Comments       Exercises     Shoulder Instructions      Home Living Family/patient expects to be discharged to:: Private residence Living Arrangements: Alone Available Help at Discharge:  (No real help) Type of Home: Apartment Home Access: Elevator;Stairs to enter Entrance Stairs-Number of Steps: 14/1 flight-only if elevator out of service Entrance Stairs-Rails: Can reach both Home Layout: One level     Bathroom Shower/Tub: Producer, television/film/video: Standard     Home Equipment: [employer] - single point   Additional Comments: has elevator that he uses primarily      Prior Functioning/Environment Prior Level of Function : Independent/Modified Independent              Mobility Comments: independent with cane ADLs Comments: independent, does not drive        OT Problem List: Decreased activity tolerance      OT Treatment/Interventions:      OT Goals(Current goals can be found in the care plan section) Acute Rehab OT Goals Patient Stated Goal: to get all this sorted out OT Goal Formulation: With patient Time For Goal Achievement: 06/30/23 Potential to Achieve Goals: Good  OT Frequency:      Co-evaluation              AM-PAC OT "6 Clicks" Daily Activity     Outcome Measure Help from another person eating meals?: None Help from another person taking care of personal grooming?: None Help from another person toileting, which includes using toliet, bedpan, or urinal?: None Help from another person bathing (including washing, rinsing, drying)?: None Help from another person to put on and taking off regular upper body clothing?: None Help from another person to put on and taking off regular lower body clothing?: None 6 Click Score: 24   End of Session Nurse Communication: Mobility status  Activity Tolerance: Patient tolerated treatment well Patient left: in bed;with call bell/phone within reach  OT Visit Diagnosis: Unsteadiness on feet (R26.81)                Time: 4098-1191 OT Time Calculation (min): 11 min Charges:  OT General Charges $OT Visit: 1 Visit OT Evaluation $OT Eval Moderate Complexity: 1 Mod  Pollyann Glen E. Kristeen Lantz, OTR/L Acute Rehabilitation Services [phone]   Cherlyn Cushing 06/16/2023, 12:16 PM

## 2023-06-17 ENCOUNTER — Telehealth (HOSPITAL_COMMUNITY): Payer: Self-pay | Admitting: Licensed Clinical Social Worker

## 2023-06-17 ENCOUNTER — Encounter (HOSPITAL_COMMUNITY): Payer: Self-pay | Admitting: Internal Medicine

## 2023-06-17 DIAGNOSIS — F1410 Cocaine abuse, uncomplicated: Secondary | ICD-10-CM

## 2023-06-17 DIAGNOSIS — N179 Acute kidney failure, unspecified: Secondary | ICD-10-CM | POA: Diagnosis not present

## 2023-06-17 DIAGNOSIS — I5042 Chronic combined systolic (congestive) and diastolic (congestive) heart failure: Secondary | ICD-10-CM | POA: Diagnosis not present

## 2023-06-17 DIAGNOSIS — N1832 Chronic kidney disease, stage 3b: Secondary | ICD-10-CM | POA: Diagnosis not present

## 2023-06-17 DIAGNOSIS — E43 Unspecified severe protein-calorie malnutrition: Secondary | ICD-10-CM | POA: Diagnosis not present

## 2023-06-17 DIAGNOSIS — I959 Hypotension, unspecified: Secondary | ICD-10-CM

## 2023-06-17 DIAGNOSIS — I5043 Acute on chronic combined systolic (congestive) and diastolic (congestive) heart failure: Secondary | ICD-10-CM

## 2023-06-17 LAB — BASIC METABOLIC PANEL
Anion gap: 8 (ref 5–15)
BUN: 29 mg/dL — ABNORMAL HIGH (ref 8–23)
CO2: 24 mmol/L (ref 22–32)
Calcium: 8 mg/dL — ABNORMAL LOW (ref 8.9–10.3)
Chloride: 99 mmol/L (ref 98–111)
Creatinine, Ser: 2.47 mg/dL — ABNORMAL HIGH (ref 0.61–1.24)
GFR, Estimated: 27 mL/min — ABNORMAL LOW (ref >60)
Glucose, Bld: 92 mg/dL (ref 70–99)
Potassium: 4.7 mmol/L (ref 3.5–5.1)
Sodium: 131 mmol/L — ABNORMAL LOW (ref 135–145)

## 2023-06-17 LAB — CBC
HCT: 29.1 % — ABNORMAL LOW (ref 39.0–52.0)
Hemoglobin: 9 g/dL — ABNORMAL LOW (ref 13.0–17.0)
MCH: 25.5 pg — ABNORMAL LOW (ref 26.0–34.0)
MCHC: 30.9 g/dL (ref 30.0–36.0)
MCV: 82.4 fL (ref 80.0–100.0)
Platelets: 89 10*3/uL — ABNORMAL LOW (ref 150–400)
RBC: 3.53 MIL/uL — ABNORMAL LOW (ref 4.22–5.81)
RDW: 22.1 % — ABNORMAL HIGH (ref 11.5–15.5)
WBC: 4.7 10*3/uL (ref 4.0–10.5)
nRBC: 0 % (ref 0.0–0.2)

## 2023-06-17 MED ORDER — ATORVASTATIN CALCIUM 40 MG PO TABS
40.0000 mg | ORAL_TABLET | Freq: Every evening | ORAL | Status: DC
  Administered 2023-06-17 – 2023-06-18 (×2): 40 mg via ORAL
  Filled 2023-06-17 (×2): qty 1

## 2023-06-17 MED ORDER — SODIUM CHLORIDE 0.9 % IV SOLN
1.0000 g | Freq: Every day | INTRAVENOUS | Status: DC
  Administered 2023-06-17 – 2023-06-19 (×3): 1 g via INTRAVENOUS
  Filled 2023-06-17 (×3): qty 10

## 2023-06-17 MED ORDER — CARVEDILOL 3.125 MG PO TABS
3.1250 mg | ORAL_TABLET | Freq: Two times a day (BID) | ORAL | Status: DC
  Administered 2023-06-17 – 2023-06-19 (×4): 3.125 mg via ORAL
  Filled 2023-06-17 (×4): qty 1

## 2023-06-17 MED ORDER — ASPIRIN 81 MG PO TBEC
81.0000 mg | DELAYED_RELEASE_TABLET | Freq: Every day | ORAL | Status: DC
Start: 2023-06-17 — End: 2023-06-19
  Administered 2023-06-17 – 2023-06-19 (×3): 81 mg via ORAL
  Filled 2023-06-17 (×3): qty 1

## 2023-06-17 NOTE — Progress Notes (Addendum)
PROGRESS NOTE    Joseph Tanner  GUY:[id number] DOB: [birth year] DOA: 06/14/2023 PCP: Pcp, No    Brief Narrative:   Joseph Tanner is a 72 y.o. male with past medical history significant for history of NSTEMI with PCI, chronic systolic heart failure EF=20%, CKD 3, hypertension, hyperlipidemia, and alcohol abuse presented to hospital for fatigue, weakness.  About a month ago he had 3 teeth removed after which patient had shortness of breath and lower extremity edema.  He had been off all his medications but he went to a clinic and got refills.  Since that time he has been back on his heart meds especially Entresto and furosemide and metoprolol and swelling has greatly improved including shortness of breath but states that he has no energy and continues to have productive cough.  Last cardiology visit was March 2024.   2D echocardiogram, sleep study, and ZIO 3-day monitor were all recommended.  Review of 2D echo (10/2022) revealed that his EF is less than 20% with grade 3 diastolic dysfunction.  Years ago, the patient was offered ICD placement but had declined.  Patient was then considered for admission to the hospital for further evaluation and treatment.  Assessment and Plan:  FTT/impaired appetite.  Could be secondary to heart failure.  Urine culture with more than 100,000 colonies of Morganella.  Follow sensitivity.  Will start the patient on IV Rocephin.  Blood cultures negative in 1 day.  TSH within normal range at 1.9.  COVID and influenza was negative.   AKI/ dehydration/mild hypotension -  Patient did have 2D echocardiogram 6 months back.  Repeat 2D echocardiogram shows reduced LV function around 15%.  Initial creatinine at 3.1.  Creatinine 1 year ago was around 1.5.  Baseline likely between 1.4-1.5.  BMP today at 2.4.  Entresto, spironolactone and Lasix currently on hold.  Lactate was 1.8.  Patient initially received 2 and half liters of Ringer lactate bolus.  No further  fluids.  Will get cardiology consultation.     Severe cardiomyopathy .   History of non-ST elevation MI status post PCI 2D echocardiogram 10/2022 with last known ejection fraction of 20%.  Repeat 2D echocardiogram showed LV ejection fraction of around 15%.  BNP of 2436.  Patient has been smoking cocaine.  Counseling done.  Discussed with the patient about worsening LV ejection fraction.  He wishes to discuss with cardiology for further option.  Cardiology has been consulted for further management and assessment.  As per cardiology plan to start Coreg.  TOC for substance abuse counseling.  Mild thrombocytopenia.  No evidence of bleeding.  Will continue to monitor.  Latest platelet count of 89  Hypomagnesemia.  Improved with replacement.  Continue magnesium oxide.  Cocaine abuse.  UDS shows cocaine.  Extensive counseling was done.  Motivated to quit.    Chronic diarrhea -would benefit from GI evaluation as outpatient.   CKD stage III Creatinine of 2.9 at presentation.  Creatinine today at 2.4.  Hypertension On Entresto as outpatient.  Currently on hold.  Patient is supposed to be on Coreg as well.  Will hold off with beta-blocker for now due to cocaine on board.  Blood pressure seems to be stable at this time  Hyperlipidemia On Lipitor.  Debility deconditioning.  PT OT recommends no special therapy needs.    DVT prophylaxis: enoxaparin (LOVENOX) injection 30 mg Start: 06/14/23 2215   Code Status:     Code Status: Full Code  Disposition: Home likely in 1 to 2  days. Status is: Inpatient  Remains inpatient appropriate because:  heart failure, AKI, worsening EF, cardiology evaluation pending   Family Communication: None at bedside  Consultants:  Cardiology  Procedures:  None  Antimicrobials:  None  Anti-infectives (From admission, onward)    Start     Dose/Rate Route Frequency Ordered Stop   06/17/23 0815  cefTRIAXone (ROCEPHIN) 1 g in sodium chloride 0.9 % 100 mL IVPB         1 g 200 mL/hr over 30 Minutes Intravenous Daily 06/17/23 0759     06/14/23 2115  cefTRIAXone (ROCEPHIN) 1 g in sodium chloride 0.9 % 100 mL IVPB        1 g 200 mL/hr over 30 Minutes Intravenous  Once 06/14/23 2101 06/14/23 2201      Subjective: Today, patient was seen and examined at bedside.  Denies any chest pain, shortness of breath, dyspnea has some weakness and fatigue but better than when he came in.  Objective: Vitals:   06/16/23 1619 06/16/23 2017 06/17/23 0424 06/17/23 0700  BP: 120/82 110/80 118/73 129/80  Pulse: 73 75 76 79  Resp: 16 18 18 19   Temp: 98.2 F (36.8 C) 98 F (36.7 C) 97.9 F (36.6 C) 98.5 F (36.9 C)  TempSrc: Oral Oral Oral Oral  SpO2: 100% 100% 100%   Weight:      Height:        Intake/Output Summary (Last 24 hours) at 06/17/2023 1203 Last data filed at 06/17/2023 0843 Gross per 24 hour  Intake 240 ml  Output --  Net 240 ml   Filed Weights   06/14/23 1827 06/14/23 2313  Weight: 77.1 kg 64.6 kg    Physical Examination: Body mass index is 17.8 kg/m.   General: Thinly built, not in obvious distress alert awake and Communicative HENT:   No scleral pallor or icterus noted. Oral mucosa is moist.  Chest:  Clear breath sounds.   CVS: S1 &S2 heard. No murmur.  Regular rate and rhythm. Abdomen: Soft, nontender, nondistended.  Bowel sounds are heard.   Extremities: No cyanosis, clubbing with trace peripheral edema.  Peripheral pulses are palpable. Psych: Alert, awake and oriented, normal mood CNS:  No cranial nerve deficits.  Power equal in all extremities.   Skin: Warm and dry.  No rashes noted.  Data Reviewed:   CBC: Recent Labs  Lab 06/14/23 1912 06/14/23 2358 06/16/23 0420 06/17/23 0418  WBC 4.8 4.9 5.1 4.7  NEUTROABS 3.0  --   --   --   HGB 11.1* 11.0* 9.1* 9.0*  HCT 35.9* 36.6* 29.3* 29.1*  MCV 82.0 83.0 82.3 82.4  PLT 119* 118* 92* 89*    Basic Metabolic Panel: Recent Labs  Lab 06/14/23 1912 06/14/23 2358  06/15/23 1035 06/16/23 0420 06/17/23 0418  NA 136 137 133* 134* 131*  K 3.7 3.8 3.7 4.4 4.7  CL 95* 97* 97* 100 99  CO2 28 29 29 26 24   GLUCOSE 112* 91 93 102* 92  BUN 28* 27* 27* 31* 29*  CREATININE 3.11* 2.90* 2.77* 2.65* 2.47*  CALCIUM 7.9* 8.0* 7.5* 7.7* 8.0*  MG  --  1.1* 1.1* 2.1  --     Liver Function Tests: Recent Labs  Lab 06/14/23 1912  AST 43*  ALT 14  ALKPHOS 47  BILITOT 2.8*  PROT 7.0  ALBUMIN 2.8*     Radiology Studies: ECHOCARDIOGRAM COMPLETE  Result Date: 06/15/2023    ECHOCARDIOGRAM REPORT   Patient Name:   Joseph Tanner [facility]  Date of Exam: 06/15/2023 Medical Rec #:  [id number]               Height:       75.0 in Accession #:    [id number]              Weight:       142.4 lb Date of Birth:  1951-03-17                BSA:          1.899 m Patient Age:    72 years                BP:           110/66 mmHg Patient Gender: M                       HR:           74 bpm. Exam Location:  Inpatient Procedure: 2D Echo, 3D Echo, Cardiac Doppler, Color Doppler and Strain Analysis Indications:    CHF- Acute Systolic  History:        Patient has prior history of Echocardiogram examinations, most                 recent 11/22/2022.  Sonographer:    Karma Ganja Referring Phys: [id number] [facility] Jamicah Anstead  Sonographer Comments: Global longitudinal strain was attempted. IMPRESSIONS  1. Left ventricular ejection fraction, by estimation, is 15%. The left ventricle has severely decreased function. The left ventricle has no regional wall motion abnormalities. The left ventricular internal cavity size was moderately dilated. There is mild left ventricular hypertrophy. Left ventricular diastolic parameters are consistent with Grade III diastolic dysfunction (restrictive).  2. Right ventricular systolic function is mildly reduced. The right ventricular size is mildly enlarged. There is normal pulmonary artery systolic pressure. The estimated right ventricular systolic pressure is 30.5 mmHg.  3.  Left atrial size was severely dilated.  4. Right atrial size was severely dilated.  5. The mitral valve is grossly normal. Trivial mitral valve regurgitation. No evidence of mitral stenosis.  6. The aortic valve is tricuspid. Aortic valve regurgitation is trivial. No aortic stenosis is present.  7. The inferior vena cava is normal in size with greater than 50% respiratory variability, suggesting right atrial pressure of 3 mmHg. FINDINGS  Left Ventricle: Left ventricular ejection fraction, by estimation, is 15%. The left ventricle has severely decreased function. The left ventricle has no regional wall motion abnormalities. Global longitudinal strain performed but not reported based on interpreter judgement due to suboptimal tracking. 3D ejection fraction reviewed and evaluated as part of the interpretation. Alternate measurement of EF is felt to be most reflective of LV function. The left ventricular internal cavity size was moderately dilated. There is mild left ventricular hypertrophy. Left ventricular diastolic parameters are consistent with Grade III diastolic dysfunction (restrictive). Right Ventricle: The right ventricular size is mildly enlarged. Right vetricular wall thickness was not well visualized. Right ventricular systolic function is mildly reduced. There is normal pulmonary artery systolic pressure. The tricuspid regurgitant velocity is 2.62 m/s, and with an assumed right atrial pressure of 3 mmHg, the estimated right ventricular systolic pressure is 30.5 mmHg. Left Atrium: Left atrial size was severely dilated. Right Atrium: Right atrial size was severely dilated. Pericardium: There is no evidence of pericardial effusion. Mitral Valve: The mitral valve is grossly normal. Trivial mitral valve regurgitation. No evidence of mitral valve stenosis. Tricuspid Valve: The tricuspid  valve is normal in structure. Tricuspid valve regurgitation is trivial. No evidence of tricuspid stenosis. Aortic Valve: The  aortic valve is tricuspid. Aortic valve regurgitation is trivial. No aortic stenosis is present. Aortic valve mean gradient measures 2.0 mmHg. Aortic valve peak gradient measures 2.8 mmHg. Aortic valve area, by VTI measures 1.94 cm. Pulmonic Valve: The pulmonic valve was normal in structure. Pulmonic valve regurgitation is trivial. No evidence of pulmonic stenosis. Aorta: The aortic root is normal in size and structure. Venous: The inferior vena cava is normal in size with greater than 50% respiratory variability, suggesting right atrial pressure of 3 mmHg. IAS/Shunts: No atrial level shunt detected by color flow Doppler.  LEFT VENTRICLE PLAX 2D LVIDd:         6.50 cm   Diastology LVIDs:         6.20 cm   LV e' medial:    3.92 cm/s LV PW:         1.30 cm   LV E/e' medial:  17.8 LV IVS:        1.20 cm   LV e' lateral:   7.07 cm/s LVOT diam:     2.00 cm   LV E/e' lateral: 9.9 LV SV:         29 LV SV Index:   15 LVOT Area:     3.14 cm                           3D Volume EF:                          3D EF:        30 %                          LV EDV:       260 ml                          LV ESV:       182 ml                          LV SV:        79 ml RIGHT VENTRICLE            IVC RV Basal diam:  5.80 cm    IVC diam: 1.65 cm RV S prime:     8.27 cm/s TAPSE (M-mode): 1.5 cm LEFT ATRIUM              Index        RIGHT ATRIUM           Index LA diam:        3.60 cm  1.90 cm/m   RA Area:     29.30 cm LA Vol (A2C):   162.0 ml 85.29 ml/m  RA Volume:   112.00 ml 58.96 ml/m LA Vol (A4C):   61.3 ml  32.27 ml/m LA Biplane Vol: 97.8 ml  51.49 ml/m  AORTIC VALVE AV Area (Vmax):    2.04 cm AV Area (Vmean):   2.00 cm AV Area (VTI):     1.94 cm AV Vmax:           84.20 cm/s AV Vmean:          57.000 cm/s AV VTI:  0.149 m AV Peak Grad:      2.8 mmHg AV Mean Grad:      2.0 mmHg LVOT Vmax:         54.80 cm/s LVOT Vmean:        36.300 cm/s LVOT VTI:          0.092 m LVOT/AV VTI ratio: 0.62  AORTA Ao Root diam: 3.40  cm Ao Asc diam:  3.20 cm MITRAL VALVE               TRICUSPID VALVE MV Area (PHT): 6.37 cm    TR Peak grad:   27.5 mmHg MV Decel Time: 119 msec    TR Vmax:        262.00 cm/s MV E velocity: 69.80 cm/s MV A velocity: 25.70 cm/s  SHUNTS MV E/A ratio:  2.72        Systemic VTI:  0.09 m                            Systemic Diam: 2.00 cm Weston Brass MD Electronically signed by Weston Brass MD Signature Date/Time: 06/15/2023/3:47:29 PM    Final       LOS: 3 days     Joycelyn Das, MD [facility] Available via Epic secure chat 7am-7pm After these hours, please refer to coverage provider listed on [email or web address] 06/17/2023, 12:03 PM

## 2023-06-17 NOTE — Plan of Care (Addendum)
Patient AO X 4, VSS, ambulates with cane one assist. SR on monitor. No complaints of pain. New on carvedilol per cardiology. IV antibiotics.    Problem: Education: Goal: Knowledge of General Education information will improve Description: Including pain rating scale, medication(s)/side effects and non-pharmacologic comfort measures Outcome: Progressing   Problem: Health Behavior/Discharge Planning: Goal: Ability to manage health-related needs will improve Outcome: Progressing   Problem: Clinical Measurements: Goal: Ability to maintain clinical measurements within normal limits will improve Outcome: Progressing Goal: Will remain free from infection Outcome: Progressing Goal: Diagnostic test results will improve Outcome: Progressing Goal: Respiratory complications will improve Outcome: Progressing Goal: Cardiovascular complication will be avoided Outcome: Progressing   Problem: Activity: Goal: Risk for activity intolerance will decrease Outcome: Progressing   Problem: Nutrition: Goal: Adequate nutrition will be maintained Outcome: Progressing   Problem: Coping: Goal: Level of anxiety will decrease Outcome: Progressing   Problem: Elimination: Goal: Will not experience complications related to bowel motility Outcome: Progressing Goal: Will not experience complications related to urinary retention Outcome: Progressing   Problem: Pain Management: Goal: General experience of comfort will improve Outcome: Progressing   Problem: Safety: Goal: Ability to remain free from injury will improve Outcome: Progressing   Problem: Skin Integrity: Goal: Risk for impaired skin integrity will decrease Outcome: Progressing   Problem: Education: Goal: Ability to demonstrate management of disease process will improve Outcome: Progressing Goal: Ability to verbalize understanding of medication therapies will improve Outcome: Progressing Goal: Individualized Educational  Video(s) Outcome: Progressing   Problem: Activity: Goal: Capacity to carry out activities will improve Outcome: Progressing   Problem: Cardiac: Goal: Ability to achieve and maintain adequate cardiopulmonary perfusion will improve Outcome: Progressing

## 2023-06-17 NOTE — Plan of Care (Signed)
  Problem: Education: Goal: Knowledge of General Education information will improve Description: Including pain rating scale, medication(s)/side effects and non-pharmacologic comfort measures Outcome: Progressing   Problem: Health Behavior/Discharge Planning: Goal: Ability to manage health-related needs will improve Outcome: Progressing   Problem: Activity: Goal: Risk for activity intolerance will decrease Outcome: Progressing   Problem: Coping: Goal: Level of anxiety will decrease Outcome: Progressing   Problem: Elimination: Goal: Will not experience complications related to bowel motility Outcome: Progressing Goal: Will not experience complications related to urinary retention Outcome: Progressing   Problem: Pain Management: Goal: General experience of comfort will improve Outcome: Progressing   Problem: Activity: Goal: Capacity to carry out activities will improve Outcome: Progressing

## 2023-06-17 NOTE — Progress Notes (Signed)
Heart Failure Nurse Navigator Progress Note  PCP: Pcp, No PCP-Cardiologist: Duke Salvia Admission Diagnosis: Generalized weakness, fatigue, shortness of breath, AKI, Thrombocytopenia, Hypotension.  Admitted from: Home  Presentation:   Joseph Tanner presented with shortness of breath and fatigue x 1 month, BLE edema, and generalized weakness, reports to being out of his Entresto for a few weeks, BP 92/65, HR 81, BNP 2,436, UDS + cocaine on admission, EKG with NSR, CXR with minimal bilateral lower lung interstitial opacities, cardiomegaly.   Patient was called at [facility], education done over the phone on the sign and symptoms of heart failure, daily weights, when to call his doctor or go to the ED. Diet/ fluid restrictions ( reported to eating soup and crackers multiple times a week and drinking soda) continued education on taking all medications as prescribed and attending all medical appointments. Patient verbalized concern about transportation to appointments, he stated he doesn't drive, and sometimes he can get a neighbor to help. Navigator reached out to HF CSW and she was able to get a Taxi arranged for patient for his follow up visit to HF TOC. Patient verbalized his understanding of all education, a HF TOC appointment was scheduled for 06/24/2023 @ 2 pm.   ECHO/ LVEF: 15% G3DD  Clinical Course:  Past Medical History:  Diagnosis Date   COPD (chronic obstructive pulmonary disease) (HCC)    Hyperlipemia    Hypertension    NSTEMI (non-ST elevated myocardial infarction) (HCC)    Pure hypercholesterolemia 03/15/2021     Social History   Socioeconomic History   Marital status: Divorced    Spouse name: Not on file   Number of children: 5   Years of education: Not on file   Highest education level: Associate degree: academic program  Occupational History   Occupation: Retired  Tobacco Use   Smoking status: Some Days    Types: Cigarettes   Smokeless tobacco: Never   Vaping Use   Vaping status: Never Used  Substance and Sexual Activity   Alcohol use: Not Currently   Drug use: Yes    Types: Cocaine    Comment: 6 months   Sexual activity: Not on file  Other Topics Concern   Not on file  Social History Narrative   Not on file   Social Determinants of Health   Financial Resource Strain: Low Risk  (06/17/2023)   Overall Financial Resource Strain (CARDIA)    Difficulty of Paying Living Expenses: Not very hard  Food Insecurity: No Food Insecurity (06/14/2023)   Hunger Vital Sign    Worried About Running Out of Food in the Last Year: Never true    Ran Out of Food in the Last Year: Never true  Transportation Needs: Unmet Transportation Needs (06/17/2023)   PRAPARE - Administrator, Civil Service (Medical): Yes    Lack of Transportation (Non-Medical): Yes  Physical Activity: Not on file  Stress: Not on file  Social Connections: Not on file   Education Assessment and Provision:  Detailed education and instructions provided on heart failure disease management including the following:  Signs and symptoms of Heart Failure When to call the physician Importance of daily weights Low sodium diet Fluid restriction Medication management Anticipated future follow-up appointments  Patient education given on each of the above topics.  Patient acknowledges understanding via teach back method and acceptance of all instructions.  Education Materials:  "Living Better With Heart Failure" Booklet, HF zone tool, & Daily Weight Tracker Tool.  Patient has  scale at home: Yes Patient has pill box at home: NA    High Risk Criteria for Readmission and/or Poor Patient Outcomes: Heart failure hospital admissions (last 6 months): 0  No Show rate: 40% Difficult social situation: No, lives alone, has transportation needs..  Demonstrates medication adherence: No Primary Language: English Literacy level: Reading, writing and comprehension  Barriers of  Care:   Medication and appointment compliance Diet/ fluid restrictions ( salt, soups, soda's)  Daily weights Transportation needs for appointments  Considerations/Referrals:   Referral made to Heart Failure Pharmacist Stewardship: No Referral made to Heart Failure CSW/NCM TOC: Yes, Transportation needs, substance cessation Referral made to Heart & Vascular TOC clinic: Yes, 06/24/2023 @ 2 pm.   Items for Follow-up on DC/TOC: Medication and appointment compliance Diet/ fluid restrictions/ daily weights Continued HF education PLEASE give a HF Book ( from [employer])   Rhae Hammock, Scientist, research (physical sciences), RN Heart Failure Teacher, adult education Only

## 2023-06-17 NOTE — Telephone Encounter (Signed)
H&V Care Navigation CSW Progress Note  Clinical Social Worker contacted patient by phone to assist with transportation to clinic appointment.  Patient is participating in a Managed Medicaid Plan:  No  CSW spoke with patient to arrange taxi transport to clinic on 06-14-23. Patient verbalizes understanding and waiver reviewed. Lasandra Beech, LCSW, CCSW-MCS [phone]   SDOH Screenings   Food Insecurity: No Food Insecurity (06/14/2023)  Housing: Low Risk  (06/17/2023)  Transportation Needs: Unmet Transportation Needs (06/17/2023)  Utilities: Not At Risk (06/14/2023)  Alcohol Screen: Low Risk  (06/17/2023)  Financial Resource Strain: Low Risk  (06/17/2023)  Tobacco Use: High Risk (06/14/2023)   06/17/2023  Joseph Tanner DOB: 1951-03-30 MRN: [id number]   RIDER WAIVER AND RELEASE OF LIABILITY  For the purposes of helping with transportation needs, [facility] partners with outside transportation providers (taxi companies, [place], Catering manager.) to give [name] or other approved people the choice of on-demand rides [employer]") to our buildings for non-emergency visits.  By using [employer], I, the person signing this document, on behalf of myself and/or any legal minors (in my care using the [employer]), agree:  Science writer given to me are supplied by independent, outside transportation providers who do not work for, or have any affiliation with, [employer]. [place] is not a transportation company. [facility] has no control over the quality or safety of the rides I get using [employer]. [place] has no control over whether any outside ride will happen on time or not. [place] gives no guarantee on the reliability, quality, safety, or availability on any rides, or that no mistakes will happen. I know and accept that traveling by vehicle (car, truck, SVU, Zenaida Niece, bus, taxi, etc.) has risks of serious injuries such as disability,  being paralyzed, and death. I know and agree the risk of using [employer] is mine alone, and not [employer]. Transport Services are provided "as is" and as are available. The transportation providers are in charge for all inspections and care of the vehicles used to provide these rides. I agree not to take legal action against [place], its agents, employees, officers, directors, representatives, insurers, attorneys, assigns, successors, subsidiaries, and affiliates at any time for any reasons related directly or indirectly to using [employer]. I also agree not to take legal action against [place] or its affiliates for any injury, death, or damage to property caused by or related to using [employer]. I have read this Waiver and Release of Liability, and I understand the terms used in it and their legal meaning. This Waiver is freely and voluntarily given with the understanding that my right (or any legal minors) to legal action against  relating to [employer] is knowingly given up to use these services.   I attest that I read the Ride Waiver and Release of Liability to [name], gave [name] the opportunity to ask questions and answered the questions asked (if any). I affirm that Joseph Holster Ealy then provided consent for assistance with transportation.     [name]

## 2023-06-17 NOTE — Progress Notes (Signed)
Notified HF TOC team to schedule visit in a week or so per Dr. Duke Salvia. Rhae Hammock will call into patient's room today to discuss scheduling options. She will update AVS with this info. I also scheduled subsequent gen cards f/u Friday Jul 11, 2023  in [facility] location, outlined on AVS.

## 2023-06-18 DIAGNOSIS — N179 Acute kidney failure, unspecified: Secondary | ICD-10-CM | POA: Diagnosis not present

## 2023-06-18 LAB — CBC
HCT: 28.8 % — ABNORMAL LOW (ref 39.0–52.0)
Hemoglobin: 8.7 g/dL — ABNORMAL LOW (ref 13.0–17.0)
MCH: 25.5 pg — ABNORMAL LOW (ref 26.0–34.0)
MCHC: 30.2 g/dL (ref 30.0–36.0)
MCV: 84.5 fL (ref 80.0–100.0)
Platelets: 83 10*3/uL — ABNORMAL LOW (ref 150–400)
RBC: 3.41 MIL/uL — ABNORMAL LOW (ref 4.22–5.81)
RDW: 22.3 % — ABNORMAL HIGH (ref 11.5–15.5)
WBC: 4.2 10*3/uL (ref 4.0–10.5)
nRBC: 0 % (ref 0.0–0.2)

## 2023-06-18 LAB — BASIC METABOLIC PANEL
Anion gap: 6 (ref 5–15)
BUN: 32 mg/dL — ABNORMAL HIGH (ref 8–23)
CO2: 26 mmol/L (ref 22–32)
Calcium: 8.4 mg/dL — ABNORMAL LOW (ref 8.9–10.3)
Chloride: 104 mmol/L (ref 98–111)
Creatinine, Ser: 2.34 mg/dL — ABNORMAL HIGH (ref 0.61–1.24)
GFR, Estimated: 29 mL/min — ABNORMAL LOW (ref >60)
Glucose, Bld: 103 mg/dL — ABNORMAL HIGH (ref 70–99)
Potassium: 4.4 mmol/L (ref 3.5–5.1)
Sodium: 136 mmol/L (ref 135–145)

## 2023-06-18 LAB — MAGNESIUM: Magnesium: 2 mg/dL (ref 1.7–2.4)

## 2023-06-18 MED ORDER — ENSURE ENLIVE PO LIQD
237.0000 mL | Freq: Two times a day (BID) | ORAL | Status: DC
  Administered 2023-06-18 (×2): 237 mL via ORAL

## 2023-06-18 MED ORDER — PNEUMOCOCCAL 20-VAL CONJ VACC 0.5 ML IM SUSY
0.5000 mL | PREFILLED_SYRINGE | INTRAMUSCULAR | Status: AC
  Administered 2023-06-19: 0.5 mL via INTRAMUSCULAR
  Filled 2023-06-18: qty 0.5

## 2023-06-18 MED ORDER — AMOXICILLIN 500 MG PO CAPS: 500.0000 mg | ORAL_CAPSULE | Freq: Two times a day (BID) | ORAL | Status: DC

## 2023-06-18 MED ORDER — INFLUENZA VAC A&B SURF ANT ADJ 0.5 ML IM SUSY
0.5000 mL | PREFILLED_SYRINGE | INTRAMUSCULAR | Status: AC
  Administered 2023-06-19: 0.5 mL via INTRAMUSCULAR
  Filled 2023-06-18: qty 0.5

## 2023-06-18 MED ORDER — AMOXICILLIN 500 MG PO CAPS
500.0000 mg | ORAL_CAPSULE | Freq: Two times a day (BID) | ORAL | Status: DC
  Administered 2023-06-18 – 2023-06-19 (×3): 500 mg via ORAL
  Filled 2023-06-18 (×3): qty 1

## 2023-06-18 NOTE — Plan of Care (Addendum)
Patient ambulated around unit today with cane. Reports improved breathing, less fatigue. PO antibiotics. Patient reported strong smoking urge today. Possible DC tomorrow.   Problem: Education: Goal: Knowledge of General Education information will improve Description: Including pain rating scale, medication(s)/side effects and non-pharmacologic comfort measures Outcome: Progressing   Problem: Health Behavior/Discharge Planning: Goal: Ability to manage health-related needs will improve Outcome: Progressing   Problem: Clinical Measurements: Goal: Ability to maintain clinical measurements within normal limits will improve Outcome: Progressing Goal: Will remain free from infection Outcome: Progressing Goal: Diagnostic test results will improve Outcome: Progressing Goal: Respiratory complications will improve Outcome: Progressing Goal: Cardiovascular complication will be avoided Outcome: Progressing   Problem: Activity: Goal: Risk for activity intolerance will decrease Outcome: Progressing   Problem: Nutrition: Goal: Adequate nutrition will be maintained Outcome: Progressing   Problem: Coping: Goal: Level of anxiety will decrease Outcome: Progressing   Problem: Elimination: Goal: Will not experience complications related to bowel motility Outcome: Progressing Goal: Will not experience complications related to urinary retention Outcome: Progressing   Problem: Pain Management: Goal: General experience of comfort will improve Outcome: Progressing   Problem: Safety: Goal: Ability to remain free from injury will improve Outcome: Progressing   Problem: Skin Integrity: Goal: Risk for impaired skin integrity will decrease Outcome: Progressing   Problem: Education: Goal: Ability to demonstrate management of disease process will improve Outcome: Progressing Goal: Ability to verbalize understanding of medication therapies will improve Outcome: Progressing Goal:  Individualized Educational Video(s) Outcome: Progressing   Problem: Activity: Goal: Capacity to carry out activities will improve Outcome: Progressing   Problem: Cardiac: Goal: Ability to achieve and maintain adequate cardiopulmonary perfusion will improve Outcome: Progressing

## 2023-06-18 NOTE — Plan of Care (Signed)

## 2023-06-18 NOTE — Progress Notes (Signed)
PHARMACY NOTE:  ANTIMICROBIAL RENAL DOSAGE ADJUSTMENT  Current antimicrobial regimen includes a mismatch between antimicrobial dosage and estimated renal function.  As per policy approved by the Pharmacy & Therapeutics and Medical Executive Committees, the antimicrobial dosage will be adjusted accordingly.  Current antimicrobial dosage:  amoxicillin 500 mg PO TID  Indication: Enterococcus faecalis UTI  Renal Function:  Estimated Creatinine Clearance: 26.1 mL/min (A) (by C-G formula based on SCr of 2.34 mg/dL (H)). []      On intermittent HD, scheduled: []      On CRRT    Antimicrobial dosage has been changed to:  amoxicillin 500 mg PO q12h  Cindi Carbon, PharmD 06/18/23 1:36 PM

## 2023-06-18 NOTE — Progress Notes (Signed)
PROGRESS NOTE    Joseph Tanner  VHQ:[id number] DOB: 1950-08-16 DOA: 06/14/2023 PCP: Pcp, No   Brief Narrative: Joseph Tanner is a 72 y.o. male with a history of NSTEMI s/p PCI, systolic heart failure, CKD stage III, hypertension, hyperlipidemia, alcohol abuse.  Patient presented secondary to fatigue and weakness with evidence of failure to thrive and AKI with associated hypotension. Patient managed with IV fluids. Cardiology consulted for worsened cardiomyopathy. Antibiotics started for evidence of UTI.   Assessment and Plan:  Failure to thrive Noted. Concern for possible cardiac cachexia, but patient reports poor overall appetite. No PCP at this time. No functional limitation to eating, per patient and patient history. -Encourage nutritional intake  AKI Creatinine of 3.11 on admission. Concern for possible cardiorenal vs progressive renal disease but presentation is complicated by hypotension which may have led to ATN. Patient's renal function has improved with IV fluids initially given on admission. Patient does not have nephrology follow-up. -BMP in AM -Outpatient nephrology referral  Hypotension Resolved with IV fluids.  Severe cardiomyopathy HFrEF CAD s/p history of PCI LVEF of 15%. No regional wall motion abnormalities. Cardiology consulted. No acute exacerbation. Consideration of ICD but will revisit as an outpatient.  Thrombocytopenia Chronic. Trending down slightly.  UTI Urine culture significant for morganella morganii and E. Faecalis infections. Associated dysuria. Patient was started empirically on Ceftriaxone IV. -Continue Ceftriaxone IV -Add Amoxicillin -Follow-up final urine culture data  Hypomagnesemia Resolved with magnesium supplementation.  Cocaine abuse Noted.  CKD stage IIIa Baseline creatinine of about 1.5 from 08/2021. AKI noted on admission.  Primary hypertension -Continue Coreg; Entresto held  Hyperlipidemia -Continue  Lipitor  Underweight Estimated body mass index is 17.8 kg/m as calculated from the following:   Height as of this encounter: 6\' 3"  (1.905 m).   Weight as of this encounter: 64.6 kg. -Consult dietitian   DVT prophylaxis: Lovenox Code Status:   Code Status: Full Code Family Communication: None at bedside Disposition Plan: Discharge home in AM pending continued improvement in renal function, in addition to transition to outpatient antibiotic regimen   Consultants:  Cardiology  Procedures:  Transthoracic Echocardiogram  Antimicrobials: Ceftriaxone Amoxicillin    Subjective: Patient reports no concerns this morning or from overnight.  Objective: BP 112/77 (BP Location: Right Arm)   Pulse 75   Temp 98.2 F (36.8 C) (Oral)   Resp 17   Ht 6\' 3"  (1.905 m)   Wt 64.6 kg   SpO2 99%   BMI 17.80 kg/m   Examination:  General exam: Appears calm and comfortable Respiratory system: Clear to auscultation. Respiratory effort normal. Cardiovascular system: S1 & S2 heard, RRR. No murmurs. Gastrointestinal system: Abdomen is nondistended, soft and nontender. Normal bowel sounds heard. Central nervous system: Alert and oriented. No focal neurological deficits. Musculoskeletal: No edema. No calf tenderness Skin: No cyanosis. No rashes Psychiatry: Judgement and insight appear normal. Mood & affect appropriate.    Data Reviewed: I have personally reviewed following labs and imaging studies  CBC Lab Results  Component Value Date   WBC 4.2 06/18/2023   RBC 3.41 (L) 06/18/2023   HGB 8.7 (L) 06/18/2023   HCT 28.8 (L) 06/18/2023   MCV 84.5 06/18/2023   MCH 25.5 (L) 06/18/2023   PLT 83 (L) 06/18/2023   MCHC 30.2 06/18/2023   RDW 22.3 (H) 06/18/2023   LYMPHSABS 1.1 06/14/2023   MONOABS 0.7 06/14/2023   EOSABS 0.0 06/14/2023   BASOSABS 0.0 06/14/2023     Last metabolic panel Lab  Results  Component Value Date   NA 136 06/18/2023   K 4.4 06/18/2023   CL 104 06/18/2023    CO2 26 06/18/2023   BUN 32 (H) 06/18/2023   CREATININE 2.34 (H) 06/18/2023   GLUCOSE 103 (H) 06/18/2023   GFRNONAA 29 (L) 06/18/2023   CALCIUM 8.4 (L) 06/18/2023   PROT 7.0 06/14/2023   ALBUMIN 2.8 (L) 06/14/2023   LABGLOB 3.3 08/29/2021   AGRATIO 1.2 08/29/2021   BILITOT 2.8 (H) 06/14/2023   ALKPHOS 47 06/14/2023   AST 43 (H) 06/14/2023   ALT 14 06/14/2023   ANIONGAP 6 06/18/2023    GFR: Estimated Creatinine Clearance: 26.1 mL/min (A) (by C-G formula based on SCr of 2.34 mg/dL (H)).  Recent Results (from the past 240 hour(s))  Blood Culture (routine x 2)     Status: None (Preliminary result)   Collection Time: 06/14/23  7:30 PM   Specimen: Left Antecubital; Blood  Result Value Ref Range Status   Specimen Description   Final    LEFT ANTECUBITAL Performed at [facility], [address], [place], [place] [address]    Special Requests   Final    BOTTLES DRAWN AEROBIC AND ANAEROBIC Blood Culture adequate volume Performed at [facility], [address] [place], [place] [address]    Culture   Final    NO GROWTH 3 DAYS Performed at [facility], [address] [place], [place] [address]    Report Status PENDING  Incomplete  Blood Culture (routine x 2)     Status: None (Preliminary result)   Collection Time: 06/14/23  7:35 PM   Specimen: Right Antecubital; Blood  Result Value Ref Range Status   Specimen Description   Final    RIGHT ANTECUBITAL Performed at [facility], [address], [place], [place] [address]    Special Requests   Final    BOTTLES DRAWN AEROBIC AND ANAEROBIC Blood Culture adequate volume Performed at [facility], [address], [place], [place] [address]    Culture   Final    NO GROWTH 3 DAYS Performed at [facility], [address], [place], [place] [address]    Report Status PENDING  Incomplete  Urine Culture     Status: Abnormal (Preliminary result)    Collection Time: 06/14/23  7:52 PM   Specimen: Urine, Clean Catch  Result Value Ref Range Status   Specimen Description   Final    URINE, CLEAN CATCH Performed at [facility], [address], [place], [place] [address]    Special Requests   Final    NONE Performed at [facility], [address] [place], [place] [address]    Culture (A)  Final    >=100,000 COLONIES/mL MORGANELLA MORGANII 40,000 COLONIES/mL ENTEROCOCCUS FAECALIS REPEATING SUSCEPTIBILITIES ON MORGANELLA MORGANII WITHIN MIXED CULTURE Performed at [facility], [address] [place], [place] [address]    Report Status PENDING  Incomplete   Organism ID, Bacteria ENTEROCOCCUS FAECALIS (A)  Final      Susceptibility   Enterococcus faecalis - MIC*    AMPICILLIN <=2 SENSITIVE Sensitive     NITROFURANTOIN <=16 SENSITIVE Sensitive     VANCOMYCIN 1 SENSITIVE Sensitive     * 40,000 COLONIES/mL ENTEROCOCCUS FAECALIS  Resp panel by RT-PCR (RSV, Flu A&B, Covid) Anterior Nasal Swab     Status: None   Collection Time: 06/14/23  8:26 PM   Specimen: Anterior  Nasal Swab  Result Value Ref Range Status   SARS Coronavirus 2 by RT PCR NEGATIVE NEGATIVE Final    Comment: (NOTE) SARS-CoV-2 target nucleic acids are NOT DETECTED.  The SARS-CoV-2 RNA is generally detectable in upper respiratory specimens during the acute phase of infection. The lowest concentration of SARS-CoV-2 viral copies this assay can detect is 138 copies/mL. A negative result does not preclude SARS-Cov-2 infection and should not be used as the sole basis for treatment or other patient management decisions. A negative result may occur with  improper specimen collection/handling, submission of specimen other than nasopharyngeal swab, presence of viral mutation(s) within the areas targeted by this assay, and inadequate number of viral copies(<138 copies/mL). A negative result must be combined with clinical  observations, patient history, and epidemiological information. The expected result is Negative.  Fact Sheet for Patients:  BloggerCourse.com  Fact Sheet for Healthcare Providers:  SeriousBroker.it  This test is no t yet approved or cleared by the [place] FDA and  has been authorized for detection and/or diagnosis of SARS-CoV-2 by FDA under an Emergency Use Authorization (EUA). This EUA will remain  in effect (meaning this test can be used) for the duration of the COVID-19 declaration under Section 564(b)(1) of the Act, 21 U.S.C.section 360bbb-3(b)(1), unless the authorization is terminated  or revoked sooner.       Influenza A by PCR NEGATIVE NEGATIVE Final   Influenza B by PCR NEGATIVE NEGATIVE Final    Comment: (NOTE) The Xpert Xpress SARS-CoV-2/FLU/RSV plus assay is intended as an aid in the diagnosis of influenza from Nasopharyngeal swab specimens and should not be used as a sole basis for treatment. Nasal washings and aspirates are unacceptable for Xpert Xpress SARS-CoV-2/FLU/RSV testing.  Fact Sheet for Patients: BloggerCourse.com  Fact Sheet for Healthcare Providers: SeriousBroker.it  This test is not yet approved or cleared by the [place] FDA and has been authorized for detection and/or diagnosis of SARS-CoV-2 by FDA under an Emergency Use Authorization (EUA). This EUA will remain in effect (meaning this test can be used) for the duration of the COVID-19 declaration under Section 564(b)(1) of the Act, 21 U.S.C. section 360bbb-3(b)(1), unless the authorization is terminated or revoked.     Resp Syncytial Virus by PCR NEGATIVE NEGATIVE Final    Comment: (NOTE) Fact Sheet for Patients: BloggerCourse.com  Fact Sheet for Healthcare Providers: SeriousBroker.it  This test is not yet approved or cleared by  the [place] FDA and has been authorized for detection and/or diagnosis of SARS-CoV-2 by FDA under an Emergency Use Authorization (EUA). This EUA will remain in effect (meaning this test can be used) for the duration of the COVID-19 declaration under Section 564(b)(1) of the Act, 21 U.S.C. section 360bbb-3(b)(1), unless the authorization is terminated or revoked.  Performed at [facility], [address] [place], [place] [address]       Radiology Studies: No results found.    LOS: 4 days    Jacquelin Hawking, MD [facility] 06/18/2023, 1:15 PM   If 7PM-7AM, please contact night-coverage [email or web address]

## 2023-06-18 NOTE — Progress Notes (Signed)
Progress Note  Patient Name: Joseph Tanner Date of Encounter: 06/18/2023  Primary Cardiologist: [name], MD  Subjective   No chest pain or SOB. Breathing is excellent. Main concern is loss of appetite and progressive weight loss. He's down about 13lb from weight earlier this year.   IM is adjusting abx today based on culture results. They anticipate 1 more day here.  Inpatient Medications    Scheduled Meds:  aspirin EC  81 mg Oral Daily   atorvastatin  40 mg Oral QPM   carvedilol  3.125 mg Oral BID WC   enoxaparin (LOVENOX) injection  30 mg Subcutaneous Q24H   feeding supplement  237 mL Oral BID BM   folic acid  1 mg Oral Daily   magnesium oxide  400 mg Oral BID   multivitamin with minerals  1 tablet Oral Daily   thiamine  100 mg Oral Daily   traZODone  50 mg Oral QHS   Continuous Infusions:  cefTRIAXone (ROCEPHIN)  IV Stopped (06/18/23 1052)   PRN Meds: acetaminophen **OR** acetaminophen, ipratropium-albuterol, ondansetron **OR** ondansetron (ZOFRAN) IV   Vital Signs    Vitals:   06/17/23 0700 06/17/23 1400 06/18/23 0000 06/18/23 0509  BP: 129/80 132/67 111/73 112/77  Pulse: 79 74 80 75  Resp: 19 13 16 17   Temp: 98.5 F (36.9 C) 97.9 F (36.6 C) 98.5 F (36.9 C) 98.2 F (36.8 C)  TempSrc: Oral Oral Oral Oral  SpO2:   100% 99%  Weight:      Height:        Intake/Output Summary (Last 24 hours) at 06/18/2023 1159 Last data filed at 06/18/2023 1100 Gross per 24 hour  Intake 698 ml  Output --  Net 698 ml      06/14/2023   11:13 PM 06/14/2023    6:27 PM 10/24/2022   10:44 AM  Last 3 Weights  Weight (lbs) 142 lb 6.7 oz 170 lb 155 lb 12.8 oz  Weight (kg) 64.6 kg 77.111 kg 70.67 kg     Telemetry    NSR, 6 beats NSVT x1 - Personally Reviewed  Physical Exam   GEN: No acute distress.  HEENT: Normocephalic, atraumatic, sclera non-icteric. Neck: No JVD or bruits. Cardiac: RRR no murmurs, rubs, or gallops.  Respiratory: Clear to  auscultation bilaterally. Breathing is unlabored. GI: Soft, nontender, non-distended, BS +x 4. MS: no deformity. Extremities: No clubbing or cyanosis. No edema. Distal pedal pulses are 2+ and equal bilaterally. Neuro:  AAOx3. Follows commands. Psych:  Responds to questions appropriately with a normal affect.  Labs    High Sensitivity Troponin:   Recent Labs  Lab 06/14/23 1912  TROPONINIHS 60*      Cardiac EnzymesNo results for input(s): "TROPONINI" in the last 168 hours. No results for input(s): "TROPIPOC" in the last 168 hours.   Chemistry Recent Labs  Lab 06/14/23 1912 06/14/23 2358 06/16/23 0420 06/17/23 0418 06/18/23 0421  NA 136   < > 134* 131* 136  K 3.7   < > 4.4 4.7 4.4  CL 95*   < > 100 99 104  CO2 28   < > 26 24 26   GLUCOSE 112*   < > 102* 92 103*  BUN 28*   < > 31* 29* 32*  CREATININE 3.11*   < > 2.65* 2.47* 2.34*  CALCIUM 7.9*   < > 7.7* 8.0* 8.4*  PROT 7.0  --   --   --   --   ALBUMIN 2.8*  --   --   --   --  AST 43*  --   --   --   --   ALT 14  --   --   --   --   ALKPHOS 47  --   --   --   --   BILITOT 2.8*  --   --   --   --   GFRNONAA 20*   < > 25* 27* 29*  ANIONGAP 13   < > 8 8 6    < > = values in this interval not displayed.     Hematology Recent Labs  Lab 06/16/23 0420 06/17/23 0418 06/18/23 0421  WBC 5.1 4.7 4.2  RBC 3.56* 3.53* 3.41*  HGB 9.1* 9.0* 8.7*  HCT 29.3* 29.1* 28.8*  MCV 82.3 82.4 84.5  MCH 25.6* 25.5* 25.5*  MCHC 31.1 30.9 30.2  RDW 22.2* 22.1* 22.3*  PLT 92* 89* 83*    BNP Recent Labs  Lab 06/14/23 2358  BNP 2,436.6*     DDimer No results for input(s): "DDIMER" in the last 168 hours.   Radiology    No results found.  Cardiac Studies   2d echo 06/15/23   1. Left ventricular ejection fraction, by estimation, is 15%. The left  ventricle has severely decreased function. The left ventricle has no  regional wall motion abnormalities. The left ventricular internal cavity  size was moderately dilated. There  is  mild left ventricular hypertrophy. Left ventricular diastolic parameters  are consistent with Grade III diastolic dysfunction (restrictive).   2. Right ventricular systolic function is mildly reduced. The right  ventricular size is mildly enlarged. There is normal pulmonary artery  systolic pressure. The estimated right ventricular systolic pressure is  30.5 mmHg.   3. Left atrial size was severely dilated.   4. Right atrial size was severely dilated.   5. The mitral valve is grossly normal. Trivial mitral valve  regurgitation. No evidence of mitral stenosis.   6. The aortic valve is tricuspid. Aortic valve regurgitation is trivial.  No aortic stenosis is present.   7. The inferior vena cava is normal in size with greater than 50%  respiratory variability, suggesting right atrial pressure of 3 mmHg.   FINDINGS   Patient Profile     72 y.o. male with chronic HFrEF, CAD (outside hospital ~09/2020 with NSTEMI/CAD out of proportion to his cardiomyopathy including severe stenosis of a small circumflex artery and mild to moderate stenosis/heavy calcification of the large right coronary artery, managed medically), HTN, HLD, CKD stage 3a, prior anemia/thrombocytopenia on labs, ETOH abuse, cocaine abuse, episodic nonadherence with testing/medications who presented back to the hospital with failure to thrive, impaired appetite, UTI, AKI, chronic diarrhea, continued anemia/thrombocytopenia. Cardiology seeing for CHF EF ~15%.  Assessment & Plan    1. Chronic HFrEF complicated by AKI on CKD 3a -  prior Cr 2023 was ~1.5, admitted at 3.11, suspect cardiorenal superimposed on progressive renal disease in the setting of nonadherence. Improved to 2.34 - CXR with B/L interstitial opacities and BNP 2,436 but volume status actually looks good today - did not receive diuresis in setting of AKI; carvedilol added back with plans to revisit progression of GDMT as OP as renal function improves - Dr. Duke Salvia  discussed importance of considering ICD; would revisit in clinic. Not felt to be a candidate for LVAD at this time  2. Weight loss, FTT, also with UTI - per primary team - TSH wnl - cannot exclude component of cardiac cachexia  3. Substance abuse with cocaine, prior ETOH  use - cessation advised - Child psychotherapist also assisting with social needs (thank you HF TOC team)  4. HTN - BP stable  5. CAD, HLD - no chest pain - continue medical therapy - on ASA, need to watch CBC - continue atorvastatin, BB  6. Anemia, thrombocytopenia - continued mild decline, no bleeding reported - per primary team - may consider OP GI eval to exclude cirrhosis given these findings and cardiac/ETOH hx  TOC HF f/u arranged 06/24/23 2pm Gen cards f/u arranged 07/11/23 with APP  For questions or updates, please contact [facility] Please consult [email or web address] for contact info under Cardiology/STEMI.  Signed, Laurann Montana, PA-C 06/18/2023, 11:59 AM

## 2023-06-19 ENCOUNTER — Encounter: Payer: Self-pay | Admitting: Family Medicine

## 2023-06-19 ENCOUNTER — Other Ambulatory Visit (HOSPITAL_COMMUNITY): Payer: Self-pay

## 2023-06-19 ENCOUNTER — Other Ambulatory Visit: Payer: Self-pay | Admitting: Family Medicine

## 2023-06-19 DIAGNOSIS — N179 Acute kidney failure, unspecified: Secondary | ICD-10-CM | POA: Diagnosis not present

## 2023-06-19 LAB — BASIC METABOLIC PANEL
Anion gap: 7 (ref 5–15)
BUN: 40 mg/dL — ABNORMAL HIGH (ref 8–23)
CO2: 25 mmol/L (ref 22–32)
Calcium: 8.5 mg/dL — ABNORMAL LOW (ref 8.9–10.3)
Chloride: 101 mmol/L (ref 98–111)
Creatinine, Ser: 2.21 mg/dL — ABNORMAL HIGH (ref 0.61–1.24)
GFR, Estimated: 31 mL/min — ABNORMAL LOW (ref >60)
Glucose, Bld: 90 mg/dL (ref 70–99)
Potassium: 4.7 mmol/L (ref 3.5–5.1)
Sodium: 133 mmol/L — ABNORMAL LOW (ref 135–145)

## 2023-06-19 LAB — CBC
HCT: 29.4 % — ABNORMAL LOW (ref 39.0–52.0)
Hemoglobin: 8.8 g/dL — ABNORMAL LOW (ref 13.0–17.0)
MCH: 25.9 pg — ABNORMAL LOW (ref 26.0–34.0)
MCHC: 29.9 g/dL — ABNORMAL LOW (ref 30.0–36.0)
MCV: 86.5 fL (ref 80.0–100.0)
Platelets: 83 10*3/uL — ABNORMAL LOW (ref 150–400)
RBC: 3.4 MIL/uL — ABNORMAL LOW (ref 4.22–5.81)
RDW: 22 % — ABNORMAL HIGH (ref 11.5–15.5)
WBC: 4.1 10*3/uL (ref 4.0–10.5)
nRBC: 0 % (ref 0.0–0.2)

## 2023-06-19 MED ORDER — FOLIC ACID 1 MG PO TABS
1.0000 mg | ORAL_TABLET | Freq: Every day | ORAL | 2 refills | Status: DC
  Filled 2023-06-19: qty 30, 30d supply, fill #0

## 2023-06-19 MED ORDER — LEVOFLOXACIN 500 MG PO TABS
250.0000 mg | ORAL_TABLET | Freq: Every day | ORAL | 0 refills | Status: DC
  Filled 2023-06-19: qty 3, 6d supply, fill #0

## 2023-06-19 MED ORDER — LEVOFLOXACIN 250 MG PO TABS: 250.0000 mg | ORAL_TABLET | Freq: Every day | ORAL | 0 refills | Status: AC

## 2023-06-19 MED ORDER — FOLIC ACID 1 MG PO TABS: 1.0000 mg | ORAL_TABLET | Freq: Every day | ORAL | 2 refills | Status: AC

## 2023-06-19 MED ORDER — ENSURE ENLIVE PO LIQD: 237.0000 mL | Freq: Two times a day (BID) | ORAL | Status: DC

## 2023-06-19 MED ORDER — VITAMIN B-1 100 MG PO TABS
100.0000 mg | ORAL_TABLET | Freq: Every day | ORAL | 2 refills | Status: DC
  Filled 2023-06-19: qty 30, 30d supply, fill #0

## 2023-06-19 MED ORDER — VITAMIN B-1 100 MG PO TABS: 100.0000 mg | ORAL_TABLET | Freq: Every day | ORAL | 2 refills | Status: AC

## 2023-06-19 NOTE — Discharge Summary (Signed)
Physician Discharge Summary   Patient: Joseph Tanner MRN: [id number] DOB: 1951/07/02  Admit date:     06/14/2023  Discharge date: 06/19/23  Discharge Physician: Jacquelin Hawking, MD   PCP: Pcp, No   Recommendations at discharge:  Establish PCP care Cardiology follow-up Establish with nephrology Referral placed to hematology for follow-up of chronic thrombocytopenia and anemia  Discharge Diagnoses: Principal Problem:   Acute kidney injury [facility]) Active Problems:   Chronic combined systolic and diastolic heart failure (HCC)   CKD (chronic kidney disease) stage 3, GFR 30-59 ml/min (HCC)   Protein-calorie malnutrition, severe   Hypotension   Cocaine abuse (HCC)   Acute on chronic combined systolic and diastolic CHF (congestive heart failure) (HCC)  Resolved Problems:   * No resolved hospital problems. [facility] Course: Joseph Tanner is a 72 y.o. male with a history of NSTEMI s/p PCI, systolic heart failure, CKD stage III, hypertension, hyperlipidemia, alcohol abuse.  Patient presented secondary to fatigue and weakness with evidence of failure to thrive and AKI with associated hypotension. Patient managed with IV fluids. Cardiology consulted for worsened cardiomyopathy. Antibiotics started for evidence of UTI.  Assessment and Plan:  Failure to thrive Noted. Concern for possible cardiac cachexia, but patient reports poor overall appetite. No PCP at this time. No functional limitation to eating, per patient and patient history. Close follow-up with PCP.   AKI Creatinine of 3.11 on admission. Concern for possible cardiorenal vs progressive renal disease but presentation is complicated by hypotension which may have led to ATN. Patient's renal function has improved with IV fluids initially given on admission. Patient does not have nephrology follow-up. Information given to patient to call outpatient nephrology; could not refer from inpatient. May need to have PCP  referral.   Hypotension Resolved with IV fluids.   Severe cardiomyopathy HFrEF CAD s/p history of PCI LVEF of 15%. No regional wall motion abnormalities. Cardiology consulted. No acute exacerbation. Consideration of ICD but will revisit as an outpatient. Holding Entresto, furosemide and spironolactone secondary to AKI.   Thrombocytopenia Chronic. Stable. Referral to hematology/oncology.  Chronic anemia Unclear etiology. Patient believes he had a colonoscopy 6 years prior. No GI follow-up in [place]. Will refer to hematology/oncology first, but may need repeat GI follow-up eventually.   UTI Urine culture significant for morganella morganii and E. Faecalis infections. Associated dysuria. Patient was started empirically on Ceftriaxone IV with addition of amoxicillin. Patient transitioned to Levaquin on discharge to treat both bacteria. 7-day treatment course.   Hypomagnesemia Resolved with magnesium supplementation.   Cocaine abuse Noted.   CKD stage IIIa Baseline creatinine of about 1.5 from 08/2021. AKI noted on admission.   Primary hypertension Continue Coreg; Entresto held.   Hyperlipidemia Continue Lipitor.   Underweight Estimated body mass index is 17.8 kg/m as calculated from the following:   Height as of this encounter: 6\' 3"  (1.905 m).   Weight as of this encounter: 64.6 kg.   Consultants:  Cardiology   Procedures:  Transthoracic Echocardiogram  Disposition: Home Diet recommendation: Cardiac diet   DISCHARGE MEDICATION: Allergies as of 06/19/2023   No Known Allergies      Medication List     STOP taking these medications    Entresto 24-26 MG Generic drug: sacubitril-valsartan   furosemide 40 MG tablet Commonly known as: LASIX   gabapentin 100 MG capsule Commonly known as: NEURONTIN   spironolactone 25 MG tablet Commonly known as: ALDACTONE       TAKE these medications  albuterol 108 (90 Base) MCG/ACT inhaler Commonly known as:  VENTOLIN HFA Inhale 2 puffs into the lungs every 6 (six) hours as needed for wheezing or shortness of breath.   aspirin EC 81 MG tablet Take 1 tablet (81 mg total) by mouth daily. Swallow whole.   atorvastatin 40 MG tablet Commonly known as: LIPITOR Take 1 tablet (40 mg total) by mouth every evening.   carvedilol 3.125 MG tablet Commonly known as: COREG Take 1 tablet (3.125 mg total) by mouth 2 (two) times daily with a meal.   feeding supplement Liqd Take 237 mLs by mouth 2 (two) times daily between meals.   folic acid 1 MG tablet Commonly known as: FOLVITE Take 1 tablet (1 mg total) by mouth daily. Start taking on: June 20, 2023   levofloxacin 250 MG tablet Commonly known as: LEVAQUIN Take 1 tablet (250 mg total) by mouth daily for 5 days.   multivitamin with minerals Tabs tablet Take 1 tablet by mouth daily.   Spiriva Respimat 1.25 MCG/ACT Aers Generic drug: Tiotropium Bromide Monohydrate Inhale 2 puffs into the lungs daily. What changed:  when to take this reasons to take this   thiamine 100 MG tablet Commonly known as: Vitamin B-1 Take 1 tablet (100 mg total) by mouth daily. Start taking on: June 20, 2023        Follow-up Information     Alver Sorrow, NP Follow up.   Specialty: Cardiology Why: H[facility]to Seals - [facility] location - cardiology follow-up scheduled Friday Jul 11, 2023 at 10:05 AM. Arrive 15 minutes prior to appointment to check in. Contact information: [address] [place] [address] [phone]         [facility]. Go in 7 day(s).   Specialty: Cardiology Why: Hospital follow up 06/24/2023 @ 2 pm PLEASE bring a current medication list to appointment FREE valet parking, Entrance C, off [employer] information: [address] [place] [address] [phone]               Discharge Exam: BP 97/61 (BP Location: Right Arm)    Pulse 75   Temp 97.7 F (36.5 C) (Oral)   Resp 16   Ht 6\' 3"  (1.905 m)   Wt 64.6 kg   SpO2 100%   BMI 17.80 kg/m   General exam: Appears calm and comfortable Respiratory system: Clear to auscultation. Respiratory effort normal. Cardiovascular system: S1 & S2 heard Gastrointestinal system: Abdomen is nondistended, soft and nontender. Normal bowel sounds heard. Central nervous system: Alert and oriented. No focal neurological deficits. Musculoskeletal: No edema. No calf tenderness Psychiatry: Judgement and insight appear normal. Mood & affect appropriate.   Condition at discharge: stable  The results of significant diagnostics from this hospitalization (including imaging, microbiology, ancillary and laboratory) are listed below for reference.   Imaging Studies: ECHOCARDIOGRAM COMPLETE  Result Date: 06/15/2023    ECHOCARDIOGRAM REPORT   Patient Name:   Joseph Tanner [facility] Date of Exam: 06/15/2023 Medical Rec #:  [id number]               Height:       75.0 in Accession #:    [id number]              Weight:       142.4 lb Date of Birth:  1950-09-03                BSA:  1.899 m Patient Age:    72 years                BP:           110/66 mmHg Patient Gender: M                       HR:           74 bpm. Exam Location:  Inpatient Procedure: 2D Echo, 3D Echo, Cardiac Doppler, Color Doppler and Strain Analysis Indications:    CHF- Acute Systolic  History:        Patient has prior history of Echocardiogram examinations, most                 recent 11/22/2022.  Sonographer:    Karma Ganja Referring Phys: [id number] [facility] POKHREL  Sonographer Comments: Global longitudinal strain was attempted. IMPRESSIONS  1. Left ventricular ejection fraction, by estimation, is 15%. The left ventricle has severely decreased function. The left ventricle has no regional wall motion abnormalities. The left ventricular internal cavity size was moderately dilated. There is mild left ventricular hypertrophy. Left  ventricular diastolic parameters are consistent with Grade III diastolic dysfunction (restrictive).  2. Right ventricular systolic function is mildly reduced. The right ventricular size is mildly enlarged. There is normal pulmonary artery systolic pressure. The estimated right ventricular systolic pressure is 30.5 mmHg.  3. Left atrial size was severely dilated.  4. Right atrial size was severely dilated.  5. The mitral valve is grossly normal. Trivial mitral valve regurgitation. No evidence of mitral stenosis.  6. The aortic valve is tricuspid. Aortic valve regurgitation is trivial. No aortic stenosis is present.  7. The inferior vena cava is normal in size with greater than 50% respiratory variability, suggesting right atrial pressure of 3 mmHg. FINDINGS  Left Ventricle: Left ventricular ejection fraction, by estimation, is 15%. The left ventricle has severely decreased function. The left ventricle has no regional wall motion abnormalities. Global longitudinal strain performed but not reported based on interpreter judgement due to suboptimal tracking. 3D ejection fraction reviewed and evaluated as part of the interpretation. Alternate measurement of EF is felt to be most reflective of LV function. The left ventricular internal cavity size was moderately dilated. There is mild left ventricular hypertrophy. Left ventricular diastolic parameters are consistent with Grade III diastolic dysfunction (restrictive). Right Ventricle: The right ventricular size is mildly enlarged. Right vetricular wall thickness was not well visualized. Right ventricular systolic function is mildly reduced. There is normal pulmonary artery systolic pressure. The tricuspid regurgitant velocity is 2.62 m/s, and with an assumed right atrial pressure of 3 mmHg, the estimated right ventricular systolic pressure is 30.5 mmHg. Left Atrium: Left atrial size was severely dilated. Right Atrium: Right atrial size was severely dilated. Pericardium:  There is no evidence of pericardial effusion. Mitral Valve: The mitral valve is grossly normal. Trivial mitral valve regurgitation. No evidence of mitral valve stenosis. Tricuspid Valve: The tricuspid valve is normal in structure. Tricuspid valve regurgitation is trivial. No evidence of tricuspid stenosis. Aortic Valve: The aortic valve is tricuspid. Aortic valve regurgitation is trivial. No aortic stenosis is present. Aortic valve mean gradient measures 2.0 mmHg. Aortic valve peak gradient measures 2.8 mmHg. Aortic valve area, by VTI measures 1.94 cm. Pulmonic Valve: The pulmonic valve was normal in structure. Pulmonic valve regurgitation is trivial. No evidence of pulmonic stenosis. Aorta: The aortic root is normal in size and structure. Venous: The inferior vena cava is  normal in size with greater than 50% respiratory variability, suggesting right atrial pressure of 3 mmHg. IAS/Shunts: No atrial level shunt detected by color flow Doppler.  LEFT VENTRICLE PLAX 2D LVIDd:         6.50 cm   Diastology LVIDs:         6.20 cm   LV e' medial:    3.92 cm/s LV PW:         1.30 cm   LV E/e' medial:  17.8 LV IVS:        1.20 cm   LV e' lateral:   7.07 cm/s LVOT diam:     2.00 cm   LV E/e' lateral: 9.9 LV SV:         29 LV SV Index:   15 LVOT Area:     3.14 cm                           3D Volume EF:                          3D EF:        30 %                          LV EDV:       260 ml                          LV ESV:       182 ml                          LV SV:        79 ml RIGHT VENTRICLE            IVC RV Basal diam:  5.80 cm    IVC diam: 1.65 cm RV S prime:     8.27 cm/s TAPSE (M-mode): 1.5 cm LEFT ATRIUM              Index        RIGHT ATRIUM           Index LA diam:        3.60 cm  1.90 cm/m   RA Area:     29.30 cm LA Vol (A2C):   162.0 ml 85.29 ml/m  RA Volume:   112.00 ml 58.96 ml/m LA Vol (A4C):   61.3 ml  32.27 ml/m LA Biplane Vol: 97.8 ml  51.49 ml/m  AORTIC VALVE AV Area (Vmax):    2.04 cm AV Area  (Vmean):   2.00 cm AV Area (VTI):     1.94 cm AV Vmax:           84.20 cm/s AV Vmean:          57.000 cm/s AV VTI:            0.149 m AV Peak Grad:      2.8 mmHg AV Mean Grad:      2.0 mmHg LVOT Vmax:         54.80 cm/s LVOT Vmean:        36.300 cm/s LVOT VTI:          0.092 m LVOT/AV VTI ratio: 0.62  AORTA Ao Root diam: 3.40 cm Ao Asc diam:  3.20 cm MITRAL VALVE  TRICUSPID VALVE MV Area (PHT): 6.37 cm    TR Peak grad:   27.5 mmHg MV Decel Time: 119 msec    TR Vmax:        262.00 cm/s MV E velocity: 69.80 cm/s MV A velocity: 25.70 cm/s  SHUNTS MV E/A ratio:  2.72        Systemic VTI:  0.09 m                            Systemic Diam: 2.00 cm Weston Brass MD Electronically signed by Weston Brass MD Signature Date/Time: 06/15/2023/3:47:29 PM    Final    DG Chest Port 1 View  Result Date: 06/14/2023 CLINICAL DATA:  One-month history of fatigue and shortness of breath EXAM: PORTABLE CHEST 1 VIEW COMPARISON:  Chest radiograph dated 02/21/2021 FINDINGS: Normal lung volumes. Minimal bilateral lower lung interstitial opacities. No pleural effusion or pneumothorax. Similar enlarged cardiomediastinal silhouette. No acute osseous abnormality. IMPRESSION: 1. Minimal bilateral lower lung interstitial opacities, which may represent atelectasis, edema, or atypical infection. 2. Similar cardiomegaly. Electronically Signed   By: Agustin Cree M.D.   On: 06/14/2023 19:52    Microbiology: Results for orders placed or performed during the hospital encounter of 06/14/23  Blood Culture (routine x 2)     Status: None (Preliminary result)   Collection Time: 06/14/23  7:30 PM   Specimen: Left Antecubital; Blood  Result Value Ref Range Status   Specimen Description   Final    LEFT ANTECUBITAL Performed at [facility], [address] [place], [place] [address]    Special Requests   Final    BOTTLES DRAWN AEROBIC AND ANAEROBIC Blood Culture adequate volume Performed at [facility], [address] [place], [place] [address]    Culture   Final    NO GROWTH 4 DAYS Performed at [facility], [address] [place], [place] [address]    Report Status PENDING  Incomplete  Blood Culture (routine x 2)     Status: None (Preliminary result)   Collection Time: 06/14/23  7:35 PM   Specimen: Right Antecubital; Blood  Result Value Ref Range Status   Specimen Description   Final    RIGHT ANTECUBITAL Performed at [facility], [address] [place], [place] [address]    Special Requests   Final    BOTTLES DRAWN AEROBIC AND ANAEROBIC Blood Culture adequate volume Performed at [facility], [address] [place], [place] [address]    Culture   Final    NO GROWTH 4 DAYS Performed at [facility], [address] [place], [place] [address]    Report Status PENDING  Incomplete  Urine Culture     Status: Abnormal (Preliminary result)   Collection Time: 06/14/23  7:52 PM   Specimen: Urine, Clean Catch  Result Value Ref Range Status   Specimen Description   Final    URINE, CLEAN CATCH Performed at [facility], [address] [place], [place] [address]    Special Requests   Final    NONE Performed at [facility], [address], [place], [place] [address]    Culture (A)  Final    >=100,000 COLONIES/mL MORGANELLA MORGANII 40,000 COLONIES/mL ENTEROCOCCUS FAECALIS REPEATING SUSCEPTIBILITIES ON MORGANELLA MORGANII WITHIN MIXED CULTURE Performed at [facility], [address], [place], [place] [address]    Report Status  PENDING  Incomplete   Organism ID, Bacteria ENTEROCOCCUS FAECALIS (A)  Final      Susceptibility   Enterococcus faecalis - MIC*    AMPICILLIN <=2 SENSITIVE Sensitive     NITROFURANTOIN <=16 SENSITIVE Sensitive     VANCOMYCIN 1 SENSITIVE Sensitive     * 40,000 COLONIES/mL ENTEROCOCCUS FAECALIS  Resp panel by RT-PCR (RSV, Flu A&B,  Covid) Anterior Nasal Swab     Status: None   Collection Time: 06/14/23  8:26 PM   Specimen: Anterior Nasal Swab  Result Value Ref Range Status   SARS Coronavirus 2 by RT PCR NEGATIVE NEGATIVE Final    Comment: (NOTE) SARS-CoV-2 target nucleic acids are NOT DETECTED.  The SARS-CoV-2 RNA is generally detectable in upper respiratory specimens during the acute phase of infection. The lowest concentration of SARS-CoV-2 viral copies this assay can detect is 138 copies/mL. A negative result does not preclude SARS-Cov-2 infection and should not be used as the sole basis for treatment or other patient management decisions. A negative result may occur with  improper specimen collection/handling, submission of specimen other than nasopharyngeal swab, presence of viral mutation(s) within the areas targeted by this assay, and inadequate number of viral copies(<138 copies/mL). A negative result must be combined with clinical observations, patient history, and epidemiological information. The expected result is Negative.  Fact Sheet for Patients:  BloggerCourse.com  Fact Sheet for Healthcare Providers:  SeriousBroker.it  This test is no t yet approved or cleared by the [place] FDA and  has been authorized for detection and/or diagnosis of SARS-CoV-2 by FDA under an Emergency Use Authorization (EUA). This EUA will remain  in effect (meaning this test can be used) for the duration of the COVID-19 declaration under Section 564(b)(1) of the Act, 21 U.S.C.section 360bbb-3(b)(1), unless the authorization is terminated  or revoked sooner.       Influenza A by PCR NEGATIVE NEGATIVE Final   Influenza B by PCR NEGATIVE NEGATIVE Final    Comment: (NOTE) The Xpert Xpress SARS-CoV-2/FLU/RSV plus assay is intended as an aid in the diagnosis of influenza from Nasopharyngeal swab specimens and should not be used as a sole basis for treatment. Nasal  washings and aspirates are unacceptable for Xpert Xpress SARS-CoV-2/FLU/RSV testing.  Fact Sheet for Patients: BloggerCourse.com  Fact Sheet for Healthcare Providers: SeriousBroker.it  This test is not yet approved or cleared by the [place] FDA and has been authorized for detection and/or diagnosis of SARS-CoV-2 by FDA under an Emergency Use Authorization (EUA). This EUA will remain in effect (meaning this test can be used) for the duration of the COVID-19 declaration under Section 564(b)(1) of the Act, 21 U.S.C. section 360bbb-3(b)(1), unless the authorization is terminated or revoked.     Resp Syncytial Virus by PCR NEGATIVE NEGATIVE Final    Comment: (NOTE) Fact Sheet for Patients: BloggerCourse.com  Fact Sheet for Healthcare Providers: SeriousBroker.it  This test is not yet approved or cleared by the [place] FDA and has been authorized for detection and/or diagnosis of SARS-CoV-2 by FDA under an Emergency Use Authorization (EUA). This EUA will remain in effect (meaning this test can be used) for the duration of the COVID-19 declaration under Section 564(b)(1) of the Act, 21 U.S.C. section 360bbb-3(b)(1), unless the authorization is terminated or revoked.  Performed at [facility], [address], [place], [place] [address]     Labs: CBC: Recent Labs  Lab 06/14/23 1912 06/14/23 2358 06/16/23 3244 06/17/23 0102 06/18/23 0421 06/19/23 7253  WBC 4.8 4.9 5.1 4.7 4.2 4.1  NEUTROABS 3.0  --   --   --   --   --   HGB 11.1* 11.0* 9.1* 9.0* 8.7* 8.8*  HCT 35.9* 36.6* 29.3* 29.1* 28.8* 29.4*  MCV 82.0 83.0 82.3 82.4 84.5 86.5  PLT 119* 118* 92* 89* 83* 83*   Basic Metabolic Panel: Recent Labs  Lab 06/14/23 2358 06/15/23 1035 06/16/23 0420 06/17/23 0418 06/18/23 0421 06/19/23 0429  NA 137 133* 134* 131* 136 133*  K 3.8 3.7 4.4 4.7  4.4 4.7  CL 97* 97* 100 99 104 101  CO2 29 29 26 24 26 25   GLUCOSE 91 93 102* 92 103* 90  BUN 27* 27* 31* 29* 32* 40*  CREATININE 2.90* 2.77* 2.65* 2.47* 2.34* 2.21*  CALCIUM 8.0* 7.5* 7.7* 8.0* 8.4* 8.5*  MG 1.1* 1.1* 2.1  --  2.0  --    Liver Function Tests: Recent Labs  Lab 06/14/23 1912  AST 43*  ALT 14  ALKPHOS 47  BILITOT 2.8*  PROT 7.0  ALBUMIN 2.8*    Discharge time spent: 35 minutes.  Signed: Jacquelin Hawking, MD [facility] 06/19/2023

## 2023-06-19 NOTE — Progress Notes (Signed)
Prescriptions for folic acid, thiamine and Levaquin sent to patient's [facility].

## 2023-06-19 NOTE — Progress Notes (Signed)
Initial Nutrition Assessment  DOCUMENTATION CODES:   Severe malnutrition in context of chronic illness  INTERVENTION:  - 2 gram sodium diet per MD.  - Ensure Plus High Protein po BID, each supplement provides 350 kcal and 20 grams of protein. - High-Calorie, High-Protein Nutrition Therapy diet education handout provided.  - Multivitamin with minerals daily, folic acid, and thiamine for history of etoh abuse.  - Monitor weight trends.   NUTRITION DIAGNOSIS:   Severe Malnutrition related to chronic illness as evidenced by severe fat depletion, severe muscle depletion.  GOAL:   Patient will meet greater than or equal to 90% of their needs  MONITOR:   PO intake, Supplement acceptance, Weight trends  REASON FOR ASSESSMENT:   Consult Assessment of nutrition requirement/status  ASSESSMENT:   72 y.o. male with a history of NSTEMI s/p PCI, systolic heart failure, CKD stage III, HTN, HLD, alcohol abuse. Presented secondary to fatigue and weakness with evidence of failure to thrive and AKI with associated hypotension.  Patient reports a UBW of 168#-178# and weight loss over the past 1 year as a result of having his teeth pulled and having trouble keeping his intake up.  Per EMR, only weight within the past year is from March. Patient weighed at 155# in March and now weighed at 142#. This is a 13# or 8% weight loss in 8 months, which is not significant for the time frame. However, patient reports more extensive weight loss than this/what is shown with documented weights.   Shares that he used to eat 3 meals a day but this has declined to only about 2 small meals a day. These mostly consist of liquids or soft/"soupy" foods . Has issues chewing with his gums but notes they are slowly hardening to be able to chew better. Appetite has also been decreased and he admits food just doesn't taste the same.   Shares he still hasn't been eating well this admission. However, he is documented to be  consuming 75-100% of meals. Has been drinking Ensure and reports liking it.   Patient to discharge home today. Discussed eating small and frequent meals and snacks throughout the day to increase intake and calories. Encouraged intake of protein supplements at home and provided patient with Ensure coupons.  Also provided High Calorie, High Protein Nutrition Therapy handout for patient to take home for tips and ideas to bulk up meals. Patient appreciative of both.    Medications reviewed and include: 1mg  folic acid, MVI, Thiamine   Labs reviewed:  Na 133 Creatinine 2.21   NUTRITION - FOCUSED PHYSICAL EXAM:  Flowsheet Row Most Recent Value  Orbital Region Moderate depletion  Upper Arm Region Severe depletion  Thoracic and Lumbar Region Severe depletion  Buccal Region Moderate depletion  Temple Region Severe depletion  Clavicle Bone Region Moderate depletion  Clavicle and Acromion Bone Region Severe depletion  Scapular Bone Region Unable to assess  Dorsal Hand Mild depletion  Patellar Region Severe depletion  Anterior Thigh Region Severe depletion  Posterior Calf Region Severe depletion  Edema (RD Assessment) None  Hair Reviewed  Eyes Reviewed  Mouth Reviewed  Skin Reviewed  Nails Reviewed       Diet Order:   Diet Order             Diet 2 gram sodium Room service appropriate? Yes; Fluid consistency: Thin  Diet effective now                   EDUCATION NEEDS:  Education needs have been addressed  Skin:  Skin Assessment: Reviewed RN Assessment  Last BM:  11/20  Height:  Ht Readings from Last 1 Encounters:  06/14/23 6\' 3"  (1.905 m)   Weight:  Wt Readings from Last 1 Encounters:  06/14/23 64.6 kg    BMI:  Body mass index is 17.8 kg/m.  Estimated Nutritional Needs:  Kcal:  1950-2150 kcals Protein:  95-115 grams Fluid:  >/= 1.9L    Shelle Iron RD, LDN For contact information, refer to [facility].

## 2023-06-19 NOTE — Progress Notes (Signed)
Discharge teaching done, pt and family voiced understanding. Discharged home in family and self care

## 2023-06-19 NOTE — Progress Notes (Signed)
Mobility Specialist - Progress Note   06/19/23 1405  Mobility  Activity Ambulated with assistance in hallway  Level of Assistance Modified independent, requires aide device or extra time  Assistive Device Cane  Distance Ambulated (ft) 350 ft  Activity Response Tolerated well  Mobility Referral Yes  $Mobility charge 1 Mobility  Mobility Specialist Start Time (ACUTE ONLY) 1345  Mobility Specialist Stop Time (ACUTE ONLY) 1356  Mobility Specialist Time Calculation (min) (ACUTE ONLY) 11 min   Pt received in bed and agreeable to mobility. No complaints during session. Pt to bed after session with all needs met.    [facility]

## 2023-06-19 NOTE — Discharge Instructions (Signed)
Joseph Tanner,  You were in the hospital secondary to kidney impairment and found to have a urinary infection in addition to poor heart function (heart failure). Your medications have been adjusted because of your kidney function. I have sent a referral for a nephrologist (kidney doctor). You also have some low platelets. This appears to be chronic, but it is stable. I have referred you to a hematologist (blood disease doctor).

## 2023-06-20 ENCOUNTER — Other Ambulatory Visit (HOSPITAL_COMMUNITY): Payer: Self-pay

## 2023-06-20 LAB — CULTURE, BLOOD (ROUTINE X 2)
Culture: NO GROWTH
Culture: NO GROWTH
Special Requests: ADEQUATE
Special Requests: ADEQUATE

## 2023-06-24 ENCOUNTER — Encounter (HOSPITAL_COMMUNITY): Payer: Self-pay

## 2023-06-24 ENCOUNTER — Ambulatory Visit (HOSPITAL_COMMUNITY)
Admit: 2023-06-24 | Discharge: 2023-06-24 | Disposition: A | Discharge status: Home / Self Care | Payer: Medicare HMO | Source: Ambulatory Visit | Attending: Physician Assistant

## 2023-06-24 VITALS — BP 130/90 | HR 90 | Ht 75.0 in | Wt 147.8 lb

## 2023-06-24 DIAGNOSIS — I2510 Atherosclerotic heart disease of native coronary artery without angina pectoris: Secondary | ICD-10-CM | POA: Diagnosis not present

## 2023-06-24 DIAGNOSIS — N184 Chronic kidney disease, stage 4 (severe): Secondary | ICD-10-CM | POA: Diagnosis not present

## 2023-06-24 DIAGNOSIS — Z720 Tobacco use: Secondary | ICD-10-CM

## 2023-06-24 DIAGNOSIS — I130 Hypertensive heart and chronic kidney disease with heart failure and stage 1 through stage 4 chronic kidney disease, or unspecified chronic kidney disease: Secondary | ICD-10-CM | POA: Insufficient documentation

## 2023-06-24 DIAGNOSIS — F17210 Nicotine dependence, cigarettes, uncomplicated: Secondary | ICD-10-CM | POA: Diagnosis not present

## 2023-06-24 DIAGNOSIS — Z79899 Other long term (current) drug therapy: Secondary | ICD-10-CM | POA: Diagnosis not present

## 2023-06-24 DIAGNOSIS — I428 Other cardiomyopathies: Secondary | ICD-10-CM | POA: Insufficient documentation

## 2023-06-24 DIAGNOSIS — E785 Hyperlipidemia, unspecified: Secondary | ICD-10-CM | POA: Insufficient documentation

## 2023-06-24 DIAGNOSIS — I5022 Chronic systolic (congestive) heart failure: Secondary | ICD-10-CM | POA: Insufficient documentation

## 2023-06-24 DIAGNOSIS — I5020 Unspecified systolic (congestive) heart failure: Secondary | ICD-10-CM | POA: Diagnosis not present

## 2023-06-24 DIAGNOSIS — Z7982 Long term (current) use of aspirin: Secondary | ICD-10-CM | POA: Insufficient documentation

## 2023-06-24 DIAGNOSIS — J449 Chronic obstructive pulmonary disease, unspecified: Secondary | ICD-10-CM | POA: Insufficient documentation

## 2023-06-24 LAB — CBC
HCT: 29.9 % — ABNORMAL LOW (ref 39.0–52.0)
Hemoglobin: 8.9 g/dL — ABNORMAL LOW (ref 13.0–17.0)
MCH: 25.2 pg — ABNORMAL LOW (ref 26.0–34.0)
MCHC: 29.8 g/dL — ABNORMAL LOW (ref 30.0–36.0)
MCV: 84.7 fL (ref 80.0–100.0)
Platelets: 131 10*3/uL — ABNORMAL LOW (ref 150–400)
RBC: 3.53 MIL/uL — ABNORMAL LOW (ref 4.22–5.81)
RDW: 23.3 % — ABNORMAL HIGH (ref 11.5–15.5)
WBC: 5 10*3/uL (ref 4.0–10.5)
nRBC: 0 % (ref 0.0–0.2)

## 2023-06-24 LAB — COMPREHENSIVE METABOLIC PANEL
ALT: 18 U/L (ref 0–44)
AST: 39 U/L (ref 15–41)
Albumin: 3 g/dL — ABNORMAL LOW (ref 3.5–5.0)
Alkaline Phosphatase: 37 U/L — ABNORMAL LOW (ref 38–126)
Anion gap: 8 (ref 5–15)
BUN: 28 mg/dL — ABNORMAL HIGH (ref 8–23)
CO2: 21 mmol/L — ABNORMAL LOW (ref 22–32)
Calcium: 9 mg/dL (ref 8.9–10.3)
Chloride: 108 mmol/L (ref 98–111)
Creatinine, Ser: 2.34 mg/dL — ABNORMAL HIGH (ref 0.61–1.24)
GFR, Estimated: 29 mL/min — ABNORMAL LOW (ref >60)
Glucose, Bld: 95 mg/dL (ref 70–99)
Potassium: 4.5 mmol/L (ref 3.5–5.1)
Sodium: 137 mmol/L (ref 135–145)
Total Bilirubin: 2.4 mg/dL — ABNORMAL HIGH (ref <1.2)
Total Protein: 7.5 g/dL (ref 6.5–8.1)

## 2023-06-24 LAB — BRAIN NATRIURETIC PEPTIDE: B Natriuretic Peptide: >4500 pg/mL — ABNORMAL HIGH (ref 0.0–100.0)

## 2023-06-24 NOTE — Progress Notes (Signed)
HEART & VASCULAR TRANSITION OF CARE CONSULT NOTE     Referring Physician: Dr. Mal Misty Primary Care: N/A Primary Cardiologist: Dr. Duke Salvia  HPI: Referred to clinic by Dr. Caleb Popp with [facility] for heart failure consultation. 72 y.o. male with history of HFrEF, CAD, COPD, HTN, HLD, hx cocaine abuse, tobacco use, recurrent UTI.   Diagnosed with HF in 2019 while living in [place]. In 03/22 he was admitted with decompensated HF. LHC with severe stenosis small Lcx and mild moderate stenosis RCA. CAD treated medically. Echo with LVEF < 25%. ICD had been offered in the past but he declined.  EF had been in 25-30% range, most recently < 20% on echo 04/24.  He was admitted for acute on chronic CHF 06/14/23. Reports he had several teeth removed a few weeks prior and was eating lots of saltine crackers and soup afterwards. Had also run out of several HF medications for a few days before they were refilled. Echo with EF < 15%, mildly reduced RV. He was also treated for UTI and AKI.  UDS + for cocaine.   Here today for hospital follow-up. Has been very fatigued with gradual decline in functional capacity over the last 6 months. Has lost 20 lbs over the last 1.5 years. Has decreased appetite. No clear dyspnea, orthopnea, PND or lower extremity edema. He misunderstood discharge instructions and stopped all medications except folic acid, thiamine and levaquin.   Reports rare ETOH and states he last used cocaine a year ago. Smokes cigarettes occasionally.   Past Medical History:  Diagnosis Date   COPD (chronic obstructive pulmonary disease) (HCC)    Hyperlipemia    Hypertension    NSTEMI (non-ST elevated myocardial infarction) (HCC)    Pure hypercholesterolemia 03/15/2021    Current Outpatient Medications  Medication Sig Dispense Refill   folic acid (FOLVITE) 1 MG tablet Take 1 tablet (1 mg total) by mouth daily. 30 tablet 2   levofloxacin (LEVAQUIN) 250 MG tablet Take 1 tablet (250 mg total) by  mouth daily for 5 days. 5 tablet 0   thiamine (VITAMIN B-1) 100 MG tablet Take 1 tablet (100 mg total) by mouth daily. 30 tablet 2   albuterol (VENTOLIN HFA) 108 (90 Base) MCG/ACT inhaler Inhale 2 puffs into the lungs every 6 (six) hours as needed for wheezing or shortness of breath. (Patient not taking: Reported on 06/24/2023)     aspirin EC 81 MG EC tablet Take 1 tablet (81 mg total) by mouth daily. Swallow whole. (Patient not taking: Reported on 06/14/2023)     atorvastatin (LIPITOR) 40 MG tablet Take 1 tablet (40 mg total) by mouth every evening. (Patient not taking: Reported on 06/24/2023) 90 tablet 2   carvedilol (COREG) 3.125 MG tablet Take 1 tablet (3.125 mg total) by mouth 2 (two) times daily with a meal. (Patient not taking: Reported on 06/24/2023) 180 tablet 2   feeding supplement (ENSURE ENLIVE / ENSURE PLUS) LIQD Take 237 mLs by mouth 2 (two) times daily between meals. (Patient not taking: Reported on 06/24/2023)     Multiple Vitamin (MULTIVITAMIN WITH MINERALS) TABS tablet Take 1 tablet by mouth daily. (Patient not taking: Reported on 06/14/2023)     Tiotropium Bromide Monohydrate (SPIRIVA RESPIMAT) 1.25 MCG/ACT AERS Inhale 2 puffs into the lungs daily. (Patient not taking: Reported on 06/24/2023) 4 g 1   No current facility-administered medications for this encounter.    No Known Allergies    Social History   Socioeconomic History   Marital status: Divorced  Spouse name: Not on file   Number of children: 5   Years of education: Not on file   Highest education level: Associate degree: academic program  Occupational History   Occupation: Retired  Tobacco Use   Smoking status: Some Days    Types: Cigarettes   Smokeless tobacco: Never  Vaping Use   Vaping status: Never Used  Substance and Sexual Activity   Alcohol use: Not Currently   Drug use: Yes    Types: Cocaine    Comment: 6 months   Sexual activity: Not on file  Other Topics Concern   Not on file  Social  History Narrative   Not on file   Social Determinants of Health   Financial Resource Strain: Low Risk  (06/17/2023)   Overall Financial Resource Strain (CARDIA)    Difficulty of Paying Living Expenses: Not very hard  Food Insecurity: No Food Insecurity (06/14/2023)   Hunger Vital Sign    Worried About Running Out of Food in the Last Year: Never true    Ran Out of Food in the Last Year: Never true  Transportation Needs: Unmet Transportation Needs (06/17/2023)   PRAPARE - Administrator, Civil Service (Medical): Yes    Lack of Transportation (Non-Medical): Yes  Physical Activity: Not on file  Stress: Not on file  Social Connections: Not on file  Intimate Partner Violence: Not At Risk (06/14/2023)   Humiliation, Afraid, Rape, and Kick questionnaire    Fear of Current or Ex-Partner: No    Emotionally Abused: No    Physically Abused: No    Sexually Abused: No      Family History  Problem Relation Age of Onset   Heart failure Mother    Heart failure Maternal Grandmother    Heart failure Maternal Grandfather    Heart failure Paternal Grandmother    Heart failure Paternal Grandfather     Vitals:   06/24/23 1409  BP: (!) 130/90  Pulse: 90  SpO2: 96%  Weight: 67 kg (147 lb 12.8 oz)  Height: 6\' 3"  (1.905 m)    PHYSICAL EXAM: General:  Cachectic elderly male HEENT: normal Neck: supple. no JVD.  Cor: PMI nondisplaced. Regular rate & rhythm. No rubs, gallops or murmurs. Lungs: clear Abdomen: soft, nontender, nondistended.  Extremities: no cyanosis, clubbing, rash, edema Neuro: alert & oriented x 3. moves all 4 extremities w/o difficulty. Affect pleasant.  ECG: SR 94 bpm, PVC   ASSESSMENT & PLAN: HFrEF -Primarily NICM -LHC 2022 with severe stenosis small Lcx and mild moderate stenosis RCA. CAD treated medically. -Echo 2022 with LVEF < 25% -Echo 11/24 with EF < 15%, mildly reduced RV -Has declined ICD placement in the past -NYHA IIIb. Worry that he is  nearing end-stage. ? Cardiac cachexia.  -GDMT recently held d/t AKI/hypotension -Remain off coreg (never restarted at discharge even thought it is on med list) as I am concerned for low-output -No SGLT2i with frequent UTIs -Check CMET/BNP, if renal function improving will add back Entresto. Otherwise add bidil. -If not making progress with attempts to add back GDMT may need to consider GOC discussions -Abstain from cocaine. Denies recent use, but UDS + during admit  CAD -LHC in 2022 as above -Restart aspirin 81 mg and atorvastatin 40 -No angina  CKD IV -Recent AKI with Scr up to 3.4, settled out in 2s -Cr had been around 1.5 in 2023 -? Progressive cardiorenal syndrome -Labs today  Tobacco use -Cessation discussed   Referred to HFSW (PCP,  Medications, Transportation, ETOH Abuse, Drug Abuse, Insurance, Financial ): No, HF CSW already assisting with transportation. Arrived with taxi today. Refer to Pharmacy: No Refer to Home Health: No Refer to Advanced Heart Failure Clinic: no,  Refer to General Cardiology: No, already established  Follow up  Keep follow-up with Cardiology 12/13 as scheduled, will see back in TOC in 4-6 weeks

## 2023-06-24 NOTE — Patient Instructions (Signed)
Re-start Aspirin 81 mg daily. Re-start atorvastatin 40 mg daily. Labs today - will call you if abnormal. Return to Heart Failure TOC Clinic in 6 weeks - see below. Please call us at [phone] if any issues or questions before your next visit.

## 2023-06-25 ENCOUNTER — Telehealth (HOSPITAL_COMMUNITY): Payer: Self-pay

## 2023-06-25 ENCOUNTER — Other Ambulatory Visit (HOSPITAL_COMMUNITY): Payer: Self-pay

## 2023-06-25 MED ORDER — ISOSORB DINITRATE-HYDRALAZINE 20-37.5 MG PO TABS: 0.5000 | ORAL_TABLET | Freq: Three times a day (TID) | ORAL | 3 refills | Status: DC

## 2023-06-25 NOTE — Telephone Encounter (Addendum)
Pt aware, agreeable, and verbalized understanding  Labs ordered and med list updated and Rx sent   ----- Message from [facility] N sent at 06/24/2023  6:26 PM EST ----- Kidney function still reduced. Add bidil 1/2 tab TID. If expensive can do hydralazine 12.5 TID + imdur 15 mg daily. BNP very high. Would have him take lasix 20 mg three times a week. Repeat CMET in 1 week.

## 2023-06-26 LAB — URINE CULTURE: Culture: >=100000 — AB

## 2023-06-26 LAB — SUSCEPTIBILITY RESULT

## 2023-06-26 LAB — SUSCEPTIBILITY, AER + ANAEROB: Source of Sample: 8680

## 2023-07-02 ENCOUNTER — Other Ambulatory Visit (HOSPITAL_COMMUNITY): Payer: Medicare HMO

## 2023-07-09 ENCOUNTER — Ambulatory Visit (HOSPITAL_COMMUNITY): Payer: Medicare HMO

## 2023-07-11 ENCOUNTER — Ambulatory Visit (HOSPITAL_BASED_OUTPATIENT_CLINIC_OR_DEPARTMENT_OTHER): Payer: Medicare HMO | Admitting: Family

## 2023-07-18 ENCOUNTER — Other Ambulatory Visit: Payer: Self-pay | Admitting: Internal Medicine

## 2023-07-18 DIAGNOSIS — D539 Nutritional anemia, unspecified: Secondary | ICD-10-CM

## 2023-07-19 ENCOUNTER — Inpatient Hospital Stay: Payer: Medicare HMO

## 2023-07-19 ENCOUNTER — Inpatient Hospital Stay: Payer: Medicare HMO | Attending: Internal Medicine | Admitting: Internal Medicine

## 2023-07-19 NOTE — Progress Notes (Deleted)
[facility] [phone]   [phone]  CONSULT NOTE  REFERRING PHYSICIAN:  REASON FOR CONSULTATION:  ***  HPI Joseph Tanner is a 72 y.o. male.  *** HPI  Past Medical History:  Diagnosis Date   COPD (chronic obstructive pulmonary disease) (HCC)    Hyperlipemia    Hypertension    NSTEMI (non-ST elevated myocardial infarction) (HCC)    Pure hypercholesterolemia 03/15/2021    No past surgical history on file.  Family History  Problem Relation Age of Onset   Heart failure Mother    Heart failure Maternal Grandmother    Heart failure Maternal Grandfather    Heart failure Paternal Grandmother    Heart failure Paternal Grandfather     Social History Social History   Tobacco Use   Smoking status: Some Days    Types: Cigarettes   Smokeless tobacco: Never  Vaping Use   Vaping status: Never Used  Substance Use Topics   Alcohol use: Not Currently   Drug use: Yes    Types: Cocaine    Comment: 6 months    No Known Allergies  Current Outpatient Medications  Medication Sig Dispense Refill   albuterol (VENTOLIN HFA) 108 (90 Base) MCG/ACT inhaler Inhale 2 puffs into the lungs every 6 (six) hours as needed for wheezing or shortness of breath. (Patient not taking: Reported on 06/24/2023)     aspirin EC 81 MG EC tablet Take 1 tablet (81 mg total) by mouth daily. Swallow whole. (Patient not taking: Reported on 06/14/2023)     atorvastatin (LIPITOR) 40 MG tablet Take 1 tablet (40 mg total) by mouth every evening. (Patient not taking: Reported on 06/24/2023) 90 tablet 2   feeding supplement (ENSURE ENLIVE / ENSURE PLUS) LIQD Take 237 mLs by mouth 2 (two) times daily between meals. (Patient not taking: Reported on 06/24/2023)     folic acid (FOLVITE) 1 MG tablet Take 1 tablet (1 mg total) by mouth daily. 30 tablet 2   isosorbide-hydrALAZINE (BIDIL) 20-37.5 MG tablet Take 0.5 tablets by mouth 3 (three) times daily. 90 tablet 3    Multiple Vitamin (MULTIVITAMIN WITH MINERALS) TABS tablet Take 1 tablet by mouth daily. (Patient not taking: Reported on 06/14/2023)     thiamine (VITAMIN B-1) 100 MG tablet Take 1 tablet (100 mg total) by mouth daily. 30 tablet 2   Tiotropium Bromide Monohydrate (SPIRIVA RESPIMAT) 1.25 MCG/ACT AERS Inhale 2 puffs into the lungs daily. (Patient not taking: Reported on 06/24/2023) 4 g 1   No current facility-administered medications for this visit.    Review of Systems  {Ros - complete:30496}  Physical Exam  RAL:{CHL ONC PE GENERAL:[phone]} SKIN: {CHL ONC PE KGUR:[id number]} HEAD: {CHL ONC PE GBTD:1761607371} EYES: {CHL ONC PE GGYI:9485462703} EARS: {CHL ONC PE JKKX:3818299371} OROPHARYNX:{CHL ONC PE OROPHARYNX:[phone]}  NECK: {CHL ONC PE IRCV:8938101751} LYMPH:  {CHL ONC PE WCHEN:2778242353} BREAST:{CHL ONC PE [phone]} LUNGS: {CHL ONC PE IRWER:1540086761} HEART: {CHL ONC PE PJKDT:2671245809} ABDOMEN:{CHL ONC PE ABDOMEN:[phone]} BACK: {CHL ONC PE XIPJ:8250539767} EXTREMITIES:{CHL ONC PE [phone]}  NEURO: {CHL ONC PE NEURO:[phone]}  PERFORMANCE STATUS: ECOG ***  LABORATORY DATA: Lab Results  Component Value Date   WBC 5.0 06/24/2023   HGB 8.9 (L) 06/24/2023   HCT 29.9 (L) 06/24/2023   MCV 84.7 06/24/2023   PLT 131 (L) 06/24/2023      Chemistry      Component Value Date/Time   NA 137 06/24/2023 1511   NA 140 08/29/2021 1421   K 4.5 06/24/2023  1511   CL 108 06/24/2023 1511   CO2 21 (L) 06/24/2023 1511   BUN 28 (H) 06/24/2023 1511   BUN 15 08/29/2021 1421   CREATININE 2.34 (H) 06/24/2023 1511      Component Value Date/Time   CALCIUM 9.0 06/24/2023 1511   ALKPHOS 37 (L) 06/24/2023 1511   AST 39 06/24/2023 1511   ALT 18 06/24/2023 1511   BILITOT 2.4 (H) 06/24/2023 1511   BILITOT 0.7 08/29/2021 1421       RADIOGRAPHIC STUDIES: No results found.  ASSESSMENT:   PLAN:  The patient voices understanding of current disease  status and treatment options and is in agreement with the current care plan.  All questions were answered. The patient knows to call the clinic with any problems, questions or concerns. We can certainly see the patient much sooner if necessary.  Thank you so much for allowing me to participate in the care of [facility]. I will continue to follow up the patient with you and assist in his care.  I spent {CHL ONC TIME VISIT - UVOZD:[id number]} counseling the patient face to face. The total time spent in the appointment was {CHL ONC TIME VISIT - VZDGL:[id number]}.  Disclaimer: This note was dictated with voice recognition software. Similar sounding words can inadvertently be transcribed and may not be corrected upon review.   Joseph Tanner July 19, 2023, 8:25 AM

## 2023-08-05 ENCOUNTER — Ambulatory Visit (HOSPITAL_COMMUNITY): Payer: Medicare HMO

## 2023-10-13 ENCOUNTER — Telehealth: Payer: Self-pay

## 2023-10-13 NOTE — Telephone Encounter (Signed)
**Note De-Identified Joseph Tanner Obfuscation** Per the [employer] provider portal: Approved Dates of service 12/08/2023 - 02/06/2024 Authorization #[id number]  Tracking #[id number]  I have transferred the Split Night Sleep Study order to thye sleep lab so they can contact the pt to schedule test.

## 2024-02-10 ENCOUNTER — Inpatient Hospital Stay (HOSPITAL_COMMUNITY)
Admission: EM | Admit: 2024-02-10 | Discharge: 2024-02-20 | DRG: 291 | Disposition: A | Discharge status: Skilled Nursing Facility | Attending: Student | Admitting: Student

## 2024-02-10 ENCOUNTER — Emergency Department (HOSPITAL_COMMUNITY)

## 2024-02-10 ENCOUNTER — Encounter (HOSPITAL_COMMUNITY): Payer: Self-pay | Admitting: Internal Medicine

## 2024-02-10 ENCOUNTER — Other Ambulatory Visit: Payer: Self-pay

## 2024-02-10 DIAGNOSIS — R570 Cardiogenic shock: Secondary | ICD-10-CM | POA: Diagnosis present

## 2024-02-10 DIAGNOSIS — R7989 Other specified abnormal findings of blood chemistry: Secondary | ICD-10-CM | POA: Diagnosis present

## 2024-02-10 DIAGNOSIS — I2510 Atherosclerotic heart disease of native coronary artery without angina pectoris: Secondary | ICD-10-CM | POA: Diagnosis present

## 2024-02-10 DIAGNOSIS — D61818 Other pancytopenia: Secondary | ICD-10-CM | POA: Diagnosis present

## 2024-02-10 DIAGNOSIS — I5084 End stage heart failure: Secondary | ICD-10-CM | POA: Diagnosis present

## 2024-02-10 DIAGNOSIS — I447 Left bundle-branch block, unspecified: Secondary | ICD-10-CM | POA: Diagnosis present

## 2024-02-10 DIAGNOSIS — Z8249 Family history of ischemic heart disease and other diseases of the circulatory system: Secondary | ICD-10-CM

## 2024-02-10 DIAGNOSIS — N179 Acute kidney failure, unspecified: Secondary | ICD-10-CM | POA: Diagnosis present

## 2024-02-10 DIAGNOSIS — F17210 Nicotine dependence, cigarettes, uncomplicated: Secondary | ICD-10-CM | POA: Diagnosis present

## 2024-02-10 DIAGNOSIS — Z515 Encounter for palliative care: Secondary | ICD-10-CM

## 2024-02-10 DIAGNOSIS — E875 Hyperkalemia: Secondary | ICD-10-CM | POA: Diagnosis present

## 2024-02-10 DIAGNOSIS — Z681 Body mass index (BMI) 19 or less, adult: Secondary | ICD-10-CM

## 2024-02-10 DIAGNOSIS — Z66 Do not resuscitate: Secondary | ICD-10-CM | POA: Diagnosis present

## 2024-02-10 DIAGNOSIS — J189 Pneumonia, unspecified organism: Secondary | ICD-10-CM | POA: Diagnosis present

## 2024-02-10 DIAGNOSIS — N184 Chronic kidney disease, stage 4 (severe): Secondary | ICD-10-CM | POA: Diagnosis present

## 2024-02-10 DIAGNOSIS — I5043 Acute on chronic combined systolic (congestive) and diastolic (congestive) heart failure: Secondary | ICD-10-CM | POA: Diagnosis present

## 2024-02-10 DIAGNOSIS — N189 Chronic kidney disease, unspecified: Secondary | ICD-10-CM | POA: Diagnosis not present

## 2024-02-10 DIAGNOSIS — R0600 Dyspnea, unspecified: Principal | ICD-10-CM | POA: Diagnosis present

## 2024-02-10 DIAGNOSIS — I130 Hypertensive heart and chronic kidney disease with heart failure and stage 1 through stage 4 chronic kidney disease, or unspecified chronic kidney disease: Principal | ICD-10-CM | POA: Diagnosis present

## 2024-02-10 DIAGNOSIS — F1410 Cocaine abuse, uncomplicated: Secondary | ICD-10-CM | POA: Diagnosis present

## 2024-02-10 DIAGNOSIS — Z91148 Patient's other noncompliance with medication regimen for other reason: Secondary | ICD-10-CM

## 2024-02-10 DIAGNOSIS — Z7189 Other specified counseling: Secondary | ICD-10-CM | POA: Diagnosis not present

## 2024-02-10 DIAGNOSIS — Z79899 Other long term (current) drug therapy: Secondary | ICD-10-CM

## 2024-02-10 DIAGNOSIS — N390 Urinary tract infection, site not specified: Secondary | ICD-10-CM | POA: Diagnosis present

## 2024-02-10 DIAGNOSIS — J440 Chronic obstructive pulmonary disease with acute lower respiratory infection: Secondary | ICD-10-CM | POA: Diagnosis present

## 2024-02-10 DIAGNOSIS — E871 Hypo-osmolality and hyponatremia: Secondary | ICD-10-CM | POA: Diagnosis not present

## 2024-02-10 DIAGNOSIS — E872 Acidosis, unspecified: Secondary | ICD-10-CM | POA: Diagnosis present

## 2024-02-10 DIAGNOSIS — N3000 Acute cystitis without hematuria: Secondary | ICD-10-CM

## 2024-02-10 DIAGNOSIS — Z789 Other specified health status: Secondary | ICD-10-CM | POA: Diagnosis not present

## 2024-02-10 DIAGNOSIS — I5023 Acute on chronic systolic (congestive) heart failure: Secondary | ICD-10-CM | POA: Diagnosis present

## 2024-02-10 DIAGNOSIS — I5042 Chronic combined systolic (congestive) and diastolic (congestive) heart failure: Secondary | ICD-10-CM | POA: Diagnosis not present

## 2024-02-10 DIAGNOSIS — I428 Other cardiomyopathies: Secondary | ICD-10-CM | POA: Diagnosis present

## 2024-02-10 DIAGNOSIS — I252 Old myocardial infarction: Secondary | ICD-10-CM

## 2024-02-10 DIAGNOSIS — G4700 Insomnia, unspecified: Secondary | ICD-10-CM | POA: Diagnosis present

## 2024-02-10 DIAGNOSIS — Z9861 Coronary angioplasty status: Secondary | ICD-10-CM

## 2024-02-10 DIAGNOSIS — E43 Unspecified severe protein-calorie malnutrition: Secondary | ICD-10-CM | POA: Diagnosis present

## 2024-02-10 LAB — RAPID URINE DRUG SCREEN, HOSP PERFORMED
Amphetamines: NOT DETECTED
Barbiturates: NOT DETECTED
Benzodiazepines: NOT DETECTED
Cocaine: POSITIVE — AB
Opiates: NOT DETECTED
Tetrahydrocannabinol: NOT DETECTED

## 2024-02-10 LAB — URINALYSIS, W/ REFLEX TO CULTURE (INFECTION SUSPECTED)
Bilirubin Urine: NEGATIVE
Glucose, UA: NEGATIVE mg/dL
Ketones, ur: NEGATIVE mg/dL
Nitrite: NEGATIVE
Protein, ur: 30 mg/dL — AB
Specific Gravity, Urine: 1.014 (ref 1.005–1.030)
WBC, UA: >50 WBC/hpf (ref 0–5)
pH: 5 (ref 5.0–8.0)

## 2024-02-10 LAB — COMPREHENSIVE METABOLIC PANEL WITH GFR
ALT: 33 U/L (ref 0–44)
AST: 48 U/L — ABNORMAL HIGH (ref 15–41)
Albumin: 2.8 g/dL — ABNORMAL LOW (ref 3.5–5.0)
Alkaline Phosphatase: 57 U/L (ref 38–126)
Anion gap: 13 (ref 5–15)
BUN: 36 mg/dL — ABNORMAL HIGH (ref 8–23)
CO2: 15 mmol/L — ABNORMAL LOW (ref 22–32)
Calcium: 9 mg/dL (ref 8.9–10.3)
Chloride: 108 mmol/L (ref 98–111)
Creatinine, Ser: 2.95 mg/dL — ABNORMAL HIGH (ref 0.61–1.24)
GFR, Estimated: 22 mL/min — ABNORMAL LOW (ref >60)
Glucose, Bld: 115 mg/dL — ABNORMAL HIGH (ref 70–99)
Potassium: 5.6 mmol/L — ABNORMAL HIGH (ref 3.5–5.1)
Sodium: 136 mmol/L (ref 135–145)
Total Bilirubin: 5.8 mg/dL — ABNORMAL HIGH (ref 0.0–1.2)
Total Protein: 6.9 g/dL (ref 6.5–8.1)

## 2024-02-10 LAB — CBC WITH DIFFERENTIAL/PLATELET
Abs Immature Granulocytes: 0 K/uL (ref 0.00–0.07)
Basophils Absolute: 0 K/uL (ref 0.0–0.1)
Basophils Relative: 1 %
Eosinophils Absolute: 0 K/uL (ref 0.0–0.5)
Eosinophils Relative: 0 %
HCT: 29.5 % — ABNORMAL LOW (ref 39.0–52.0)
Hemoglobin: 8.8 g/dL — ABNORMAL LOW (ref 13.0–17.0)
Lymphocytes Relative: 7 %
Lymphs Abs: 0.3 K/uL — ABNORMAL LOW (ref 0.7–4.0)
MCH: 23.3 pg — ABNORMAL LOW (ref 26.0–34.0)
MCHC: 29.8 g/dL — ABNORMAL LOW (ref 30.0–36.0)
MCV: 78 fL — ABNORMAL LOW (ref 80.0–100.0)
Monocytes Absolute: 0.2 K/uL (ref 0.1–1.0)
Monocytes Relative: 4 %
Neutro Abs: 3.8 K/uL (ref 1.7–7.7)
Neutrophils Relative %: 88 %
Platelets: 96 K/uL — ABNORMAL LOW (ref 150–400)
RBC: 3.78 MIL/uL — ABNORMAL LOW (ref 4.22–5.81)
RDW: 23.3 % — ABNORMAL HIGH (ref 11.5–15.5)
WBC: 4.3 K/uL (ref 4.0–10.5)
nRBC: 0 % (ref 0.0–0.2)
nRBC: 0 /100{WBCs}

## 2024-02-10 LAB — LACTIC ACID, PLASMA: Lactic Acid, Venous: >9 mmol/L (ref 0.5–1.9)

## 2024-02-10 LAB — RESP PANEL BY RT-PCR (RSV, FLU A&B, COVID)  RVPGX2
Influenza A by PCR: NEGATIVE
Influenza B by PCR: NEGATIVE
Resp Syncytial Virus by PCR: NEGATIVE
SARS Coronavirus 2 by RT PCR: NEGATIVE

## 2024-02-10 LAB — PROCALCITONIN: Procalcitonin: 0.59 ng/mL

## 2024-02-10 LAB — I-STAT CG4 LACTIC ACID, ED
Lactic Acid, Venous: 5.8 mmol/L (ref 0.5–1.9)
Lactic Acid, Venous: 5.1 mmol/L (ref 0.5–1.9)

## 2024-02-10 LAB — PHOSPHORUS: Phosphorus: 2.8 mg/dL (ref 2.5–4.6)

## 2024-02-10 LAB — ETHANOL: Alcohol, Ethyl (B): <15 mg/dL (ref <15)

## 2024-02-10 LAB — TROPONIN I (HIGH SENSITIVITY): Troponin I (High Sensitivity): 85 ng/L — ABNORMAL HIGH (ref <18)

## 2024-02-10 LAB — MAGNESIUM: Magnesium: 1.6 mg/dL — ABNORMAL LOW (ref 1.7–2.4)

## 2024-02-10 LAB — BRAIN NATRIURETIC PEPTIDE: B Natriuretic Peptide: >4500 pg/mL — ABNORMAL HIGH (ref 0.0–100.0)

## 2024-02-10 MED ORDER — FUROSEMIDE 10 MG/ML IJ SOLN
40.0000 mg | Freq: Two times a day (BID) | INTRAMUSCULAR | Status: DC
  Administered 2024-02-10 – 2024-02-11 (×2): 40 mg via INTRAVENOUS
  Filled 2024-02-10 (×2): qty 4

## 2024-02-10 MED ORDER — ONDANSETRON HCL 4 MG/2ML IJ SOLN: 4.0000 mg | Freq: Four times a day (QID) | INTRAMUSCULAR | Status: DC | PRN

## 2024-02-10 MED ORDER — MELATONIN 5 MG PO TABS
5.0000 mg | ORAL_TABLET | Freq: Every evening | ORAL | Status: DC | PRN
  Administered 2024-02-10 – 2024-02-15 (×3): 5 mg via ORAL
  Filled 2024-02-10 (×4): qty 1

## 2024-02-10 MED ORDER — SODIUM CHLORIDE 0.9 % IV SOLN
2.0000 g | INTRAVENOUS | Status: DC
  Administered 2024-02-10 – 2024-02-11 (×2): 2 g via INTRAVENOUS
  Filled 2024-02-10 (×2): qty 20

## 2024-02-10 MED ORDER — SODIUM CHLORIDE 0.9 % IV SOLN: 1.0000 g | Freq: Once | INTRAVENOUS | Status: DC

## 2024-02-10 MED ORDER — ONDANSETRON HCL 4 MG PO TABS: 4.0000 mg | ORAL_TABLET | Freq: Four times a day (QID) | ORAL | Status: DC | PRN

## 2024-02-10 MED ORDER — ACETAMINOPHEN 650 MG RE SUPP
650.0000 mg | Freq: Four times a day (QID) | RECTAL | Status: DC | PRN
Start: 2024-02-10 — End: 2024-02-21

## 2024-02-10 MED ORDER — ACETAMINOPHEN 325 MG PO TABS: 650.0000 mg | ORAL_TABLET | Freq: Four times a day (QID) | ORAL | Status: DC | PRN

## 2024-02-10 MED ORDER — ATORVASTATIN CALCIUM 40 MG PO TABS
ORAL_TABLET | ORAL | Status: AC
  Filled 2024-02-10: qty 1

## 2024-02-10 MED ORDER — GUAIFENESIN ER 600 MG PO TB12
600.0000 mg | ORAL_TABLET | Freq: Two times a day (BID) | ORAL | Status: DC
  Administered 2024-02-10 – 2024-02-20 (×19): 600 mg via ORAL
  Filled 2024-02-10 (×20): qty 1

## 2024-02-10 MED ORDER — MAGNESIUM SULFATE IN D5W 1-5 GM/100ML-% IV SOLN
1.0000 g | Freq: Once | INTRAVENOUS | Status: AC
  Administered 2024-02-10: 1 g via INTRAVENOUS
  Filled 2024-02-10: qty 100

## 2024-02-10 MED ORDER — SODIUM CHLORIDE 0.9% FLUSH
3.0000 mL | Freq: Two times a day (BID) | INTRAVENOUS | Status: DC
  Administered 2024-02-10 – 2024-02-20 (×19): 3 mL via INTRAVENOUS

## 2024-02-10 MED ORDER — ATORVASTATIN CALCIUM 40 MG PO TABS
40.0000 mg | ORAL_TABLET | Freq: Every evening | ORAL | Status: DC
  Administered 2024-02-10 – 2024-02-20 (×9): 40 mg via ORAL
  Filled 2024-02-10 (×10): qty 1

## 2024-02-10 MED ORDER — ACETAMINOPHEN 500 MG PO TABS
1000.0000 mg | ORAL_TABLET | Freq: Once | ORAL | Status: DC
  Filled 2024-02-10: qty 2

## 2024-02-10 MED ORDER — ALBUTEROL SULFATE (2.5 MG/3ML) 0.083% IN NEBU
2.5000 mg | INHALATION_SOLUTION | Freq: Four times a day (QID) | RESPIRATORY_TRACT | Status: DC | PRN
Start: 2024-02-10 — End: 2024-02-21

## 2024-02-10 MED ORDER — SODIUM CHLORIDE 0.9 % IV SOLN
500.0000 mg | Freq: Once | INTRAVENOUS | Status: AC
  Administered 2024-02-10: 500 mg via INTRAVENOUS
  Filled 2024-02-10: qty 5

## 2024-02-10 NOTE — ED Notes (Signed)
 RN- Joseph Tanner was provided a copy of Interior and spatial designer.

## 2024-02-10 NOTE — ED Provider Notes (Signed)
 [facility] AT [facility] Provider Note   CSN: [id number] Arrival date & time: 02/10/24  0840     Patient presents with: Cough and Shortness of Breath   Joseph Tanner is a 73 y.o. male.   73 y.o. male with history of HFrEF, CAD, COPD, HTN, HLD, hx cocaine abuse, tobacco use, and recurrent UTI presents for evaluation of shortness of breath.  Patient reports mild increased shortness of breath - especially with exertion - over the last week.  Patient feels that his symptoms may be secondary to increased saltine cracker intake.  He was eating more saltines because he was uneasy on my stomach.  He reports some chest discomfort yesterday.  He denies any chest discomfort now.  He reports that he does not typically have any chest pain.  Details of his episode of chest discomfort is difficult to be obtained.  Patient reports that his last alcohol intake was about a year ago.  He reports that his last cocaine use was about a year ago.  Patient reports full compliance with prescribed medications including his fluid pill.  He reports to this provider that he is not out of any of his medicines.  Last documented admission was in November 2024.  He followed up with cardiology once in November 2024 after hospital admission.  It does not appear that he has followed up with cardiology since.  The history is provided by the patient and medical records.       Prior to Admission medications   Medication Sig Start Date End Date Taking? Authorizing Provider  albuterol  (VENTOLIN  HFA) 108 (90 Base) MCG/ACT inhaler Inhale 2 puffs into the lungs every 6 (six) hours as needed for wheezing or shortness of breath. Patient not taking: Reported on 06/24/2023    [provider]  aspirin  EC 81 MG EC tablet Take 1 tablet (81 mg total) by mouth daily. Swallow whole. Patient not taking: Reported on 06/14/2023 11/25/20   Jadine Toribio SQUIBB, MD  atorvastatin   (LIPITOR) 40 MG tablet Take 1 tablet (40 mg total) by mouth every evening. Patient not taking: Reported on 06/24/2023 01/29/23   Lucien Orren SAILOR, PA-C  feeding supplement (ENSURE ENLIVE / ENSURE PLUS) LIQD Take 237 mLs by mouth 2 (two) times daily between meals. Patient not taking: Reported on 06/24/2023 06/19/23   Briana Elgin LABOR, MD  isosorbide-hydrALAZINE  (BIDIL) 20-37.5 MG tablet Take 0.5 tablets by mouth 3 (three) times daily. 06/25/23   Colletta Manuelita Garre, PA-C  Multiple Vitamin (MULTIVITAMIN WITH MINERALS) TABS tablet Take 1 tablet by mouth daily. Patient not taking: Reported on 06/14/2023 11/25/20   Jadine Toribio SQUIBB, MD  Tiotropium Bromide Monohydrate  (SPIRIVA  RESPIMAT) 1.25 MCG/ACT AERS Inhale 2 puffs into the lungs daily. Patient not taking: Reported on 06/24/2023 02/21/21   Merilee Andrea CROME, NP    Allergies: Patient has no known allergies.    Review of Systems  All other systems reviewed and are negative.   Updated Vital Signs BP (!) 150/98   Pulse 96   Temp (!) 97.5 F (36.4 C)   Resp (!) 22   Ht 6' 3 (1.905 m)   Wt 80.7 kg   SpO2 100%   BMI 22.25 kg/m   Physical Exam Vitals and nursing note reviewed.  Constitutional:      General: He is not in acute distress.    Appearance: Normal appearance. He is well-developed.  HENT:     Head: Normocephalic and atraumatic.  Eyes:  Conjunctiva/sclera: Conjunctivae normal.     Pupils: Pupils are equal, round, and reactive to light.  Neck:     Vascular: JVD present.  Cardiovascular:     Rate and Rhythm: Normal rate and regular rhythm.     Heart sounds: Normal heart sounds.  Pulmonary:     Effort: Pulmonary effort is normal. No respiratory distress.     Comments: Decreased breath sounds bilaterally with coarse breath sounds at bases Abdominal:     General: There is no distension.     Palpations: Abdomen is soft.     Tenderness: There is no abdominal tenderness.  Musculoskeletal:        General: No deformity.  Normal range of motion.     Cervical back: Normal range of motion and neck supple.  Skin:    General: Skin is warm and dry.  Neurological:     General: No focal deficit present.     Mental Status: He is alert and oriented to person, place, and time.     (all labs ordered are listed, but only abnormal results are displayed) Labs Reviewed  RESP PANEL BY RT-PCR (RSV, FLU A&B, COVID)  [id number]  CBC WITH DIFFERENTIAL/PLATELET  BRAIN NATRIURETIC PEPTIDE  COMPREHENSIVE METABOLIC PANEL WITH GFR  ETHANOL  RAPID URINE DRUG SCREEN, HOSP PERFORMED  URINALYSIS, W/ REFLEX TO CULTURE (INFECTION SUSPECTED)  TROPONIN I (HIGH SENSITIVITY)    EKG: EKG Interpretation Date/Time:  Tuesday February 10 2024 08:42:18 EDT Ventricular Rate:  98 PR Interval:  185 QRS Duration:  122 QT Interval:  378 QTC Calculation: 483 R Axis:   267  Text Interpretation: Sinus rhythm Left bundle branch block Confirmed by Darra Chew [phone]) on 02/10/2024 8:44:29 AM  Radiology: No results found.   Procedures   Medications Ordered in the ED - No data to display                                  Medical Decision Making Amount and/or Complexity of Data Reviewed Labs: ordered. Radiology: ordered.  Risk OTC drugs. Decision regarding hospitalization.    Medical Screen Complete  This patient presented to the ED with complaint of dyspnea.  This complaint involves an extensive number of treatment options. The initial differential diagnosis includes, but is not limited to, CHF exacerbation, infection, metabolic abnormality, etc.  This presentation is: Acute, Chronic, Self-Limited, Previously Undiagnosed, Uncertain Prognosis, Complicated, Systemic Symptoms, and Threat to Life/Bodily Function Patient presents with reported shortness of breath.  Presentation is concerning for possible CHF exacerbation versus infection.  Patient without fever.  White count is normal at 4.3.  Chest x-ray and CT imaging suggest left  sided infiltrate.  Cultures and antibiotics administered.  Patient with acute on chronic renal dysfunction.  Initial troponin is 85.  Cocaine on toxicology screen is positive.  Patient would benefit from admission for further workup and treatment.  Hospitalist service made aware of case.  Additional history obtained: External records from outside sources obtained and reviewed including prior ED visits and prior Inpatient records.   Problem List / ED Course:  Dyspnea    Disposition:  After consideration of the diagnostic results and the patients response to treatment, I feel that the patent would benefit from admission.  CRITICAL CARE Performed by: Maude JAYSON Galloway   Total critical care time: 45 minutes  Critical care time was exclusive of separately billable procedures and treating other patients.  Critical care was necessary  to treat or prevent imminent or life-threatening deterioration.  Critical care was time spent personally by me on the following activities: development of treatment plan with patient and/or surrogate as well as nursing, discussions with consultants, evaluation of patient's response to treatment, examination of patient, obtaining history from patient or surrogate, ordering and performing treatments and interventions, ordering and review of laboratory studies, ordering and review of radiographic studies, pulse oximetry and re-evaluation of patient's condition.        Final diagnoses:  Dyspnea, unspecified type    ED Discharge Orders     None          Laurice Maude BROCKS, MD 02/10/24 1433

## 2024-02-10 NOTE — ED Notes (Signed)
 Ccmd called

## 2024-02-10 NOTE — ED Notes (Signed)
 CCMD called and notified

## 2024-02-10 NOTE — Progress Notes (Signed)
 [facility] night cross cover note:   I was notified by the patient's RN  that the patient's updated LA has trended up from 5.1 around 1340 today to the current value of > 9.0.   Most recent vital signs appear stable, including afebrile, with heart rates in the 90s, most recent systolic blood pressures in the 140s mmHg, respiratory rate 17, and oxygen saturation 100% on room air.  Per my chart review, it appears that there are several reasons for elevation in lactic acid level with this patient, including his recent cocaine use, suspected acutely decompensated heart failure, and potentially also a contribution from pna, for the which the patient is receiving iv abx.   He appears hemodynamically stable. Given the above suspicion for volume overload, will hold off on providing IV fluids at this time, but rather continue with existing orders for IV diuresis. Will also continue iv abx, as currently ordered, and repeat LA level with tomorrow AM labs.     Eva Pore, DO Hospitalist

## 2024-02-10 NOTE — H&P (Addendum)
 History and Physical    Patient: Joseph Tanner FMW:[id number] DOB: 05-12-1951 DOA: 02/10/2024 DOS: the patient was seen and examined on 02/10/2024 PCP: Pcp, No  Patient coming from: Home via EMS  Chief Complaint:  Chief Complaint  Patient presents with   Cough   Shortness of Breath   HPI: Joseph Tanner is a 73 y.o. male with medical history significant of hypertension, hyperlipidemia, combined systolic and diastolic CHF, NSTEMI, CAD s/p PCI, COPD, CKD stage IV, and cocaine abuse presents with shortness of breath.  History is limited due to his shortness of breath.  He has been experiencing shortness of breath for the past couple of days, which worsens when lying down. He also has a cough without sputum production and a lack of appetite. The patient reports feeling cold, which he attributes to the room temperature.  He mentions slight swelling and feeling cold, which he attributes to room temperature. No issues with urination are reported, and he has recently had a bowel movement.  He reports that he has been taking his medications including furosemide , Entresto , and other medications although no recent fill present.  His daughter makes note that he had not gone to any follow-up appointments that she is aware of this year since he was last admitted into the hospital back in 05/2023 for CHF exacerbation.  In the emergency department patient was noted to be afebrile with tachypnea, blood pressures 110/56 to 150/98, and O2 saturations maintained.  Labs noted WBC 4.3, hemoglobin 8.8, platelets 96, potassium 5.6, CO2 15, BUN 36, creatinine 2.95, magnesium  1.6, AST 48, ALT 33, high-sensitivity troponin 85, and lactic acid 5.8.  Chest x-ray showed left basilar opacity suspicious for aspiration or pneumonia and trace blunting of the bilateral costophrenic angles concerning for trace pleural effusions.  Patient had been given empiric antibiotics of Rocephin  and azithromycin .  Review  of Systems: As mentioned in the history of present illness. All other systems reviewed and are negative. Past Medical History:  Diagnosis Date   COPD (chronic obstructive pulmonary disease) (HCC)    Hyperlipemia    Hypertension    NSTEMI (non-ST elevated myocardial infarction) (HCC)    Pure hypercholesterolemia 03/15/2021   No past surgical history on file. Social History:  reports that he has been smoking cigarettes. He has never used smokeless tobacco. He reports that he does not currently use alcohol. He reports current drug use. Drug: Cocaine.  No Known Allergies  Family History  Problem Relation Age of Onset   Heart failure Mother    Heart failure Maternal Grandmother    Heart failure Maternal Grandfather    Heart failure Paternal Grandmother    Heart failure Paternal Grandfather     Prior to Admission medications   Medication Sig Start Date End Date Taking? Authorizing Provider  albuterol  (VENTOLIN  HFA) 108 (90 Base) MCG/ACT inhaler Inhale 2 puffs into the lungs every 6 (six) hours as needed for wheezing or shortness of breath. Patient not taking: Reported on 06/24/2023    [provider]  aspirin  EC 81 MG EC tablet Take 1 tablet (81 mg total) by mouth daily. Swallow whole. Patient not taking: Reported on 06/14/2023 11/25/20   Jadine Toribio SQUIBB, MD  atorvastatin  (LIPITOR) 40 MG tablet Take 1 tablet (40 mg total) by mouth every evening. Patient not taking: Reported on 06/24/2023 01/29/23   Lucien Orren SAILOR, PA-C  feeding supplement (ENSURE ENLIVE / ENSURE PLUS) LIQD Take 237 mLs by mouth 2 (two) times daily between meals. Patient not  taking: Reported on 06/24/2023 06/19/23   Briana Elgin LABOR, MD  isosorbide-hydrALAZINE  (BIDIL) 20-37.5 MG tablet Take 0.5 tablets by mouth 3 (three) times daily. 06/25/23   Colletta Manuelita Garre, PA-C  Multiple Vitamin (MULTIVITAMIN WITH MINERALS) TABS tablet Take 1 tablet by mouth daily. Patient not taking: Reported on 06/14/2023 11/25/20    Jadine Toribio SQUIBB, MD  Tiotropium Bromide Monohydrate  (SPIRIVA  RESPIMAT) 1.25 MCG/ACT AERS Inhale 2 puffs into the lungs daily. Patient not taking: Reported on 06/24/2023 02/21/21   Merilee Andrea CROME, NP    Physical Exam: Vitals:   02/10/24 0945 02/10/24 1000 02/10/24 1015 02/10/24 1030  BP: (!) 110/56 (!) 142/94 (!) 146/104 (!) 146/96  Pulse:      Resp: (!) 27 (!) 31 (!) 26 (!) 29  Temp:      SpO2:      Weight:      Height:        Constitutional: Elderly male who appears to be in respiratory distress Eyes: PERRL, lids and conjunctivae normal ENMT: Mucous membranes are moist.   Neck: normal, supple, JVD present Respiratory: Decreased breath sounds with crackles and rhonchi appreciated.  O2 saturations currently appear to be maintained on room air. Cardiovascular: Regular rate and rhythm, no murmurs / rubs / gallops.  Legs +1 bilateral lower extremity edema present. Abdomen: no tenderness, no masses palpated. . Bowel sounds positive.  Musculoskeletal: no clubbing / cyanosis. No joint deformity upper and lower extremities. Good ROM, no contractures. Normal muscle tone.  Skin: no rashes, lesions, ulcers. No induration Neurologic: CN 2-12 grossly intact. Sensation intact, DTR normal. Strength 5/5 in all 4.  Psychiatric: Normal judgment and insight. Alert and oriented x 3. Normal mood.   Data Reviewed:  EKG revealed a sinus rhythm at 98 bpm with a left bundle branch block.  Reviewed labs, imaging, and pertinent records as documented.  Assessment and Plan:  Possible community-acquired pneumonia Acute.  Patient presented with complaints of shortness of breath and productive cough.  Chest x-ray concerning for a left base opacity suspicious for aspiration or pneumonia and trace blunting of the bilateral costophrenic angles.  Blood cultures were obtained.  Patient had been started on empiric antibiotics of Rocephin  and azithromycin . - Admit to a telemetry bed - Nasal cannula oxygen to  maintain O2 saturations greater than 90% - Incentive spirometry and flutter valve - Follow-up blood cultures - Check procalcitonin and BNP - Continue antibiotics of Rocephin  and azithromycin  - Mucinex   Combined systolic and diastolic CHF Acute on chronic.  On physical exam patient has JVD present with at least +1 pitting bilateral lower extremity edema.  Chest x-ray also noted blunting of the bilateral costophrenic angles concerning for pleural effusions.  Last echocardiogram noted EF to be around 15% with grade 3 restrictive diastolic function.  Patient had previously declined ICD placement. - Strict I&O's and daily weights - Check BNP - Start Lasix  40 mg IV twice daily.  Reassess diuresis in a.m. - Goal-directed medical limited due to kidney function - Consult to cardiology placed for in a.m.  Urinary tract infection UA positive for moderate hemoglobin, moderate leukocytes, few bacteria, and greater than 50 WBCs. - Check urine culture - Continue Rocephin   Lactic acidosis Non anion gap metabolic acidosis  Acute.  Patient noted to have a nonanion gap metabolic acidosis.  Lactic acid initially elevated at 5.8.  Thought possibly related to recent cocaine - Continue to trend lactic acid level  Elevated troponin CAD High-sensitivity troponin 86.  LHC 09/2020 with severe  stenosis small Lcx and mild moderate stenosis RCA that was to be treated medically.  - Continue to monitor  Acute kidney injury superimposed on chronic kidney disease stage IV Patient presents with creatinine elevated up to 2.95 with BUN 35.  Baseline creatinine noted to be around 2.3.  Unclear if secondary to hypoperfusion in setting of CHF versus decreased - Continue to monitor kidney function with diuresis  Hyperkalemia Acute.  Initial potassium noted to be elevated at 5.6.  Treating with Lasix  IV. - Recheck potassium level in a.m.  Pancytopenia Chronic.  Hemoglobin noted to be 8.8 with low MCV and MCH and  platelet count 96. - Continue to monitor  Hypomagnesemia Acute.  Magnesium  level noted to be 1.6. - Magnesium  sulfate   Cocaine abuse UDS was noted to be positive for cocaine. - Continue to counsel on the cessation of cocaine use  DVT prophylaxis:SCDs Advance Care Planning:   Code Status: Full Code    Consults: None  Family Communication: Patient's daughter updated over the phone Severity of Illness: The appropriate patient status for this patient is INPATIENT. Inpatient status is judged to be reasonable and necessary in order to provide the required intensity of service to ensure the patient's safety. The patient's presenting symptoms, physical exam findings, and initial radiographic and laboratory data in the context of their chronic comorbidities is felt to place them at high risk for further clinical deterioration. Furthermore, it is not anticipated that the patient will be medically stable for discharge from the hospital within 2 midnights of admission.   * I certify that at the point of admission it is my clinical judgment that the patient will require inpatient hospital care spanning beyond 2 midnights from the point of admission due to high intensity of service, high risk for further deterioration and high frequency of surveillance required.*  Author: Maximino DELENA Sharps, MD 02/10/2024 11:21 AM  For on call review [email or web address].

## 2024-02-10 NOTE — Progress Notes (Signed)
 [facility] night cross cover note:   I was notified by RN of the patient's request for a sleep aid. I subsequently placed order for prn melatonin for insomnia.     Newton Pigg, DO Hospitalist

## 2024-02-10 NOTE — ED Triage Notes (Signed)
 Pt to ED BIB EMS from home with CC of non-productive cough, s/s of URI, and mild SOB x 14 days. Pt Aox4 and appears to be in no distress at his time.

## 2024-02-11 ENCOUNTER — Other Ambulatory Visit: Payer: Self-pay

## 2024-02-11 ENCOUNTER — Inpatient Hospital Stay (HOSPITAL_COMMUNITY)

## 2024-02-11 DIAGNOSIS — J189 Pneumonia, unspecified organism: Secondary | ICD-10-CM | POA: Diagnosis not present

## 2024-02-11 DIAGNOSIS — R570 Cardiogenic shock: Secondary | ICD-10-CM | POA: Diagnosis not present

## 2024-02-11 DIAGNOSIS — I5023 Acute on chronic systolic (congestive) heart failure: Secondary | ICD-10-CM | POA: Diagnosis not present

## 2024-02-11 LAB — POTASSIUM: Potassium: 4.4 mmol/L (ref 3.5–5.1)

## 2024-02-11 LAB — BASIC METABOLIC PANEL WITH GFR
Anion gap: 19 — ABNORMAL HIGH (ref 5–15)
BUN: 39 mg/dL — ABNORMAL HIGH (ref 8–23)
CO2: 11 mmol/L — ABNORMAL LOW (ref 22–32)
Calcium: 9.2 mg/dL (ref 8.9–10.3)
Chloride: 109 mmol/L (ref 98–111)
Creatinine, Ser: 3.28 mg/dL — ABNORMAL HIGH (ref 0.61–1.24)
GFR, Estimated: 19 mL/min — ABNORMAL LOW (ref >60)
Glucose, Bld: 103 mg/dL — ABNORMAL HIGH (ref 70–99)
Potassium: 5.9 mmol/L — ABNORMAL HIGH (ref 3.5–5.1)
Sodium: 139 mmol/L (ref 135–145)

## 2024-02-11 LAB — ECHOCARDIOGRAM COMPLETE
Area-P 1/2: 4.39 cm2
Calc EF: 21.2 %
Height: 75 in
S' Lateral: 6.3 cm
Single Plane A2C EF: 21.6 %
Single Plane A4C EF: 21.4 %
Weight: 2433.88 [oz_av]

## 2024-02-11 LAB — CBC
HCT: 27.3 % — ABNORMAL LOW (ref 39.0–52.0)
Hemoglobin: 8.2 g/dL — ABNORMAL LOW (ref 13.0–17.0)
MCH: 23.3 pg — ABNORMAL LOW (ref 26.0–34.0)
MCHC: 30 g/dL (ref 30.0–36.0)
MCV: 77.6 fL — ABNORMAL LOW (ref 80.0–100.0)
Platelets: 86 K/uL — ABNORMAL LOW (ref 150–400)
RBC: 3.52 MIL/uL — ABNORMAL LOW (ref 4.22–5.81)
RDW: 23.2 % — ABNORMAL HIGH (ref 11.5–15.5)
WBC: 5.3 K/uL (ref 4.0–10.5)
nRBC: 0 % (ref 0.0–0.2)

## 2024-02-11 LAB — LACTIC ACID, PLASMA: Lactic Acid, Venous: 7.6 mmol/L (ref 0.5–1.9)

## 2024-02-11 MED ORDER — SODIUM CHLORIDE 0.9% FLUSH: 10.0000 mL | INTRAVENOUS | Status: DC | PRN

## 2024-02-11 MED ORDER — FUROSEMIDE 10 MG/ML IJ SOLN
80.0000 mg | Freq: Two times a day (BID) | INTRAMUSCULAR | Status: DC
  Administered 2024-02-11 – 2024-02-12 (×3): 80 mg via INTRAVENOUS
  Filled 2024-02-11 (×4): qty 8

## 2024-02-11 MED ORDER — SODIUM CHLORIDE 0.9% FLUSH
10.0000 mL | Freq: Two times a day (BID) | INTRAVENOUS | Status: DC
  Administered 2024-02-11 – 2024-02-19 (×16): 10 mL
  Administered 2024-02-20: 20 mL

## 2024-02-11 MED ORDER — PERFLUTREN LIPID MICROSPHERE
1.0000 mL | INTRAVENOUS | Status: AC | PRN
  Administered 2024-02-11: 2 mL via INTRAVENOUS

## 2024-02-11 MED ORDER — SODIUM ZIRCONIUM CYCLOSILICATE 10 G PO PACK
10.0000 g | PACK | Freq: Once | ORAL | Status: AC
  Administered 2024-02-11: 10 g via ORAL
  Filled 2024-02-11: qty 1

## 2024-02-11 MED ORDER — AZITHROMYCIN 500 MG PO TABS
500.0000 mg | ORAL_TABLET | Freq: Every day | ORAL | Status: AC
  Administered 2024-02-11 – 2024-02-12 (×2): 500 mg via ORAL
  Filled 2024-02-11 (×2): qty 1

## 2024-02-11 MED ORDER — CHLORHEXIDINE GLUCONATE CLOTH 2 % EX PADS
6.0000 | MEDICATED_PAD | Freq: Every day | CUTANEOUS | Status: DC
  Administered 2024-02-12 – 2024-02-20 (×9): 6 via TOPICAL

## 2024-02-11 MED ORDER — SODIUM BICARBONATE 650 MG PO TABS
650.0000 mg | ORAL_TABLET | Freq: Three times a day (TID) | ORAL | Status: DC
  Administered 2024-02-11 – 2024-02-12 (×4): 650 mg via ORAL
  Filled 2024-02-11 (×5): qty 1

## 2024-02-11 MED ORDER — DOBUTAMINE-DEXTROSE 4-5 MG/ML-% IV SOLN
2.5000 ug/kg/min | INTRAVENOUS | Status: DC
  Administered 2024-02-12: 5 ug/kg/min via INTRAVENOUS
  Filled 2024-02-11 (×2): qty 250

## 2024-02-11 NOTE — Plan of Care (Signed)
   Problem: Education: Goal: Knowledge of General Education information will improve Description: Including pain rating scale, medication(s)/side effects and non-pharmacologic comfort measures Outcome: Progressing   Problem: Nutrition: Goal: Adequate nutrition will be maintained Outcome: Progressing   Problem: Coping: Goal: Level of anxiety will decrease Outcome: Progressing

## 2024-02-11 NOTE — Progress Notes (Addendum)
 Transition of Care [facility]) - Inpatient Brief Assessment   Patient Details  Name: Joseph Tanner MRN: [id number] Date of Birth: 02/04/1951  Transition of Care [facility]) CM/SW Contact:    Joseph JONELLE [name], RN Phone Number: 02/11/2024, 11:48 AM   Clinical Narrative: CM met with the patient at the bedside to discuss Inpatient Care management needs.  The patient lives at home alone and admitted to the hospital with pneumonia - currently on antibiotics.  Patient is currently on room air.  Patient with recent use of cocaine.  Patient is independent at home and is able to drive to appointments.  DME at the home includes [place] [facility].  Resources included in the AVS for OP counseling for substance abuse including cocaine use.  Numerous PCP offices were called and earliest appointment scheduled for hospital follow up - scheduled for March 23, 2024 at 10:20 am with Joseph Tanner - noted in the AVS.  02/11/24 1345 - PT is recommending home health - unfortunately patient with recent history of cocaine use and I am unable to provide home health services due to agency declining patient for safe care in the home.  I will follow up with patient to offer Outpatient PT as therapy plan.  PT evaluation pending at this time.  Patient states that patient's family can provide transportation to home when patient is stable.  CM with Inpatient Care Management team will continue to follow the patient for care needs as patient progresses.     Transition of Care Asessment: Insurance and Status: (P) Insurance coverage has been reviewed Patient has primary care physician: (P) Yes Home environment has been reviewed: (P) from home alone Prior level of function:: (P) Independent Prior/Current Home Services: (P) No current home services Social Drivers of Health Review: (P) SDOH reviewed needs interventions Readmission risk has been reviewed: (P) Yes Transition of care needs: (P) transition of care needs  identified, TOC will continue to follow

## 2024-02-11 NOTE — Progress Notes (Signed)
 Peripherally Inserted Central Catheter Placement  The IV Nurse has discussed with the patient and/or persons authorized to consent for the patient, the purpose of this procedure and the potential benefits and risks involved with this procedure.  The benefits include less needle sticks, lab draws from the catheter, and the patient may be discharged home with the catheter. Risks include, but not limited to, infection, bleeding, blood clot (thrombus formation), and puncture of an artery; nerve damage and irregular heartbeat and possibility to perform a PICC exchange if needed/ordered by physician.  Alternatives to this procedure were also discussed.  Bard Power PICC patient education guide, fact sheet on infection prevention and patient information card has been provided to patient /or left at bedside.    PICC Placement Documentation  PICC Double Lumen 02/11/24 Right Brachial 40 cm 0 cm (Active)  Indication for Insertion or Continuance of Line Vasoactive infusions 02/11/24 1700  Exposed Catheter (cm) 0 cm 02/11/24 1700  Site Assessment Clean, Dry, Intact 02/11/24 1700  Lumen #1 Status Flushed;Saline locked;Blood return noted 02/11/24 1700  Lumen #2 Status Flushed;Saline locked;Blood return noted 02/11/24 1700  Dressing Type Transparent;Securing device 02/11/24 1700  Dressing Status Antimicrobial disc/dressing in place;Clean, Dry, Intact 02/11/24 1700  Line Care Connections checked and tightened 02/11/24 1700  Line Adjustment (NICU/IV Team Only) No 02/11/24 1700  Dressing Intervention New dressing;Adhesive placed at insertion site (IV team only) 02/11/24 1700  Dressing Change Due 02/18/24 02/11/24 1700       Ethyl Adonna Fuller 02/11/2024, 5:05 PM

## 2024-02-11 NOTE — Plan of Care (Signed)

## 2024-02-11 NOTE — Progress Notes (Incomplete Revision)
 Transition of Care [facility]) - Inpatient Brief Assessment   Patient Details  Name: Joseph Tanner MRN: [id number] Date of Birth: 02/04/1951  Transition of Care [facility]) CM/SW Contact:    Joseph JONELLE [name], RN Phone Number: 02/11/2024, 11:48 AM   Clinical Narrative: CM met with the patient at the bedside to discuss Inpatient Care management needs.  The patient lives at home alone and admitted to the hospital with pneumonia - currently on antibiotics.  Patient is currently on room air.  Patient with recent use of cocaine.  Patient is independent at home and is able to drive to appointments.  DME at the home includes [place].  Resources included in the AVS for OP counseling for substance abuse including cocaine use.  Numerous PCP offices were called and earliest appointment scheduled for hospital follow up - scheduled for March 23, 2024 at 10:20 am with Dr. Billy - noted in the AVS.  02/11/24 1345 - PT is recommending home health - unfortunately patient with recent history of cocaine use and I am unable to provide home health services due to agency declining patient for safe care in the home.  I will follow up with patient to offer Outpatient PT as therapy plan.  PT evaluation pending at this time.  Patient states that patient's family can provide transportation to home when patient is stable.  CM with Inpatient Care Management team will continue to follow the patient for care needs as patient progresses.     Transition of Care Asessment: Insurance and Status: (P) Insurance coverage has been reviewed Patient has primary care physician: (P) Yes Home environment has been reviewed: (P) from home alone Prior level of function:: (P) Independent Prior/Current Home Services: (P) No current home services Social Drivers of Health Review: (P) SDOH reviewed needs interventions Readmission risk has been reviewed: (P) Yes Transition of care needs: (P) transition of care needs  identified, TOC will continue to follow

## 2024-02-11 NOTE — Progress Notes (Signed)
 Discussed with Dr. Trixie regarding continuation of azithromycin  from 1x ED dose for possible PNA. Agreed to continue azithromycin  500 mg daily x total 3 doses. Orders have been updated.   Maurilio Patten, PharmD PGY1 Pharmacy Resident [facility]  02/11/2024 8:07 AM

## 2024-02-11 NOTE — Progress Notes (Signed)
  Echocardiogram 2D Echocardiogram has been performed.  Joseph Tanner 02/11/2024, 1:40 PM

## 2024-02-11 NOTE — Evaluation (Addendum)
 Physical Therapy Evaluation Patient Details Name: Joseph Tanner MRN: [id number] DOB: Jul 23, 1951 Today's Date: 02/11/2024  History of Present Illness  73 y.o. male presents to [facility]/10/24 with SOB. Chest x-ray concerning for aspiration or PNA, +UTI, + cocaine. PMHx: hypertension, hyperlipidemia, combined systolic and diastolic CHF, NSTEMI, CAD s/p PCI, COPD, CKD stage IV, and cocaine abuse   Clinical Impression  Pt in bed upon arrival and agreeable to limited PT eval. PTA, pt was ModI with SP Cane or no AD. In today's session, pt was CGA to moving into long-sitting with use of one handrail. Pt then declined further mobility due to fatigue. Encouraged pt to mobilize with pt agreeing to ambulate in future therapy sessions. Pt has intermittent level of assist available at home. Pt will need to be ModI for mobility to return home safely. Pending progress with mobility, recommending home with HHPT to address functional mobility deficits. Pt would benefit from acute skilled PT with current functional limitations listed below (see PT Problem List). Acute PT to follow.         If plan is discharge home, recommend the following: A little help with walking and/or transfers;Assist for transportation;Help with stairs or ramp for entrance   Can travel by private vehicle    TBD    Equipment Recommendations  (TBD RW)     Functional Status Assessment Patient has had a recent decline in their functional status and demonstrates the ability to make significant improvements in function in a reasonable and predictable amount of time.     Precautions / Restrictions Precautions Precautions: Fall Restrictions Weight Bearing Restrictions Per Provider Order: No      Mobility  Bed Mobility Overal bed mobility: Needs Assistance Bed Mobility: Supine to Sit    Supine to sit: Contact guard, Used rails    General bed mobility comments: pulled self into long-sitting with CGA and use of one hand rail.  Pt declined further transfer or mobility due to fatigue    Transfers  General transfer comment: Pt declined       Balance Overall balance assessment: Needs assistance         Pertinent Vitals/Pain Pain Assessment Pain Assessment: No/denies pain    Home Living Family/patient expects to be discharged to:: Private residence Living Arrangements: Alone Available Help at Discharge: Family;Available PRN/intermittently (daughter) Type of Home: Apartment Home Access: Elevator;Stairs to enter Entrance Stairs-Rails: Can reach both Entrance Stairs-Number of Steps: 14/1 flight-only if elevator out of service   Home Layout: One level Home Equipment: Cane - single point Additional Comments: has elevator that he uses primarily    Prior Function Prior Level of Function : Independent/Modified Independent;Driving   Mobility Comments: ModI with no AD or SP cane. Denies falls ADLs Comments: independent, does not drive     Extremity/Trunk Assessment   Upper Extremity Assessment Upper Extremity Assessment: Defer to OT evaluation    Lower Extremity Assessment Lower Extremity Assessment: Generalized weakness       Communication   Communication Communication: No apparent difficulties    Cognition Arousal: Alert Behavior During Therapy: WFL for tasks assessed/performed   PT - Cognitive impairments: No apparent impairments    Following commands: Intact       Cueing Cueing Techniques: Verbal cues, Tactile cues      PT Assessment Patient needs continued PT services  PT Problem List Decreased strength;Decreased activity tolerance;Decreased balance;Decreased mobility;Decreased range of motion       PT Treatment Interventions DME instruction;Gait training;Functional mobility training;Therapeutic exercise;Therapeutic activities;Balance  training;Neuromuscular re-education;Patient/family education    PT Goals (Current goals can be found in the Care Plan section)  Acute Rehab PT  Goals Patient Stated Goal: to get stronger PT Goal Formulation: With patient Time For Goal Achievement: 02/25/24 Potential to Achieve Goals: Good    Frequency Min 2X/week        AM-PAC PT 6 Clicks Mobility  Outcome Measure Help needed turning from your back to your side while in a flat bed without using bedrails?: A Little Help needed moving from lying on your back to sitting on the side of a flat bed without using bedrails?: A Little Help needed moving to and from a bed to a chair (including a wheelchair)?: A Little Help needed standing up from a chair using your arms (e.g., wheelchair or bedside chair)?: A Little Help needed to walk in hospital room?: A Lot Help needed climbing 3-5 steps with a railing? : A Lot 6 Click Score: 16    End of Session   Activity Tolerance: Patient limited by fatigue;Patient limited by lethargy Patient left: in bed;with call bell/phone within reach Nurse Communication: Mobility status PT Visit Diagnosis: Unsteadiness on feet (R26.81);Other abnormalities of gait and mobility (R26.89);Muscle weakness (generalized) (M62.81)    Time: 8858-8846 PT Time Calculation (min) (ACUTE ONLY): 12 min   Charges:   PT Evaluation $PT Eval Low Complexity: 1 Low   PT General Charges $$ ACUTE PT VISIT: 1 Visit       Kate ORN, PT, DPT Secure Chat Preferred  Rehab Office [phone]   Kate BRAVO Wendolyn 02/11/2024, 12:05 PM

## 2024-02-11 NOTE — Progress Notes (Signed)
 PROGRESS NOTE  Joseph Tanner FMW:[id number] DOB: [birth year] DOA: 02/10/2024 PCP: Pcp, No   LOS: 1 day   Brief Narrative / Interim history: 73 y.o. male with medical history significant of hypertension, hyperlipidemia, combined systolic and diastolic CHF, NSTEMI, CAD s/p PCI, COPD, CKD stage IV, and cocaine abuse presents with shortness of breath.  Patient reports similar episode last November, he was hospitalized, improved and sent home.  Has not had any follow-up since.  Current symptoms started for the past week or so.  He was found to be fluid overload in the ER, admitted to the hospital and placed on diuretics.  Cardiology was consulted.  He reports he quit tobacco and alcohol for several months, last time he used cocaine was about 4 weeks ago  Subjective / 24h Interval events: Still complains of shortness of breath this morning.  Assesement and Plan: Principal problem Acute on chronic combined CHF -patient admitted to the hospital with significant fluid overload.  Most recent 2D echo last year showed EF 15% with grade 3 diastolic dysfunction.  Has been placed on Lasix , continue for now but I am concerned about his creatinine climbing - Cardiology consulted, query cardiorenal syndrome/RHC/potentially inotrope use but defer to the cardiology team  Active problems Acute kidney injury on chronic kidney disease stage IV -baseline creatinine 2.2-2.4.  Currently at 3.2.  Urine output not fully charted, but patient tells me he is urinating well.  Closely watch  Possible pneumonia -based on the CT scan, but not sure that he truly has pneumonia with evidence of fluid overload, no fever, low procalcitonin and no white count elevation.  Monitor cultures, if negative by tomorrow we will discontinue antibiotics  Elevated lactic acid, anion gap metabolic acidosis-with concern of poor perfusion, renal failure.  Start sodium bicarb  UTI-asymptomatic, this is likely bacteriuria  CAD, elevated  troponin -likely demand ischemia, not in a pattern consistent with ACS.  No chest pain.  Left heart cath March 2022 with severe stenosis small Lcx and mild moderate stenosis RCA that was to be treated medically.   Hyperkalemia-due to renal failure.  Give IV Lasix , Lokelma .  Monitor this afternoon  Hypomagnesemia-magnesium  replenished  Cocaine use -none for the past 4 weeks per patient, however UDS still positive  Thrombocytopenia, anemia -fairly chronic, no bleeding, monitor  Scheduled Meds:  acetaminophen   1,000 mg Oral Once   atorvastatin   40 mg Oral QPM   azithromycin   500 mg Oral Daily   furosemide   40 mg Intravenous BID   guaiFENesin   600 mg Oral BID   sodium chloride  flush  3 mL Intravenous Q12H   Continuous Infusions:  cefTRIAXone  (ROCEPHIN )  IV Stopped (02/10/24 1736)   PRN Meds:.acetaminophen  **OR** acetaminophen , albuterol , melatonin, ondansetron  **OR** ondansetron  (ZOFRAN ) IV  Current Outpatient Medications  Medication Instructions   albuterol  (VENTOLIN  HFA) 108 (90 Base) MCG/ACT inhaler 2 puffs, Every 6 hours PRN   atorvastatin  (LIPITOR) 40 mg, Oral, Every evening   carvedilol  (COREG ) 3.125 mg, Oral, Daily   ibuprofen (ADVIL) 1,600 mg, Oral, As needed   isosorbide-hydrALAZINE  (BIDIL) 20-37.5 MG tablet 0.5 tablets, Oral, 3 times daily   Multiple Vitamin (MULTIVITAMIN WITH MINERALS) TABS tablet 1 tablet, Oral, Daily   sacubitril -valsartan  (ENTRESTO ) 24-26 MG 1 tablet, Oral, Every morning   spironolactone  (ALDACTONE ) 25 mg, Oral, Daily    Diet Orders (From admission, onward)     Start     Ordered   02/10/24 1143  Diet Heart Room service appropriate? Yes; Fluid consistency: Thin  Diet  effective now       Question Answer Comment  Room service appropriate? Yes   Fluid consistency: Thin      02/10/24 1145            DVT prophylaxis: SCDs Start: 02/10/24 1140   Lab Results  Component Value Date   PLT 86 (L) 02/11/2024      Code Status: Full  Code  Family Communication: No family at bedside  Status is: Inpatient Remains inpatient appropriate because: Severity of illness   Level of care: Telemetry Medical  Consultants:  Cardiology  Objective: Vitals:   02/10/24 2352 02/11/24 0231 02/11/24 0634 02/11/24 0858  BP: (!) 141/94 (!) 134/96  116/79  Pulse: 94 91  85  Resp: 18 18  17   Temp:  (!) 97.3 F (36.3 C)  98.9 F (37.2 C)  TempSrc:      SpO2: 100% 100%  100%  Weight:   69 kg   Height:        Intake/Output Summary (Last 24 hours) at 02/11/2024 1128 Last data filed at 02/11/2024 0135 Gross per 24 hour  Intake 353.55 ml  Output 260 ml  Net 93.55 ml   Wt Readings from Last 3 Encounters:  02/11/24 69 kg  06/24/23 67 kg  06/14/23 64.6 kg    Examination:  Constitutional: NAD Eyes: no scleral icterus ENMT: Mucous membranes are moist.  Neck: normal, supple Respiratory: Faint bibasilar crackles, no wheezing Cardiovascular: Regular rate and rhythm, no murmurs / rubs / gallops.  2+ pitting edema Abdomen: non distended, no tenderness. Bowel sounds positive.  Musculoskeletal: no clubbing / cyanosis.   Data Reviewed: I have independently reviewed following labs and imaging studies   CBC Recent Labs  Lab 02/10/24 0900 02/11/24 0326  WBC 4.3 5.3  HGB 8.8* 8.2*  HCT 29.5* 27.3*  PLT 96* 86*  MCV 78.0* 77.6*  MCH 23.3* 23.3*  MCHC 29.8* 30.0  RDW 23.3* 23.2*  LYMPHSABS 0.3*  --   MONOABS 0.2  --   EOSABS 0.0  --   BASOSABS 0.0  --     Recent Labs  Lab 02/10/24 0900 02/10/24 1103 02/10/24 1339 02/10/24 2028 02/10/24 2142 02/11/24 0326  NA 136  --   --   --   --  139  K 5.6*  --   --   --   --  5.9*  CL 108  --   --   --   --  109  CO2 15*  --   --   --   --  11*  GLUCOSE 115*  --   --   --   --  103*  BUN 36*  --   --   --   --  39*  CREATININE 2.95*  --   --   --   --  3.28*  CALCIUM  9.0  --   --   --   --  9.2  AST 48*  --   --   --   --   --   ALT 33  --   --   --   --   --   ALKPHOS  57  --   --   --   --   --   BILITOT 5.8*  --   --   --   --   --   ALBUMIN 2.8*  --   --   --   --   --   MG 1.6*  --   --   --   --   --  PROCALCITON  --   --   --  0.59  --   --   LATICACIDVEN  --  5.8* 5.1*  --  >9.0* 7.6*  BNP  --   --   --  >4,500.0*  --   --     ------------------------------------------------------------------------------------------------------------------ No results for input(s): CHOL, HDL, LDLCALC, TRIG, CHOLHDL, LDLDIRECT in the last 72 hours.  No results found for: HGBA1C ------------------------------------------------------------------------------------------------------------------ No results for input(s): TSH, T4TOTAL, T3FREE, THYROIDAB in the last 72 hours.  Invalid input(s): FREET3  Cardiac Enzymes No results for input(s): CKMB, TROPONINI, MYOGLOBIN in the last 168 hours.  Invalid input(s): CK ------------------------------------------------------------------------------------------------------------------    Component Value Date/Time   BNP >4,500.0 (H) 02/10/2024 2028    CBG: No results for input(s): GLUCAP in the last 168 hours.  Recent Results (from the past 240 hours)  Resp panel by RT-PCR (RSV, Flu A&B, Covid) Anterior Nasal Swab     Status: None   Collection Time: 02/10/24  8:51 AM   Specimen: Anterior Nasal Swab  Result Value Ref Range Status   SARS Coronavirus 2 by RT PCR NEGATIVE NEGATIVE Final   Influenza A by PCR NEGATIVE NEGATIVE Final   Influenza B by PCR NEGATIVE NEGATIVE Final    Comment: (NOTE) The Xpert Xpress SARS-CoV-2/FLU/RSV plus assay is intended as an aid in the diagnosis of influenza from Nasopharyngeal swab specimens and should not be used as a sole basis for treatment. Nasal washings and aspirates are unacceptable for Xpert Xpress SARS-CoV-2/FLU/RSV testing.  Fact Sheet for Patients: BloggerCourse.com  Fact Sheet for Healthcare  Providers: SeriousBroker.it  This test is not yet approved or cleared by the [place]  FDA and has been authorized for detection and/or diagnosis of SARS-CoV-2 by FDA under an Emergency Use Authorization (EUA). This EUA will remain in effect (meaning this test can be used) for the duration of the COVID-19 declaration under Section 564(b)(1) of the Act, 21 U.S.C. section 360bbb-3(b)(1), unless the authorization is terminated or revoked.     Resp Syncytial Virus by PCR NEGATIVE NEGATIVE Final    Comment: (NOTE) Fact Sheet for Patients: BloggerCourse.com  Fact Sheet for Healthcare Providers: SeriousBroker.it  This test is not yet approved or cleared by the [place]  FDA and has been authorized for detection and/or diagnosis of SARS-CoV-2 by FDA under an Emergency Use Authorization (EUA). This EUA will remain in effect (meaning this test can be used) for the duration of the COVID-19 declaration under Section 564(b)(1) of the Act, 21 U.S.C. section 360bbb-3(b)(1), unless the authorization is terminated or revoked.  Performed at [facility], [address] [place], [place] [address]   Culture, blood (routine x 2)     Status: None (Preliminary result)   Collection Time: 02/10/24  9:00 AM   Specimen: BLOOD RIGHT HAND  Result Value Ref Range Status   Specimen Description BLOOD RIGHT HAND  Final   Special Requests   Final    BOTTLES DRAWN AEROBIC AND ANAEROBIC Blood Culture results may not be optimal due to an inadequate volume of blood received in culture bottles   Culture   Final    NO GROWTH < 24 HOURS Performed at [facility], [address], [place], [place] [address]    Report Status PENDING  Incomplete  Culture, blood (routine x 2)     Status: None (Preliminary result)   Collection Time: 02/10/24  8:28 PM   Specimen: BLOOD RIGHT HAND  Result Value Ref Range Status  Specimen  Description BLOOD RIGHT HAND  Final   Special Requests   Final    BOTTLES DRAWN AEROBIC AND ANAEROBIC Blood Culture results may not be optimal due to an inadequate volume of blood received in culture bottles   Culture   Final    NO GROWTH < 12 HOURS Performed at [facility], [address] [place], [place] [address]    Report Status PENDING  Incomplete     Radiology Studies: No results found.   Nilda Fendt, MD, PhD [facility]  Between 7 am - 7 pm I am available, please contact me via Amion (for emergencies) or Securechat (non urgent messages)  Between 7 pm - 7 am I am not available, please contact night coverage MD/APP via Amion

## 2024-02-11 NOTE — Consult Note (Signed)
 Advanced Heart Failure Team Consult Note   Primary Physician: Pcp, No Cardiologist:  Annabella Scarce, MD  Reason for Consultation: acute on chronic systolic heart failure w/ Cardiogenic Shock   HPI:    Joseph Tanner is seen today for evaluation of acute on chronic systolic heart failure w/ cardiogenic shock at the request of Dr. Trixie, Internal medicine.   73 y.o. male with history of HFrEF, CAD, COPD, HTN, HLD, hx cocaine abuse, tobacco use, recurrent UTI.    Diagnosed with HF in 2019 while living in [place] . In 03/22 he was admitted with decompensated HF.  Echo with LVEF < 25%. LHC with severe stenosis small Lcx and mild moderate stenosis RCA. EF felt out of proportion to degree of CAD. CAD treated medically.    EF has been in 25-30% range, most recently < 20% on echo 04/24. ICD had been offered in the past but he declined.   He was admitted for acute on chronic CHF 06/14/23 in the setting of poor med compliance and dietary indiscretion w/ sodium. Echo with EF < 15%, mildly reduced RV. He was also treated for UTI and AKI.  UDS + for cocaine. Seen in [facility] clinic for post hospital f/u and deemed not a candidate for advanced therapies. He was instructed to return back to gen cards for ongoing management but per chart review had failed to f/u.   Now readmitted w/ recurrent a/c CHF in setting of ongoing cocaine use and poor med compliance. P/w low output HF symptoms, exertional fatigue/ decreased exercise tolerance, GI symptoms w/ n/v and cardiac cachexia.   BNP > 45000, Initial Lactic acid 5.8 further increasing to > 9.0, admit SCr 2.95 (prior b/l 2.3) further increased to 3.28 today, AST 48, ALT nl, T bilil 5.8. UDS + for cocaine. Echo EF 20-25%, RV severely reduced, IVC dilated.   IM also treating for UTI and possible CAP.    Echo 02/11/24 1. Left ventricular ejection fraction, by estimation, is 20 to 25%. The  left ventricle has severely decreased function. The left  ventricle  demonstrates global hypokinesis. The left ventricular internal cavity size  was severely dilated. There is mild  concentric left ventricular hypertrophy. Left ventricular diastolic  parameters are consistent with Grade I diastolic dysfunction (impaired  relaxation).   2. Right ventricular systolic function is severely reduced. The right  ventricular size is moderately enlarged. There is moderately elevated  pulmonary artery systolic pressure.   3. Left atrial size was moderately dilated.   4. Right atrial size was severely dilated.   5. Large pleural effusion in the left lateral region.   6. The mitral valve is normal in structure. Trivial mitral valve  regurgitation. No evidence of mitral stenosis.   7. The aortic valve is tricuspid. Aortic valve regurgitation is not  visualized. No aortic stenosis is present.   8. Aortic dilatation noted. There is mild dilatation of the ascending  aorta, measuring 41 mm.   9. The inferior vena cava is dilated in size with <50% respiratory  variability, suggesting right atrial pressure of 15 mmHg.   Home Medications Prior to Admission medications   Medication Sig Start Date End Date Taking? Authorizing Provider  albuterol  (VENTOLIN  HFA) 108 (90 Base) MCG/ACT inhaler Inhale 2 puffs into the lungs every 6 (six) hours as needed for wheezing or shortness of breath.   Yes [provider]  atorvastatin  (LIPITOR) 40 MG tablet Take 1 tablet (40 mg total) by mouth every evening. 01/29/23  Yes Lucien, Tessa N, PA-C  carvedilol  (COREG ) 3.125 MG tablet Take 3.125 mg by mouth daily.   Yes [provider]  ibuprofen (ADVIL) 800 MG tablet Take 1,600 mg by mouth as needed.   Yes [provider]  isosorbide-hydrALAZINE  (BIDIL) 20-37.5 MG tablet Take 0.5 tablets by mouth 3 (three) times daily. Patient taking differently: Take 0.5 tablets by mouth in the morning. 06/25/23  Yes Colletta Manuelita Garre, PA-C  Multiple Vitamin (MULTIVITAMIN  WITH MINERALS) TABS tablet Take 1 tablet by mouth daily. 11/25/20  Yes Jadine Toribio SQUIBB, MD  sacubitril -valsartan  (ENTRESTO ) 24-26 MG Take 1 tablet by mouth in the morning.   Yes [provider]  spironolactone  (ALDACTONE ) 25 MG tablet Take 25 mg by mouth daily.   Yes [provider]    Past Medical History: Past Medical History:  Diagnosis Date   COPD (chronic obstructive pulmonary disease) (HCC)    Hyperlipemia    Hypertension    NSTEMI (non-ST elevated myocardial infarction) (HCC)    Pure hypercholesterolemia 03/15/2021    Past Surgical History: No past surgical history on file.  Family History: Family History  Problem Relation Age of Onset   Heart failure Mother    Heart failure Maternal Grandmother    Heart failure Maternal Grandfather    Heart failure Paternal Grandmother    Heart failure Paternal Grandfather     Social History: Social History   Socioeconomic History   Marital status: Divorced    Spouse name: Not on file   Number of children: 5   Years of education: Not on file   Highest education level: Associate degree: academic program  Occupational History   Occupation: Retired  Tobacco Use   Smoking status: Some Days    Types: Cigarettes   Smokeless tobacco: Never  Vaping Use   Vaping status: Never Used  Substance and Sexual Activity   Alcohol use: Not Currently   Drug use: Yes    Types: Cocaine    Comment: 6 months   Sexual activity: Not on file  Other Topics Concern   Not on file  Social History Narrative   Not on file   Social Drivers of Health   Financial Resource Strain: Low Risk  (06/17/2023)   Overall Financial Resource Strain (CARDIA)    Difficulty of Paying Living Expenses: Not very hard  Food Insecurity: No Food Insecurity (02/10/2024)   Hunger Vital Sign    Worried About Running Out of Food in the Last Year: Never true    Ran Out of Food in the Last Year: Never true  Transportation Needs: No Transportation Needs  (02/10/2024)   PRAPARE - Administrator, Civil Service (Medical): No    Lack of Transportation (Non-Medical): No  Physical Activity: Not on file  Stress: Not on file  Social Connections: Socially Isolated (02/10/2024)   Social Connection and Isolation Panel    Frequency of Communication with Friends and Family: Once a week    Frequency of Social Gatherings with Friends and Family: Once a week    Attends Religious Services: Never    Database administrator or Organizations: No    Attends Engineer, structural: Never    Marital Status: Separated    Allergies:  No Known Allergies  Objective:    Vital Signs:   Temp:  [97.3 F (36.3 C)-98.9 F (37.2 C)] 98.9 F (37.2 C) (07/16 0858) Pulse Rate:  [85-94] 85 (07/16 0858) Resp:  [17-18] 17 (07/16 0858) BP: (116-148)/(79-96)  116/79 (07/16 0858) SpO2:  [100 %] 100 % (07/16 0858) Weight:  [69 kg] 69 kg (07/16 0634) Last BM Date : 02/10/24  Weight change: Filed Weights   02/10/24 0844 02/11/24 0634  Weight: 80.7 kg 69 kg    Intake/Output:   Intake/Output Summary (Last 24 hours) at 02/11/2024 1527 Last data filed at 02/11/2024 0135 Gross per 24 hour  Intake 129.65 ml  Output 260 ml  Net -130.35 ml      Physical Exam    General:  chronically ill appearing w/ cardiac cachexia. No resp difficulty HEENT: normal Neck: supple. JVP elevated to jaw . Carotids 2+ bilat; no bruits. No lymphadenopathy or thyromegaly appreciated. Cor:  Regular rate & rhythm. No rubs, gallops or murmurs. Lungs: decreased BS at the bases bilaterally  Abdomen: soft, nontender, nondistended. Extremities: no cyanosis, clubbing, rash, 1-1+ b/l LE edema   Telemetry   NSR w/ PVCs, 80s. Personally reviewed   EKG    NSR w/ LBBB, 98 bpm. QRS 122 ms  Labs   Basic Metabolic Panel: Recent Labs  Lab 02/10/24 0900 02/11/24 0326  NA 136 139  K 5.6* 5.9*  CL 108 109  CO2 15* 11*  GLUCOSE 115* 103*  BUN 36* 39*  CREATININE 2.95*  3.28*  CALCIUM  9.0 9.2  MG 1.6*  --   PHOS 2.8  --     Liver Function Tests: Recent Labs  Lab 02/10/24 0900  AST 48*  ALT 33  ALKPHOS 57  BILITOT 5.8*  PROT 6.9  ALBUMIN 2.8*   No results for input(s): LIPASE, AMYLASE in the last 168 hours. No results for input(s): AMMONIA in the last 168 hours.  CBC: Recent Labs  Lab 02/10/24 0900 02/11/24 0326  WBC 4.3 5.3  NEUTROABS 3.8  --   HGB 8.8* 8.2*  HCT 29.5* 27.3*  MCV 78.0* 77.6*  PLT 96* 86*    Cardiac Enzymes: No results for input(s): CKTOTAL, CKMB, CKMBINDEX, TROPONINI in the last 168 hours.  BNP: BNP (last 3 results) Recent Labs    06/14/23 2358 06/24/23 1511 02/10/24 2028  BNP 2,436.6* >4,500.0* >4,500.0*    ProBNP (last 3 results) No results for input(s): PROBNP in the last 8760 hours.   CBG: No results for input(s): GLUCAP in the last 168 hours.  Coagulation Studies: No results for input(s): LABPROT, INR in the last 72 hours.   Imaging   US  EKG SITE RITE Result Date: 02/11/2024 If Site Rite image not attached, placement could not be confirmed due to current cardiac rhythm.  ECHOCARDIOGRAM COMPLETE Result Date: 02/11/2024    ECHOCARDIOGRAM REPORT   Patient Name:   Johnrobert DELAINE [facility] Date of Exam: 02/11/2024 Medical Rec #:  [id number]               Height:       75.0 in Accession #:    [id number]              Weight:       152.1 lb Date of Birth:  May 12, 1951                BSA:          1.953 m Patient Age:    73 years                BP:           116/79 mmHg Patient Gender: M  HR:           85 bpm. Exam Location:  Inpatient Procedure: 2D Echo, Cardiac Doppler and Color Doppler (Both Spectral and Color            Flow Doppler were utilized during procedure). Indications:    I50.40* Unspecified combined systolic (congestive) and diastolic                 (congestive) heart failure  History:        Patient has prior history of Echocardiogram examinations, most                  recent 06/15/2023. CHF; CAD and Previous Myocardial Infarction.                 Cocaine.  Sonographer:    Ellouise Mose RDCS Referring Phys: [id number] RONDELL A SMITH IMPRESSIONS  1. Left ventricular ejection fraction, by estimation, is 20 to 25%. The left ventricle has severely decreased function. The left ventricle demonstrates global hypokinesis. The left ventricular internal cavity size was severely dilated. There is mild concentric left ventricular hypertrophy. Left ventricular diastolic parameters are consistent with Grade I diastolic dysfunction (impaired relaxation).  2. Right ventricular systolic function is severely reduced. The right ventricular size is moderately enlarged. There is moderately elevated pulmonary artery systolic pressure.  3. Left atrial size was moderately dilated.  4. Right atrial size was severely dilated.  5. Large pleural effusion in the left lateral region.  6. The mitral valve is normal in structure. Trivial mitral valve regurgitation. No evidence of mitral stenosis.  7. The aortic valve is tricuspid. Aortic valve regurgitation is not visualized. No aortic stenosis is present.  8. Aortic dilatation noted. There is mild dilatation of the ascending aorta, measuring 41 mm.  9. The inferior vena cava is dilated in size with <50% respiratory variability, suggesting right atrial pressure of 15 mmHg. Conclusion(s)/Recommendation(s): No left ventricular mural or apical thrombus/thrombi. FINDINGS  Left Ventricle: Left ventricular ejection fraction, by estimation, is 20 to 25%. The left ventricle has severely decreased function. The left ventricle demonstrates global hypokinesis. The left ventricular internal cavity size was severely dilated. There is mild concentric left ventricular hypertrophy. Left ventricular diastolic parameters are consistent with Grade I diastolic dysfunction (impaired relaxation). Right Ventricle: The right ventricular size is moderately enlarged. No increase  in right ventricular wall thickness. Right ventricular systolic function is severely reduced. There is moderately elevated pulmonary artery systolic pressure. The tricuspid regurgitant velocity is 2.78 m/s, and with an assumed right atrial pressure of 15 mmHg, the estimated right ventricular systolic pressure is 45.9 mmHg. Left Atrium: Left atrial size was moderately dilated. Right Atrium: Right atrial size was severely dilated. Pericardium: There is no evidence of pericardial effusion. Mitral Valve: The mitral valve is normal in structure. Trivial mitral valve regurgitation. No evidence of mitral valve stenosis. Tricuspid Valve: The tricuspid valve is normal in structure. Tricuspid valve regurgitation is mild . No evidence of tricuspid stenosis. Aortic Valve: The aortic valve is tricuspid. Aortic valve regurgitation is not visualized. No aortic stenosis is present. Pulmonic Valve: The pulmonic valve was normal in structure. Pulmonic valve regurgitation is mild. No evidence of pulmonic stenosis. Aorta: Aortic dilatation noted. There is mild dilatation of the ascending aorta, measuring 41 mm. Venous: The inferior vena cava is dilated in size with less than 50% respiratory variability, suggesting right atrial pressure of 15 mmHg. IAS/Shunts: No atrial level shunt detected by color flow Doppler. Additional Comments: There is  a large pleural effusion in the left lateral region.  LEFT VENTRICLE PLAX 2D LVIDd:         6.60 cm      Diastology LVIDs:         6.30 cm      LV e' medial:    4.57 cm/s LV PW:         1.20 cm      LV E/e' medial:  19.5 LV IVS:        1.00 cm      LV e' lateral:   10.00 cm/s LVOT diam:     2.40 cm      LV E/e' lateral: 8.9 LV SV:         67 LV SV Index:   34 LVOT Area:     4.52 cm  LV Volumes (MOD) LV vol d, MOD A2C: 273.5 ml LV vol d, MOD A4C: 225.7 ml LV vol s, MOD A2C: 214.5 ml LV vol s, MOD A4C: 177.3 ml LV SV MOD A2C:     59.0 ml LV SV MOD A4C:     225.7 ml LV SV MOD BP:      52.8 ml RIGHT  VENTRICLE            IVC RV S prime:     7.83 cm/s  IVC diam: 2.50 cm TAPSE (M-mode): 1.6 cm LEFT ATRIUM             Index        RIGHT ATRIUM           Index LA diam:        4.30 cm 2.20 cm/m   RA Area:     28.50 cm LA Vol (A2C):   93.9 ml 48.07 ml/m  RA Volume:   105.00 ml 53.75 ml/m LA Vol (A4C):   74.7 ml 38.24 ml/m LA Biplane Vol: 87.2 ml 44.64 ml/m  AORTIC VALVE LVOT Vmax:   93.80 cm/s LVOT Vmean:  62.400 cm/s LVOT VTI:    0.148 m  AORTA Ao Root diam: 3.50 cm Ao Asc diam:  4.15 cm MITRAL VALVE               TRICUSPID VALVE MV Area (PHT): 4.39 cm    TR Peak grad:   30.9 mmHg MV Decel Time: 173 msec    TR Vmax:        278.00 cm/s MV E velocity: 89.10 cm/s                            SHUNTS                            Systemic VTI:  0.15 m                            Systemic Diam: 2.40 cm Kardie Tobb DO Electronically signed by Kardie Tobb DO Signature Date/Time: 02/11/2024/2:38:48 PM    Final      Medications:     Current Medications:  acetaminophen   1,000 mg Oral Once   atorvastatin   40 mg Oral QPM   azithromycin   500 mg Oral Daily   furosemide   40 mg Intravenous BID   guaiFENesin   600 mg Oral BID   sodium bicarbonate   650 mg Oral TID   sodium chloride  flush  3 mL Intravenous Q12H  Infusions:  cefTRIAXone  (ROCEPHIN )  IV 2 g (02/11/24 1350)   DOBUTamine         Patient Profile   73 y.o. male with history of chronic systolic heart failure, CAD, COPD, CKD IV, HTN, HLD, hx cocaine abuse, tobacco use, recurrent UTIs and poor compliance, admitted w/ a/c systolic heart failure w/ low-output.   Assessment/Plan   1. Acute on Chronic Systolic Heart Failure w/ Cardiogenic Shock  - Diagnosed with HF in 2019 while living in [place]  - NICM. Has CAD but severity of CM out of proportion to degree of CAD - cocaine and ETOH use likely contributing   - EF has been in 25-30% range, most recently < 20% on echo 04/24. Offered ICD but has declined - now admitted w/ NYHA IV symptoms and volume  overload w/ CS, LA > 9.0. Echo w/ Bi-V failure, EF 20-25%, RV severely reduced  - suspect end-stage HF, w/ s/o cardiac cachexia and worsening renal failure  - he is not a candidate for advanced therapies given substance use and poor compliance  - will start inotropes for the sole purpose to aide in palliative diuresis  - transfer to PCU. Start DBA 5 mcg/kg/min  - increase IV Lasix  to 80 mg bid  - Place PICC to follow co-ox. CVPs to guide diuresis  - follow LA for clearance  - CKD and soft BP limits additional GDMT currently  - not a candidate for home inotropes given substance use   2. AKI on CKD IV  - b/l SCr 2.5. Up to 3.3 today  - cardiorenal from low output  - support CO w/ DBA - follow w/ diuresis  - no SLGT2i given h/o frequent UTIs   3. Hyperkalemia - K 5.9, in setting of ARF - given lokelma  - increasing lasix  - obtain f/u BMP   4. Polysubstance Use - UDS + for cocaine  - needs to quit   5. ID. ? CAP + UTI  - on abx - management per IM    Length of Stay: 1  Caysen Whang, PA-C  02/11/2024, 3:27 PM    Advanced Heart Failure Team Pager [phone] (M-F; 7a - 5p)  Please contact [facility] for night-coverage after hours (4p -7a ) and weekends on [email or web address]

## 2024-02-12 DIAGNOSIS — R570 Cardiogenic shock: Secondary | ICD-10-CM

## 2024-02-12 DIAGNOSIS — J189 Pneumonia, unspecified organism: Secondary | ICD-10-CM | POA: Diagnosis not present

## 2024-02-12 LAB — CBC
HCT: 25.6 % — ABNORMAL LOW (ref 39.0–52.0)
Hemoglobin: 8.2 g/dL — ABNORMAL LOW (ref 13.0–17.0)
MCH: 23.8 pg — ABNORMAL LOW (ref 26.0–34.0)
MCHC: 32 g/dL (ref 30.0–36.0)
MCV: 74.4 fL — ABNORMAL LOW (ref 80.0–100.0)
Platelets: 90 K/uL — ABNORMAL LOW (ref 150–400)
RBC: 3.44 MIL/uL — ABNORMAL LOW (ref 4.22–5.81)
RDW: 23 % — ABNORMAL HIGH (ref 11.5–15.5)
WBC: 5.6 K/uL (ref 4.0–10.5)
nRBC: 0 % (ref 0.0–0.2)

## 2024-02-12 LAB — LACTIC ACID, PLASMA
Lactic Acid, Venous: 3 mmol/L (ref 0.5–1.9)
Lactic Acid, Venous: 3.1 mmol/L (ref 0.5–1.9)

## 2024-02-12 LAB — COMPREHENSIVE METABOLIC PANEL WITH GFR
ALT: 27 U/L (ref 0–44)
AST: 48 U/L — ABNORMAL HIGH (ref 15–41)
Albumin: 2.2 g/dL — ABNORMAL LOW (ref 3.5–5.0)
Alkaline Phosphatase: 51 U/L (ref 38–126)
Anion gap: 12 (ref 5–15)
BUN: 40 mg/dL — ABNORMAL HIGH (ref 8–23)
CO2: 18 mmol/L — ABNORMAL LOW (ref 22–32)
Calcium: 8.4 mg/dL — ABNORMAL LOW (ref 8.9–10.3)
Chloride: 105 mmol/L (ref 98–111)
Creatinine, Ser: 2.92 mg/dL — ABNORMAL HIGH (ref 0.61–1.24)
GFR, Estimated: 22 mL/min — ABNORMAL LOW (ref >60)
Glucose, Bld: 129 mg/dL — ABNORMAL HIGH (ref 70–99)
Potassium: 3.9 mmol/L (ref 3.5–5.1)
Sodium: 135 mmol/L (ref 135–145)
Total Bilirubin: 3.3 mg/dL — ABNORMAL HIGH (ref 0.0–1.2)
Total Protein: 5.5 g/dL — ABNORMAL LOW (ref 6.5–8.1)

## 2024-02-12 LAB — MAGNESIUM: Magnesium: 1.6 mg/dL — ABNORMAL LOW (ref 1.7–2.4)

## 2024-02-12 LAB — COOXEMETRY PANEL
Carboxyhemoglobin: 2 % — ABNORMAL HIGH (ref 0.5–1.5)
Methemoglobin: <0.7 % (ref 0.0–1.5)
O2 Saturation: 66.6 %
Total hemoglobin: 8.9 g/dL — ABNORMAL LOW (ref 12.0–16.0)

## 2024-02-12 LAB — PHOSPHORUS: Phosphorus: 2.7 mg/dL (ref 2.5–4.6)

## 2024-02-12 MED ORDER — ENSURE PLUS HIGH PROTEIN PO LIQD: 237.0000 mL | Freq: Two times a day (BID) | ORAL | Status: DC

## 2024-02-12 MED ORDER — ENSURE PLUS HIGH PROTEIN PO LIQD
237.0000 mL | Freq: Two times a day (BID) | ORAL | Status: DC
  Administered 2024-02-12: 237 mL via ORAL

## 2024-02-12 MED ORDER — FOLIC ACID 1 MG PO TABS
1.0000 mg | ORAL_TABLET | Freq: Every day | ORAL | Status: DC
  Administered 2024-02-12 – 2024-02-20 (×9): 1 mg via ORAL
  Filled 2024-02-12 (×9): qty 1

## 2024-02-12 MED ORDER — ADULT MULTIVITAMIN W/MINERALS CH
1.0000 | ORAL_TABLET | Freq: Every day | ORAL | Status: DC
  Administered 2024-02-12 – 2024-02-20 (×9): 1 via ORAL
  Filled 2024-02-12 (×9): qty 1

## 2024-02-12 MED ORDER — MAGNESIUM SULFATE 2 GM/50ML IV SOLN
2.0000 g | Freq: Once | INTRAVENOUS | Status: AC
  Administered 2024-02-12: 2 g via INTRAVENOUS
  Filled 2024-02-12: qty 50

## 2024-02-12 MED ORDER — POTASSIUM CHLORIDE CRYS ER 20 MEQ PO TBCR
20.0000 meq | EXTENDED_RELEASE_TABLET | Freq: Once | ORAL | Status: AC
Start: 2024-02-12 — End: 2024-02-12
  Administered 2024-02-12: 20 meq via ORAL
  Filled 2024-02-12: qty 1

## 2024-02-12 MED ORDER — ENSURE PLUS HIGH PROTEIN PO LIQD
237.0000 mL | Freq: Three times a day (TID) | ORAL | Status: DC
  Administered 2024-02-13 – 2024-02-20 (×12): 237 mL via ORAL

## 2024-02-12 MED ORDER — THIAMINE MONONITRATE 100 MG PO TABS
100.0000 mg | ORAL_TABLET | Freq: Every day | ORAL | Status: DC
  Administered 2024-02-12 – 2024-02-20 (×9): 100 mg via ORAL
  Filled 2024-02-12 (×9): qty 1

## 2024-02-12 NOTE — Progress Notes (Signed)
 OT Cancellation Note  Patient Details Name: Joseph Tanner MRN: [id number] DOB: Dec 04, 1950   Cancelled Treatment:    Reason Eval/Treat Not Completed: Patient declined, no reason specified. Pt declined, reporting he is not getting OOB today. Even declining EOB activities. OT will return tomorrow.   Joseph Tanner, OT  Acute Rehabilitation Services Office [phone] Secure chat preferred   Joseph Tanner Savers 02/12/2024, 3:55 PM

## 2024-02-12 NOTE — Plan of Care (Signed)

## 2024-02-12 NOTE — Progress Notes (Signed)
 OT Cancellation Note  Patient Details Name: Joseph Tanner MRN: [id number] DOB: 14-Dec-1950   Cancelled Treatment:    Reason Eval/Treat Not Completed: Other (comment) (Per nursing asked to come back) [name] POUR OTR/L  Acute Rehab Services  [phone] office number   [name] 02/12/2024, 10:43 AM

## 2024-02-12 NOTE — Progress Notes (Addendum)
 Advanced Heart Failure Rounding Note  Cardiologist: Annabella Scarce, MD   Chief Complaint: Cardiogenic shock  Subjective:    Lactic acid > 9 >>7.6 yesterday afternoon. Has not started DBA yet, transferring to cardiac progressive for inotrope support.  3200 cc UOP last 24 hrs with IV lasix .   Scr peaked at 3.3, slightly better 2.9 today.  Feels a little better. Reports great UOP and appetite starting to improve. No dyspnea at rest.   Objective:   Weight Range: 69 kg Body mass index is 19.01 kg/m.   Vital Signs:   Temp:  [97.6 F (36.4 C)-98.9 F (37.2 C)] 97.8 F (36.6 C) (07/17 0522) Pulse Rate:  [78-87] 86 (07/17 0522) Resp:  [16-18] 18 (07/17 0522) BP: (102-116)/(64-81) 113/81 (07/17 0522) SpO2:  [100 %] 100 % (07/17 0522) Last BM Date : 02/11/24  Weight change: Filed Weights   02/10/24 0844 02/11/24 0634  Weight: 80.7 kg 69 kg    Intake/Output:   Intake/Output Summary (Last 24 hours) at 02/12/2024 0746 Last data filed at 02/11/2024 2206 Gross per 24 hour  Intake --  Output 3200 ml  Net -3200 ml      Physical Exam    General:  Cachectic, chronically ill appearing Neck: JVP ~ 12 Cor: Regular rate & rhythm. No rubs, gallops or murmurs. Lungs: Clear Abdomen: Soft, nontender, nondistended.  Extremities: Trace edema Neuro: Alert & orientedx3. Affect flat.   Telemetry   SR 70s-80s, ~ 10 PVCs/min   Labs    CBC Recent Labs    02/10/24 0900 02/11/24 0326  WBC 4.3 5.3  NEUTROABS 3.8  --   HGB 8.8* 8.2*  HCT 29.5* 27.3*  MCV 78.0* 77.6*  PLT 96* 86*   Basic Metabolic Panel Recent Labs    92/84/74 0900 02/11/24 0326 02/11/24 1508 02/12/24 0240  NA 136 139  --  135  K 5.6* 5.9* 4.4 3.9  CL 108 109  --  105  CO2 15* 11*  --  18*  GLUCOSE 115* 103*  --  129*  BUN 36* 39*  --  40*  CREATININE 2.95* 3.28*  --  2.92*  CALCIUM  9.0 9.2  --  8.4*  MG 1.6*  --   --  1.6*  PHOS 2.8  --   --  2.7   Liver Function Tests Recent Labs     02/10/24 0900 02/12/24 0240  AST 48* 48*  ALT 33 27  ALKPHOS 57 51  BILITOT 5.8* 3.3*  PROT 6.9 5.5*  ALBUMIN 2.8* 2.2*   No results for input(s): LIPASE, AMYLASE in the last 72 hours. Cardiac Enzymes No results for input(s): CKTOTAL, CKMB, CKMBINDEX, TROPONINI in the last 72 hours.  BNP: BNP (last 3 results) Recent Labs    06/14/23 2358 06/24/23 1511 02/10/24 2028  BNP 2,436.6* >4,500.0* >4,500.0*    ProBNP (last 3 results) No results for input(s): PROBNP in the last 8760 hours.   D-Dimer No results for input(s): DDIMER in the last 72 hours. Hemoglobin A1C No results for input(s): HGBA1C in the last 72 hours. Fasting Lipid Panel No results for input(s): CHOL, HDL, LDLCALC, TRIG, CHOLHDL, LDLDIRECT in the last 72 hours. Thyroid Function Tests No results for input(s): TSH, T4TOTAL, T3FREE, THYROIDAB in the last 72 hours.  Invalid input(s): FREET3  Other results:   Imaging    US  EKG SITE RITE Result Date: 02/11/2024 If Site Rite image not attached, placement could not be confirmed due to current cardiac rhythm.  ECHOCARDIOGRAM COMPLETE  Result Date: 02/11/2024    ECHOCARDIOGRAM REPORT   Patient Name:   Mahmood DELAINE [facility] Date of Exam: 02/11/2024 Medical Rec #:  [id number]               Height:       75.0 in Accession #:    [id number]              Weight:       152.1 lb Date of Birth:  1951/03/09                BSA:          1.953 m Patient Age:    73 years                BP:           116/79 mmHg Patient Gender: M                       HR:           85 bpm. Exam Location:  Inpatient Procedure: 2D Echo, Cardiac Doppler and Color Doppler (Both Spectral and Color            Flow Doppler were utilized during procedure). Indications:    I50.40* Unspecified combined systolic (congestive) and diastolic                 (congestive) heart failure  History:        Patient has prior history of Echocardiogram examinations, most                  recent 06/15/2023. CHF; CAD and Previous Myocardial Infarction.                 Cocaine.  Sonographer:    Ellouise Mose RDCS Referring Phys: [id number] RONDELL A SMITH IMPRESSIONS  1. Left ventricular ejection fraction, by estimation, is 20 to 25%. The left ventricle has severely decreased function. The left ventricle demonstrates global hypokinesis. The left ventricular internal cavity size was severely dilated. There is mild concentric left ventricular hypertrophy. Left ventricular diastolic parameters are consistent with Grade I diastolic dysfunction (impaired relaxation).  2. Right ventricular systolic function is severely reduced. The right ventricular size is moderately enlarged. There is moderately elevated pulmonary artery systolic pressure.  3. Left atrial size was moderately dilated.  4. Right atrial size was severely dilated.  5. Large pleural effusion in the left lateral region.  6. The mitral valve is normal in structure. Trivial mitral valve regurgitation. No evidence of mitral stenosis.  7. The aortic valve is tricuspid. Aortic valve regurgitation is not visualized. No aortic stenosis is present.  8. Aortic dilatation noted. There is mild dilatation of the ascending aorta, measuring 41 mm.  9. The inferior vena cava is dilated in size with <50% respiratory variability, suggesting right atrial pressure of 15 mmHg. Conclusion(s)/Recommendation(s): No left ventricular mural or apical thrombus/thrombi. FINDINGS  Left Ventricle: Left ventricular ejection fraction, by estimation, is 20 to 25%. The left ventricle has severely decreased function. The left ventricle demonstrates global hypokinesis. The left ventricular internal cavity size was severely dilated. There is mild concentric left ventricular hypertrophy. Left ventricular diastolic parameters are consistent with Grade I diastolic dysfunction (impaired relaxation). Right Ventricle: The right ventricular size is moderately enlarged. No increase in right  ventricular wall thickness. Right ventricular systolic function is severely reduced. There is moderately elevated pulmonary artery systolic pressure. The tricuspid regurgitant velocity is 2.78 m/s, and  with an assumed right atrial pressure of 15 mmHg, the estimated right ventricular systolic pressure is 45.9 mmHg. Left Atrium: Left atrial size was moderately dilated. Right Atrium: Right atrial size was severely dilated. Pericardium: There is no evidence of pericardial effusion. Mitral Valve: The mitral valve is normal in structure. Trivial mitral valve regurgitation. No evidence of mitral valve stenosis. Tricuspid Valve: The tricuspid valve is normal in structure. Tricuspid valve regurgitation is mild . No evidence of tricuspid stenosis. Aortic Valve: The aortic valve is tricuspid. Aortic valve regurgitation is not visualized. No aortic stenosis is present. Pulmonic Valve: The pulmonic valve was normal in structure. Pulmonic valve regurgitation is mild. No evidence of pulmonic stenosis. Aorta: Aortic dilatation noted. There is mild dilatation of the ascending aorta, measuring 41 mm. Venous: The inferior vena cava is dilated in size with less than 50% respiratory variability, suggesting right atrial pressure of 15 mmHg. IAS/Shunts: No atrial level shunt detected by color flow Doppler. Additional Comments: There is a large pleural effusion in the left lateral region.  LEFT VENTRICLE PLAX 2D LVIDd:         6.60 cm      Diastology LVIDs:         6.30 cm      LV e' medial:    4.57 cm/s LV PW:         1.20 cm      LV E/e' medial:  19.5 LV IVS:        1.00 cm      LV e' lateral:   10.00 cm/s LVOT diam:     2.40 cm      LV E/e' lateral: 8.9 LV SV:         67 LV SV Index:   34 LVOT Area:     4.52 cm  LV Volumes (MOD) LV vol d, MOD A2C: 273.5 ml LV vol d, MOD A4C: 225.7 ml LV vol s, MOD A2C: 214.5 ml LV vol s, MOD A4C: 177.3 ml LV SV MOD A2C:     59.0 ml LV SV MOD A4C:     225.7 ml LV SV MOD BP:      52.8 ml RIGHT VENTRICLE             IVC RV S prime:     7.83 cm/s  IVC diam: 2.50 cm TAPSE (M-mode): 1.6 cm LEFT ATRIUM             Index        RIGHT ATRIUM           Index LA diam:        4.30 cm 2.20 cm/m   RA Area:     28.50 cm LA Vol (A2C):   93.9 ml 48.07 ml/m  RA Volume:   105.00 ml 53.75 ml/m LA Vol (A4C):   74.7 ml 38.24 ml/m LA Biplane Vol: 87.2 ml 44.64 ml/m  AORTIC VALVE LVOT Vmax:   93.80 cm/s LVOT Vmean:  62.400 cm/s LVOT VTI:    0.148 m  AORTA Ao Root diam: 3.50 cm Ao Asc diam:  4.15 cm MITRAL VALVE               TRICUSPID VALVE MV Area (PHT): 4.39 cm    TR Peak grad:   30.9 mmHg MV Decel Time: 173 msec    TR Vmax:        278.00 cm/s MV E velocity: 89.10 cm/s  SHUNTS                            Systemic VTI:  0.15 m                            Systemic Diam: 2.40 cm Kardie Tobb DO Electronically signed by Kardie Tobb DO Signature Date/Time: 02/11/2024/2:38:48 PM    Final      Medications:     Scheduled Medications:  acetaminophen   1,000 mg Oral Once   atorvastatin   40 mg Oral QPM   azithromycin   500 mg Oral Daily   Chlorhexidine  Gluconate Cloth  6 each Topical Daily   furosemide   80 mg Intravenous BID   guaiFENesin   600 mg Oral BID   sodium bicarbonate   650 mg Oral TID   sodium chloride  flush  10-40 mL Intracatheter Q12H   sodium chloride  flush  3 mL Intravenous Q12H    Infusions:  cefTRIAXone  (ROCEPHIN )  IV 2 g (02/11/24 1350)   DOBUTamine       PRN Medications: acetaminophen  **OR** acetaminophen , albuterol , melatonin, ondansetron  **OR** ondansetron  (ZOFRAN ) IV, sodium chloride  flush    Patient Profile  73 y.o. male with history of chronic systolic heart failure, CAD, COPD, CKD IV, HTN, HLD, hx cocaine abuse, tobacco use, recurrent UTIs and poor compliance, admitted w/ a/c systolic heart failure w/ low-output.   Assessment/Plan   1. Acute on Chronic Systolic Heart Failure w/ Cardiogenic Shock  - Diagnosed with HF in 2019 while living in [place]  - NICM. Has CAD  but severity of CM out of proportion to degree of CAD - cocaine and ETOH use likely contributing   - EF has been in 25-30% range, most recently < 20% on echo 04/24. Offered ICD but has declined - now admitted w/ NYHA IV symptoms and volume overload w/ CS, LA > 9.0. Echo w/ Bi-V failure, EF 20-25%, RV severely reduced  - suspect end-stage HF, cardiac cachexia and worsening renal failure  - he is not a candidate for advanced therapies given substance use and poor compliance  - will start inotropes for the sole purpose to aide in palliative diuresis  - Transferring to progressive care for inotrope support. Has orders for DBA at 5 mcg/kg/min. Trend lactic acid. - Diuresis has picked up, continue IV lasix  80 BID - CKD and soft BP limits additional GDMT currently  - not a candidate for home inotropes given substance use  - Palliative care consult for GOC discussions  2. PVCs - Frequent on telemetry - Follow burden with addition of DBA. May need to add amiodarone. - Supp K and mag. Mag 1.6 today.   3. AKI on CKD IV  - b/l SCr 2.5. Peaked at 3.3, now imporving - cardiorenal from low output  - support CO w/ DBA - follow w/ diuresis  - no SLGT2i given h/o frequent UTIs    4. Hyperkalemia - In setting of ARF - given lokelma  - Now resolved   5. Polysubstance Use - UDS + for cocaine  - needs to quit    6. ID. ? CAP + UTI  - No leukocytosis and Afebrile. Procal 0.59 - on abx - management per IM. Primary team considering stopping abx.    Length of Stay: 2  FINCH, LINDSAY N, PA-C  02/12/2024, 7:46 AM  Advanced Heart Failure Team Pager [phone] (M-F; 7a - 5p)  Please contact [facility] for night-coverage  after hours (5p -7a ) and weekends on [email or web address]  Patient seen and examined with the above-signed Advanced Practice Provider and/or Housestaff. I personally reviewed laboratory data, imaging studies and relevant notes. I independently examined the patient and formulated the  important aspects of the plan. I have edited the note to reflect any of my changes or salient points. I have personally discussed the plan with the patient and/or family.  73 y/o male with polysubstance abuse, CKD IV admitted with low output HF -> cardiogenic shock with lactate 9  DBA ordered but had not been started yet.   Starting to feel better. Lactate coming down. Responding to lasix . Breathing improved.   General:  Thin male sitting up in bed No resp difficulty HEENT: normal Neck: supple. CVP 10.  Cor: RRR + s3  Lungs: clear Abdomen: soft, nontender, nondistended. No hepatosplenomegaly. No bruits or masses. Good bowel sounds. Extremities: no cyanosis, clubbing, rash, tr edema Neuro: alert & orientedx3, cranial nerves grossly intact. moves all 4 extremities w/o difficulty. Affect pleasant  Recurrent HF in setting of ongoing substance abuse. He had profound lactic acidosis on admit but now seems to be recovering. Will start dobutamine  and adjust GDMT as able. Unfortunately not a candidate for advance therapies due to ongoing substance abuse  Toribio Fuel, MD  4:47 PM

## 2024-02-12 NOTE — Progress Notes (Signed)
 Initial Nutrition Assessment  DOCUMENTATION CODES:   Severe malnutrition in context of social or environmental circumstances, Underweight  INTERVENTION:  -Liberalize diet to 2 g Na menu, regular texture, thin liquids -Add Ensure Plus High Protein TID -Add MVI w/ min, Thiamin, Folic acid  -Pt is AT RISK for refeeding syndrome, continue to monitor and replace electrolytes daily  NUTRITION DIAGNOSIS:   Severe Malnutrition related to social / environmental circumstances, poor appetite, acute illness as evidenced by moderate fat depletion, severe muscle depletion, energy intake < or equal to 50% for > or equal to 1 month, per patient/family report.  GOAL:   Patient will meet greater than or equal to 90% of their needs, Weight gain   MONITOR:   PO intake, Weight trends, Supplement acceptance, Labs, Skin  REASON FOR ASSESSMENT:   Malnutrition Screening Tool    ASSESSMENT:   Hx HTN, HLD, combined systolic and diastolic CHF, NSTEMI, CAD s/p PCI, COPD, CKD stage IV, and cocaine abuse. Admit from SOB, dx CAP.  Spoke to pt at bedside. Pt denies n/v/c or chewing/swallowing difficulties at this time. Pt does have noted diarrhea. Pt endorses weight loss and poor PO intake prior to admission, though, conflicting amounts stated. Suspect appetite suppression with cocaine use. 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 Pt agreeable to EPHP TID to promote adequate PO intake. Encouraged pt to increase PO intake, discussed importance of adequate intake. Pt verbalized understanding. Will continue to monitor, RDN available prn.    Labs BG 129 BUN 40 Cr 2.92 Calcium  8.4 Mg 1.6 Albumin 2.2 AST 48 GFR 22 H/H 8.2/25.6  Medications Atorvastatin  Azithromycin  Lasix  Mg sulfate IV Potassium Cl   NUTRITION - FOCUSED PHYSICAL EXAM:  Flowsheet Row Most Recent Value  Orbital Region Moderate depletion  Upper Arm Region Moderate depletion  Thoracic and  Lumbar Region Moderate depletion  Buccal Region Moderate depletion  Temple Region Severe depletion  Clavicle Bone Region Severe depletion  Clavicle and Acromion Bone Region Severe depletion  Scapular Bone Region Moderate depletion  Dorsal Hand Moderate depletion  Patellar Region Moderate depletion  Anterior Thigh Region Moderate depletion  Posterior Calf Region Moderate depletion  Edema (RD Assessment) Mild  Hair Reviewed  Eyes Reviewed  Mouth Reviewed  Skin Reviewed  Nails Reviewed    Diet Order:   Diet Order             Diet Heart Room service appropriate? Yes; Fluid consistency: Thin  Diet effective now                   EDUCATION NEEDS:   Education needs have been addressed  Skin:  Skin Assessment: Reviewed RN Assessment  Last BM:  7/16  Height:   Ht Readings from Last 1 Encounters:  02/10/24 6' 3 (1.905 m)    Weight:   Wt Readings from Last 1 Encounters:  02/12/24 64.3 kg    BMI:  Body mass index is 17.72 kg/m.  Estimated Nutritional Needs:   Kcal:  1925-2250 kcal  Protein:  75-100g  Fluid:  >/=2L    Annelyse Rey Daml-Budig, RDN, LDN Registered Dietitian Nutritionist RD Inpatient Contact Info in [place]

## 2024-02-12 NOTE — TOC Progression Note (Signed)
 Transition of Care [facility]) - Progression Note    Patient Details  Name: Joseph Tanner MRN: [id number] Date of Birth: [birth year]  [facility] CM/SW Contact  [name], [place] Phone Number: 02/12/2024, 11:51 AM  Clinical Narrative:   CSW met patient at bedside and introduced self. CSW spoke with patient about substance use consult. CSW provided patient with resources to address sub use.   TOC will continue to follow.           Expected Discharge Plan and Services                                               Social Determinants of Health (SDOH) Interventions SDOH Screenings   Food Insecurity: No Food Insecurity (02/10/2024)  Housing: Low Risk  (02/10/2024)  Transportation Needs: No Transportation Needs (02/10/2024)  Utilities: Not At Risk (02/10/2024)  Alcohol Screen: Low Risk  (06/17/2023)  Financial Resource Strain: Low Risk  (06/17/2023)  Social Connections: Socially Isolated (02/10/2024)  Tobacco Use: High Risk (06/24/2023)    Readmission Risk Interventions    02/11/2024   11:47 AM  Readmission Risk Prevention Plan  Transportation Screening Complete  PCP or Specialist Appt within 5-7 Days Complete  Home Care Screening Complete  Medication Review (RN CM) Complete

## 2024-02-12 NOTE — Progress Notes (Signed)
 Physical Therapy Treatment Patient Details Name: Joseph Tanner MRN: [id number] DOB: Dec 14, 1950 Today's Date: 02/12/2024   History of Present Illness 73 y.o. male [facility]/10/24 with SOB. Chest x-ray concerning for aspiration or PNA, +UTI, + cocaine. PMHx: hypertension, hyperlipidemia, combined systolic and diastolic CHF, NSTEMI, CAD s/p PCI, COPD, CKD stage IV, and cocaine abuse    PT Comments  Received pt semi-reclined in bed reporting fatigue. RN arrived to transport pt to 3E but pt requesting to change out of soiled gown. Pt performed bed mobility with supervision using bed features and stood from EOB without AD and close supervision. Removed soiled gown and donned clean one standing with assist. Pt then voided in urinal while standing and RN/NT changed bed linens. Pt left sitting EOB in care of RN to be transported. Current HHPT recommendations remain appropriate. Acute PT to cont to follow.     If plan is discharge home, recommend the following: A little help with walking and/or transfers;Assist for transportation;Help with stairs or ramp for entrance;A little help with bathing/dressing/bathroom;Assistance with cooking/housework   Can travel by private vehicle        Equipment Recommendations  Other (comment) (TBD SPC)    Recommendations for Other Services       Precautions / Restrictions Precautions Precautions: Fall Restrictions Weight Bearing Restrictions Per Provider Order: No     Mobility  Bed Mobility Overal bed mobility: Needs Assistance Bed Mobility: Supine to Sit     Supine to sit: Supervision, HOB elevated, Used rails     General bed mobility comments: increased time but no physical assist required Patient Response: Flat affect  Transfers Overall transfer level: Needs assistance Equipment used: None Transfers: Sit to/from Stand Sit to Stand: Supervision           General transfer comment: stood from EOB without AD and close  supervision    Ambulation/Gait                   Stairs             Wheelchair Mobility     Tilt Bed Tilt Bed Patient Response: Flat affect  Modified Rankin (Stroke Patients Only)       Balance Overall balance assessment: Needs assistance Sitting-balance support: Bilateral upper extremity supported, Feet supported Sitting balance-Leahy Scale: Good     Standing balance support: No upper extremity supported Standing balance-Leahy Scale: Fair Standing balance comment: able to maintain static standing balance with supervision while RN changed bed linens                            Communication Communication Communication: No apparent difficulties  Cognition Arousal: Alert Behavior During Therapy: Flat affect   PT - Cognitive impairments: No apparent impairments                         Following commands: Intact      Cueing Cueing Techniques: Verbal cues  Exercises      General Comments General comments (skin integrity, edema, etc.): Pt being transferred to 3E; found with soiled gown and bedsheets      Pertinent Vitals/Pain Pain Assessment Pain Assessment: No/denies pain    Home Living                          Prior Function  PT Goals (current goals can now be found in the care plan section) Acute Rehab PT Goals Patient Stated Goal: to get stronger PT Goal Formulation: With patient Time For Goal Achievement: 02/25/24 Potential to Achieve Goals: Good Progress towards PT goals: Progressing toward goals    Frequency    Min 2X/week      PT Plan      Co-evaluation              AM-PAC PT 6 Clicks Mobility   Outcome Measure  Help needed turning from your back to your side while in a flat bed without using bedrails?: A Little Help needed moving from lying on your back to sitting on the side of a flat bed without using bedrails?: A Little Help needed moving to and from a bed to a  chair (including a wheelchair)?: A Little Help needed standing up from a chair using your arms (e.g., wheelchair or bedside chair)?: A Little Help needed to walk in hospital room?: A Little Help needed climbing 3-5 steps with a railing? : A Lot 6 Click Score: 17    End of Session   Activity Tolerance: Patient tolerated treatment well Patient left: in bed;with nursing/sitter in room Nurse Communication: Mobility status PT Visit Diagnosis: Unsteadiness on feet (R26.81);Other abnormalities of gait and mobility (R26.89);Muscle weakness (generalized) (M62.81)     Time: 9080-9071 PT Time Calculation (min) (ACUTE ONLY): 9 min  Charges:    $Therapeutic Activity: 8-22 mins PT General Charges $$ ACUTE PT VISIT: 1 Visit                     Therisa Stains PT, DPT Therisa HERO Zaunegger 02/12/2024, 9:32 AM

## 2024-02-12 NOTE — Progress Notes (Signed)
 PROGRESS NOTE  Joseph Tanner FMW:[id number] DOB: 1951/01/07 DOA: 02/10/2024 PCP: Pcp, No   LOS: 2 days   Brief Narrative / Interim history: 73 y.o. male with medical history significant of hypertension, hyperlipidemia, combined systolic and diastolic CHF, NSTEMI, CAD s/p PCI, COPD, CKD stage IV, and cocaine abuse presents with shortness of breath.  Patient reports similar episode last November, he was hospitalized, improved and sent home.  Has not had any follow-up since.  Current symptoms started for the past week or so.  He was found to be fluid overload in the ER, admitted to the hospital and placed on diuretics.  Cardiology was consulted.  He reports he quit tobacco and alcohol for several months, last time he used cocaine was about 4 weeks ago  Subjective / 24h Interval events: Urinating a lot, feels better, feels like he has less fluid in his body.  Assesement and Plan: Principal problem Acute on chronic combined CHF -patient admitted to the hospital with significant fluid overload.  Most recent 2D echo last year showed EF 15% with grade 3 diastolic dysfunction.  2D echocardiogram done 02/11/2024 shows LVEF 20-25%, global hypokinesis, RV systolic function was also severely reduced - Cardiology consulted, appreciate input, patient was started on dobutamine  and transferred to progressive.  Continue furosemide   Active problems Acute kidney injury on chronic kidney disease stage IV -baseline creatinine 2.2-2.4.  Up to 3.2 on 7/16, but better this morning, 2.9  Possible pneumonia -based on the CT scan, but not sure that he truly has pneumonia with evidence of fluid overload, no fever, low procalcitonin and no white count elevation.  Cultures have remained negative, he is afebrile without any respiratory symptoms, discontinue antibiotics today  Elevated lactic acid, anion gap metabolic acidosis-with concern of poor perfusion, renal failure.  Start sodium  bicarb  Diarrhea-resolved  UTI-asymptomatic, this is likely bacteriuria  CAD, elevated troponin -likely demand ischemia, not in a pattern consistent with ACS.  No chest pain.  Left heart cath March 2022 with severe stenosis small Lcx and mild moderate stenosis RCA that was to be treated medically.   Hyperkalemia-due to renal failure.  On IV Lasix , received Lokelma .  Potassium improved  Hypomagnesemia-replenish magnesium  again today  Cocaine use -none for the past 4 weeks per patient, however UDS still positive  Thrombocytopenia, anemia -fairly chronic, no bleeding, monitor  Scheduled Meds:  acetaminophen   1,000 mg Oral Once   atorvastatin   40 mg Oral QPM   Chlorhexidine  Gluconate Cloth  6 each Topical Daily   furosemide   80 mg Intravenous BID   guaiFENesin   600 mg Oral BID   sodium bicarbonate   650 mg Oral TID   sodium chloride  flush  10-40 mL Intracatheter Q12H   sodium chloride  flush  3 mL Intravenous Q12H   Continuous Infusions:  cefTRIAXone  (ROCEPHIN )  IV 2 g (02/11/24 1350)   DOBUTamine      magnesium  sulfate bolus IVPB     PRN Meds:.acetaminophen  **OR** acetaminophen , albuterol , melatonin, ondansetron  **OR** ondansetron  (ZOFRAN ) IV, sodium chloride  flush  Current Outpatient Medications  Medication Instructions   albuterol  (VENTOLIN  HFA) 108 (90 Base) MCG/ACT inhaler 2 puffs, Every 6 hours PRN   atorvastatin  (LIPITOR) 40 mg, Oral, Every evening   carvedilol  (COREG ) 3.125 mg, Oral, Daily   ibuprofen (ADVIL) 1,600 mg, Oral, As needed   isosorbide-hydrALAZINE  (BIDIL) 20-37.5 MG tablet 0.5 tablets, Oral, 3 times daily   Multiple Vitamin (MULTIVITAMIN WITH MINERALS) TABS tablet 1 tablet, Oral, Daily   sacubitril -valsartan  (ENTRESTO ) 24-26 MG 1 tablet,  Oral, Every morning   spironolactone  (ALDACTONE ) 25 mg, Oral, Daily    Diet Orders (From admission, onward)     Start     Ordered   02/10/24 1143  Diet Heart Room service appropriate? Yes; Fluid consistency: Thin  Diet  effective now       Question Answer Comment  Room service appropriate? Yes   Fluid consistency: Thin      02/10/24 1145            DVT prophylaxis: SCDs Start: 02/10/24 1140   Lab Results  Component Value Date   PLT 90 (L) 02/12/2024      Code Status: Full Code  Family Communication: No family at bedside  Status is: Inpatient Remains inpatient appropriate because: Severity of illness   Level of care: Progressive  Consultants:  Cardiology  Objective: Vitals:   02/11/24 1601 02/11/24 2158 02/12/24 0522 02/12/24 0748  BP: 105/68 102/64 113/81 121/83  Pulse: 84 78 86 86  Resp: 16  18 19   Temp: 98.5 F (36.9 C) 97.6 F (36.4 C) 97.8 F (36.6 C) 98.2 F (36.8 C)  TempSrc:  Oral Oral   SpO2: 100% 100% 100% 100%  Weight:      Height:        Intake/Output Summary (Last 24 hours) at 02/12/2024 1037 Last data filed at 02/12/2024 1005 Gross per 24 hour  Intake 100 ml  Output 3475 ml  Net -3375 ml   Wt Readings from Last 3 Encounters:  02/11/24 69 kg  06/24/23 67 kg  06/14/23 64.6 kg    Examination:  Constitutional: NAD Eyes: lids and conjunctivae normal, no scleral icterus ENMT: mmm Neck: normal, supple Respiratory: clear to auscultation bilaterally, no wheezing, no crackles. Normal respiratory effort.  Cardiovascular: Regular rate and rhythm, no murmurs / rubs / gallops. 1+ LE edema. Abdomen: soft, no distention, no tenderness. Bowel sounds positive.   Data Reviewed: I have independently reviewed following labs and imaging studies   CBC Recent Labs  Lab 02/10/24 0900 02/11/24 0326 02/12/24 0240  WBC 4.3 5.3 5.6  HGB 8.8* 8.2* 8.2*  HCT 29.5* 27.3* 25.6*  PLT 96* 86* 90*  MCV 78.0* 77.6* 74.4*  MCH 23.3* 23.3* 23.8*  MCHC 29.8* 30.0 32.0  RDW 23.3* 23.2* 23.0*  LYMPHSABS 0.3*  --   --   MONOABS 0.2  --   --   EOSABS 0.0  --   --   BASOSABS 0.0  --   --     Recent Labs  Lab 02/10/24 0900 02/10/24 1103 02/10/24 1339 02/10/24 2028  02/10/24 2142 02/11/24 0326 02/11/24 1508 02/12/24 0240  NA 136  --   --   --   --  139  --  135  K 5.6*  --   --   --   --  5.9* 4.4 3.9  CL 108  --   --   --   --  109  --  105  CO2 15*  --   --   --   --  11*  --  18*  GLUCOSE 115*  --   --   --   --  103*  --  129*  BUN 36*  --   --   --   --  39*  --  40*  CREATININE 2.95*  --   --   --   --  3.28*  --  2.92*  CALCIUM  9.0  --   --   --   --  9.2  --  8.4*  AST 48*  --   --   --   --   --   --  48*  ALT 33  --   --   --   --   --   --  27  ALKPHOS 57  --   --   --   --   --   --  51  BILITOT 5.8*  --   --   --   --   --   --  3.3*  ALBUMIN 2.8*  --   --   --   --   --   --  2.2*  MG 1.6*  --   --   --   --   --   --  1.6*  PROCALCITON  --   --   --  0.59  --   --   --   --   LATICACIDVEN  --  5.8* 5.1*  --  >9.0* 7.6*  --   --   BNP  --   --   --  >4,500.0*  --   --   --   --     ------------------------------------------------------------------------------------------------------------------ No results for input(s): CHOL, HDL, LDLCALC, TRIG, CHOLHDL, LDLDIRECT in the last 72 hours.  No results found for: HGBA1C ------------------------------------------------------------------------------------------------------------------ No results for input(s): TSH, T4TOTAL, T3FREE, THYROIDAB in the last 72 hours.  Invalid input(s): FREET3  Cardiac Enzymes No results for input(s): CKMB, TROPONINI, MYOGLOBIN in the last 168 hours.  Invalid input(s): CK ------------------------------------------------------------------------------------------------------------------    Component Value Date/Time   BNP >4,500.0 (H) 02/10/2024 2028    CBG: No results for input(s): GLUCAP in the last 168 hours.  Recent Results (from the past 240 hours)  Resp panel by RT-PCR (RSV, Flu A&B, Covid) Anterior Nasal Swab     Status: None   Collection Time: 02/10/24  8:51 AM   Specimen: Anterior Nasal Swab  Result Value  Ref Range Status   SARS Coronavirus 2 by RT PCR NEGATIVE NEGATIVE Final   Influenza A by PCR NEGATIVE NEGATIVE Final   Influenza B by PCR NEGATIVE NEGATIVE Final    Comment: (NOTE) The Xpert Xpress SARS-CoV-2/FLU/RSV plus assay is intended as an aid in the diagnosis of influenza from Nasopharyngeal swab specimens and should not be used as a sole basis for treatment. Nasal washings and aspirates are unacceptable for Xpert Xpress SARS-CoV-2/FLU/RSV testing.  Fact Sheet for Patients: BloggerCourse.com  Fact Sheet for Healthcare Providers: SeriousBroker.it  This test is not yet approved or cleared by the [place]  FDA and has been authorized for detection and/or diagnosis of SARS-CoV-2 by FDA under an Emergency Use Authorization (EUA). This EUA will remain in effect (meaning this test can be used) for the duration of the COVID-19 declaration under Section 564(b)(1) of the Act, 21 U.S.C. section 360bbb-3(b)(1), unless the authorization is terminated or revoked.     Resp Syncytial Virus by PCR NEGATIVE NEGATIVE Final    Comment: (NOTE) Fact Sheet for Patients: BloggerCourse.com  Fact Sheet for Healthcare Providers: SeriousBroker.it  This test is not yet approved or cleared by the [place]  FDA and has been authorized for detection and/or diagnosis of SARS-CoV-2 by FDA under an Emergency Use Authorization (EUA). This EUA will remain in effect (meaning this test can be used) for the duration of the COVID-19 declaration under Section 564(b)(1) of the Act, 21 U.S.C. section 360bbb-3(b)(1), unless the authorization is terminated or revoked.  Performed at [facility]  Lab, [address], [place], [place] [address]   Culture, blood (routine x 2)     Status: None (Preliminary result)   Collection Time: 02/10/24  9:00 AM   Specimen: BLOOD RIGHT HAND  Result Value Ref Range  Status   Specimen Description BLOOD RIGHT HAND  Final   Special Requests   Final    BOTTLES DRAWN AEROBIC AND ANAEROBIC Blood Culture results may not be optimal due to an inadequate volume of blood received in culture bottles   Culture   Final    NO GROWTH 2 DAYS Performed at [facility], [address] [place], [place] [address]    Report Status PENDING  Incomplete  Culture, blood (routine x 2)     Status: None (Preliminary result)   Collection Time: 02/10/24  8:28 PM   Specimen: BLOOD RIGHT HAND  Result Value Ref Range Status   Specimen Description BLOOD RIGHT HAND  Final   Special Requests   Final    BOTTLES DRAWN AEROBIC AND ANAEROBIC Blood Culture results may not be optimal due to an inadequate volume of blood received in culture bottles   Culture   Final    NO GROWTH 2 DAYS Performed at [facility] Lab, [address] [place], [place] [address]    Report Status PENDING  Incomplete     Radiology Studies: US  EKG SITE RITE Result Date: 02/11/2024 If Site Rite image not attached, placement could not be confirmed due to current cardiac rhythm.  ECHOCARDIOGRAM COMPLETE Result Date: 02/11/2024    ECHOCARDIOGRAM REPORT   Patient Name:   Joseph Tanner [facility] Date of Exam: 02/11/2024 Medical Rec #:  [id number]               Height:       75.0 in Accession #:    [id number]              Weight:       152.1 lb Date of Birth:  October 04, 1950                BSA:          1.953 m Patient Age:    73 years                BP:           116/79 mmHg Patient Gender: M                       HR:           85 bpm. Exam Location:  Inpatient Procedure: 2D Echo, Cardiac Doppler and Color Doppler (Both Spectral and Color            Flow Doppler were utilized during procedure). Indications:    I50.40* Unspecified combined systolic (congestive) and diastolic                 (congestive) heart failure  History:        Patient has prior history of Echocardiogram examinations, most                 recent  06/15/2023. CHF; CAD and Previous Myocardial Infarction.                 Cocaine.  Sonographer:    Ellouise Mose RDCS Referring Phys: [id number] RONDELL A SMITH IMPRESSIONS  1. Left ventricular ejection fraction, by estimation, is 20 to 25%. The left ventricle has severely decreased function.  The left ventricle demonstrates global hypokinesis. The left ventricular internal cavity size was severely dilated. There is mild concentric left ventricular hypertrophy. Left ventricular diastolic parameters are consistent with Grade I diastolic dysfunction (impaired relaxation).  2. Right ventricular systolic function is severely reduced. The right ventricular size is moderately enlarged. There is moderately elevated pulmonary artery systolic pressure.  3. Left atrial size was moderately dilated.  4. Right atrial size was severely dilated.  5. Large pleural effusion in the left lateral region.  6. The mitral valve is normal in structure. Trivial mitral valve regurgitation. No evidence of mitral stenosis.  7. The aortic valve is tricuspid. Aortic valve regurgitation is not visualized. No aortic stenosis is present.  8. Aortic dilatation noted. There is mild dilatation of the ascending aorta, measuring 41 mm.  9. The inferior vena cava is dilated in size with <50% respiratory variability, suggesting right atrial pressure of 15 mmHg. Conclusion(s)/Recommendation(s): No left ventricular mural or apical thrombus/thrombi. FINDINGS  Left Ventricle: Left ventricular ejection fraction, by estimation, is 20 to 25%. The left ventricle has severely decreased function. The left ventricle demonstrates global hypokinesis. The left ventricular internal cavity size was severely dilated. There is mild concentric left ventricular hypertrophy. Left ventricular diastolic parameters are consistent with Grade I diastolic dysfunction (impaired relaxation). Right Ventricle: The right ventricular size is moderately enlarged. No increase in right ventricular  wall thickness. Right ventricular systolic function is severely reduced. There is moderately elevated pulmonary artery systolic pressure. The tricuspid regurgitant velocity is 2.78 m/s, and with an assumed right atrial pressure of 15 mmHg, the estimated right ventricular systolic pressure is 45.9 mmHg. Left Atrium: Left atrial size was moderately dilated. Right Atrium: Right atrial size was severely dilated. Pericardium: There is no evidence of pericardial effusion. Mitral Valve: The mitral valve is normal in structure. Trivial mitral valve regurgitation. No evidence of mitral valve stenosis. Tricuspid Valve: The tricuspid valve is normal in structure. Tricuspid valve regurgitation is mild . No evidence of tricuspid stenosis. Aortic Valve: The aortic valve is tricuspid. Aortic valve regurgitation is not visualized. No aortic stenosis is present. Pulmonic Valve: The pulmonic valve was normal in structure. Pulmonic valve regurgitation is mild. No evidence of pulmonic stenosis. Aorta: Aortic dilatation noted. There is mild dilatation of the ascending aorta, measuring 41 mm. Venous: The inferior vena cava is dilated in size with less than 50% respiratory variability, suggesting right atrial pressure of 15 mmHg. IAS/Shunts: No atrial level shunt detected by color flow Doppler. Additional Comments: There is a large pleural effusion in the left lateral region.  LEFT VENTRICLE PLAX 2D LVIDd:         6.60 cm      Diastology LVIDs:         6.30 cm      LV e' medial:    4.57 cm/s LV PW:         1.20 cm      LV E/e' medial:  19.5 LV IVS:        1.00 cm      LV e' lateral:   10.00 cm/s LVOT diam:     2.40 cm      LV E/e' lateral: 8.9 LV SV:         67 LV SV Index:   34 LVOT Area:     4.52 cm  LV Volumes (MOD) LV vol d, MOD A2C: 273.5 ml LV vol d, MOD A4C: 225.7 ml LV vol s, MOD A2C: 214.5 ml LV vol  s, MOD A4C: 177.3 ml LV SV MOD A2C:     59.0 ml LV SV MOD A4C:     225.7 ml LV SV MOD BP:      52.8 ml RIGHT VENTRICLE             IVC RV S prime:     7.83 cm/s  IVC diam: 2.50 cm TAPSE (M-mode): 1.6 cm LEFT ATRIUM             Index        RIGHT ATRIUM           Index LA diam:        4.30 cm 2.20 cm/m   RA Area:     28.50 cm LA Vol (A2C):   93.9 ml 48.07 ml/m  RA Volume:   105.00 ml 53.75 ml/m LA Vol (A4C):   74.7 ml 38.24 ml/m LA Biplane Vol: 87.2 ml 44.64 ml/m  AORTIC VALVE LVOT Vmax:   93.80 cm/s LVOT Vmean:  62.400 cm/s LVOT VTI:    0.148 m  AORTA Ao Root diam: 3.50 cm Ao Asc diam:  4.15 cm MITRAL VALVE               TRICUSPID VALVE MV Area (PHT): 4.39 cm    TR Peak grad:   30.9 mmHg MV Decel Time: 173 msec    TR Vmax:        278.00 cm/s MV E velocity: 89.10 cm/s                            SHUNTS                            Systemic VTI:  0.15 m                            Systemic Diam: 2.40 cm Kardie Tobb DO Electronically signed by Dub Huntsman DO Signature Date/Time: 02/11/2024/2:38:48 PM    Final    Nilda Fendt, MD, PhD [facility]  Between 7 am - 7 pm I am available, please contact me via Amion (for emergencies) or Securechat (non urgent messages)  Between 7 pm - 7 am I am not available, please contact night coverage MD/APP via Amion

## 2024-02-12 NOTE — Progress Notes (Signed)
 Heart Failure Navigator Progress Note  Assessed for Heart & Vascular TOC clinic readiness.  Patient does not meet criteria due to Advanced Heart Failure Team consulted with Dr. Alease Amend. .   Navigator will sign off at this time.   Randie Bustle, BSN, Scientist, clinical (histocompatibility and immunogenetics) Only

## 2024-02-12 NOTE — Progress Notes (Signed)
 Patient transferred to 3 east from 2 west. Patient is a/o x4. MAE's x4. Patient is on c-diff precautions until can rule out. Patient states he had several lose bm's at home prior to admission. No bm's per patient in greater than 24 hours. Edema noted in both bilateral lower extremities. MP shows NSR with pvc's. Patient oriented to bed call bell and room. Patient denies complaints of pain or shortness of breath. Patient is on room air. Patient has a peripheral iv left ac and right upper arm double PICC line.

## 2024-02-13 ENCOUNTER — Encounter (HOSPITAL_COMMUNITY): Payer: Self-pay | Admitting: Internal Medicine

## 2024-02-13 DIAGNOSIS — R570 Cardiogenic shock: Secondary | ICD-10-CM

## 2024-02-13 DIAGNOSIS — J189 Pneumonia, unspecified organism: Secondary | ICD-10-CM | POA: Diagnosis not present

## 2024-02-13 DIAGNOSIS — I5043 Acute on chronic combined systolic (congestive) and diastolic (congestive) heart failure: Secondary | ICD-10-CM | POA: Diagnosis not present

## 2024-02-13 DIAGNOSIS — Z515 Encounter for palliative care: Secondary | ICD-10-CM | POA: Diagnosis not present

## 2024-02-13 DIAGNOSIS — Z7189 Other specified counseling: Secondary | ICD-10-CM | POA: Diagnosis not present

## 2024-02-13 LAB — COMPREHENSIVE METABOLIC PANEL WITH GFR
ALT: 25 U/L (ref 0–44)
AST: 36 U/L (ref 15–41)
Albumin: 2.2 g/dL — ABNORMAL LOW (ref 3.5–5.0)
Alkaline Phosphatase: 51 U/L (ref 38–126)
Anion gap: 10 (ref 5–15)
BUN: 36 mg/dL — ABNORMAL HIGH (ref 8–23)
CO2: 23 mmol/L (ref 22–32)
Calcium: 8.3 mg/dL — ABNORMAL LOW (ref 8.9–10.3)
Chloride: 101 mmol/L (ref 98–111)
Creatinine, Ser: 2.69 mg/dL — ABNORMAL HIGH (ref 0.61–1.24)
GFR, Estimated: 24 mL/min — ABNORMAL LOW (ref >60)
Glucose, Bld: 136 mg/dL — ABNORMAL HIGH (ref 70–99)
Potassium: 3.5 mmol/L (ref 3.5–5.1)
Sodium: 134 mmol/L — ABNORMAL LOW (ref 135–145)
Total Bilirubin: 2.4 mg/dL — ABNORMAL HIGH (ref 0.0–1.2)
Total Protein: 5.6 g/dL — ABNORMAL LOW (ref 6.5–8.1)

## 2024-02-13 LAB — PHOSPHORUS: Phosphorus: 2.1 mg/dL — ABNORMAL LOW (ref 2.5–4.6)

## 2024-02-13 LAB — CBC
HCT: 26.2 % — ABNORMAL LOW (ref 39.0–52.0)
Hemoglobin: 8.4 g/dL — ABNORMAL LOW (ref 13.0–17.0)
MCH: 23.5 pg — ABNORMAL LOW (ref 26.0–34.0)
MCHC: 32.1 g/dL (ref 30.0–36.0)
MCV: 73.2 fL — ABNORMAL LOW (ref 80.0–100.0)
Platelets: 91 K/uL — ABNORMAL LOW (ref 150–400)
RBC: 3.58 MIL/uL — ABNORMAL LOW (ref 4.22–5.81)
RDW: 22.5 % — ABNORMAL HIGH (ref 11.5–15.5)
WBC: 4.1 K/uL (ref 4.0–10.5)
nRBC: 0 % (ref 0.0–0.2)

## 2024-02-13 LAB — COOXEMETRY PANEL
Carboxyhemoglobin: 1.2 % (ref 0.5–1.5)
Methemoglobin: <0.7 % (ref 0.0–1.5)
O2 Saturation: 53.7 %
Total hemoglobin: 9.1 g/dL — ABNORMAL LOW (ref 12.0–16.0)

## 2024-02-13 LAB — URINE CULTURE: Culture: >=100000 — AB

## 2024-02-13 LAB — MAGNESIUM: Magnesium: 1.7 mg/dL (ref 1.7–2.4)

## 2024-02-13 MED ORDER — SODIUM CHLORIDE 0.9 % IV BOLUS
250.0000 mL | Freq: Once | INTRAVENOUS | Status: AC | PRN
  Administered 2024-02-13: 250 mL via INTRAVENOUS

## 2024-02-13 MED ORDER — MEXILETINE HCL 200 MG PO CAPS
200.0000 mg | ORAL_CAPSULE | Freq: Two times a day (BID) | ORAL | Status: DC
  Administered 2024-02-13 – 2024-02-20 (×15): 200 mg via ORAL
  Filled 2024-02-13 (×16): qty 1

## 2024-02-13 MED ORDER — TAMSULOSIN HCL 0.4 MG PO CAPS
0.4000 mg | ORAL_CAPSULE | Freq: Every day | ORAL | Status: DC
  Administered 2024-02-13 – 2024-02-20 (×8): 0.4 mg via ORAL
  Filled 2024-02-13 (×8): qty 1

## 2024-02-13 MED ORDER — POTASSIUM CHLORIDE CRYS ER 20 MEQ PO TBCR
40.0000 meq | EXTENDED_RELEASE_TABLET | Freq: Once | ORAL | Status: AC
  Administered 2024-02-13: 40 meq via ORAL
  Filled 2024-02-13: qty 2

## 2024-02-13 MED ORDER — MAGNESIUM SULFATE 4 GM/100ML IV SOLN
4.0000 g | Freq: Once | INTRAVENOUS | Status: AC
  Administered 2024-02-13: 4 g via INTRAVENOUS
  Filled 2024-02-13: qty 100

## 2024-02-13 NOTE — Progress Notes (Signed)
 Bladder scan revealed 387 ml. HF PA Morna Dutch notified.

## 2024-02-13 NOTE — Plan of Care (Signed)

## 2024-02-13 NOTE — TOC Initial Note (Signed)
 Transition of Care [facility]) - Initial/Assessment Note    Patient Details  Name: Joseph Tanner MRN: [id number] Date of Birth: Oct 02, 1950  [facility]) CM/SW Contact:    Justina Delcia Czar, RN Phone Number: [phone] 02/13/2024, 11:52 AM  Clinical Narrative:                 Spoke to pt and states he lives at home. States his daughters assist with transportation to appts. Gave permission to speak to daughters.  Has scale at home for daily weights. Educated daily weights and low sodium/heart healthy diet to help manage HF symptoms.  Discussed substance abuse and tobacco use. Pt has resources to review for assistance in community.  Has cane at home.  Will need new PCP appt, does not have PCP. Will schedule PCP on day of dc.  Expected Discharge Plan: Home/Self Care Barriers to Discharge: Continued Medical Work up   Patient Goals and CMS Choice Patient states their goals for this hospitalization and ongoing recovery are:: wants to get better          Expected Discharge Plan and Services   Discharge Planning Services: CM Consult   Living arrangements for the past 2 months: Apartment                                      Prior Living Arrangements/Services Living arrangements for the past 2 months: Apartment Lives with:: Self Patient language and need for interpreter reviewed:: Yes Do you feel safe going back to the place where you live?: Yes      Need for Family Participation in Patient Care: No (Comment) Care giver support system in place?: Yes (comment) Current home services: DME (scale, cane) Criminal Activity/Legal Involvement Pertinent to Current Situation/Hospitalization: No - Comment as needed  Activities of Daily Living   ADL Screening (condition at time of admission) Independently performs ADLs?: Yes (appropriate for developmental age) Is the patient deaf or have difficulty hearing?: No Does the patient have difficulty seeing, even  when wearing glasses/contacts?: No Does the patient have difficulty concentrating, remembering, or making decisions?: No  Permission Sought/Granted Permission sought to share information with : Case Manager, Family Supports, PCP Permission granted to share information with : Yes, Verbal Permission Granted  Share Information with NAME: Harlene Taber  Permission granted to share info w AGENCY: PCP, DME  Permission granted to share info w Relationship: daughter  Permission granted to share info w Contact Information: [phone]  Emotional Assessment Appearance:: Appears stated age Attitude/Demeanor/Rapport: Engaged Affect (typically observed): Accepting Orientation: : Oriented to Self, Oriented to Place, Oriented to  Time, Oriented to Situation   Psych Involvement: No (comment)  Admission diagnosis:  CAP (community acquired pneumonia) [J18.9] Dyspnea, unspecified type [R06.00] Patient Active Problem List   Diagnosis Date Noted   Cardiogenic shock (HCC) 02/12/2024   CAP (community acquired pneumonia) 02/10/2024   Urinary tract infection 02/10/2024   Lactic acidosis 02/10/2024   Normal anion gap metabolic acidosis 02/10/2024   Elevated troponin 02/10/2024   Hyperkalemia 02/10/2024   Pancytopenia (HCC) 02/10/2024   Hypomagnesemia 02/10/2024   Hypotension 06/17/2023   Cocaine abuse (HCC) 06/17/2023   Acute on chronic combined systolic and diastolic CHF (congestive heart failure) (HCC) 06/17/2023   Acute kidney injury superimposed on chronic kidney disease (HCC) 06/14/2023   Pure hypercholesterolemia 03/15/2021   Demand ischemia (HCC) 11/21/2020   Coronary artery disease 11/21/2020  CKD (chronic kidney disease) stage 3, GFR 30-59 ml/min (HCC) 11/21/2020   Nephrolithiasis 11/21/2020   Pyuria 11/21/2020   Protein-calorie malnutrition, severe 11/21/2020   Chronic combined systolic and diastolic heart failure (HCC) 11/20/2020   NSTEMI (non-ST elevated myocardial infarction) (HCC)  10/14/2020   PCP:  Freddrick, No Pharmacy:   [id number] - [place], [place] - [address] [place] [address] Phone: [phone] Fax: [phone]     Social Drivers of Health (SDOH) Social History: SDOH Screenings   Food Insecurity: No Food Insecurity (02/10/2024)  Housing: Low Risk  (02/10/2024)  Transportation Needs: No Transportation Needs (02/10/2024)  Utilities: Not At Risk (02/10/2024)  Alcohol Screen: Low Risk  (06/17/2023)  Financial Resource Strain: Low Risk  (06/17/2023)  Social Connections: Socially Isolated (02/10/2024)  Tobacco Use: High Risk (02/13/2024)   SDOH Interventions:     Readmission Risk Interventions    02/11/2024   11:47 AM  Readmission Risk Prevention Plan  Transportation Screening Complete  PCP or Specialist Appt within 5-7 Days Complete  Home Care Screening Complete  Medication Review (RN CM) Complete

## 2024-02-13 NOTE — Consult Note (Signed)
 Consultation Note Date: 02/13/2024   Patient Name: Joseph Tanner  DOB: 06/15/1951  MRN: [id number]  Age / Sex: 73 y.o., male  PCP: Pcp, No Referring Physician: Trixie Nilda HERO, MD  Reason for Consultation: Establishing goals of care  HPI/Patient Profile: 73 y.o. male  with past medical history of hypertension, hyperlipidemia, combined systolic and diastolic CHF, NSTEMI, CAD s/p PCI, COPD, CKD stage IV, and cocaine abuse admitted on 02/10/2024 with shortness of breath. He was found to be fluid overload in the ER, admitted to the hospital and placed on diuretics. 2D echocardiogram done 02/11/2024 shows LVEF 20-25%, global hypokinesis, RV systolic function was also severely reduced. Patient was started on dobutamine . Patient not a good candidate for home inotropes or advanced therapies per HF team. PMT consulted to discuss GOC.   Clinical Assessment and Goals of Care: I have reviewed medical records including EPIC notes, labs and imaging, received report from RN, assessed the patient and then met with patient  to discuss diagnosis prognosis, GOC, EOL wishes, disposition and options.  I introduced Palliative Medicine as specialized medical care for people living with serious illness. It focuses on providing relief from the symptoms and stress of a serious illness. The goal is to improve quality of life for both the patient and the family.  We discussed a brief life review of the patient. He tells me he has been married to his wife [name] for over 22 years. He tells me they do not live together, separated but remain legally married. He tells me he has 2 daughters as well - Harlene and Sonya. He tells me about his passion for life and how much he enjoys exploring new technology. He also shares about his drug and ETOH use.   As far as functional and nutritional status he tells me he has been living alone and fully functional - able to independently  care for himself. He tells me he has good days/bad days. Some days activity is limited d/t shortness of breath and other days he feels okay. He occasionally uses a cane.    We discussed patient's current illness and what it means in the larger context of patient's on-going co-morbidities.  Natural disease trajectory and expectations at EOL were discussed. We discussed chronic progressive nature of his heart disease. He understands and accepts this.   I attempted to elicit values and goals of care important to the patient.    The difference between aggressive medical intervention and comfort care was considered in light of the patient's goals of care.   Advance directives, concepts specific to code status, artificial feeding and hydration, and rehospitalization were considered and discussed.  Patient tells me he wants to remain a full code. We discuss this at length and he remains open to full code interventions. However, he also shares that once he leaves the hospital he never wants to come back. I ask him what if he is declining at home, doing very poorly and he shares he still does not want to come back to the hospital. We discussed option of hospice support to support him at home and keep him comfortable. He is open to this. He tells me he has had family under hospice care so he is familiar with their services. We discuss focus on comfort and quality of life, avoiding aggressive medical interventions. He is open to this but requests time to speak to his wife and daughters before he makes a decision. We do discuss how his desire for no  more hospitalizations and possible hospice support do not really align with full code status. He will consider this. For now, to remain full code.   Discussed with patient the importance of continued conversation with family and the medical providers regarding overall plan of care and treatment options, ensuring decisions are within the context of the patient's values  and GOCs.    We also discussed HCPOA - discussed that even though he and his wife are separated since they remain legally married she would be decision maker without HCPOA that says otherwise - he is okay with this - he tells me he trusts her and she knows him well, he believes she would make the decisions for him he would make for himself. Therefore, he is not interested in completing HCPOA document.   Questions and concerns were addressed. The family was encouraged to call with questions or concerns.    Primary Decision Maker PATIENT    SUMMARY OF RECOMMENDATIONS   Initial goals of care discussion - while patient, at this time, wants to remain full code he seems to be quite set on his intent to not return to the hospital - potentially open to hospice support at home - he wants time to consider options before making referral and speak to family - agrees to follow up with PMT again tomorrow     Primary Diagnoses: Present on Admission:  CAP (community acquired pneumonia)  Chronic combined systolic and diastolic heart failure (HCC)  Urinary tract infection  Lactic acidosis  Normal anion gap metabolic acidosis  Elevated troponin  Coronary artery disease  Acute kidney injury superimposed on chronic kidney disease (HCC)  Hyperkalemia  Pancytopenia (HCC)  Hypomagnesemia  Cocaine abuse (HCC)   I have reviewed the medical record, interviewed the patient and family, and examined the patient. The following aspects are pertinent.  Past Medical History:  Diagnosis Date   COPD (chronic obstructive pulmonary disease) (HCC)    Hyperlipemia    Hypertension    NSTEMI (non-ST elevated myocardial infarction) (HCC)    Pure hypercholesterolemia 03/15/2021   Social History   Socioeconomic History   Marital status: Divorced    Spouse name: Not on file   Number of children: 5   Years of education: Not on file   Highest education level: Associate degree: academic program  Occupational  History   Occupation: Retired  Tobacco Use   Smoking status: Some Days    Types: Cigarettes   Smokeless tobacco: Never  Vaping Use   Vaping status: Never Used  Substance and Sexual Activity   Alcohol use: Not Currently   Drug use: Yes    Types: Cocaine    Comment: 6 months   Sexual activity: Not on file  Other Topics Concern   Not on file  Social History Narrative   Not on file   Social Drivers of Health   Financial Resource Strain: Low Risk  (06/17/2023)   Overall Financial Resource Strain (CARDIA)    Difficulty of Paying Living Expenses: Not very hard  Food Insecurity: No Food Insecurity (02/10/2024)   Hunger Vital Sign    Worried About Running Out of Food in the Last Year: Never true    Ran Out of Food in the Last Year: Never true  Transportation Needs: No Transportation Needs (02/10/2024)   PRAPARE - Administrator, Civil Service (Medical): No    Lack of Transportation (Non-Medical): No  Physical Activity: Not on file  Stress: Not on file  Social Connections:  Socially Isolated (02/10/2024)   Social Connection and Isolation Panel    Frequency of Communication with Friends and Family: Once a week    Frequency of Social Gatherings with Friends and Family: Once a week    Attends Religious Services: Never    Database administrator or Organizations: No    Attends Engineer, structural: Never    Marital Status: Separated   Family History  Problem Relation Age of Onset   Heart failure Mother    Heart failure Maternal Grandmother    Heart failure Maternal Grandfather    Heart failure Paternal Grandmother    Heart failure Paternal Grandfather    Scheduled Meds:  acetaminophen   1,000 mg Oral Once   atorvastatin   40 mg Oral QPM   Chlorhexidine  Gluconate Cloth  6 each Topical Daily   feeding supplement  237 mL Oral TID BM   folic acid   1 mg Oral Daily   guaiFENesin   600 mg Oral BID   mexiletine  200 mg Oral Q12H   multivitamin with minerals  1 tablet  Oral Daily   sodium chloride  flush  10-40 mL Intracatheter Q12H   sodium chloride  flush  3 mL Intravenous Q12H   tamsulosin  0.4 mg Oral QPC breakfast   thiamine   100 mg Oral Daily   Continuous Infusions:  DOBUTamine  2.5 mcg/kg/min (02/13/24 0918)   PRN Meds:.acetaminophen  **OR** acetaminophen , albuterol , melatonin, ondansetron  **OR** ondansetron  (ZOFRAN ) IV, sodium chloride  flush No Known Allergies Review of Systems  Constitutional:  Positive for appetite change.    Physical Exam Constitutional:      General: He is not in acute distress.    Appearance: He is ill-appearing.  Pulmonary:     Effort: Pulmonary effort is normal.  Skin:    General: Skin is warm and dry.  Neurological:     Mental Status: He is alert and oriented to person, place, and time.  Psychiatric:        Mood and Affect: Mood normal.     Vital Signs: BP 97/73 (BP Location: Left Arm) Comment (BP Location): left arm  Pulse 82   Temp 98 F (36.7 C)   Resp 17   Ht 6' 3 (1.905 m)   Wt 63 kg   SpO2 100%   BMI 17.35 kg/m  Pain Scale: 0-10   Pain Score: 0-No pain   SpO2: SpO2: 100 % O2 Device:SpO2: 100 % O2 Flow Rate: .   IO: Intake/output summary:  Intake/Output Summary (Last 24 hours) at 02/13/2024 1616 Last data filed at 02/13/2024 0600 Gross per 24 hour  Intake 750 ml  Output 400 ml  Net 350 ml    LBM: Last BM Date : 02/10/24 Baseline Weight: Weight: 80.7 kg Most recent weight: Weight: 63 kg     Palliative Assessment/Data: PPS 60%     *Please note that this is a verbal dictation therefore any spelling or grammatical errors are due to the Dragon Medical One system interpretation.   Time Total: 65 minutes   Time spent includes: Detailed review of medical records (labs, imaging, vital signs), medically appropriate exam, discussion with treatment team, counseling and educating patient, family and/or staff, documenting clinical information, medication management and coordination of  care.    Tobey Jama Barnacle, DNP, AGNP-C Palliative Medicine Team [phone] Pager: [phone]

## 2024-02-13 NOTE — Progress Notes (Addendum)
 Late entry. I was notified by RN around 11 am that patient was hypotensive not long after decreasing DBA to 2.5 mcg/kg/min.  On arrival BP was 60s/40s  He was cool and clammy. Felt dizzy. Volume status low.  Given 250 cc fluid bolus with slow improvement in BP and resolution of symptoms.  Some concern that he may not tolerate DBA wean. However, he is not a good candidate for home inotrope or advanced therapies. We discussed palliative care consult for GOC discussion. He is agreeable.  Joseph Dutch, PA-C  Agree with above.   I evaluate Joseph Tanner as well during this episode with RRT at bedside.  He had profound hypotension after DBA turned off. He also appeared volume depleted.  BP responded well to IVF resuscitation.   We then re-discussed the severity of his HF and limited options.   I told him that we would continue to manage him aggressively but if unable to tolerate DBA wean then we would need a back-up plan to care for him - particularly as he lives alone.   He understood his situation clearly and was open to [facility] consultation to discuss the options.   CCT 50 mins.   Toribio Fuel, MD  6:18 PM

## 2024-02-13 NOTE — Significant Event (Signed)
 Rapid Response Event Note   Reason for Call :  Hypotension  Initial Focused Assessment:  Patient sitting in the chair, reclined.  He complains of feeling dizzy and faint.  He is cool and dry.   BP 59/43  HR 85 RR 21   Dobutamine  gtt at 2.5mcg/kg 70/47  Morna NP and Dr Bensimhon came to bedside  Interventions:  250cc NS bolus Transferred back to bed and laid flat for a few minutes then able to raise his head to 30 degrees   95/57 ,  103/64  He states that he is starting to feel better  Plan of Care:     Event Summary:   MD Notified: Dr Cherrie Call Time: 1059 Arrival Time: 1103 End Time: 1200  Elvin Portland, RN

## 2024-02-13 NOTE — Progress Notes (Signed)
 PROGRESS NOTE  Joseph Tanner FMW:[id number] DOB: 07-30-1950 DOA: 02/10/2024 PCP: Pcp, No   LOS: 3 days   Brief Narrative / Interim history: 73 y.o. male with medical history significant of hypertension, hyperlipidemia, combined systolic and diastolic CHF, NSTEMI, CAD s/p PCI, COPD, CKD stage IV, and cocaine abuse presents with shortness of breath.  Patient reports similar episode last November, he was hospitalized, improved and sent home.  Has not had any follow-up since.  Current symptoms started for the past week or so.  He was found to be fluid overload in the ER, admitted to the hospital and placed on diuretics.  Cardiology was consulted.  He reports he quit tobacco and alcohol for several months, last time he used cocaine was about 4 weeks ago  Subjective / 24h Interval events: He is breathing a whole lot better today.  Reports difficulty voiding  Assesement and Plan: Principal problem Acute on chronic combined CHF -patient admitted to the hospital with significant fluid overload.  Most recent 2D echo last year showed EF 15% with grade 3 diastolic dysfunction.  2D echocardiogram done 02/11/2024 shows LVEF 20-25%, global hypokinesis, RV systolic function was also severely reduced - Cardiology consulted, appreciate input, patient was started on dobutamine  and transferred to progressive.  Continue dobutamine , furosemide  per cardiology.  Clinically improving  Active problems Acute kidney injury on chronic kidney disease stage IV -baseline creatinine 2.2-2.4.  Up to 3.2 on 7/16, continues to improve, 2.6 today  Possible pneumonia -based on the CT scan, but not sure that he truly has pneumonia with evidence of fluid overload, no fever, low procalcitonin and no white count elevation.  Cultures have remained negative, he is afebrile without any respiratory symptoms, biotics were discontinued 7/17  ?  Urinary retention -start Flomax  Elevated lactic acid, anion gap metabolic acidosis-with  concern of poor perfusion, renal failure.  Start sodium bicarb, the metabolic acidosis is now resolved  Diarrhea-resolved  UTI-asymptomatic, this is likely bacteriuria  CAD, elevated troponin -likely demand ischemia, not in a pattern consistent with ACS.  No chest pain.  Left heart cath March 2022 with severe stenosis small Lcx and mild moderate stenosis RCA that was to be treated medically.   Hyperkalemia-due to renal failure.  On IV Lasix , received Lokelma .  Potassium low normal, replenish given ongoing diuresis, along with magnesium   Hypomagnesemia-replenish magnesium  again today  Cocaine use -none for the past 4 weeks per patient, however UDS still positive  Thrombocytopenia, anemia -fairly chronic, no bleeding, monitor  Scheduled Meds:  acetaminophen   1,000 mg Oral Once   atorvastatin   40 mg Oral QPM   Chlorhexidine  Gluconate Cloth  6 each Topical Daily   feeding supplement  237 mL Oral TID BM   folic acid   1 mg Oral Daily   guaiFENesin   600 mg Oral BID   mexiletine  200 mg Oral Q12H   multivitamin with minerals  1 tablet Oral Daily   sodium chloride  flush  10-40 mL Intracatheter Q12H   sodium chloride  flush  3 mL Intravenous Q12H   tamsulosin  0.4 mg Oral QPC breakfast   thiamine   100 mg Oral Daily   Continuous Infusions:  DOBUTamine  2.5 mcg/kg/min (02/13/24 0918)   magnesium  sulfate bolus IVPB 4 g (02/13/24 0817)   PRN Meds:.acetaminophen  **OR** acetaminophen , albuterol , melatonin, ondansetron  **OR** ondansetron  (ZOFRAN ) IV, sodium chloride  flush  Current Outpatient Medications  Medication Instructions   albuterol  (VENTOLIN  HFA) 108 (90 Base) MCG/ACT inhaler 2 puffs, Every 6 hours PRN   atorvastatin  (  LIPITOR) 40 mg, Oral, Every evening   carvedilol  (COREG ) 3.125 mg, Oral, Daily   ibuprofen (ADVIL) 1,600 mg, Oral, As needed   isosorbide-hydrALAZINE  (BIDIL) 20-37.5 MG tablet 0.5 tablets, Oral, 3 times daily   Multiple Vitamin (MULTIVITAMIN WITH MINERALS) TABS tablet 1  tablet, Oral, Daily   sacubitril -valsartan  (ENTRESTO ) 24-26 MG 1 tablet, Oral, Every morning   spironolactone  (ALDACTONE ) 25 mg, Oral, Daily    Diet Orders (From admission, onward)     Start     Ordered   02/12/24 1502  Diet 2 gram sodium Room service appropriate? Yes with Assist; Fluid consistency: Thin  Diet effective now       Question Answer Comment  Room service appropriate? Yes with Assist   Fluid consistency: Thin      02/12/24 1502            DVT prophylaxis: SCDs Start: 02/10/24 1140   Lab Results  Component Value Date   PLT 91 (L) 02/13/2024      Code Status: Full Code  Family Communication: No family at bedside  Status is: Inpatient Remains inpatient appropriate because: Severity of illness   Level of care: Progressive  Consultants:  Cardiology  Objective: Vitals:   02/13/24 0511 02/13/24 0600 02/13/24 0700 02/13/24 0719  BP: 100/61   117/69  Pulse: 88 75 (!) 107   Resp: 20 14 (!) 22 18  Temp: (!) 97.5 F (36.4 C)   97.6 F (36.4 C)  TempSrc: Oral   Oral  SpO2: 100% 100% 100% 100%  Weight: 63 kg     Height:        Intake/Output Summary (Last 24 hours) at 02/13/2024 0946 Last data filed at 02/13/2024 0600 Gross per 24 hour  Intake 1255 ml  Output 975 ml  Net 280 ml   Wt Readings from Last 3 Encounters:  02/13/24 63 kg  06/24/23 67 kg  06/14/23 64.6 kg    Examination:  Constitutional: NAD Eyes: lids and conjunctivae normal, no scleral icterus ENMT: mmm Neck: normal, supple Respiratory: clear to auscultation bilaterally, no wheezing, no crackles. Normal respiratory effort.  Cardiovascular: Regular rate and rhythm, no murmurs / rubs / gallops.  Abdomen: soft, no distention, no tenderness. Bowel sounds positive.   Data Reviewed: I have independently reviewed following labs and imaging studies   CBC Recent Labs  Lab 02/10/24 0900 02/11/24 0326 02/12/24 0240 02/13/24 0448  WBC 4.3 5.3 5.6 4.1  HGB 8.8* 8.2* 8.2* 8.4*  HCT  29.5* 27.3* 25.6* 26.2*  PLT 96* 86* 90* 91*  MCV 78.0* 77.6* 74.4* 73.2*  MCH 23.3* 23.3* 23.8* 23.5*  MCHC 29.8* 30.0 32.0 32.1  RDW 23.3* 23.2* 23.0* 22.5*  LYMPHSABS 0.3*  --   --   --   MONOABS 0.2  --   --   --   EOSABS 0.0  --   --   --   BASOSABS 0.0  --   --   --     Recent Labs  Lab 02/10/24 0900 02/10/24 1103 02/10/24 1339 02/10/24 2028 02/10/24 2142 02/11/24 0326 02/11/24 1508 02/12/24 0240 02/12/24 1030 02/12/24 1440 02/13/24 0448  NA 136  --   --   --   --  139  --  135  --   --  134*  K 5.6*  --   --   --   --  5.9* 4.4 3.9  --   --  3.5  CL 108  --   --   --   --  109  --  105  --   --  101  CO2 15*  --   --   --   --  11*  --  18*  --   --  23  GLUCOSE 115*  --   --   --   --  103*  --  129*  --   --  136*  BUN 36*  --   --   --   --  39*  --  40*  --   --  36*  CREATININE 2.95*  --   --   --   --  3.28*  --  2.92*  --   --  2.69*  CALCIUM  9.0  --   --   --   --  9.2  --  8.4*  --   --  8.3*  AST 48*  --   --   --   --   --   --  48*  --   --  36  ALT 33  --   --   --   --   --   --  27  --   --  25  ALKPHOS 57  --   --   --   --   --   --  51  --   --  51  BILITOT 5.8*  --   --   --   --   --   --  3.3*  --   --  2.4*  ALBUMIN 2.8*  --   --   --   --   --   --  2.2*  --   --  2.2*  MG 1.6*  --   --   --   --   --   --  1.6*  --   --  1.7  PROCALCITON  --   --   --  0.59  --   --   --   --   --   --   --   LATICACIDVEN  --    < > 5.1*  --  >9.0* 7.6*  --   --  3.0* 3.1*  --   BNP  --   --   --  >4,500.0*  --   --   --   --   --   --   --    < > = values in this interval not displayed.    ------------------------------------------------------------------------------------------------------------------ No results for input(s): CHOL, HDL, LDLCALC, TRIG, CHOLHDL, LDLDIRECT in the last 72 hours.  No results found for: HGBA1C ------------------------------------------------------------------------------------------------------------------ No  results for input(s): TSH, T4TOTAL, T3FREE, THYROIDAB in the last 72 hours.  Invalid input(s): FREET3  Cardiac Enzymes No results for input(s): CKMB, TROPONINI, MYOGLOBIN in the last 168 hours.  Invalid input(s): CK ------------------------------------------------------------------------------------------------------------------    Component Value Date/Time   BNP >4,500.0 (H) 02/10/2024 2028    CBG: No results for input(s): GLUCAP in the last 168 hours.  Recent Results (from the past 240 hours)  Resp panel by RT-PCR (RSV, Flu A&B, Covid) Anterior Nasal Swab     Status: None   Collection Time: 02/10/24  8:51 AM   Specimen: Anterior Nasal Swab  Result Value Ref Range Status   SARS Coronavirus 2 by RT PCR NEGATIVE NEGATIVE Final   Influenza A by PCR NEGATIVE NEGATIVE Final   Influenza B by PCR NEGATIVE NEGATIVE Final    Comment: (NOTE) The Xpert Xpress SARS-CoV-2/FLU/RSV plus  assay is intended as an aid in the diagnosis of influenza from Nasopharyngeal swab specimens and should not be used as a sole basis for treatment. Nasal washings and aspirates are unacceptable for Xpert Xpress SARS-CoV-2/FLU/RSV testing.  Fact Sheet for Patients: BloggerCourse.com  Fact Sheet for Healthcare Providers: SeriousBroker.it  This test is not yet approved or cleared by the [place]  FDA and has been authorized for detection and/or diagnosis of SARS-CoV-2 by FDA under an Emergency Use Authorization (EUA). This EUA will remain in effect (meaning this test can be used) for the duration of the COVID-19 declaration under Section 564(b)(1) of the Act, 21 U.S.C. section 360bbb-3(b)(1), unless the authorization is terminated or revoked.     Resp Syncytial Virus by PCR NEGATIVE NEGATIVE Final    Comment: (NOTE) Fact Sheet for Patients: BloggerCourse.com  Fact Sheet for Healthcare  Providers: SeriousBroker.it  This test is not yet approved or cleared by the [place]  FDA and has been authorized for detection and/or diagnosis of SARS-CoV-2 by FDA under an Emergency Use Authorization (EUA). This EUA will remain in effect (meaning this test can be used) for the duration of the COVID-19 declaration under Section 564(b)(1) of the Act, 21 U.S.C. section 360bbb-3(b)(1), unless the authorization is terminated or revoked.  Performed at [facility], [address] [place], [place] [address]   Culture, blood (routine x 2)     Status: None (Preliminary result)   Collection Time: 02/10/24  9:00 AM   Specimen: BLOOD RIGHT HAND  Result Value Ref Range Status   Specimen Description BLOOD RIGHT HAND  Final   Special Requests   Final    BOTTLES DRAWN AEROBIC AND ANAEROBIC Blood Culture results may not be optimal due to an inadequate volume of blood received in culture bottles   Culture   Final    NO GROWTH 3 DAYS Performed at [facility], [address], [place], [place] [address]    Report Status PENDING  Incomplete  Urine Culture     Status: Abnormal   Collection Time: 02/10/24 11:17 AM   Specimen: Urine, Random  Result Value Ref Range Status   Specimen Description URINE, RANDOM  Final   Special Requests   Final    NONE Reflexed from [id number] Performed at [facility], [address] [place], [place] [address]    Culture >=100,000 COLONIES/mL ENTEROCOCCUS FAECALIS (A)  Final   Report Status 02/13/2024 FINAL  Final   Organism ID, Bacteria ENTEROCOCCUS FAECALIS (A)  Final      Susceptibility   Enterococcus faecalis - MIC*    AMPICILLIN <=2 SENSITIVE Sensitive     NITROFURANTOIN <=16 SENSITIVE Sensitive     VANCOMYCIN 1 SENSITIVE Sensitive     * >=100,000 COLONIES/mL ENTEROCOCCUS FAECALIS  Culture, blood (routine x 2)     Status: None (Preliminary result)   Collection Time: 02/10/24  8:28 PM   Specimen: BLOOD RIGHT  HAND  Result Value Ref Range Status   Specimen Description BLOOD RIGHT HAND  Final   Special Requests   Final    BOTTLES DRAWN AEROBIC AND ANAEROBIC Blood Culture results may not be optimal due to an inadequate volume of blood received in culture bottles   Culture   Final    NO GROWTH 3 DAYS Performed at [facility], [address], [place], [place] [address]    Report Status PENDING  Incomplete     Radiology Studies: No results found.  Nilda Fendt, MD,  PhD [facility]  Between 7 am - 7 pm I am available, please contact me via Amion (for emergencies) or Securechat (non urgent messages)  Between 7 pm - 7 am I am not available, please contact night coverage MD/APP via Amion

## 2024-02-13 NOTE — Progress Notes (Addendum)
 Advanced Heart Failure Rounding Note  Cardiologist: Annabella Scarce, MD   Chief Complaint: Cardiogenic shock  Subjective:    CO-OX 54% on 5 DBA.   Scr continues to improve, down to 2.7. Is/Os not accurate. RN reports patient was incontinent of urine overnight. Weight down another 3 lb.   CVP 5.   Feels much better from cardiac standpoint. No dyspnea. Appetite has returned. Only complaint is sensation of incomplete voiding and prostadynia.    Objective:   Weight Range: 63 kg Body mass index is 17.35 kg/m.   Vital Signs:   Temp:  [97.5 F (36.4 C)-98.5 F (36.9 C)] 97.5 F (36.4 C) (07/18 0511) Pulse Rate:  [38-101] 75 (07/18 0600) Resp:  [14-20] 14 (07/18 0600) BP: (98-121)/(61-83) 100/61 (07/18 0511) SpO2:  [98 %-100 %] 100 % (07/18 0600) Weight:  [63 kg-64.3 kg] 63 kg (07/18 0511) Last BM Date : 02/10/24  Weight change: Filed Weights   02/11/24 0634 02/12/24 1100 02/13/24 0511  Weight: 69 kg 64.3 kg 63 kg    Intake/Output:   Intake/Output Summary (Last 24 hours) at 02/13/2024 0651 Last data filed at 02/13/2024 0600 Gross per 24 hour  Intake 1255 ml  Output 975 ml  Net 280 ml      Physical Exam    General:  Cachectic, chronically ill appearing Neck: JVP ~ 12 Cor: Regular rate & rhythm. No rubs, gallops or murmurs. Lungs: Clear Abdomen: Soft, nontender, nondistended.  Extremities: Trace edema Neuro: Alert & orientedx3. Affect flat.   Telemetry   SR 90s, ~ 20 PVCs/min, brief runs NSVT   Labs    CBC Recent Labs    02/10/24 0900 02/11/24 0326 02/12/24 0240 02/13/24 0448  WBC 4.3   < > 5.6 4.1  NEUTROABS 3.8  --   --   --   HGB 8.8*   < > 8.2* 8.4*  HCT 29.5*   < > 25.6* 26.2*  MCV 78.0*   < > 74.4* 73.2*  PLT 96*   < > 90* 91*   < > = values in this interval not displayed.   Basic Metabolic Panel Recent Labs    92/82/74 0240 02/13/24 0448  NA 135 134*  K 3.9 3.5  CL 105 101  CO2 18* 23  GLUCOSE 129* 136*  BUN 40* 36*   CREATININE 2.92* 2.69*  CALCIUM  8.4* 8.3*  MG 1.6* 1.7  PHOS 2.7 2.1*   Liver Function Tests Recent Labs    02/12/24 0240 02/13/24 0448  AST 48* 36  ALT 27 25  ALKPHOS 51 51  BILITOT 3.3* 2.4*  PROT 5.5* 5.6*  ALBUMIN 2.2* 2.2*   No results for input(s): LIPASE, AMYLASE in the last 72 hours. Cardiac Enzymes No results for input(s): CKTOTAL, CKMB, CKMBINDEX, TROPONINI in the last 72 hours.  BNP: BNP (last 3 results) Recent Labs    06/14/23 2358 06/24/23 1511 02/10/24 2028  BNP 2,436.6* >4,500.0* >4,500.0*    ProBNP (last 3 results) No results for input(s): PROBNP in the last 8760 hours.   D-Dimer No results for input(s): DDIMER in the last 72 hours. Hemoglobin A1C No results for input(s): HGBA1C in the last 72 hours. Fasting Lipid Panel No results for input(s): CHOL, HDL, LDLCALC, TRIG, CHOLHDL, LDLDIRECT in the last 72 hours. Thyroid Function Tests No results for input(s): TSH, T4TOTAL, T3FREE, THYROIDAB in the last 72 hours.  Invalid input(s): FREET3  Other results:   Imaging    No results found.    Medications:  Scheduled Medications:  acetaminophen   1,000 mg Oral Once   atorvastatin   40 mg Oral QPM   Chlorhexidine  Gluconate Cloth  6 each Topical Daily   feeding supplement  237 mL Oral TID BM   folic acid   1 mg Oral Daily   furosemide   80 mg Intravenous BID   guaiFENesin   600 mg Oral BID   multivitamin with minerals  1 tablet Oral Daily   sodium bicarbonate   650 mg Oral TID   sodium chloride  flush  10-40 mL Intracatheter Q12H   sodium chloride  flush  3 mL Intravenous Q12H   thiamine   100 mg Oral Daily    Infusions:  DOBUTamine  5 mcg/kg/min (02/12/24 1110)    PRN Medications: acetaminophen  **OR** acetaminophen , albuterol , melatonin, ondansetron  **OR** ondansetron  (ZOFRAN ) IV, sodium chloride  flush    Patient Profile  73 y.o. male with history of chronic systolic heart failure, CAD, COPD,  CKD IV, HTN, HLD, hx cocaine abuse, tobacco use, recurrent UTIs and poor compliance, admitted w/ a/c systolic heart failure w/ low-output.   Assessment/Plan   1. Acute on Chronic Systolic Heart Failure w/ Cardiogenic Shock  - Diagnosed with HF in 2019 while living in [place]  - NICM. Has CAD but severity of CM out of proportion to degree of CAD - cocaine and ETOH use likely contributing   - EF has been in 25-30% range, most recently < 20% on echo 04/24. Offered ICD but has declined - now admitted w/ NYHA IV symptoms and volume overload w/ CS, LA > 9.0. Echo w/ Bi-V failure, EF 20-25%, RV severely reduced  - On inotropes for the sole purpose to aide in palliative diuresis  - CO-OX 54% on 5 DBA. Lactic acid clearing. Stop sodium bicarb. Wean DBA to 2.5 and check CO-Ox this afternoon.  - CVP 5. Stop IV diuretics. Start po Torsemide tomorrow.  - CKD and soft BP limits additional GDMT currently  - Discussed importance of adherence with medical therapy - Suspect end-stage HF, cardiac cachexia and worsening renal failure.  He is not a candidate for advanced therapies given substance use and poor compliance.  Not a candidate for home inotropes given substance use.  Palliative care consult for GOC discussions.  2. PVCs - Frequent on telemetry - Start mexiletine 200 BID - Supp K and Mag   3. AKI on CKD IV  - b/l SCr 2.5. Peaked at 3.3, now improving - cardiorenal from low output  - support CO  - no SLGT2i given h/o frequent UTIs    4. Hyperkalemia - In setting of ARF - given lokelma  - Now resolved   5. Polysubstance Use - UDS + for cocaine  - needs to quit    6. ID. ? CAP + UTI  - No leukocytosis and Afebrile. Procal 0.59 - Treated initially with empiric abx, now stopped d/t low suspicion for active infection.    Length of Stay: 3  FINCH, LINDSAY N, PA-C  02/13/2024, 6:51 AM  Advanced Heart Failure Team Pager [phone] (M-F; 7a - 5p)  Please contact [facility] for  night-coverage after hours (5p -7a ) and weekends on [email or web address]  Patient seen and examined with the above-signed Advanced Practice Provider and/or Housestaff. I personally reviewed laboratory data, imaging studies and relevant notes. I independently examined the patient and formulated the important aspects of the plan. I have edited the note to reflect any of my changes or salient points. I have personally discussed the plan with the patient and/or family.  Remains on DBA  5. Shock has resolved. CO-ox improved but remains marginal. Volume status ok CVP  Only complaint is that he is cold  Frequent PVCs on tele  General:  Lying in bed with head under blankets No resp difficulty HEENT: normal Neck: supple. no JVD. Carotids 2+ bilat; no bruits. No lymphadenopathy or thryomegaly appreciated. Cor: RRR + s3 Lungs: clear Abdomen: soft, nontender, nondistended. No hepatosplenomegaly. No bruits or masses. Good bowel sounds. Extremities: no cyanosis, clubbing, rash, edema Neuro: alert & orientedx3, cranial nerves grossly intact. moves all 4 extremities w/o difficulty. Affect pleasant  He has end-stage HF complicated by ongoing polysubstance abuse and CKD IV.   Not candidate for home inotropes or advanced therapies  Will drop DBA to 2.5 today and likely off tomorrow. Will stop checking co-ox and go by clinical assessment.   Possibly home tomorrow. Need to renew HF meds. (No b-blocker) Start mexilitene 200 bid for PVCs.  Toribio Fuel, MD  9:25 AM

## 2024-02-13 NOTE — Progress Notes (Signed)
 Nurse tech alerted Rn for low blood pressure and patient complaints of dizziness and see dark. Blood pressure rechecked and blood pressure remained low. Patient up ion cfhair. Cool to touch but dry. Patient answered questions appropriately. Patient never blacked out. Rapid response called. IV 250ml bolus given and patient's blood pressure improved. Patient back to bed. PA for CHF present and update given. MD to see patient. Cvp of 1 obtained by rapid response nurse. Blood pressure continues to improve.

## 2024-02-13 NOTE — Plan of Care (Signed)

## 2024-02-13 NOTE — Evaluation (Signed)
 Occupational Therapy Evaluation Patient Details Name: Joseph Tanner MRN: [id number] DOB: 1951/01/22 Today's Date: 02/13/2024   History of Present Illness   73 y.o. male presents to [facility]/10/24 with SOB. Chest x-ray concerning for aspiration or PNA, +UTI, + cocaine. PMHx: hypertension, hyperlipidemia, combined systolic and diastolic CHF, NSTEMI, CAD s/p PCI, COPD, CKD stage IV, and cocaine abuse     Clinical Impressions Pt admitted based on above, and was seen based on problem list below. PTA pt was independent with ADLs and IADLs. Today pt is requiring set up  to min assist  for ADLs. Bed mobility and functional transfers are  CGA with use of RW. Pain and activity tolerance are limiting pt's performance. Recommendation of HHOT. OT will continue to follow acutely to maximize functional independence.     If plan is discharge home, recommend the following:   A little help with walking and/or transfers;A little help with bathing/dressing/bathroom;Assistance with cooking/housework     Functional Status Assessment   Patient has had a recent decline in their functional status and demonstrates the ability to make significant improvements in function in a reasonable and predictable amount of time.     Equipment Recommendations   BSC/3in1      Precautions/Restrictions   Precautions Precautions: Fall Recall of Precautions/Restrictions: Intact Restrictions Weight Bearing Restrictions Per Provider Order: No     Mobility Bed Mobility Overal bed mobility: Modified Independent     General bed mobility comments: No assist, increased time    Transfers Overall transfer level: Needs assistance Equipment used: Rolling walker (2 wheels) Transfers: Sit to/from Stand, Bed to chair/wheelchair/BSC Sit to Stand: Contact guard assist     Step pivot transfers: Contact guard assist     General transfer comment: CGA for short distance, slow and painful      Balance Overall  balance assessment: Needs assistance Sitting-balance support: Bilateral upper extremity supported, Feet supported Sitting balance-Leahy Scale: Good     Standing balance support: No upper extremity supported Standing balance-Leahy Scale: Fair       ADL either performed or assessed with clinical judgement   ADL Overall ADL's : Needs assistance/impaired Eating/Feeding: Set up;Sitting   Grooming: Set up;Sitting           Upper Body Dressing : Set up;Sitting   Lower Body Dressing: Sit to/from stand;Minimal assistance Lower Body Dressing Details (indicate cue type and reason): Assist to reach BLEs Toilet Transfer: Contact guard assist;Ambulation;Rolling walker (2 wheels) Toilet Transfer Details (indicate cue type and reason): CGA for short step pivot to recliner Toileting- Clothing Manipulation and Hygiene: Contact guard assist;Sit to/from stand       Functional mobility during ADLs: Contact guard assist;Rolling walker (2 wheels) General ADL Comments: Pain in scrotum limiting LB ADLs     Vision Baseline Vision/History: 0 No visual deficits Vision Assessment?: No apparent visual deficits            Pertinent Vitals/Pain Pain Assessment Pain Assessment: Faces Faces Pain Scale: Hurts even more Pain Location: Scrotum Pain Descriptors / Indicators: Discomfort Pain Intervention(s): Limited activity within patient's tolerance     Extremity/Trunk Assessment Upper Extremity Assessment Upper Extremity Assessment: Generalized weakness   Lower Extremity Assessment Lower Extremity Assessment: Defer to PT evaluation   Cervical / Trunk Assessment Cervical / Trunk Assessment: Normal   Communication Communication Communication: No apparent difficulties   Cognition Arousal: Alert Behavior During Therapy: Flat affect Cognition: No apparent impairments   Following commands: Intact       Cueing  General Comments   Cueing Techniques: Verbal cues  VSS on RA            Home Living Family/patient expects to be discharged to:: Private residence Living Arrangements: Alone Available Help at Discharge: Family;Available PRN/intermittently Type of Home: Apartment Home Access: Elevator;Stairs to enter Entrance Stairs-Number of Steps: 14/1 flight-only if elevator out of service Entrance Stairs-Rails: Can reach both Home Layout: One level     Bathroom Shower/Tub: Producer, television/film/video: Standard Bathroom Accessibility: Yes   Home Equipment: Cane - single point;Rolling Walker (2 wheels)          Prior Functioning/Environment Prior Level of Function : Independent/Modified Independent;Driving       Mobility Comments: Mod I reports use of RW or no AD      OT Problem List: Decreased range of motion;Decreased strength;Decreased activity tolerance;Impaired balance (sitting and/or standing);Decreased knowledge of use of DME or AE;Cardiopulmonary status limiting activity   OT Treatment/Interventions: Self-care/ADL training;Therapeutic exercise;DME and/or AE instruction;Therapeutic activities;Patient/family education;Balance training      OT Goals(Current goals can be found in the care plan section)   Acute Rehab OT Goals Patient Stated Goal: To rest OT Goal Formulation: With patient Time For Goal Achievement: 02/27/24 Potential to Achieve Goals: Good   OT Frequency:  Min 2X/week       AM-PAC OT 6 Clicks Daily Activity     Outcome Measure Help from another person eating meals?: None Help from another person taking care of personal grooming?: A Little Help from another person toileting, which includes using toliet, bedpan, or urinal?: A Little Help from another person bathing (including washing, rinsing, drying)?: A Little Help from another person to put on and taking off regular upper body clothing?: A Little Help from another person to put on and taking off regular lower body clothing?: A Little 6 Click Score: 19   End of  Session Equipment Utilized During Treatment: Rolling walker (2 wheels) Nurse Communication: Mobility status  Activity Tolerance: Patient limited by pain Patient left: in chair;with call bell/phone within reach;with chair alarm set  OT Visit Diagnosis: Unsteadiness on feet (R26.81);Other abnormalities of gait and mobility (R26.89);Muscle weakness (generalized) (M62.81)                Time: 9148-9085 OT Time Calculation (min): 23 min Charges:  OT General Charges $OT Visit: 1 Visit OT Evaluation $OT Eval Moderate Complexity: 1 Mod OT Treatments $Self Care/Home Management : 8-22 mins  Adrianne BROCKS, OT  Acute Rehabilitation Services Office [phone] Secure chat preferred    Adrianne GORMAN Savers 02/13/2024, 11:49 AM

## 2024-02-14 DIAGNOSIS — R570 Cardiogenic shock: Secondary | ICD-10-CM | POA: Diagnosis not present

## 2024-02-14 DIAGNOSIS — Z515 Encounter for palliative care: Secondary | ICD-10-CM | POA: Diagnosis not present

## 2024-02-14 DIAGNOSIS — J189 Pneumonia, unspecified organism: Secondary | ICD-10-CM | POA: Diagnosis not present

## 2024-02-14 DIAGNOSIS — R0600 Dyspnea, unspecified: Secondary | ICD-10-CM

## 2024-02-14 DIAGNOSIS — Z7189 Other specified counseling: Secondary | ICD-10-CM | POA: Diagnosis not present

## 2024-02-14 LAB — COMPREHENSIVE METABOLIC PANEL WITH GFR
ALT: 22 U/L (ref 0–44)
AST: 28 U/L (ref 15–41)
Albumin: 2.1 g/dL — ABNORMAL LOW (ref 3.5–5.0)
Alkaline Phosphatase: 59 U/L (ref 38–126)
Anion gap: 12 (ref 5–15)
BUN: 38 mg/dL — ABNORMAL HIGH (ref 8–23)
CO2: 25 mmol/L (ref 22–32)
Calcium: 8.1 mg/dL — ABNORMAL LOW (ref 8.9–10.3)
Chloride: 97 mmol/L — ABNORMAL LOW (ref 98–111)
Creatinine, Ser: 2.55 mg/dL — ABNORMAL HIGH (ref 0.61–1.24)
GFR, Estimated: 26 mL/min — ABNORMAL LOW (ref >60)
Glucose, Bld: 120 mg/dL — ABNORMAL HIGH (ref 70–99)
Potassium: 4.4 mmol/L (ref 3.5–5.1)
Sodium: 134 mmol/L — ABNORMAL LOW (ref 135–145)
Total Bilirubin: 2.1 mg/dL — ABNORMAL HIGH (ref 0.0–1.2)
Total Protein: 5.2 g/dL — ABNORMAL LOW (ref 6.5–8.1)

## 2024-02-14 LAB — CBC
HCT: 25.7 % — ABNORMAL LOW (ref 39.0–52.0)
Hemoglobin: 8.2 g/dL — ABNORMAL LOW (ref 13.0–17.0)
MCH: 23.6 pg — ABNORMAL LOW (ref 26.0–34.0)
MCHC: 31.9 g/dL (ref 30.0–36.0)
MCV: 73.9 fL — ABNORMAL LOW (ref 80.0–100.0)
Platelets: 84 K/uL — ABNORMAL LOW (ref 150–400)
RBC: 3.48 MIL/uL — ABNORMAL LOW (ref 4.22–5.81)
RDW: 22.6 % — ABNORMAL HIGH (ref 11.5–15.5)
WBC: 3.9 K/uL — ABNORMAL LOW (ref 4.0–10.5)
nRBC: 0 % (ref 0.0–0.2)

## 2024-02-14 LAB — MAGNESIUM: Magnesium: 2.5 mg/dL — ABNORMAL HIGH (ref 1.7–2.4)

## 2024-02-14 LAB — PHOSPHORUS: Phosphorus: 2.2 mg/dL — ABNORMAL LOW (ref 2.5–4.6)

## 2024-02-14 MED ORDER — SODIUM CHLORIDE 0.9 % IV BOLUS
250.0000 mL | Freq: Once | INTRAVENOUS | Status: AC
  Administered 2024-02-14: 250 mL via INTRAVENOUS

## 2024-02-14 MED ORDER — POTASSIUM & SODIUM PHOSPHATES 280-160-250 MG PO PACK
1.0000 | PACK | Freq: Three times a day (TID) | ORAL | Status: AC
  Administered 2024-02-14 (×3): 1 via ORAL
  Filled 2024-02-14 (×3): qty 1

## 2024-02-14 MED ORDER — SODIUM CHLORIDE 0.9 % IV BOLUS: 250.0000 mL | Freq: Once | INTRAVENOUS | Status: AC

## 2024-02-14 NOTE — Progress Notes (Signed)
 [facility] [phone] [facility] Liaison Note:   Referral received for possible Home with Hospice Services. Liaison plans to meet with patient to discuss hospice services.      Thank you for the opportunity to participate in this patient's plan of care.   Nat Babe, BSN, [employer] [phone]

## 2024-02-14 NOTE — Progress Notes (Signed)
 PROGRESS NOTE  Joseph Tanner FMW:[id number] DOB: 1951/04/20 DOA: 02/10/2024 PCP: Pcp, No   LOS: 4 days   Brief Narrative / Interim history: 73 y.o. male with medical history significant of hypertension, hyperlipidemia, combined systolic and diastolic CHF, NSTEMI, CAD s/p PCI, COPD, CKD stage IV, and cocaine abuse presents with shortness of breath.  Patient reports similar episode last November, he was hospitalized, improved and sent home.  Has not had any follow-up since.  Current symptoms started for the past week or so.  He was found to be fluid overload in the ER, admitted to the hospital and placed on diuretics.  Cardiology was consulted.  He reports he quit tobacco and alcohol for several months, last time he used cocaine was about 4 weeks ago  Subjective / 24h Interval events: Feeling well today.  No chest pain, no shortness of breath.  Feels like his swelling is improved had an episode of significant hypotension yesterday with a dobutamine  being weaned off, there are concerns that he may not tolerate this.  Palliative care engaged.  Assesement and Plan: Principal problem Acute on chronic combined CHF -patient admitted to the hospital with significant fluid overload.  Most recent 2D echo last year showed EF 15% with grade 3 diastolic dysfunction.  2D echocardiogram done 02/11/2024 shows LVEF 20-25%, global hypokinesis, RV systolic function was also severely reduced - Cardiology consulted, appreciate input, patient was started on dobutamine  and transferred to progressive.  Continue dobutamine , furosemide  on hold as yesterday had an episode of hypotension.  Wean off dobutamine  per cardiology, may not tolerate.  Palliative care engaged  Active problems Acute kidney injury on chronic kidney disease stage IV -baseline creatinine 2.2-2.4.  Up to 3.2 on 7/16, continues to improve and has remained stable around 2.5  Possible pneumonia -based on the CT scan, but not sure that he truly has  pneumonia with evidence of fluid overload, no fever, low procalcitonin and no white count elevation.  Cultures have remained negative, he is afebrile without any respiratory symptoms, antibiotics were discontinued 7/17  ?  Urinary retention -start Flomax   Elevated lactic acid, anion gap metabolic acidosis-with concern of poor perfusion, renal failure.  He was briefly on sodium bicarb, now discontinued.  Acidosis resolved  Diarrhea-resolved  UTI-asymptomatic, this is likely bacteriuria  CAD, elevated troponin -likely demand ischemia, not in a pattern consistent with ACS.  No chest pain.  Left heart cath March 2022 with severe stenosis small Lcx and mild moderate stenosis RCA that was to be treated medically.   Hyperkalemia-due to renal failure.  Potassium stabilized and is normal this morning.  Hypophosphatemia -replenish and monitor  Hypomagnesemia-magnesium  on the high side today  Cocaine use -none for the past 4 weeks per patient, however UDS still positive  Thrombocytopenia, anemia -fairly chronic, no bleeding, monitor  Scheduled Meds:  acetaminophen   1,000 mg Oral Once   atorvastatin   40 mg Oral QPM   Chlorhexidine  Gluconate Cloth  6 each Topical Daily   feeding supplement  237 mL Oral TID BM   folic acid   1 mg Oral Daily   guaiFENesin   600 mg Oral BID   mexiletine  200 mg Oral Q12H   multivitamin with minerals  1 tablet Oral Daily   sodium chloride  flush  10-40 mL Intracatheter Q12H   sodium chloride  flush  3 mL Intravenous Q12H   tamsulosin   0.4 mg Oral QPC breakfast   thiamine   100 mg Oral Daily   Continuous Infusions:  DOBUTamine  2.5 mcg/kg/min (02/14/24  0700)   PRN Meds:.acetaminophen  **OR** acetaminophen , albuterol , melatonin, ondansetron  **OR** ondansetron  (ZOFRAN ) IV, sodium chloride  flush  Current Outpatient Medications  Medication Instructions   albuterol  (VENTOLIN  HFA) 108 (90 Base) MCG/ACT inhaler 2 puffs, Every 6 hours PRN   atorvastatin  (LIPITOR) 40 mg,  Oral, Every evening   carvedilol  (COREG ) 3.125 mg, Oral, Daily   ibuprofen (ADVIL) 1,600 mg, Oral, As needed   isosorbide-hydrALAZINE  (BIDIL) 20-37.5 MG tablet 0.5 tablets, Oral, 3 times daily   Multiple Vitamin (MULTIVITAMIN WITH MINERALS) TABS tablet 1 tablet, Oral, Daily   sacubitril -valsartan  (ENTRESTO ) 24-26 MG 1 tablet, Oral, Every morning   spironolactone  (ALDACTONE ) 25 mg, Oral, Daily    Diet Orders (From admission, onward)     Start     Ordered   02/12/24 1502  Diet 2 gram sodium Room service appropriate? Yes with Assist; Fluid consistency: Thin  Diet effective now       Question Answer Comment  Room service appropriate? Yes with Assist   Fluid consistency: Thin      02/12/24 1502            DVT prophylaxis: SCDs Start: 02/10/24 1140   Lab Results  Component Value Date   PLT 84 (L) 02/14/2024      Code Status: Full Code  Family Communication: No family at bedside  Status is: Inpatient Remains inpatient appropriate because: Severity of illness   Level of care: Progressive  Consultants:  Cardiology  Objective: Vitals:   02/14/24 0415 02/14/24 0600 02/14/24 0630 02/14/24 0822  BP:    103/64  Pulse: 80 81  79  Resp: 15 19  18   Temp: 97.8 F (36.6 C)   97.7 F (36.5 C)  TempSrc: Oral   Oral  SpO2: 100% 100%  100%  Weight:   63.3 kg   Height:        Intake/Output Summary (Last 24 hours) at 02/14/2024 0932 Last data filed at 02/14/2024 0824 Gross per 24 hour  Intake 766.67 ml  Output 1700 ml  Net -933.33 ml   Wt Readings from Last 3 Encounters:  02/14/24 63.3 kg  06/24/23 67 kg  06/14/23 64.6 kg    Examination:  Constitutional: NAD Eyes: lids and conjunctivae normal, no scleral icterus ENMT: mmm Neck: normal, supple Respiratory: clear to auscultation bilaterally, no wheezing, no crackles. Normal respiratory effort.  Cardiovascular: Regular rate and rhythm, no murmurs / rubs / gallops. No LE edema. Abdomen: soft, no distention, no  tenderness. Bowel sounds positive.   Data Reviewed: I have independently reviewed following labs and imaging studies   CBC Recent Labs  Lab 02/10/24 0900 02/11/24 0326 02/12/24 0240 02/13/24 0448 02/14/24 0624  WBC 4.3 5.3 5.6 4.1 3.9*  HGB 8.8* 8.2* 8.2* 8.4* 8.2*  HCT 29.5* 27.3* 25.6* 26.2* 25.7*  PLT 96* 86* 90* 91* 84*  MCV 78.0* 77.6* 74.4* 73.2* 73.9*  MCH 23.3* 23.3* 23.8* 23.5* 23.6*  MCHC 29.8* 30.0 32.0 32.1 31.9  RDW 23.3* 23.2* 23.0* 22.5* 22.6*  LYMPHSABS 0.3*  --   --   --   --   MONOABS 0.2  --   --   --   --   EOSABS 0.0  --   --   --   --   BASOSABS 0.0  --   --   --   --     Recent Labs  Lab 02/10/24 0900 02/10/24 1103 02/10/24 1339 02/10/24 2028 02/10/24 2142 02/11/24 0326 02/11/24 1508 02/12/24 0240 02/12/24 1030 02/12/24 1440 02/13/24  0448 02/14/24 0624  NA 136  --   --   --   --  139  --  135  --   --  134* 134*  K 5.6*  --   --   --   --  5.9* 4.4 3.9  --   --  3.5 4.4  CL 108  --   --   --   --  109  --  105  --   --  101 97*  CO2 15*  --   --   --   --  11*  --  18*  --   --  23 25  GLUCOSE 115*  --   --   --   --  103*  --  129*  --   --  136* 120*  BUN 36*  --   --   --   --  39*  --  40*  --   --  36* 38*  CREATININE 2.95*  --   --   --   --  3.28*  --  2.92*  --   --  2.69* 2.55*  CALCIUM  9.0  --   --   --   --  9.2  --  8.4*  --   --  8.3* 8.1*  AST 48*  --   --   --   --   --   --  48*  --   --  36 28  ALT 33  --   --   --   --   --   --  27  --   --  25 22  ALKPHOS 57  --   --   --   --   --   --  51  --   --  51 59  BILITOT 5.8*  --   --   --   --   --   --  3.3*  --   --  2.4* 2.1*  ALBUMIN 2.8*  --   --   --   --   --   --  2.2*  --   --  2.2* 2.1*  MG 1.6*  --   --   --   --   --   --  1.6*  --   --  1.7 2.5*  PROCALCITON  --   --   --  0.59  --   --   --   --   --   --   --   --   LATICACIDVEN  --    < > 5.1*  --  >9.0* 7.6*  --   --  3.0* 3.1*  --   --   BNP  --   --   --  >4,500.0*  --   --   --   --   --   --   --   --     < > = values in this interval not displayed.    ------------------------------------------------------------------------------------------------------------------ No results for input(s): CHOL, HDL, LDLCALC, TRIG, CHOLHDL, LDLDIRECT in the last 72 hours.  No results found for: HGBA1C ------------------------------------------------------------------------------------------------------------------ No results for input(s): TSH, T4TOTAL, T3FREE, THYROIDAB in the last 72 hours.  Invalid input(s): FREET3  Cardiac Enzymes No results for input(s): CKMB, TROPONINI, MYOGLOBIN in the last 168 hours.  Invalid input(s): CK ------------------------------------------------------------------------------------------------------------------    Component Value Date/Time   BNP >4,500.0 (H) 02/10/2024 2028    CBG: No results  for input(s): GLUCAP in the last 168 hours.  Recent Results (from the past 240 hours)  Resp panel by RT-PCR (RSV, Flu A&B, Covid) Anterior Nasal Swab     Status: None   Collection Time: 02/10/24  8:51 AM   Specimen: Anterior Nasal Swab  Result Value Ref Range Status   SARS Coronavirus 2 by RT PCR NEGATIVE NEGATIVE Final   Influenza A by PCR NEGATIVE NEGATIVE Final   Influenza B by PCR NEGATIVE NEGATIVE Final    Comment: (NOTE) The Xpert Xpress SARS-CoV-2/FLU/RSV plus assay is intended as an aid in the diagnosis of influenza from Nasopharyngeal swab specimens and should not be used as a sole basis for treatment. Nasal washings and aspirates are unacceptable for Xpert Xpress SARS-CoV-2/FLU/RSV testing.  Fact Sheet for Patients: BloggerCourse.com  Fact Sheet for Healthcare Providers: SeriousBroker.it  This test is not yet approved or cleared by the [place]  FDA and has been authorized for detection and/or diagnosis of SARS-CoV-2 by FDA under an Emergency Use Authorization (EUA).  This EUA will remain in effect (meaning this test can be used) for the duration of the COVID-19 declaration under Section 564(b)(1) of the Act, 21 U.S.C. section 360bbb-3(b)(1), unless the authorization is terminated or revoked.     Resp Syncytial Virus by PCR NEGATIVE NEGATIVE Final    Comment: (NOTE) Fact Sheet for Patients: BloggerCourse.com  Fact Sheet for Healthcare Providers: SeriousBroker.it  This test is not yet approved or cleared by the [place]  FDA and has been authorized for detection and/or diagnosis of SARS-CoV-2 by FDA under an Emergency Use Authorization (EUA). This EUA will remain in effect (meaning this test can be used) for the duration of the COVID-19 declaration under Section 564(b)(1) of the Act, 21 U.S.C. section 360bbb-3(b)(1), unless the authorization is terminated or revoked.  Performed at [facility], [address], [place], [place] [address]   Culture, blood (routine x 2)     Status: None (Preliminary result)   Collection Time: 02/10/24  9:00 AM   Specimen: BLOOD RIGHT HAND  Result Value Ref Range Status   Specimen Description BLOOD RIGHT HAND  Final   Special Requests   Final    BOTTLES DRAWN AEROBIC AND ANAEROBIC Blood Culture results may not be optimal due to an inadequate volume of blood received in culture bottles   Culture   Final    NO GROWTH 4 DAYS Performed at [facility], [address], [place], [place] [address]    Report Status PENDING  Incomplete  Urine Culture     Status: Abnormal   Collection Time: 02/10/24 11:17 AM   Specimen: Urine, Random  Result Value Ref Range Status   Specimen Description URINE, RANDOM  Final   Special Requests   Final    NONE Reflexed from [id number] Performed at [facility], [address], [place], [place] [address]    Culture >=100,000 COLONIES/mL ENTEROCOCCUS FAECALIS (A)  Final   Report Status 02/13/2024 FINAL  Final    Organism ID, Bacteria ENTEROCOCCUS FAECALIS (A)  Final      Susceptibility   Enterococcus faecalis - MIC*    AMPICILLIN <=2 SENSITIVE Sensitive     NITROFURANTOIN <=16 SENSITIVE Sensitive     VANCOMYCIN 1 SENSITIVE Sensitive     * >=100,000 COLONIES/mL ENTEROCOCCUS FAECALIS  Culture, blood (routine x 2)     Status: None (Preliminary result)   Collection Time: 02/10/24  8:28 PM   Specimen: BLOOD RIGHT HAND  Result  Value Ref Range Status   Specimen Description BLOOD RIGHT HAND  Final   Special Requests   Final    BOTTLES DRAWN AEROBIC AND ANAEROBIC Blood Culture results may not be optimal due to an inadequate volume of blood received in culture bottles   Culture   Final    NO GROWTH 4 DAYS Performed at [facility], [address] [place], [place] [address]    Report Status PENDING  Incomplete     Radiology Studies: No results found.  Nilda Fendt, MD, PhD [facility]  Between 7 am - 7 pm I am available, please contact me via Amion (for emergencies) or Securechat (non urgent messages)  Between 7 pm - 7 am I am not available, please contact night coverage MD/APP via Amion

## 2024-02-14 NOTE — Progress Notes (Signed)
 Progress Note  Patient Name: Joseph Tanner Date of Encounter: 02/14/2024  Primary Cardiologist: Annabella Scarce, MD   Subjective   Patient seen and examined at his bedside. He was awake and offers no complaints at this time.   Inpatient Medications    Scheduled Meds:  acetaminophen   1,000 mg Oral Once   atorvastatin   40 mg Oral QPM   Chlorhexidine  Gluconate Cloth  6 each Topical Daily   feeding supplement  237 mL Oral TID BM   folic acid   1 mg Oral Daily   guaiFENesin   600 mg Oral BID   mexiletine  200 mg Oral Q12H   multivitamin with minerals  1 tablet Oral Daily   sodium chloride  flush  10-40 mL Intracatheter Q12H   sodium chloride  flush  3 mL Intravenous Q12H   tamsulosin   0.4 mg Oral QPC breakfast   thiamine   100 mg Oral Daily   Continuous Infusions:  DOBUTamine  2.5 mcg/kg/min (02/14/24 0700)   PRN Meds: acetaminophen  **OR** acetaminophen , albuterol , melatonin, ondansetron  **OR** ondansetron  (ZOFRAN ) IV, sodium chloride  flush   Vital Signs    Vitals:   02/14/24 0415 02/14/24 0600 02/14/24 0630 02/14/24 0822  BP:    103/64  Pulse: 80 81  79  Resp: 15 19  18   Temp: 97.8 F (36.6 C)   97.7 F (36.5 C)  TempSrc: Oral   Oral  SpO2: 100% 100%  100%  Weight:   63.3 kg   Height:        Intake/Output Summary (Last 24 hours) at 02/14/2024 0851 Last data filed at 02/14/2024 0824 Gross per 24 hour  Intake 766.67 ml  Output 1700 ml  Net -933.33 ml   Filed Weights   02/12/24 1100 02/13/24 0511 02/14/24 0630  Weight: 64.3 kg 63 kg 63.3 kg    Telemetry    Sinus rhythm - Personally Reviewed  ECG     - Personally Reviewed  Physical Exam    General: Comfortable, in bed Head: Atraumatic, normal size  Eyes: PEERLA, EOMI  Neck: Supple, normal JVD Cardiac: Normal S1, S2; RRR; no murmurs, rubs, or gallops Lungs: Clear to auscultation bilaterally Abd: Soft, nontender, no hepatomegaly  Ext: warm, no edema Musculoskeletal: No deformities, BUE and  BLE strength normal and equal Skin: Warm and dry, no rashes   Neuro: Alert and oriented to person, place, time, and situation, CNII-XII grossly intact, no focal deficits  Psych: Normal mood and affect   Labs    Chemistry Recent Labs  Lab 02/12/24 0240 02/13/24 0448 02/14/24 0624  NA 135 134* 134*  K 3.9 3.5 4.4  CL 105 101 97*  CO2 18* 23 25  GLUCOSE 129* 136* 120*  BUN 40* 36* 38*  CREATININE 2.92* 2.69* 2.55*  CALCIUM  8.4* 8.3* 8.1*  PROT 5.5* 5.6* 5.2*  ALBUMIN 2.2* 2.2* 2.1*  AST 48* 36 28  ALT 27 25 22   ALKPHOS 51 51 59  BILITOT 3.3* 2.4* 2.1*  GFRNONAA 22* 24* 26*  ANIONGAP 12 10 12      Hematology Recent Labs  Lab 02/12/24 0240 02/13/24 0448 02/14/24 0624  WBC 5.6 4.1 3.9*  RBC 3.44* 3.58* 3.48*  HGB 8.2* 8.4* 8.2*  HCT 25.6* 26.2* 25.7*  MCV 74.4* 73.2* 73.9*  MCH 23.8* 23.5* 23.6*  MCHC 32.0 32.1 31.9  RDW 23.0* 22.5* 22.6*  PLT 90* 91* 84*    Cardiac EnzymesNo results for input(s): TROPONINI in the last 168 hours. No results for input(s): TROPIPOC in the last 168  hours.   BNP Recent Labs  Lab 02/10/24 2028  BNP >4,500.0*     DDimer No results for input(s): DDIMER in the last 168 hours.   Radiology    No results found.  Cardiac Studies   Echo from 02/11/2024  Patient Profile     73 y.o. male with history of chronic systolic heart failure, CAD, COPD, CKD IV, HTN, HLD, hx cocaine abuse, tobacco use, recurrent UTIs and poor compliance, admitted w/ a/c systolic heart failure w/ low-output.   Assessment & Plan    Acute on chronic systolic heart failure with cardiogenic shock PVCs Acute on stage IV kidney failure  Polysubstance Use UTI vs CAP  Electrolyte Abnormalities   Clinically he does not appear to be volume overloaded.  His dobutamine  was dropped yesterday to 2.5 at that time he became slightly hypotensive and resolved with IV fluids.  I am going to stop the dobutamine  today we will watch him closely and if he becomes  hypotensive will consider another 250 bolus and likely back on dobutamine  and may not be able to be discharged this weekend if that has happens.  However if he tolerates the wean and being off of this in does not become hypotensive we can plan for discharge to home.  He appeared to be volume depleted as well and responded to the bolus so after stopping the dobutamine  he please give him another 250 bolus likely this will help with the transition.  He has discussed with our heart failure team and palliative care consultation has been discussed as well.  Continue all of the diuretics for now. Continue his current 200 mg twice daily dose of mexiletine. Creatinine improved today 2.55 we will continue to monitor.  Avoid nephrotoxins  Will share plan with the team   For questions or updates, please contact [facility] Please consult [email or web address] for contact info under Cardiology/STEMI.      Signed, Pinkney Venard, DO  02/14/2024, 8:51 AM

## 2024-02-14 NOTE — Progress Notes (Signed)
 Daily Progress Note   Date: 02/14/2024   Patient Name: Joseph Tanner  DOB: [birth year]  MRN: [id number]  Age / Sex: 73 y.o., male  Attending Physician: Trixie Nilda HERO, MD Primary Care Physician: Pcp, No Admit Date: 02/10/2024 Length of Stay: 4 days  Reason for Consultation: Establishing goals of care  Past Medical History:  Diagnosis Date  . COPD (chronic obstructive pulmonary disease) (HCC)   . Hyperlipemia   . Hypertension   . NSTEMI (non-ST elevated myocardial infarction) (HCC)   . Pure hypercholesterolemia 03/15/2021    Subjective:   Subjective: Chart Reviewed. Updates received. Patient Assessed. Created space and opportunity for patient  and family to explore thoughts and feelings regarding current medical situation.  Today's Discussion: Today before meeting with the patient/family, I reviewed the chart including cardiology note and internal medicine note from today, nursing flowsheets, medication administration record, vital signs.  Patient remained stable, discontinuing dobutamine  today to see if he tolerates.  Option for fluid bolus if he does not restart dobutamine  which would make decision making more difficult.  If he does tolerate weaning of dobutamine  possible discharge home.  I also reviewed labs including CMP from today which shows stable hyponatremia at 134 stable/slightly improved kidney function at 2.55, normal transaminases at AST/ALT of 28/22, alk phos normal at 59, mildly elevated bilirubin that is improved at 2.1 (peak at 5.8 four days ago).  CBC reassuring for no leukocytosis stable anemia with hemoglobin 8.2, platelets low but stable at 84 today.  Mag mildly elevated 2.5, phosphorus mildly low at 2.2.  Today saw the patient at the bedside, no family was present.  He states he has had a good day, breathing is doing good today.  He is not having any problems breathing, no pain, no nausea, no vomiting.  He is trying to eat, notes that his appetite is  coming back.  He notes that he has to eat to get better.  We spent time talking by CODE STATUS and he is again wanting to remain full code.  We also discussed options moving forward.  He he states that he does not want to come back to the hospital after discharge.  He states my body, mind, and my spirit will not let me go through this again.  This habit is taking his toll and I cannot keep doing it.  We discussed options moving forward given his desire to not return to the hospital including hospice.  I described hospice as a service for patients who have a life expectancy of 6 months or less. The goal of hospice is the preservation of dignity and quality at the end phases of life. Under hospice care, the focus changes from curative to symptom relief.  I told him that hospice would be a good option given that he does not want to return to the hospital.  I explained the three setting where hospice services can be provided including the home, at a living facility (such as LTC SNF, Assisted Living, etc), and a hospice facility. I explained that acceptance to hospice in any specific location is the final decision of the hospice medical director and bed availability, if applicable. They verbalized understanding.  The patient is agreeable to a hospice referral to discuss options to help him in decision making.  The patient did ask if I could call his wife [name] and try to discuss hospice with her and his desire to engage with them for questions.  I encouraged him to continue attempts  to speak with his family about his wishes.  I shared with the patient that I would be off tomorrow but will try to come back and see him on Monday if he remains in the hospital.  I encouraged him to call our team for any questions or concerns.  I provided emotional and general support through therapeutic listening, empathy, sharing of stories, and other techniques. I answered all questions and addressed all concerns to the best of  my ability.  I tried to call [name] later today and was unable to make contact.  We will continue attempts in the coming days.  I also reached out to hospice liaison from [employer] (choices offered to the patient and he elected [employer]).  She notes that she will be by tomorrow to visit with the patient and explore hospice services with him.  Review of Systems  Constitutional:        Denies pain in general  Respiratory:  Negative for shortness of breath.   Cardiovascular:  Negative for chest pain.  Gastrointestinal:  Negative for abdominal pain, nausea and vomiting.    Objective:   Primary Diagnoses: Present on Admission: . CAP (community acquired pneumonia) . Chronic combined systolic and diastolic heart failure (HCC) . Urinary tract infection . Lactic acidosis . Normal anion gap metabolic acidosis . Elevated troponin . Coronary artery disease . Acute kidney injury superimposed on chronic kidney disease (HCC) . Hyperkalemia . Pancytopenia (HCC) . Hypomagnesemia . Cocaine abuse (HCC)   Vital Signs:  BP 104/73   Pulse 79   Temp 97.7 F (36.5 C) (Oral)   Resp 11   Ht 6' 3 (1.905 m)   Wt 63.3 kg   SpO2 100%   BMI 17.44 kg/m   Physical Exam Vitals and nursing note reviewed.  Constitutional:      General: He is not in acute distress.    Appearance: He is ill-appearing. He is not toxic-appearing.  HENT:     Head: Normocephalic and atraumatic.  Cardiovascular:     Rate and Rhythm: Normal rate.  Pulmonary:     Effort: Pulmonary effort is normal. No respiratory distress.  Abdominal:     General: Abdomen is flat. There is no distension.  Skin:    General: Skin is warm and dry.  Neurological:     General: No focal deficit present.     Mental Status: He is alert and oriented to person, place, and time.  Psychiatric:        Mood and Affect: Mood normal.        Behavior: Behavior normal.     Palliative Assessment/Data: 60%   Existing  Vynca/ACP Documentation: None  Advanced Care Planning:   Existing Vynca/ACP Documentation: None  Primary Decision Maker: PATIENT  Pertinent diagnosis: Acute on chronic combined heart failure, AKI on CKD stage IV, AGMA  The patient and/or family consented to a voluntary Advance Care Planning Conversation in person. Individuals present for the conversation:  Summary of the conversation: Discussed CODE STATUS, discussed options moving forward including hospice  Outcome of the conversations and/or documents completed: The patient states she does not want to come back to the hospital after discharge.  We discussed hospice is an appropriate option.  He is agreeable to hospice coming to give him more information to help him in decision making.  He wants this discussed with his family as well.  We discussed CODE STATUS and he wishes to remain a full code at this time.  I spent  16 minutes providing separately identifiable ACP services with the patient and/or surrogate decision maker in a voluntary, in-person conversation discussing the patient's wishes and goals as detailed in the above note.  Assessment & Plan:   HPI/Patient Profile:  73 y.o. male  with past medical history of hypertension, hyperlipidemia, combined systolic and diastolic CHF, NSTEMI, CAD s/p PCI, COPD, CKD stage IV, and cocaine abuse admitted on 02/10/2024 with shortness of breath. He was found to be fluid overload in the ER, admitted to the hospital and placed on diuretics. 2D echocardiogram done 02/11/2024 shows LVEF 20-25%, global hypokinesis, RV systolic function was also severely reduced. Patient was started on dobutamine . Patient not a good candidate for home inotropes or advanced therapies per HF team. PMT consulted to discuss GOC.   SUMMARY OF RECOMMENDATIONS   Full code Full scope of care TOC consult for referral to [facility] discuss home hospice services (patient not committed yet, but wants more  information) Palliative medicine will follow-up on Monday Please notify us  of any significant clinical change or new palliative needs before then  Symptom Management:  Per primary team PMD is available to assist as needed  Code Status: Full code  Prognosis: < 6 months  Discharge Planning: To Be Determined  Discussed with: Patient, medical team, nursing team  Thank you for allowing us  to participate in the care of Gabrian Delaine Jaros PMT will continue to support holistically.  Billing based on MDM: Moderate  Detailed review of medical records (labs, imaging, vital signs), medically appropriate exam, discussed with treatment team, counseling and education to patient, family, & staff, documenting clinical information, medication management, coordination of care  Camellia Kays, NP Palliative Medicine Team  Team Phone # [phone] (Nights/Weekends)  03/27/2021, 8:17 AM

## 2024-02-15 DIAGNOSIS — Z7189 Other specified counseling: Secondary | ICD-10-CM

## 2024-02-15 DIAGNOSIS — R570 Cardiogenic shock: Secondary | ICD-10-CM | POA: Diagnosis not present

## 2024-02-15 DIAGNOSIS — J189 Pneumonia, unspecified organism: Secondary | ICD-10-CM | POA: Diagnosis not present

## 2024-02-15 LAB — COMPREHENSIVE METABOLIC PANEL WITH GFR
ALT: 19 U/L (ref 0–44)
AST: 31 U/L (ref 15–41)
Albumin: 2 g/dL — ABNORMAL LOW (ref 3.5–5.0)
Alkaline Phosphatase: 52 U/L (ref 38–126)
Anion gap: 7 (ref 5–15)
BUN: 40 mg/dL — ABNORMAL HIGH (ref 8–23)
CO2: 27 mmol/L (ref 22–32)
Calcium: 7.9 mg/dL — ABNORMAL LOW (ref 8.9–10.3)
Chloride: 97 mmol/L — ABNORMAL LOW (ref 98–111)
Creatinine, Ser: 2.49 mg/dL — ABNORMAL HIGH (ref 0.61–1.24)
GFR, Estimated: 27 mL/min — ABNORMAL LOW (ref >60)
Glucose, Bld: 114 mg/dL — ABNORMAL HIGH (ref 70–99)
Potassium: 4.3 mmol/L (ref 3.5–5.1)
Sodium: 131 mmol/L — ABNORMAL LOW (ref 135–145)
Total Bilirubin: 2.3 mg/dL — ABNORMAL HIGH (ref 0.0–1.2)
Total Protein: 5.2 g/dL — ABNORMAL LOW (ref 6.5–8.1)

## 2024-02-15 LAB — CULTURE, BLOOD (ROUTINE X 2)
Culture: NO GROWTH
Culture: NO GROWTH

## 2024-02-15 LAB — MAGNESIUM: Magnesium: 1.8 mg/dL (ref 1.7–2.4)

## 2024-02-15 LAB — CBC
HCT: 24.6 % — ABNORMAL LOW (ref 39.0–52.0)
Hemoglobin: 7.7 g/dL — ABNORMAL LOW (ref 13.0–17.0)
MCH: 23.4 pg — ABNORMAL LOW (ref 26.0–34.0)
MCHC: 31.3 g/dL (ref 30.0–36.0)
MCV: 74.8 fL — ABNORMAL LOW (ref 80.0–100.0)
Platelets: 84 K/uL — ABNORMAL LOW (ref 150–400)
RBC: 3.29 MIL/uL — ABNORMAL LOW (ref 4.22–5.81)
RDW: 22.6 % — ABNORMAL HIGH (ref 11.5–15.5)
WBC: 3.6 K/uL — ABNORMAL LOW (ref 4.0–10.5)
nRBC: 0 % (ref 0.0–0.2)

## 2024-02-15 LAB — PHOSPHORUS: Phosphorus: 2.3 mg/dL — ABNORMAL LOW (ref 2.5–4.6)

## 2024-02-15 NOTE — Progress Notes (Addendum)
 PROGRESS NOTE  Joseph Tanner FMW:[id number] DOB: 29-Nov-1950 DOA: 02/10/2024 PCP: Pcp, No   LOS: 5 days   Brief Narrative / Interim history: 73 y.o. male with medical history significant of hypertension, hyperlipidemia, combined systolic and diastolic CHF, NSTEMI, CAD s/p PCI, COPD, CKD stage IV, and cocaine abuse presents with shortness of breath.  Patient reports similar episode last November, he was hospitalized, improved and sent home.  Has not had any follow-up since.  Current symptoms started for the past week or so.  He was found to be fluid overload in the ER, admitted to the hospital and placed on diuretics.  Cardiology was consulted.  He reports he quit tobacco and alcohol for several months, last time he used cocaine was about 4 weeks ago  Subjective / 24h Interval events: He is not feeling well today, feels very weak and also complains of indigestion.  He tells me that the breakfast has not set well with him.  He denies any chest pain, denies any shortness of breath.  Does not feel like he has any swelling  Assesement and Plan: Principal problem Acute on chronic combined CHF -patient admitted to the hospital with significant fluid overload.  Most recent 2D echo last year showed EF 15% with grade 3 diastolic dysfunction.  2D echocardiogram done 02/11/2024 shows LVEF 20-25%, global hypokinesis, RV systolic function was also severely reduced - Cardiology consulted, appreciate input, patient was started on dobutamine  and transferred to progressive.  Dobutamine  has been discontinued yesterday, blood pressures have been really soft since.  Defer to cardiology further inotropes, also goals of care discussions are ongoing  Active problems Acute kidney injury on chronic kidney disease stage IV -baseline creatinine 2.2-2.4.  Up to 3.2 on 7/16, continues to improve and has overall stabilized.  I am concerned about it moving forward given hypotension  Possible pneumonia, ruled out -based  on the CT scan, but not sure that he truly has pneumonia with evidence of fluid overload, no fever, low procalcitonin and no white count elevation.  Cultures have remained negative, he is afebrile without any respiratory symptoms, antibiotics were discontinued 7/17  ?  Urinary retention -start Flomax   Elevated lactic acid, anion gap metabolic acidosis-with concern of poor perfusion, renal failure.  He was briefly on sodium bicarb, now discontinued.  Acidosis resolved  Diarrhea-resolved  UTI-asymptomatic, this is likely bacteriuria  CAD, elevated troponin -likely demand ischemia, not in a pattern consistent with ACS.  No chest pain.  Left heart cath March 2022 with severe stenosis small Lcx and mild moderate stenosis RCA that was to be treated medically.   Hyperkalemia-due to renal failure.  Potassium normal  Hypophosphatemia -stable, borderline low  Hypomagnesemia-magnesium  normal  Cocaine use -none for the past 4 weeks per patient, however UDS still positive  Thrombocytopenia, anemia -fairly chronic, no bleeding, monitor  Addendum 1 pm: Cardiology evaluated the patient this afternoon, he is hypotensive and not feeling good, and cardiology does not feel like he is a candidate for further interventions from their standpoint, do not start inotropes.  Discussed with the patient at bedside, he understands that his heart is very weak and does not much that we can do for him at this point.  Readdressed CODE STATUS, and he understands that should his heart stop doing chest compressions has no point, and he is agreeable to DO NOT RESUSCITATE.  We also discussed that if he is to have increasing pain, that we will be able to treat him and make sure that  he is comfortable.  He is agreeable to the plan. Will plan for home with hospice. DIscussed with patient's daughter and wife and they are aware.  Scheduled Meds:  atorvastatin   40 mg Oral QPM   Chlorhexidine  Gluconate Cloth  6 each Topical Daily    feeding supplement  237 mL Oral TID BM   folic acid   1 mg Oral Daily   guaiFENesin   600 mg Oral BID   mexiletine  200 mg Oral Q12H   multivitamin with minerals  1 tablet Oral Daily   sodium chloride  flush  10-40 mL Intracatheter Q12H   sodium chloride  flush  3 mL Intravenous Q12H   tamsulosin   0.4 mg Oral QPC breakfast   thiamine   100 mg Oral Daily   Continuous Infusions:   PRN Meds:.acetaminophen  **OR** acetaminophen , albuterol , melatonin, ondansetron  **OR** ondansetron  (ZOFRAN ) IV, sodium chloride  flush  Current Outpatient Medications  Medication Instructions   albuterol  (VENTOLIN  HFA) 108 (90 Base) MCG/ACT inhaler 2 puffs, Every 6 hours PRN   atorvastatin  (LIPITOR) 40 mg, Oral, Every evening   carvedilol  (COREG ) 3.125 mg, Oral, Daily   ibuprofen (ADVIL) 1,600 mg, Oral, As needed   isosorbide-hydrALAZINE  (BIDIL) 20-37.5 MG tablet 0.5 tablets, Oral, 3 times daily   Multiple Vitamin (MULTIVITAMIN WITH MINERALS) TABS tablet 1 tablet, Oral, Daily   sacubitril -valsartan  (ENTRESTO ) 24-26 MG 1 tablet, Oral, Every morning   spironolactone  (ALDACTONE ) 25 mg, Oral, Daily    Diet Orders (From admission, onward)     Start     Ordered   02/12/24 1502  Diet 2 gram sodium Room service appropriate? Yes with Assist; Fluid consistency: Thin  Diet effective now       Question Answer Comment  Room service appropriate? Yes with Assist   Fluid consistency: Thin      02/12/24 1502            DVT prophylaxis: SCDs Start: 02/10/24 1140   Lab Results  Component Value Date   PLT 84 (L) 02/15/2024      Code Status: Full Code  Family Communication: No family at bedside  Status is: Inpatient Remains inpatient appropriate because: Severity of illness   Level of care: Progressive  Consultants:  Cardiology  Objective: Vitals:   02/15/24 0530 02/15/24 0748 02/15/24 0749 02/15/24 0849  BP: 97/61 (!) 86/57  (!) 95/56  Pulse: 86 81    Resp: 20 (!) 22 19 19   Temp: 98.1 F (36.7 C)  97.9 F (36.6 C)    TempSrc: Oral Oral    SpO2: 99% 98%    Weight: 63.1 kg     Height:        Intake/Output Summary (Last 24 hours) at 02/15/2024 9061 Last data filed at 02/15/2024 0700 Gross per 24 hour  Intake 480 ml  Output 2600 ml  Net -2120 ml   Wt Readings from Last 3 Encounters:  02/15/24 63.1 kg  06/24/23 67 kg  06/14/23 64.6 kg    Examination:  Constitutional: NAD Eyes: lids and conjunctivae normal, no scleral icterus ENMT: mmm Neck: normal, supple Respiratory: clear to auscultation bilaterally, no wheezing, no crackles. Normal respiratory effort.  Cardiovascular: Regular rate and rhythm, no murmurs / rubs / gallops. Trace LE edema. Abdomen: soft, no distention, no tenderness. Bowel sounds positive.   Data Reviewed: I have independently reviewed following labs and imaging studies   CBC Recent Labs  Lab 02/10/24 0900 02/11/24 0326 02/12/24 0240 02/13/24 0448 02/14/24 0624 02/15/24 0500  WBC 4.3 5.3 5.6 4.1 3.9* 3.6*  HGB 8.8* 8.2* 8.2* 8.4* 8.2* 7.7*  HCT 29.5* 27.3* 25.6* 26.2* 25.7* 24.6*  PLT 96* 86* 90* 91* 84* 84*  MCV 78.0* 77.6* 74.4* 73.2* 73.9* 74.8*  MCH 23.3* 23.3* 23.8* 23.5* 23.6* 23.4*  MCHC 29.8* 30.0 32.0 32.1 31.9 31.3  RDW 23.3* 23.2* 23.0* 22.5* 22.6* 22.6*  LYMPHSABS 0.3*  --   --   --   --   --   MONOABS 0.2  --   --   --   --   --   EOSABS 0.0  --   --   --   --   --   BASOSABS 0.0  --   --   --   --   --     Recent Labs  Lab 02/10/24 0900 02/10/24 1103 02/10/24 1339 02/10/24 2028 02/10/24 2142 02/11/24 0326 02/11/24 1508 02/12/24 0240 02/12/24 1030 02/12/24 1440 02/13/24 0448 02/14/24 0624 02/15/24 0500  NA 136  --   --   --   --  139  --  135  --   --  134* 134* 131*  K 5.6*  --   --   --   --  5.9* 4.4 3.9  --   --  3.5 4.4 4.3  CL 108  --   --   --   --  109  --  105  --   --  101 97* 97*  CO2 15*  --   --   --   --  11*  --  18*  --   --  23 25 27   GLUCOSE 115*  --   --   --   --  103*  --  129*  --   --  136*  120* 114*  BUN 36*  --   --   --   --  39*  --  40*  --   --  36* 38* 40*  CREATININE 2.95*  --   --   --   --  3.28*  --  2.92*  --   --  2.69* 2.55* 2.49*  CALCIUM  9.0  --   --   --   --  9.2  --  8.4*  --   --  8.3* 8.1* 7.9*  AST 48*  --   --   --   --   --   --  48*  --   --  36 28 31  ALT 33  --   --   --   --   --   --  27  --   --  25 22 19   ALKPHOS 57  --   --   --   --   --   --  51  --   --  51 59 52  BILITOT 5.8*  --   --   --   --   --   --  3.3*  --   --  2.4* 2.1* 2.3*  ALBUMIN 2.8*  --   --   --   --   --   --  2.2*  --   --  2.2* 2.1* 2.0*  MG 1.6*  --   --   --   --   --   --  1.6*  --   --  1.7 2.5* 1.8  PROCALCITON  --   --   --  0.59  --   --   --   --   --   --   --   --   --  LATICACIDVEN  --    < > 5.1*  --  >9.0* 7.6*  --   --  3.0* 3.1*  --   --   --   BNP  --   --   --  >4,500.0*  --   --   --   --   --   --   --   --   --    < > = values in this interval not displayed.    ------------------------------------------------------------------------------------------------------------------ No results for input(s): CHOL, HDL, LDLCALC, TRIG, CHOLHDL, LDLDIRECT in the last 72 hours.  No results found for: HGBA1C ------------------------------------------------------------------------------------------------------------------ No results for input(s): TSH, T4TOTAL, T3FREE, THYROIDAB in the last 72 hours.  Invalid input(s): FREET3  Cardiac Enzymes No results for input(s): CKMB, TROPONINI, MYOGLOBIN in the last 168 hours.  Invalid input(s): CK ------------------------------------------------------------------------------------------------------------------    Component Value Date/Time   BNP >4,500.0 (H) 02/10/2024 2028    CBG: No results for input(s): GLUCAP in the last 168 hours.  Recent Results (from the past 240 hours)  Resp panel by RT-PCR (RSV, Flu A&B, Covid) Anterior Nasal Swab     Status: None   Collection Time:  02/10/24  8:51 AM   Specimen: Anterior Nasal Swab  Result Value Ref Range Status   SARS Coronavirus 2 by RT PCR NEGATIVE NEGATIVE Final   Influenza A by PCR NEGATIVE NEGATIVE Final   Influenza B by PCR NEGATIVE NEGATIVE Final    Comment: (NOTE) The Xpert Xpress SARS-CoV-2/FLU/RSV plus assay is intended as an aid in the diagnosis of influenza from Nasopharyngeal swab specimens and should not be used as a sole basis for treatment. Nasal washings and aspirates are unacceptable for Xpert Xpress SARS-CoV-2/FLU/RSV testing.  Fact Sheet for Patients: BloggerCourse.com  Fact Sheet for Healthcare Providers: SeriousBroker.it  This test is not yet approved or cleared by the [place]  FDA and has been authorized for detection and/or diagnosis of SARS-CoV-2 by FDA under an Emergency Use Authorization (EUA). This EUA will remain in effect (meaning this test can be used) for the duration of the COVID-19 declaration under Section 564(b)(1) of the Act, 21 U.S.C. section 360bbb-3(b)(1), unless the authorization is terminated or revoked.     Resp Syncytial Virus by PCR NEGATIVE NEGATIVE Final    Comment: (NOTE) Fact Sheet for Patients: BloggerCourse.com  Fact Sheet for Healthcare Providers: SeriousBroker.it  This test is not yet approved or cleared by the [place]  FDA and has been authorized for detection and/or diagnosis of SARS-CoV-2 by FDA under an Emergency Use Authorization (EUA). This EUA will remain in effect (meaning this test can be used) for the duration of the COVID-19 declaration under Section 564(b)(1) of the Act, 21 U.S.C. section 360bbb-3(b)(1), unless the authorization is terminated or revoked.  Performed at [facility], [address], [place], [place] [address]   Culture, blood (routine x 2)     Status: None   Collection Time: 02/10/24  9:00 AM   Specimen:  BLOOD RIGHT HAND  Result Value Ref Range Status   Specimen Description BLOOD RIGHT HAND  Final   Special Requests   Final    BOTTLES DRAWN AEROBIC AND ANAEROBIC Blood Culture results may not be optimal due to an inadequate volume of blood received in culture bottles   Culture   Final    NO GROWTH 5 DAYS Performed at [facility], [address] [place], [place] [address]    Report Status 02/15/2024 FINAL  Final  Urine Culture  Status: Abnormal   Collection Time: 02/10/24 11:17 AM   Specimen: Urine, Random  Result Value Ref Range Status   Specimen Description URINE, RANDOM  Final   Special Requests   Final    NONE Reflexed from [id number] Performed at [facility], [address], [place], [place] [address]    Culture >=100,000 COLONIES/mL ENTEROCOCCUS FAECALIS (A)  Final   Report Status 02/13/2024 FINAL  Final   Organism ID, Bacteria ENTEROCOCCUS FAECALIS (A)  Final      Susceptibility   Enterococcus faecalis - MIC*    AMPICILLIN <=2 SENSITIVE Sensitive     NITROFURANTOIN <=16 SENSITIVE Sensitive     VANCOMYCIN 1 SENSITIVE Sensitive     * >=100,000 COLONIES/mL ENTEROCOCCUS FAECALIS  Culture, blood (routine x 2)     Status: None   Collection Time: 02/10/24  8:28 PM   Specimen: BLOOD RIGHT HAND  Result Value Ref Range Status   Specimen Description BLOOD RIGHT HAND  Final   Special Requests   Final    BOTTLES DRAWN AEROBIC AND ANAEROBIC Blood Culture results may not be optimal due to an inadequate volume of blood received in culture bottles   Culture   Final    NO GROWTH 5 DAYS Performed at [facility], [address], [place], [place] [address]    Report Status 02/15/2024 FINAL  Final     Radiology Studies: No results found.  Nilda Fendt, MD, PhD [facility]  Between 7 am - 7 pm I am available, please contact me via Amion (for emergencies) or Securechat (non urgent messages)  Between 7 pm - 7 am I am not available, please contact night  coverage MD/APP via Amion

## 2024-02-15 NOTE — Progress Notes (Signed)
 Advanced Heart Failure Rounding Note  Cardiologist: Annabella Scarce, MD   Chief Complaint: Cardiogenic shock  Subjective:    Patient off dobutamine , lying in bed, clammy, eyes closed. Reports that his belly is starting to feel poorly again.   We discussed that he appears to be getting worse and suspect that he is near the end of life. We discussed that restarting dobutamine  would not likely extend his life significantly. He was open to discussing hospice care, whether here or in a facility.   Would not restart inotropes at this time, midodrine unlikely to improve his symptoms, only treat a BP number. If his abdominal pain were to worsen would treat with palliative medications. Will work on arranging hospice care tomorrow. Patient in agreement.    Objective:   Weight Range: 63.1 kg Body mass index is 17.39 kg/m.   Vital Signs:   Temp:  [97.3 F (36.3 C)-98.7 F (37.1 C)] 98.7 F (37.1 C) (07/20 1132) Pulse Rate:  [81-91] 87 (07/20 1132) Resp:  [11-25] 19 (07/20 1144) BP: (86-110)/(56-77) 100/60 (07/20 1132) SpO2:  [98 %-100 %] 100 % (07/20 1132) Weight:  [63.1 kg] 63.1 kg (07/20 0530) Last BM Date : 02/13/24  Weight change: Filed Weights   02/13/24 0511 02/14/24 0630 02/15/24 0530  Weight: 63 kg 63.3 kg 63.1 kg    Intake/Output:   Intake/Output Summary (Last 24 hours) at 02/15/2024 1236 Last data filed at 02/15/2024 0700 Gross per 24 hour  Intake 480 ml  Output 2600 ml  Net -2120 ml      Physical Exam    General:  Cachectic, chronically ill appearing Neck: JVP flat Cor: Regular rate & rhythm. No rubs, gallops or murmurs. Lungs: Clear Abdomen: Soft, nontender, nondistended.  Extremities: Trace edema Neuro: Alert & orientedx3. Affect flat.    Labs    CBC Recent Labs    02/14/24 0624 02/15/24 0500  WBC 3.9* 3.6*  HGB 8.2* 7.7*  HCT 25.7* 24.6*  MCV 73.9* 74.8*  PLT 84* 84*   Basic Metabolic Panel Recent Labs    92/80/74 0624  02/15/24 0500  NA 134* 131*  K 4.4 4.3  CL 97* 97*  CO2 25 27  GLUCOSE 120* 114*  BUN 38* 40*  CREATININE 2.55* 2.49*  CALCIUM  8.1* 7.9*  MG 2.5* 1.8  PHOS 2.2* 2.3*   Liver Function Tests Recent Labs    02/14/24 0624 02/15/24 0500  AST 28 31  ALT 22 19  ALKPHOS 59 52  BILITOT 2.1* 2.3*  PROT 5.2* 5.2*  ALBUMIN 2.1* 2.0*   No results for input(s): LIPASE, AMYLASE in the last 72 hours. Cardiac Enzymes No results for input(s): CKTOTAL, CKMB, CKMBINDEX, TROPONINI in the last 72 hours.  BNP: BNP (last 3 results) Recent Labs    06/14/23 2358 06/24/23 1511 02/10/24 2028  BNP 2,436.6* >4,500.0* >4,500.0*    ProBNP (last 3 results) No results for input(s): PROBNP in the last 8760 hours.   D-Dimer No results for input(s): DDIMER in the last 72 hours. Hemoglobin A1C No results for input(s): HGBA1C in the last 72 hours. Fasting Lipid Panel No results for input(s): CHOL, HDL, LDLCALC, TRIG, CHOLHDL, LDLDIRECT in the last 72 hours. Thyroid Function Tests No results for input(s): TSH, T4TOTAL, T3FREE, THYROIDAB in the last 72 hours.  Invalid input(s): FREET3  Other results:    Medications:     Scheduled Medications:  atorvastatin   40 mg Oral QPM   Chlorhexidine  Gluconate Cloth  6 each Topical Daily  feeding supplement  237 mL Oral TID BM   folic acid   1 mg Oral Daily   guaiFENesin   600 mg Oral BID   mexiletine  200 mg Oral Q12H   multivitamin with minerals  1 tablet Oral Daily   sodium chloride  flush  10-40 mL Intracatheter Q12H   sodium chloride  flush  3 mL Intravenous Q12H   tamsulosin   0.4 mg Oral QPC breakfast   thiamine   100 mg Oral Daily    Infusions:    PRN Medications: acetaminophen  **OR** acetaminophen , albuterol , melatonin, ondansetron  **OR** ondansetron  (ZOFRAN ) IV, sodium chloride  flush    Patient Profile  73 y.o. male with history of chronic systolic heart failure, CAD, COPD, CKD IV, HTN, HLD,  hx cocaine abuse, tobacco use, recurrent UTIs and poor compliance, admitted w/ a/c systolic heart failure w/ low-output.   Assessment/Plan   1. Acute on Chronic Systolic Heart Failure w/ Cardiogenic Shock  - Diagnosed with HF in 2019 while living in [place]  - NICM. Has CAD but severity of CM out of proportion to degree of CAD - cocaine and ETOH use likely contributing   - EF has been in 25-30% range, most recently < 20% on echo 04/24. Offered ICD but has declined - now admitted w/ NYHA IV symptoms and volume overload w/ CS, LA > 9.0. Echo w/ Bi-V failure, EF 20-25%, RV severely reduced  - Initially placed on DBA for diuresis and while patient was in extremis, now weaned off - Tolerating poorly, appears to be slowly decompensating - Recommend continued goals of care conversation, will discuss DNR/DNI and hospice care - Would not restart inotropes - No midodrine, suspect may make abdominal pain worse - MAP goal >60 but if actively worsening would recommend transition to comfort care - Discussed importance of adherence with medical therapy - Suspect end-stage HF, cardiac cachexia and worsening renal failure.  He is not a candidate for advanced therapies given substance use and poor compliance.  Not a candidate for home inotropes given substance use.  Palliative care consult for GOC discussions.  2. PVCs - Improving - Continue mexiletine 200 BID - Supp K and Mag   3. AKI on CKD IV  - b/l SCr 2.5. back to baseline now - cardiorenal from low output  - support CO  - no SLGT2i given h/o frequent UTIs    4. Hyperkalemia - In setting of ARF   5. Polysubstance Use - UDS + for cocaine  - needs to quit    6. ID. ? CAP + UTI  - No leukocytosis and Afebrile. Procal 0.59 - Treated initially with empiric abx, now stopped d/t low suspicion for active infection.  Hospice consult tomorrow, if worsening would recommend DNR/DNI and transition to comfort care  Length of Stay: 5  Morene JINNY Brownie, MD  02/15/2024, 12:36 PM  Advanced Heart Failure Team Pager [phone] (M-F; 7a - 5p)  Please contact [facility] for night-coverage after hours (5p -7a ) and weekends on [email or web address]

## 2024-02-15 NOTE — Progress Notes (Signed)
 Jolynn Pack [phone] [facility] Liaison Note:   Referral received for possible Home with Hospice Services. Saw patient at bedside to discuss hospice services. He requested to return back on tomorrow to discuss because he was not having a good day. Will follow up on tomorrow.     Thank you for the opportunity to participate in this patient's plan of care.    Nat Babe, BSN, [employer] [phone]

## 2024-02-16 DIAGNOSIS — R0600 Dyspnea, unspecified: Secondary | ICD-10-CM | POA: Diagnosis not present

## 2024-02-16 DIAGNOSIS — J189 Pneumonia, unspecified organism: Secondary | ICD-10-CM | POA: Diagnosis not present

## 2024-02-16 DIAGNOSIS — Z7189 Other specified counseling: Secondary | ICD-10-CM | POA: Diagnosis not present

## 2024-02-16 DIAGNOSIS — Z66 Do not resuscitate: Secondary | ICD-10-CM

## 2024-02-16 DIAGNOSIS — Z515 Encounter for palliative care: Secondary | ICD-10-CM | POA: Diagnosis not present

## 2024-02-16 DIAGNOSIS — R570 Cardiogenic shock: Secondary | ICD-10-CM | POA: Diagnosis not present

## 2024-02-16 DIAGNOSIS — Z789 Other specified health status: Secondary | ICD-10-CM

## 2024-02-16 LAB — CBC
HCT: 24.2 % — ABNORMAL LOW (ref 39.0–52.0)
Hemoglobin: 7.4 g/dL — ABNORMAL LOW (ref 13.0–17.0)
MCH: 23.2 pg — ABNORMAL LOW (ref 26.0–34.0)
MCHC: 30.6 g/dL (ref 30.0–36.0)
MCV: 75.9 fL — ABNORMAL LOW (ref 80.0–100.0)
Platelets: 87 K/uL — ABNORMAL LOW (ref 150–400)
RBC: 3.19 MIL/uL — ABNORMAL LOW (ref 4.22–5.81)
RDW: 22.5 % — ABNORMAL HIGH (ref 11.5–15.5)
WBC: 4.9 K/uL (ref 4.0–10.5)
nRBC: 0 % (ref 0.0–0.2)

## 2024-02-16 LAB — COMPREHENSIVE METABOLIC PANEL WITH GFR
ALT: 20 U/L (ref 0–44)
AST: 32 U/L (ref 15–41)
Albumin: 2.1 g/dL — ABNORMAL LOW (ref 3.5–5.0)
Alkaline Phosphatase: 52 U/L (ref 38–126)
Anion gap: 9 (ref 5–15)
BUN: 41 mg/dL — ABNORMAL HIGH (ref 8–23)
CO2: 24 mmol/L (ref 22–32)
Calcium: 7.9 mg/dL — ABNORMAL LOW (ref 8.9–10.3)
Chloride: 99 mmol/L (ref 98–111)
Creatinine, Ser: 2.35 mg/dL — ABNORMAL HIGH (ref 0.61–1.24)
GFR, Estimated: 29 mL/min — ABNORMAL LOW (ref >60)
Glucose, Bld: 148 mg/dL — ABNORMAL HIGH (ref 70–99)
Potassium: 3.6 mmol/L (ref 3.5–5.1)
Sodium: 132 mmol/L — ABNORMAL LOW (ref 135–145)
Total Bilirubin: 2 mg/dL — ABNORMAL HIGH (ref 0.0–1.2)
Total Protein: 5.5 g/dL — ABNORMAL LOW (ref 6.5–8.1)

## 2024-02-16 LAB — MAGNESIUM: Magnesium: 1.7 mg/dL (ref 1.7–2.4)

## 2024-02-16 LAB — PREPARE RBC (CROSSMATCH)

## 2024-02-16 LAB — PHOSPHORUS: Phosphorus: 2.2 mg/dL — ABNORMAL LOW (ref 2.5–4.6)

## 2024-02-16 MED ORDER — POTASSIUM PHOSPHATES 15 MMOLE/5ML IV SOLN
15.0000 mmol | Freq: Once | INTRAVENOUS | Status: DC
  Administered 2024-02-16: 15 mmol via INTRAVENOUS
  Filled 2024-02-16: qty 5

## 2024-02-16 MED ORDER — K PHOS MONO-SOD PHOS DI & MONO 155-852-130 MG PO TABS
500.0000 mg | ORAL_TABLET | Freq: Two times a day (BID) | ORAL | Status: AC
  Administered 2024-02-16 (×2): 500 mg via ORAL
  Filled 2024-02-16 (×2): qty 2

## 2024-02-16 MED ORDER — SODIUM CHLORIDE 0.9% IV SOLUTION: Freq: Once | INTRAVENOUS | Status: AC

## 2024-02-16 NOTE — TOC Progression Note (Addendum)
 Transition of Care [facility]) - Progression Note    Patient Details  Name: Joseph Tanner MRN: [id number] Date of Birth: [birth year]  Transition [facility]) CM/SW Contact  Justina Delcia Czar, RN Phone Number: [phone] 02/16/2024, 3:59 PM  Clinical Narrative:     [name] rep, agency is following for Home Hospice. Agency will arrange DME for home. Message for attending, possible dc home tomorrow when Hospice has been arranged.   Expected Discharge Plan: Home/Self Care Barriers to Discharge: Continued Medical Work up  Expected Discharge Plan and Services   Discharge Planning Services: CM Consult   Living arrangements for the past 2 months: Apartment   Social Determinants of Health (SDOH) Interventions SDOH Screenings   Food Insecurity: No Food Insecurity (02/10/2024)  Housing: Low Risk  (02/10/2024)  Transportation Needs: No Transportation Needs (02/10/2024)  Utilities: Not At Risk (02/10/2024)  Alcohol Screen: Low Risk  (06/17/2023)  Financial Resource Strain: Low Risk  (06/17/2023)  Social Connections: Socially Isolated (02/10/2024)  Tobacco Use: High Risk (02/13/2024)    Readmission Risk Interventions    02/11/2024   11:47 AM  Readmission Risk Prevention Plan  Transportation Screening Complete  PCP or Specialist Appt within 5-7 Days Complete  Home Care Screening Complete  Medication Review (RN CM) Complete

## 2024-02-16 NOTE — Progress Notes (Signed)
 Physical Therapy Treatment Patient Details Name: Joseph Tanner MRN: [id number] DOB: [birth year] Today's Date: 02/16/2024   History of Present Illness 73 y.o. male [facility]/10/24 with SOB. Chest x-ray concerning for aspiration or PNA, +UTI, + cocaine. PMHx: hypertension, hyperlipidemia, combined systolic and diastolic CHF, NSTEMI, CAD s/p PCI, COPD, CKD stage IV, and cocaine abuse    PT Comments  Patient needs encouragement to participate. He was able to stand without physical assistance but declined to walk. No dizziness reported with mobility but patient is fatigued with activity. PT will continue to follow.    If plan is discharge home, recommend the following: A little help with walking and/or transfers;Assist for transportation;Help with stairs or ramp for entrance;A little help with bathing/dressing/bathroom;Assistance with cooking/housework   Can travel by private vehicle        Equipment Recommendations  None recommended by PT    Recommendations for Other Services       Precautions / Restrictions Precautions Precautions: Fall Recall of Precautions/Restrictions: Intact Restrictions Weight Bearing Restrictions Per Provider Order: No     Mobility  Bed Mobility Overal bed mobility: Needs Assistance Bed Mobility: Supine to Sit, Sit to Supine     Supine to sit: Supervision, HOB elevated Sit to supine: Supervision, HOB elevated   General bed mobility comments: increased time. relying heavily on bed rails to sit upright    Transfers Overall transfer level: Needs assistance Equipment used: None Transfers: Sit to/from Stand Sit to Stand: Supervision           General transfer comment: no physical assistance required for standing    Ambulation/Gait               General Gait Details: patient declined to walk today   Stairs             Wheelchair Mobility     Tilt Bed    Modified Rankin (Stroke Patients Only)        Balance Overall balance assessment: Needs assistance Sitting-balance support: Feet supported Sitting balance-Leahy Scale: Good     Standing balance support: No upper extremity supported Standing balance-Leahy Scale: Fair Standing balance comment: no loss of balance without UE support                            Communication Communication Communication: No apparent difficulties  Cognition Arousal: Alert Behavior During Therapy: Flat affect   PT - Cognitive impairments: No apparent impairments                         Following commands: Intact      Cueing Cueing Techniques: Verbal cues  Exercises      General Comments General comments (skin integrity, edema, etc.): no shortness of breath noted with activity. encouraged routine repositioning for skin integrity.      Pertinent Vitals/Pain Pain Assessment Pain Assessment: Faces Faces Pain Scale: Hurts little more Pain Location: buttocks Pain Descriptors / Indicators: Discomfort Pain Intervention(s): Repositioned, Limited activity within patient's tolerance    Home Living                          Prior Function            PT Goals (current goals can now be found in the care plan section) Acute Rehab PT Goals Patient Stated Goal: to get stronger PT Goal Formulation: With patient Time  For Goal Achievement: 02/25/24 Potential to Achieve Goals: Good Progress towards PT goals: Progressing toward goals    Frequency    Min 2X/week      PT Plan      Co-evaluation              AM-PAC PT 6 Clicks Mobility   Outcome Measure  Help needed turning from your back to your side while in a flat bed without using bedrails?: A Little Help needed moving from lying on your back to sitting on the side of a flat bed without using bedrails?: A Little Help needed moving to and from a bed to a chair (including a wheelchair)?: A Little Help needed standing up from a chair using your arms  (e.g., wheelchair or bedside chair)?: A Little Help needed to walk in hospital room?: A Little Help needed climbing 3-5 steps with a railing? : A Lot 6 Click Score: 17    End of Session   Activity Tolerance: Patient limited by fatigue Patient left: in bed;with call bell/phone within reach;with bed alarm set   PT Visit Diagnosis: Unsteadiness on feet (R26.81);Other abnormalities of gait and mobility (R26.89);Muscle weakness (generalized) (M62.81)     Time: 8763-8749 PT Time Calculation (min) (ACUTE ONLY): 14 min  Charges:    $Therapeutic Activity: 8-22 mins PT General Charges $$ ACUTE PT VISIT: 1 Visit                     [name], PT, MPT   [name] 02/16/2024, 12:55 PM

## 2024-02-16 NOTE — Progress Notes (Signed)
 Daily Progress Note   Date: 02/16/2024   Patient Name: Joseph Tanner  DOB: 11/08/1950  MRN: [id number]  Age / Sex: 73 y.o., male  Attending Physician: Trixie Nilda HERO, MD Primary Care Physician: Pcp, No Admit Date: 02/10/2024 Length of Stay: 6 days  Reason for Consultation: Establishing goals of care  Past Medical History:  Diagnosis Date   COPD (chronic obstructive pulmonary disease) (HCC)    Hyperlipemia    Hypertension    NSTEMI (non-ST elevated myocardial infarction) (HCC)    Pure hypercholesterolemia 03/15/2021    Subjective:   Subjective: Chart Reviewed. Updates received. Patient Assessed. Created space and opportunity for patient  and family to explore thoughts and feelings regarding current medical situation.  Today's Discussion: Today before meeting with the patient/family, I reviewed the chart including cardiology note from today, internal medicine note from today, PT note from today.  I also reviewed nursing flowsheets, medication administration record, vital signs.  Later today I reviewed TOC note surrounding discharge planning.  Patient remained stable, now off dobutamine  with no intent to restart.  Ongoing discussions around hospice care at discharge, was made DNR weekend.  I also reviewed labs including CMP from today which shows stable hyponatremia at 132 stable/slightly improved kidney function at 2.35, normal transaminases at AST/ALT of 32/20, alk phos normal at 52, mildly elevated bilirubin that is improved at 2.0 (peak at 5.8 four days ago).  CBC reassuring for no leukocytosis stable anemia with hemoglobin 7.4 (though part of a slow downtrend from 8.8 a week ago), platelets low but stable at 87 today.  Mag mildly elevated 2.5, phosphorus mildly low at 2.2.  Prior to seeing the patient I debriefed with the hospitalist who states that over the weekend cardiology told her something left to do for his heart and he will continue to decline.  The hospitalist at  that point engage in a CODE STATUS discussion and patient was agreeable to DNR at that time.  Today saw the patient at the bedside, no family was present.  He states today has been up and down, is pretty tired.  At told him that he does appear tired as well.  We discussed options moving forward considering that his heart is very sick and we are unable to fix it.  We discussed hospice care again and I confirmed that he would like to engage with hospice for home services at discharge.  He states that hospice is already been by left the packet, although he has not had time to read it.  I explained the philosophy of hospice for comfort, peace, dignity for however much time is left in life in a situation that is clinically unfixable.  Review of Systems  Constitutional:  Positive for fatigue.       Denies pain in general  Respiratory:  Negative for shortness of breath.   Cardiovascular:  Negative for chest pain.  Gastrointestinal:  Negative for abdominal pain, nausea and vomiting.    Objective:   Primary Diagnoses: Present on Admission:  CAP (community acquired pneumonia)  Chronic combined systolic and diastolic heart failure (HCC)  Urinary tract infection  Lactic acidosis  Normal anion gap metabolic acidosis  Elevated troponin  Coronary artery disease  Acute kidney injury superimposed on chronic kidney disease (HCC)  Hyperkalemia  Pancytopenia (HCC)  Hypomagnesemia  Cocaine abuse (HCC)   Vital Signs:  BP 113/64 (BP Location: Left Arm)   Pulse 86   Temp 98.1 F (36.7 C) (Oral)   Resp 16  Ht 6' 3 (1.905 m)   Wt 62.6 kg   SpO2 100%   BMI 17.25 kg/m   Physical Exam Vitals and nursing note reviewed.  Constitutional:      General: He is sleeping. He is not in acute distress.    Appearance: He is ill-appearing. He is not toxic-appearing.  HENT:     Head: Normocephalic and atraumatic.  Cardiovascular:     Rate and Rhythm: Normal rate.  Pulmonary:     Effort: Pulmonary  effort is normal. No respiratory distress.  Abdominal:     General: Abdomen is flat. There is no distension.  Skin:    General: Skin is warm and dry.  Neurological:     General: No focal deficit present.     Mental Status: He is oriented to person, place, and time and easily aroused.  Psychiatric:        Mood and Affect: Mood normal.        Behavior: Behavior normal.     Palliative Assessment/Data: 50%   Existing Vynca/ACP Documentation: None  Assessment & Plan:   HPI/Patient Profile:  73 y.o. male  with past medical history of hypertension, hyperlipidemia, combined systolic and diastolic CHF, NSTEMI, CAD s/p PCI, COPD, CKD stage IV, and cocaine abuse admitted on 02/10/2024 with shortness of breath. He was found to be fluid overload in the ER, admitted to the hospital and placed on diuretics. 2D echocardiogram done 02/11/2024 shows LVEF 20-25%, global hypokinesis, RV systolic function was also severely reduced. Patient was started on dobutamine . Patient not a good candidate for home inotropes or advanced therapies per HF team. PMT consulted to discuss GOC.   SUMMARY OF RECOMMENDATIONS   DNR Continue current scope of care, no escalation Anticipate discharge home with hospice services in next 24 to 48 hours  Palliative medicine will back off for now as goals are clear Please notify us  of any significant clinical change or new palliative needs  Symptom Management:  Per primary team PMD is available to assist as needed  Code Status: DNR-comfort  Prognosis: < 6 months  Discharge Planning: Home with Hospice  Discussed with: Patient, medical team, nursing team  Thank you for allowing us  to participate in the care of Joseph Tanner PMT will continue to support holistically.  Billing based on MDM: High  Problems Addressed: One acute or chronic illness or injury that poses a threat to life or bodily function  Risks: Decision not to resuscitate or to de-escalate care  because of poor prognosis (Decision for no escalation of care and discharge home with hospice services)  Detailed review of medical records (labs, imaging, vital signs), medically appropriate exam, discussed with treatment team, counseling and education to patient, family, & staff, documenting clinical information, medication management, coordination of care  Camellia Kays, NP Palliative Medicine Team  Team Phone # [phone] (Nights/Weekends)  03/27/2021, 8:17 AM

## 2024-02-16 NOTE — Plan of Care (Signed)
  Problem: Clinical Measurements: Goal: Ability to maintain clinical measurements within normal limits will improve Outcome: Progressing Received 1 unit RBC's today  Problem: Activity: Goal: Risk for activity intolerance will decrease Outcome: Progressing  Refused ambulation this evening Problem: Nutrition: Goal: Adequate nutrition will be maintained Outcome: Progressing Poor appetite

## 2024-02-16 NOTE — Progress Notes (Signed)
 PROGRESS NOTE  Joseph Tanner FMW:[id number] DOB: 1951/03/17 DOA: 02/10/2024 PCP: Pcp, No   LOS: 6 days   Brief Narrative / Interim history: 73 y.o. male with medical history significant of hypertension, hyperlipidemia, combined systolic and diastolic CHF, NSTEMI, CAD s/p PCI, COPD, CKD stage IV, and cocaine abuse presents with shortness of breath.  Patient reports similar episode last November, he was hospitalized, improved and sent home.  Has not had any follow-up since.  Current symptoms started for the past week or so.  He was found to be fluid overload in the ER, admitted to the hospital and placed on diuretics.  Cardiology was consulted.  He reports he quit tobacco and alcohol for several months, last time he used cocaine was about 4 weeks ago  Subjective / 24h Interval events: He is feeling better this morning.  Denies any chest pain, denies any palpitations.  Overall feels very weak  Assesement and Plan: Principal problem Acute on chronic combined CHF -patient admitted to the hospital with significant fluid overload.  Most recent 2D echo last year showed EF 15% with grade 3 diastolic dysfunction.  2D echocardiogram done 02/11/2024 shows LVEF 20-25%, global hypokinesis, RV systolic function was also severely reduced - Cardiology consulted, appreciate input, initially was on dobutamine  with improvement, however when this was weaned off he became hypotensive and had a rough day 7/20.  Palliative consulted for goals of care since he is end-stage CHF, and he is now DNR.  Will not reinitiate inotropes.  Active problems Acute kidney injury on chronic kidney disease stage IV -baseline creatinine 2.2-2.4.  Up to 3.2 on 7/16, continues to improve and has stabilized this morning  Possible pneumonia, ruled out -based on the CT scan, but not sure that he truly has pneumonia with evidence of fluid overload, no fever, low procalcitonin and no white count elevation.  Cultures have remained  negative, he is afebrile without any respiratory symptoms, antibiotics were discontinued 7/17  ?  Urinary retention -start Flomax   Elevated lactic acid, anion gap metabolic acidosis-with concern of poor perfusion, renal failure.  He was briefly on sodium bicarb, now discontinued.  Acidosis resolved  Diarrhea-resolved  UTI-asymptomatic, this is likely bacteriuria  CAD, elevated troponin -likely demand ischemia, not in a pattern consistent with ACS.  No chest pain.  Left heart cath March 2022 with severe stenosis small Lcx and mild moderate stenosis RCA that was to be treated medically.   Hyperkalemia-due to renal failure.  Potassium now normalized  Hypophosphatemia -replenish phosphorus again today  Hypomagnesemia-magnesium  normal  Cocaine use -none for the past 4 weeks per patient, however UDS still positive  Thrombocytopenia, anemia -fairly chronic, in the setting of renal disease, hemoglobin trending down and is in the low 7 range.  Due to his underlying heart disease, will prefer to keep above 8, will transfuse unit of packed red blood cells  Disposition-will work on home with hospice   Scheduled Meds:  sodium chloride    Intravenous Once   atorvastatin   40 mg Oral QPM   Chlorhexidine  Gluconate Cloth  6 each Topical Daily   feeding supplement  237 mL Oral TID BM   folic acid   1 mg Oral Daily   guaiFENesin   600 mg Oral BID   mexiletine  200 mg Oral Q12H   multivitamin with minerals  1 tablet Oral Daily   phosphorus  500 mg Oral BID   sodium chloride  flush  10-40 mL Intracatheter Q12H   sodium chloride  flush  3 mL Intravenous  Q12H   tamsulosin   0.4 mg Oral QPC breakfast   thiamine   100 mg Oral Daily   Continuous Infusions:   PRN Meds:.acetaminophen  **OR** acetaminophen , albuterol , melatonin, ondansetron  **OR** ondansetron  (ZOFRAN ) IV, sodium chloride  flush  Current Outpatient Medications  Medication Instructions   albuterol  (VENTOLIN  HFA) 108 (90 Base) MCG/ACT inhaler 2  puffs, Every 6 hours PRN   atorvastatin  (LIPITOR) 40 mg, Oral, Every evening   carvedilol  (COREG ) 3.125 mg, Oral, Daily   ibuprofen (ADVIL) 1,600 mg, Oral, As needed   isosorbide-hydrALAZINE  (BIDIL) 20-37.5 MG tablet 0.5 tablets, Oral, 3 times daily   Multiple Vitamin (MULTIVITAMIN WITH MINERALS) TABS tablet 1 tablet, Oral, Daily   sacubitril -valsartan  (ENTRESTO ) 24-26 MG 1 tablet, Oral, Every morning   spironolactone  (ALDACTONE ) 25 mg, Oral, Daily    Diet Orders (From admission, onward)     Start     Ordered   02/12/24 1502  Diet 2 gram sodium Room service appropriate? Yes with Assist; Fluid consistency: Thin  Diet effective now       Question Answer Comment  Room service appropriate? Yes with Assist   Fluid consistency: Thin      02/12/24 1502            DVT prophylaxis: SCDs Start: 02/10/24 1140   Lab Results  Component Value Date   PLT 87 (L) 02/16/2024      Code Status: Do not attempt resuscitation (DNR) - Comfort care  Family Communication: No family at bedside  Status is: Inpatient Remains inpatient appropriate because: Severity of illness   Level of care: Progressive  Consultants:  Cardiology  Objective: Vitals:   02/15/24 1556 02/15/24 2104 02/16/24 0225 02/16/24 0500  BP: 102/65 (!) 92/53 (!) 104/57   Pulse: 87  83   Resp: 18 17 19    Temp: 98.2 F (36.8 C) 98.3 F (36.8 C) 97.7 F (36.5 C)   TempSrc: Oral Oral Oral   SpO2: 100% 100% 100%   Weight:    62.6 kg  Height:        Intake/Output Summary (Last 24 hours) at 02/16/2024 0842 Last data filed at 02/16/2024 9166 Gross per 24 hour  Intake 510.45 ml  Output 600 ml  Net -89.55 ml   Wt Readings from Last 3 Encounters:  02/16/24 62.6 kg  06/24/23 67 kg  06/14/23 64.6 kg    Examination:  Constitutional: NAD Eyes: lids and conjunctivae normal, no scleral icterus ENMT: mmm Neck: normal, supple Respiratory: clear to auscultation bilaterally, no wheezing, no crackles. Normal  respiratory effort.  Cardiovascular: Regular rate and rhythm, no murmurs / rubs / gallops. Trace edema Abdomen: soft, no distention, no tenderness. Bowel sounds positive.   Data Reviewed: I have independently reviewed following labs and imaging studies   CBC Recent Labs  Lab 02/10/24 0900 02/11/24 0326 02/12/24 0240 02/13/24 0448 02/14/24 0624 02/15/24 0500 02/16/24 0215  WBC 4.3   < > 5.6 4.1 3.9* 3.6* 4.9  HGB 8.8*   < > 8.2* 8.4* 8.2* 7.7* 7.4*  HCT 29.5*   < > 25.6* 26.2* 25.7* 24.6* 24.2*  PLT 96*   < > 90* 91* 84* 84* 87*  MCV 78.0*   < > 74.4* 73.2* 73.9* 74.8* 75.9*  MCH 23.3*   < > 23.8* 23.5* 23.6* 23.4* 23.2*  MCHC 29.8*   < > 32.0 32.1 31.9 31.3 30.6  RDW 23.3*   < > 23.0* 22.5* 22.6* 22.6* 22.5*  LYMPHSABS 0.3*  --   --   --   --   --   --  MONOABS 0.2  --   --   --   --   --   --   EOSABS 0.0  --   --   --   --   --   --   BASOSABS 0.0  --   --   --   --   --   --    < > = values in this interval not displayed.    Recent Labs  Lab 02/10/24 1339 02/10/24 2028 02/10/24 2142 02/11/24 0326 02/11/24 1508 02/12/24 0240 02/12/24 1030 02/12/24 1440 02/13/24 0448 02/14/24 0624 02/15/24 0500 02/16/24 0215  NA  --   --   --  139  --  135  --   --  134* 134* 131* 132*  K  --   --   --  5.9*   < > 3.9  --   --  3.5 4.4 4.3 3.6  CL  --   --   --  109  --  105  --   --  101 97* 97* 99  CO2  --   --   --  11*  --  18*  --   --  23 25 27 24   GLUCOSE  --   --   --  103*  --  129*  --   --  136* 120* 114* 148*  BUN  --   --   --  39*  --  40*  --   --  36* 38* 40* 41*  CREATININE  --   --   --  3.28*  --  2.92*  --   --  2.69* 2.55* 2.49* 2.35*  CALCIUM   --   --   --  9.2  --  8.4*  --   --  8.3* 8.1* 7.9* 7.9*  AST  --   --   --   --   --  48*  --   --  36 28 31 32  ALT  --   --   --   --   --  27  --   --  25 22 19 20   ALKPHOS  --   --   --   --   --  51  --   --  51 59 52 52  BILITOT  --   --   --   --   --  3.3*  --   --  2.4* 2.1* 2.3* 2.0*  ALBUMIN  --   --    --   --   --  2.2*  --   --  2.2* 2.1* 2.0* 2.1*  MG  --   --   --   --   --  1.6*  --   --  1.7 2.5* 1.8 1.7  PROCALCITON  --  0.59  --   --   --   --   --   --   --   --   --   --   LATICACIDVEN 5.1*  --  >9.0* 7.6*  --   --  3.0* 3.1*  --   --   --   --   BNP  --  >4,500.0*  --   --   --   --   --   --   --   --   --   --    < > = values in this interval not displayed.    ------------------------------------------------------------------------------------------------------------------ No results for  input(s): CHOL, HDL, LDLCALC, TRIG, CHOLHDL, LDLDIRECT in the last 72 hours.  No results found for: HGBA1C ------------------------------------------------------------------------------------------------------------------ No results for input(s): TSH, T4TOTAL, T3FREE, THYROIDAB in the last 72 hours.  Invalid input(s): FREET3  Cardiac Enzymes No results for input(s): CKMB, TROPONINI, MYOGLOBIN in the last 168 hours.  Invalid input(s): CK ------------------------------------------------------------------------------------------------------------------    Component Value Date/Time   BNP >4,500.0 (H) 02/10/2024 2028    CBG: No results for input(s): GLUCAP in the last 168 hours.  Recent Results (from the past 240 hours)  Resp panel by RT-PCR (RSV, Flu A&B, Covid) Anterior Nasal Swab     Status: None   Collection Time: 02/10/24  8:51 AM   Specimen: Anterior Nasal Swab  Result Value Ref Range Status   SARS Coronavirus 2 by RT PCR NEGATIVE NEGATIVE Final   Influenza A by PCR NEGATIVE NEGATIVE Final   Influenza B by PCR NEGATIVE NEGATIVE Final    Comment: (NOTE) The Xpert Xpress SARS-CoV-2/FLU/RSV plus assay is intended as an aid in the diagnosis of influenza from Nasopharyngeal swab specimens and should not be used as a sole basis for treatment. Nasal washings and aspirates are unacceptable for Xpert Xpress SARS-CoV-2/FLU/RSV testing.  Fact Sheet  for Patients: BloggerCourse.com  Fact Sheet for Healthcare Providers: SeriousBroker.it  This test is not yet approved or cleared by the [place]  FDA and has been authorized for detection and/or diagnosis of SARS-CoV-2 by FDA under an Emergency Use Authorization (EUA). This EUA will remain in effect (meaning this test can be used) for the duration of the COVID-19 declaration under Section 564(b)(1) of the Act, 21 U.S.C. section 360bbb-3(b)(1), unless the authorization is terminated or revoked.     Resp Syncytial Virus by PCR NEGATIVE NEGATIVE Final    Comment: (NOTE) Fact Sheet for Patients: BloggerCourse.com  Fact Sheet for Healthcare Providers: SeriousBroker.it  This test is not yet approved or cleared by the [place]  FDA and has been authorized for detection and/or diagnosis of SARS-CoV-2 by FDA under an Emergency Use Authorization (EUA). This EUA will remain in effect (meaning this test can be used) for the duration of the COVID-19 declaration under Section 564(b)(1) of the Act, 21 U.S.C. section 360bbb-3(b)(1), unless the authorization is terminated or revoked.  Performed at [facility], [address] [place], [place] [address]   Culture, blood (routine x 2)     Status: None   Collection Time: 02/10/24  9:00 AM   Specimen: BLOOD RIGHT HAND  Result Value Ref Range Status   Specimen Description BLOOD RIGHT HAND  Final   Special Requests   Final    BOTTLES DRAWN AEROBIC AND ANAEROBIC Blood Culture results may not be optimal due to an inadequate volume of blood received in culture bottles   Culture   Final    NO GROWTH 5 DAYS Performed at [facility], [address], [place], [place] [address]    Report Status 02/15/2024 FINAL  Final  Urine Culture     Status: Abnormal   Collection Time: 02/10/24 11:17 AM   Specimen: Urine, Random  Result Value  Ref Range Status   Specimen Description URINE, RANDOM  Final   Special Requests   Final    NONE Reflexed from [id number] Performed at [facility], [address], [place], [place] [address]    Culture >=100,000 COLONIES/mL ENTEROCOCCUS FAECALIS (A)  Final   Report Status 02/13/2024 FINAL  Final   Organism ID, Bacteria ENTEROCOCCUS FAECALIS (A)  Final  Susceptibility   Enterococcus faecalis - MIC*    AMPICILLIN <=2 SENSITIVE Sensitive     NITROFURANTOIN <=16 SENSITIVE Sensitive     VANCOMYCIN 1 SENSITIVE Sensitive     * >=100,000 COLONIES/mL ENTEROCOCCUS FAECALIS  Culture, blood (routine x 2)     Status: None   Collection Time: 02/10/24  8:28 PM   Specimen: BLOOD RIGHT HAND  Result Value Ref Range Status   Specimen Description BLOOD RIGHT HAND  Final   Special Requests   Final    BOTTLES DRAWN AEROBIC AND ANAEROBIC Blood Culture results may not be optimal due to an inadequate volume of blood received in culture bottles   Culture   Final    NO GROWTH 5 DAYS Performed at [facility], [address] [place], [place] [address]    Report Status 02/15/2024 FINAL  Final     Radiology Studies: No results found.  Nilda Fendt, MD, PhD [facility]  Between 7 am - 7 pm I am available, please contact me via Amion (for emergencies) or Securechat (non urgent messages)  Between 7 pm - 7 am I am not available, please contact night coverage MD/APP via Amion

## 2024-02-16 NOTE — Progress Notes (Signed)
 Advanced Heart Failure Rounding Note  Cardiologist: Annabella Scarce, MD   Chief Complaint: Cardiogenic shock  Subjective:    Remains off dobutamine .  Feeling better than he did yesterday. Able to eat breakfast. No dyspnea at rest. Reports he is not strong enough to get out of bed on his own.  He is open to considering hospice.   Objective:   Weight Range: 62.6 kg Body mass index is 17.25 kg/m.   Vital Signs:   Temp:  [97.7 F (36.5 C)-98.7 F (37.1 C)] 97.7 F (36.5 C) (07/21 0225) Pulse Rate:  [81-87] 83 (07/21 0225) Resp:  [17-25] 19 (07/21 0225) BP: (86-104)/(53-65) 104/57 (07/21 0225) SpO2:  [98 %-100 %] 100 % (07/21 0225) Weight:  [62.6 kg] 62.6 kg (07/21 0500) Last BM Date : 02/13/24  Weight change: Filed Weights   02/14/24 0630 02/15/24 0530 02/16/24 0500  Weight: 63.3 kg 63.1 kg 62.6 kg    Intake/Output:   Intake/Output Summary (Last 24 hours) at 02/16/2024 0701 Last data filed at 02/15/2024 2037 Gross per 24 hour  Intake 250 ml  Output 600 ml  Net -350 ml      Physical Exam    General:  Cachectic. Chronically ill appearing. Neck: JVP 6-7 Cor: Regular rate & rhythm. No murmurs. Lungs: clear Abdomen: soft, nontender, nondistended.  Extremities: no edema Neuro: alert & orientedx3. Affect pleasant     Labs    CBC Recent Labs    02/15/24 0500 02/16/24 0215  WBC 3.6* 4.9  HGB 7.7* 7.4*  HCT 24.6* 24.2*  MCV 74.8* 75.9*  PLT 84* 87*   Basic Metabolic Panel Recent Labs    92/79/74 0500 02/16/24 0215  NA 131* 132*  K 4.3 3.6  CL 97* 99  CO2 27 24  GLUCOSE 114* 148*  BUN 40* 41*  CREATININE 2.49* 2.35*  CALCIUM  7.9* 7.9*  MG 1.8 1.7  PHOS 2.3* 2.2*   Liver Function Tests Recent Labs    02/15/24 0500 02/16/24 0215  AST 31 32  ALT 19 20  ALKPHOS 52 52  BILITOT 2.3* 2.0*  PROT 5.2* 5.5*  ALBUMIN 2.0* 2.1*   No results for input(s): LIPASE, AMYLASE in the last 72 hours. Cardiac Enzymes No results for  input(s): CKTOTAL, CKMB, CKMBINDEX, TROPONINI in the last 72 hours.  BNP: BNP (last 3 results) Recent Labs    06/14/23 2358 06/24/23 1511 02/10/24 2028  BNP 2,436.6* >4,500.0* >4,500.0*    ProBNP (last 3 results) No results for input(s): PROBNP in the last 8760 hours.   D-Dimer No results for input(s): DDIMER in the last 72 hours. Hemoglobin A1C No results for input(s): HGBA1C in the last 72 hours. Fasting Lipid Panel No results for input(s): CHOL, HDL, LDLCALC, TRIG, CHOLHDL, LDLDIRECT in the last 72 hours. Thyroid Function Tests No results for input(s): TSH, T4TOTAL, T3FREE, THYROIDAB in the last 72 hours.  Invalid input(s): FREET3  Other results:    Medications:     Scheduled Medications:  atorvastatin   40 mg Oral QPM   Chlorhexidine  Gluconate Cloth  6 each Topical Daily   feeding supplement  237 mL Oral TID BM   folic acid   1 mg Oral Daily   guaiFENesin   600 mg Oral BID   mexiletine  200 mg Oral Q12H   multivitamin with minerals  1 tablet Oral Daily   sodium chloride  flush  10-40 mL Intracatheter Q12H   sodium chloride  flush  3 mL Intravenous Q12H   tamsulosin   0.4 mg Oral  QPC breakfast   thiamine   100 mg Oral Daily    Infusions:  potassium PHOSPHATE  IVPB (in mmol) 15 mmol (02/16/24 0648)     PRN Medications: acetaminophen  **OR** acetaminophen , albuterol , melatonin, ondansetron  **OR** ondansetron  (ZOFRAN ) IV, sodium chloride  flush    Patient Profile  73 y.o. male with history of chronic systolic heart failure, CAD, COPD, CKD IV, HTN, HLD, hx cocaine abuse, tobacco use, recurrent UTIs and poor compliance, admitted w/ a/c systolic heart failure w/ low-output.   Assessment/Plan   1. Acute on Chronic Systolic Heart Failure w/ Cardiogenic Shock  - Diagnosed with HF in 2019 while living in [place]  - NICM. Has CAD but severity of CM out of proportion to degree of CAD - cocaine and ETOH use likely contributing   - EF  has been in 25-30% range, most recently < 20% on echo 04/24. Offered ICD but has declined - now admitted w/ NYHA IV symptoms and volume overload w/ CS, LA > 9.0. Echo w/ Bi-V failure, EF 20-25%, RV severely reduced  - Initially placed on DBA for diuresis and while patient was in extremis, now weaned off - BP soft but would avoid midodrine. MAP goal >60 but if actively worsening would recommend transition to comfort care - Would not restart inotropes. Suspect end-stage HF, cardiac cachexia and worsening renal failure.  He is not a candidate for advanced therapies given substance use and poor compliance.  Not a candidate for home inotropes either.  Palliative care on board for GOC discussions. He is considering home hospice. Now DNR.  2. PVCs - Improved - Continue mexiletine 200 BID - Supp K and Mag   3. AKI on CKD IV  - cardiorenal from low output  - Scr peaked at 3.3 - Scr now back to baseline, 2.35 today - no SLGT2i given h/o frequent UTIs    4. Hyperkalemia - In setting of ARF - Resolved   5. Polysubstance Use - UDS + for cocaine  - needs to quit    6. ID. ? CAP + UTI  - No leukocytosis and Afebrile. Procal 0.59 - Treated initially with empiric abx, now stopped d/t low suspicion for active infection.    Length of Stay: 6  Ventura Hollenbeck N, PA-C  02/16/2024, 7:01 AM  Advanced Heart Failure Team Pager [phone] (M-F; 7a - 5p)  Please contact [facility] for night-coverage after hours (5p -7a ) and weekends on [email or web address]

## 2024-02-17 ENCOUNTER — Other Ambulatory Visit (HOSPITAL_COMMUNITY): Payer: Self-pay

## 2024-02-17 DIAGNOSIS — J189 Pneumonia, unspecified organism: Secondary | ICD-10-CM | POA: Diagnosis not present

## 2024-02-17 LAB — COMPREHENSIVE METABOLIC PANEL WITH GFR
ALT: 21 U/L (ref 0–44)
AST: 35 U/L (ref 15–41)
Albumin: 2.2 g/dL — ABNORMAL LOW (ref 3.5–5.0)
Alkaline Phosphatase: 51 U/L (ref 38–126)
Anion gap: 7 (ref 5–15)
BUN: 38 mg/dL — ABNORMAL HIGH (ref 8–23)
CO2: 25 mmol/L (ref 22–32)
Calcium: 8 mg/dL — ABNORMAL LOW (ref 8.9–10.3)
Chloride: 100 mmol/L (ref 98–111)
Creatinine, Ser: 2.39 mg/dL — ABNORMAL HIGH (ref 0.61–1.24)
GFR, Estimated: 28 mL/min — ABNORMAL LOW (ref >60)
Glucose, Bld: 102 mg/dL — ABNORMAL HIGH (ref 70–99)
Potassium: 4 mmol/L (ref 3.5–5.1)
Sodium: 132 mmol/L — ABNORMAL LOW (ref 135–145)
Total Bilirubin: 2.6 mg/dL — ABNORMAL HIGH (ref 0.0–1.2)
Total Protein: 5.5 g/dL — ABNORMAL LOW (ref 6.5–8.1)

## 2024-02-17 LAB — BPAM RBC
Blood Product Expiration Date: 202507232359
ISSUE DATE / TIME: 202507210956
Unit Type and Rh: 9500

## 2024-02-17 LAB — CBC
HCT: 26 % — ABNORMAL LOW (ref 39.0–52.0)
Hemoglobin: 8.1 g/dL — ABNORMAL LOW (ref 13.0–17.0)
MCH: 23.9 pg — ABNORMAL LOW (ref 26.0–34.0)
MCHC: 31.2 g/dL (ref 30.0–36.0)
MCV: 76.7 fL — ABNORMAL LOW (ref 80.0–100.0)
Platelets: 82 K/uL — ABNORMAL LOW (ref 150–400)
RBC: 3.39 MIL/uL — ABNORMAL LOW (ref 4.22–5.81)
RDW: 22.4 % — ABNORMAL HIGH (ref 11.5–15.5)
WBC: 4.3 K/uL (ref 4.0–10.5)
nRBC: 0.7 % — ABNORMAL HIGH (ref 0.0–0.2)

## 2024-02-17 LAB — TYPE AND SCREEN
ABO/RH(D): O POS
Antibody Screen: NEGATIVE
Unit division: 0

## 2024-02-17 LAB — MAGNESIUM: Magnesium: 1.4 mg/dL — ABNORMAL LOW (ref 1.7–2.4)

## 2024-02-17 MED ORDER — TAMSULOSIN HCL 0.4 MG PO CAPS
0.4000 mg | ORAL_CAPSULE | Freq: Every day | ORAL | 0 refills | Status: DC
  Filled 2024-02-17: qty 90, 90d supply, fill #0

## 2024-02-17 MED ORDER — MAGNESIUM SULFATE 2 GM/50ML IV SOLN
2.0000 g | Freq: Once | INTRAVENOUS | Status: AC
  Administered 2024-02-17: 2 g via INTRAVENOUS
  Filled 2024-02-17: qty 50

## 2024-02-17 MED ORDER — MEXILETINE HCL 200 MG PO CAPS
200.0000 mg | ORAL_CAPSULE | Freq: Two times a day (BID) | ORAL | 0 refills | Status: DC
  Filled 2024-02-17: qty 60, 30d supply, fill #0

## 2024-02-17 MED ORDER — TORSEMIDE 20 MG PO TABS
40.0000 mg | ORAL_TABLET | Freq: Every day | ORAL | 0 refills | Status: DC | PRN
Start: 2024-02-17 — End: 2024-04-01
  Filled 2024-02-17: qty 180, 90d supply, fill #0

## 2024-02-17 NOTE — TOC Progression Note (Signed)
 Transition of Care [facility]) - Progression Note    Patient Details  Name: Joseph Tanner MRN: [id number] Date of Birth: 08-13-1950  Transition [facility] CM/SW Contact  [name], [place] Phone Number: 02/17/2024, 2:12 PM  Clinical Narrative:   CSW spoke with patients spouse, [name], about updated PT recs for SNF. [name] would like CSW to send referrals at this time.   CSW spoke with [name] about backup care plan if insurance denies SNF authorization. [name] stated we would have to cross that road if we get there. CSW will follow up with patient and family with SNF bed offers.   TOC will continue to follow.    Expected Discharge Plan: Skilled Nursing Facility Barriers to Discharge: Continued Medical Work up, SNF Pending bed offer, English as a second language teacher  Expected Discharge Plan and Services   Discharge Planning Services: CM Consult Post Acute Care Choice: Hospice Living arrangements for the past 2 months: Apartment                           HH Arranged: RN HH Agency:  Photographer) Date HH Agency Contacted: 02/17/24 Time HH Agency Contacted: 1002 Representative spoke with at [facility] Agency: Melissa   Social Determinants of Health (SDOH) Interventions SDOH Screenings   Food Insecurity: No Food Insecurity (02/10/2024)  Housing: Low Risk  (02/10/2024)  Transportation Needs: No Transportation Needs (02/10/2024)  Utilities: Not At Risk (02/10/2024)  Alcohol Screen: Low Risk  (06/17/2023)  Financial Resource Strain: Low Risk  (06/17/2023)  Social Connections: Socially Isolated (02/10/2024)  Tobacco Use: High Risk (02/13/2024)    Readmission Risk Interventions    02/11/2024   11:47 AM  Readmission Risk Prevention Plan  Transportation Screening Complete  PCP or Specialist Appt within 5-7 Days Complete  Home Care Screening Complete  Medication Review (RN CM) Complete

## 2024-02-17 NOTE — Progress Notes (Addendum)
 PROGRESS NOTE  Joseph Tanner FMW:[id number] DOB: 13-Jun-1951 DOA: 02/10/2024 PCP: Pcp, No   LOS: 7 days   Brief Narrative / Interim history: 73 y.o. male with medical history significant of hypertension, hyperlipidemia, combined systolic and diastolic CHF, NSTEMI, CAD s/p PCI, COPD, CKD stage IV, and cocaine abuse presents with shortness of breath.  Patient reports similar episode last November, he was hospitalized, improved and sent home.  Has not had any follow-up since.  Current symptoms started for the past week or so.  He was found to be fluid overload in the ER, admitted to the hospital and placed on diuretics.  Cardiology was consulted.  He reports he quit tobacco and alcohol for several months, last time he used cocaine was about 4 weeks ago  Subjective / 24h Interval events: Doing well today.  No chest pain, no shortness of breath, no palpitations.  Feels weak.  Assesement and Plan: Principal problem Acute on chronic combined CHF -patient admitted to the hospital with significant fluid overload.  Most recent 2D echo last year showed EF 15% with grade 3 diastolic dysfunction.  2D echocardiogram done 02/11/2024 shows LVEF 20-25%, global hypokinesis, RV systolic function was also severely reduced - Cardiology consulted, appreciate input, initially was on dobutamine  with improvement, however when this was weaned off he became hypotensive.  Palliative consulted for goals of care since he is end-stage CHF, and he is now DNR.  Will not reinitiate inotropes per cardiology, he is not a candidate for advanced therapies given drug use  Active problems Acute kidney injury on chronic kidney disease stage IV -baseline creatinine 2.2-2.4.  Up to 3.2 on 7/16, continues to improve and has stabilized around his baseline  Possible pneumonia, ruled out -based on the CT scan, but not sure that he truly has pneumonia with evidence of fluid overload, no fever, low procalcitonin and no white count  elevation.  Cultures have remained negative, he is afebrile without any respiratory symptoms, antibiotics were discontinued 7/17  ?  Urinary retention -start Flomax , urinating well now  Elevated lactic acid, anion gap metabolic acidosis-with concern of poor perfusion, renal failure.  He was briefly on sodium bicarb, now discontinued.  Acidosis resolved  Diarrhea-resolved  UTI-asymptomatic, this is likely bacteriuria  CAD, elevated troponin -likely demand ischemia, not in a pattern consistent with ACS.  No chest pain.  Left heart cath March 2022 with severe stenosis small Lcx and mild moderate stenosis RCA that was to be treated medically.   Hyperkalemia-due to renal failure.  Potassium now normalized  Hypophosphatemia -replenish phosphorus again today  Hypomagnesemia-magnesium  normal  Cocaine use -none for the past 4 weeks per patient, however UDS still positive  Thrombocytopenia, anemia -fairly chronic, in the setting of renal disease, hemoglobin trending down and is in the low 7 range.  Due to his underlying heart disease, will prefer to keep above 8, will transfuse unit of packed red blood cells  Disposition-he initially agreed to go home with hospice, however when hospice representative discussed with him, he now wants to go to SNF as he is feeling very weak.  Family also is unable to assist him at home.  PT reconsulted today.  Discussed with TOC as well.   Scheduled Meds:  atorvastatin   40 mg Oral QPM   Chlorhexidine  Gluconate Cloth  6 each Topical Daily   feeding supplement  237 mL Oral TID BM   folic acid   1 mg Oral Daily   guaiFENesin   600 mg Oral BID   mexiletine  200 mg Oral Q12H   multivitamin with minerals  1 tablet Oral Daily   sodium chloride  flush  10-40 mL Intracatheter Q12H   sodium chloride  flush  3 mL Intravenous Q12H   tamsulosin   0.4 mg Oral QPC breakfast   thiamine   100 mg Oral Daily   Continuous Infusions:  magnesium  sulfate bolus IVPB 2 g (02/17/24 0945)     PRN Meds:.acetaminophen  **OR** acetaminophen , albuterol , melatonin, ondansetron  **OR** ondansetron  (ZOFRAN ) IV, sodium chloride  flush  Current Outpatient Medications  Medication Instructions   albuterol  (VENTOLIN  HFA) 108 (90 Base) MCG/ACT inhaler 2 puffs, Every 6 hours PRN   atorvastatin  (LIPITOR) 40 mg, Oral, Every evening   carvedilol  (COREG ) 3.125 mg, Oral, Daily   ibuprofen (ADVIL) 1,600 mg, Oral, As needed   isosorbide-hydrALAZINE  (BIDIL) 20-37.5 MG tablet 0.5 tablets, Oral, 3 times daily   mexiletine (MEXITIL ) 200 mg, Oral, Every 12 hours   Multiple Vitamin (MULTIVITAMIN WITH MINERALS) TABS tablet 1 tablet, Oral, Daily   sacubitril -valsartan  (ENTRESTO ) 24-26 MG 1 tablet, Oral, Every morning   spironolactone  (ALDACTONE ) 25 mg, Oral, Daily   tamsulosin  (FLOMAX ) 0.4 mg, Oral, Daily after breakfast   torsemide  (DEMADEX ) 40 mg, Oral, Daily PRN    Diet Orders (From admission, onward)     Start     Ordered   02/12/24 1502  Diet 2 gram sodium Room service appropriate? Yes with Assist; Fluid consistency: Thin  Diet effective now       Question Answer Comment  Room service appropriate? Yes with Assist   Fluid consistency: Thin      02/12/24 1502            DVT prophylaxis: SCDs Start: 02/10/24 1140   Lab Results  Component Value Date   PLT 82 (L) 02/17/2024      Code Status: Do not attempt resuscitation (DNR) - Comfort care  Family Communication: No family at bedside  Status is: Inpatient Remains inpatient appropriate because: Severity of illness   Level of care: Progressive  Consultants:  Cardiology  Objective: Vitals:   02/16/24 1926 02/17/24 0027 02/17/24 0400 02/17/24 0719  BP: 106/68 108/64 103/64 98/66  Pulse: 91 96 93 89  Resp: (!) 21 (!) 22 17 18   Temp: 98.2 F (36.8 C) 98.6 F (37 C) 98.4 F (36.9 C) 97.8 F (36.6 C)  TempSrc: Oral Oral Oral Oral  SpO2: 100% 100% 100% 100%  Weight:   64 kg   Height:        Intake/Output Summary (Last  24 hours) at 02/17/2024 1039 Last data filed at 02/17/2024 0956 Gross per 24 hour  Intake 675 ml  Output 1050 ml  Net -375 ml   Wt Readings from Last 3 Encounters:  02/17/24 64 kg  06/24/23 67 kg  06/14/23 64.6 kg    Examination:  Constitutional: NAD Eyes: lids and conjunctivae normal, no scleral icterus ENMT: mmm Neck: normal, supple Respiratory: clear to auscultation bilaterally, no wheezing, no crackles. Normal respiratory effort.  Cardiovascular: Regular rate and rhythm, no murmurs / rubs / gallops. No LE edema. Abdomen: soft, no distention, no tenderness. Bowel sounds positive.   Data Reviewed: I have independently reviewed following labs and imaging studies   CBC Recent Labs  Lab 02/13/24 0448 02/14/24 0624 02/15/24 0500 02/16/24 0215 02/17/24 0540  WBC 4.1 3.9* 3.6* 4.9 4.3  HGB 8.4* 8.2* 7.7* 7.4* 8.1*  HCT 26.2* 25.7* 24.6* 24.2* 26.0*  PLT 91* 84* 84* 87* 82*  MCV 73.2* 73.9* 74.8* 75.9* 76.7*  MCH  23.5* 23.6* 23.4* 23.2* 23.9*  MCHC 32.1 31.9 31.3 30.6 31.2  RDW 22.5* 22.6* 22.6* 22.5* 22.4*    Recent Labs  Lab 02/10/24 1339 02/10/24 2028 02/10/24 2142 02/11/24 0326 02/11/24 1508 02/12/24 1030 02/12/24 1440 02/13/24 0448 02/14/24 0624 02/15/24 0500 02/16/24 0215 02/17/24 0540  NA  --   --   --  139   < >  --   --  134* 134* 131* 132* 132*  K  --   --   --  5.9*   < >  --   --  3.5 4.4 4.3 3.6 4.0  CL  --   --   --  109   < >  --   --  101 97* 97* 99 100  CO2  --   --   --  11*   < >  --   --  23 25 27 24 25   GLUCOSE  --   --   --  103*   < >  --   --  136* 120* 114* 148* 102*  BUN  --   --   --  39*   < >  --   --  36* 38* 40* 41* 38*  CREATININE  --   --   --  3.28*   < >  --   --  2.69* 2.55* 2.49* 2.35* 2.39*  CALCIUM   --   --   --  9.2   < >  --   --  8.3* 8.1* 7.9* 7.9* 8.0*  AST  --   --   --   --    < >  --   --  36 28 31 32 35  ALT  --   --   --   --    < >  --   --  25 22 19 20 21   ALKPHOS  --   --   --   --    < >  --   --  51 59  52 52 51  BILITOT  --   --   --   --    < >  --   --  2.4* 2.1* 2.3* 2.0* 2.6*  ALBUMIN  --   --   --   --    < >  --   --  2.2* 2.1* 2.0* 2.1* 2.2*  MG  --   --   --   --    < >  --   --  1.7 2.5* 1.8 1.7 1.4*  PROCALCITON  --  0.59  --   --   --   --   --   --   --   --   --   --   LATICACIDVEN 5.1*  --  >9.0* 7.6*  --  3.0* 3.1*  --   --   --   --   --   BNP  --  >4,500.0*  --   --   --   --   --   --   --   --   --   --    < > = values in this interval not displayed.    ------------------------------------------------------------------------------------------------------------------ No results for input(s): CHOL, HDL, LDLCALC, TRIG, CHOLHDL, LDLDIRECT in the last 72 hours.  No results found for: HGBA1C ------------------------------------------------------------------------------------------------------------------ No results for input(s): TSH, T4TOTAL, T3FREE, THYROIDAB in the last 72 hours.  Invalid input(s): FREET3  Cardiac Enzymes No results  for input(s): CKMB, TROPONINI, MYOGLOBIN in the last 168 hours.  Invalid input(s): CK ------------------------------------------------------------------------------------------------------------------    Component Value Date/Time   BNP >4,500.0 (H) 02/10/2024 2028    CBG: No results for input(s): GLUCAP in the last 168 hours.  Recent Results (from the past 240 hours)  Resp panel by RT-PCR (RSV, Flu A&B, Covid) Anterior Nasal Swab     Status: None   Collection Time: 02/10/24  8:51 AM   Specimen: Anterior Nasal Swab  Result Value Ref Range Status   SARS Coronavirus 2 by RT PCR NEGATIVE NEGATIVE Final   Influenza A by PCR NEGATIVE NEGATIVE Final   Influenza B by PCR NEGATIVE NEGATIVE Final    Comment: (NOTE) The Xpert Xpress SARS-CoV-2/FLU/RSV plus assay is intended as an aid in the diagnosis of influenza from Nasopharyngeal swab specimens and should not be used as a sole basis for treatment. Nasal  washings and aspirates are unacceptable for Xpert Xpress SARS-CoV-2/FLU/RSV testing.  Fact Sheet for Patients: BloggerCourse.com  Fact Sheet for Healthcare Providers: SeriousBroker.it  This test is not yet approved or cleared by the [place]  FDA and has been authorized for detection and/or diagnosis of SARS-CoV-2 by FDA under an Emergency Use Authorization (EUA). This EUA will remain in effect (meaning this test can be used) for the duration of the COVID-19 declaration under Section 564(b)(1) of the Act, 21 U.S.C. section 360bbb-3(b)(1), unless the authorization is terminated or revoked.     Resp Syncytial Virus by PCR NEGATIVE NEGATIVE Final    Comment: (NOTE) Fact Sheet for Patients: BloggerCourse.com  Fact Sheet for Healthcare Providers: SeriousBroker.it  This test is not yet approved or cleared by the [place]  FDA and has been authorized for detection and/or diagnosis of SARS-CoV-2 by FDA under an Emergency Use Authorization (EUA). This EUA will remain in effect (meaning this test can be used) for the duration of the COVID-19 declaration under Section 564(b)(1) of the Act, 21 U.S.C. section 360bbb-3(b)(1), unless the authorization is terminated or revoked.  Performed at [facility], [address] [place], [place] [address]   Culture, blood (routine x 2)     Status: None   Collection Time: 02/10/24  9:00 AM   Specimen: BLOOD RIGHT HAND  Result Value Ref Range Status   Specimen Description BLOOD RIGHT HAND  Final   Special Requests   Final    BOTTLES DRAWN AEROBIC AND ANAEROBIC Blood Culture results may not be optimal due to an inadequate volume of blood received in culture bottles   Culture   Final    NO GROWTH 5 DAYS Performed at [facility], [address], [place], [place] [address]    Report Status 02/15/2024 FINAL  Final  Urine Culture      Status: Abnormal   Collection Time: 02/10/24 11:17 AM   Specimen: Urine, Random  Result Value Ref Range Status   Specimen Description URINE, RANDOM  Final   Special Requests   Final    NONE Reflexed from [id number] Performed at [facility], [address] [place], [place] [address]    Culture >=100,000 COLONIES/mL ENTEROCOCCUS FAECALIS (A)  Final   Report Status 02/13/2024 FINAL  Final   Organism ID, Bacteria ENTEROCOCCUS FAECALIS (A)  Final      Susceptibility   Enterococcus faecalis - MIC*    AMPICILLIN <=2 SENSITIVE Sensitive     NITROFURANTOIN <=16 SENSITIVE Sensitive     VANCOMYCIN 1 SENSITIVE Sensitive     * >=100,000  COLONIES/mL ENTEROCOCCUS FAECALIS  Culture, blood (routine x 2)     Status: None   Collection Time: 02/10/24  8:28 PM   Specimen: BLOOD RIGHT HAND  Result Value Ref Range Status   Specimen Description BLOOD RIGHT HAND  Final   Special Requests   Final    BOTTLES DRAWN AEROBIC AND ANAEROBIC Blood Culture results may not be optimal due to an inadequate volume of blood received in culture bottles   Culture   Final    NO GROWTH 5 DAYS Performed at [facility], [address] [place], [place] [address]    Report Status 02/15/2024 FINAL  Final     Radiology Studies: No results found.  Nilda Fendt, MD, PhD [facility]  Between 7 am - 7 pm I am available, please contact me via Amion (for emergencies) or Securechat (non urgent messages)  Between 7 pm - 7 am I am not available, please contact night coverage MD/APP via Amion

## 2024-02-17 NOTE — Progress Notes (Signed)
 Physical Therapy Treatment Patient Details Name: Joseph Tanner MRN: [id number] DOB: Mar 01, 1951 Today's Date: 02/17/2024   History of Present Illness 73 y.o. male presents to [facility] 02/10/24 with SOB. Chest x-ray concerning for aspiration or PNA, +UTI, + cocaine. PMHx: hypertension, hyperlipidemia, combined systolic and diastolic CHF, NSTEMI, CAD s/p PCI, COPD, CKD stage IV, and cocaine abuse. PT asked re-evalute if patient needs SNF on 7/22.    PT Comments  The patient continues to have fatigue with minimal activity, limited overall endurance, and generalized weakness. He now needs minimal assistance with bed mobility and does not appear to be at his baseline level of functional independence. Anticipate patient will require some physical assistance at discharge at this time. Consider rehabilitation < 3 hours/day after this hospital stay.    If plan is discharge home, recommend the following: A little help with walking and/or transfers;A little help with bathing/dressing/bathroom;Assistance with cooking/housework;Assist for transportation;Help with stairs or ramp for entrance   Can travel by private vehicle     No  Equipment Recommendations  Rolling walker (2 wheels)    Recommendations for Other Services       Precautions / Restrictions Precautions Precautions: Fall Recall of Precautions/Restrictions: Intact Restrictions Weight Bearing Restrictions Per Provider Order: No     Mobility  Bed Mobility Overal bed mobility: Needs Assistance Bed Mobility: Supine to Sit, Sit to Supine     Supine to sit: Min assist, Used rails Sit to supine: Min assist, Used rails   General bed mobility comments: assistance required for LE support. cues for task initiation    Transfers                   General transfer comment: patient is able to scoot posteriorly on the bed with supervision for repositioning. he declined attempting to stand due to generalized weakness and fatigue with  minimal activity    Ambulation/Gait                   Stairs             Wheelchair Mobility     Tilt Bed    Modified Rankin (Stroke Patients Only)       Balance Overall balance assessment: Needs assistance Sitting-balance support: Feet supported, Single extremity supported Sitting balance-Leahy Scale: Fair Sitting balance - Comments: patient using more upper body support today to maintain balance due to fatigue                                    Communication Communication Communication: No apparent difficulties  Cognition Arousal: Alert Behavior During Therapy: Flat affect   PT - Cognitive impairments: No apparent impairments                         Following commands: Intact      Cueing Cueing Techniques: Verbal cues  Exercises      General Comments General comments (skin integrity, edema, etc.): patient appears very fatigued with limited mobility. heart rate in the 80's throughout session      Pertinent Vitals/Pain Pain Assessment Pain Assessment: Faces Faces Pain Scale: Hurts little more Pain Location: scrotal area Pain Descriptors / Indicators: Discomfort Pain Intervention(s): Limited activity within patient's tolerance, Monitored during session, Repositioned    Home Living  Prior Function            PT Goals (current goals can now be found in the care plan section) Acute Rehab PT Goals Patient Stated Goal: to get stronger PT Goal Formulation: With patient Time For Goal Achievement: 02/25/24 Potential to Achieve Goals: Good Progress towards PT goals: Progressing toward goals    Frequency    Min 2X/week      PT Plan      Co-evaluation              AM-PAC PT 6 Clicks Mobility   Outcome Measure  Help needed turning from your back to your side while in a flat bed without using bedrails?: A Little Help needed moving from lying on your back to sitting on  the side of a flat bed without using bedrails?: A Little Help needed moving to and from a bed to a chair (including a wheelchair)?: A Little Help needed standing up from a chair using your arms (e.g., wheelchair or bedside chair)?: A Little Help needed to walk in hospital room?: A Lot Help needed climbing 3-5 steps with a railing? : A Lot 6 Click Score: 16    End of Session   Activity Tolerance: Patient limited by fatigue Patient left: in bed;with call bell/phone within reach Nurse Communication: Mobility status PT Visit Diagnosis: Unsteadiness on feet (R26.81);Other abnormalities of gait and mobility (R26.89);Muscle weakness (generalized) (M62.81)     Time: 8674-8662 PT Time Calculation (min) (ACUTE ONLY): 12 min  Charges:    $Therapeutic Activity: 8-22 mins PT General Charges $$ ACUTE PT VISIT: 1 Visit                     [name], PT, MPT   [name] 02/17/2024, 1:47 PM

## 2024-02-17 NOTE — Progress Notes (Signed)
 Nutrition Brief Note  Chart reviewed. Pt now transitioning to comfort care.  No further nutrition interventions planned at this time.  Please re-consult as needed.   Noya Santarelli Daml-Budig, RDN, LDN Registered Dietitian Nutritionist RD Inpatient Contact Info in [place]

## 2024-02-17 NOTE — TOC Progression Note (Addendum)
 Transition of Care [facility]) - Progression Note    Patient Details  Name: Joseph Tanner MRN: [id number] Date of Birth: [birth year]  Transition of [facility] CM/SW Contact  Justina Delcia Czar, RN Phone Number: [phone] 02/17/2024, 5:32 PM  Clinical Narrative:    [name] to wife, [name], reviewed bed choices with wife, [name]. She gave permission to start insurance auth with [facility]. [facility] Office closed to submit to SNF rep to start auth with [facility]. TOC CSW will follow up on SNF referral.     [facility] will provide outpatient Palliative Services. Will updated rep when auth complete for SNF.     Expected Discharge Plan: Skilled Nursing Facility Barriers to Discharge: Continued Medical Work up, SNF Pending bed offer, English as a second language teacher               Expected Discharge Plan and Services   Discharge Planning Services: CM Consult Post Acute Care Choice: Hospice Living arrangements for the past 2 months: Apartment                           HH Arranged: RN HH Agency:  Photographer) Date HH Agency Contacted: 02/17/24 Time HH Agency Contacted: 1002 Representative spoke with at [facility] Agency: Melissa   Social Drivers of Health (SDOH) Interventions SDOH Screenings   Food Insecurity: No Food Insecurity (02/10/2024)  Housing: Low Risk  (02/10/2024)  Transportation Needs: No Transportation Needs (02/10/2024)  Utilities: Not At Risk (02/10/2024)  Alcohol Screen: Low Risk  (06/17/2023)  Financial Resource Strain: Low Risk  (06/17/2023)  Social Connections: Socially Isolated (02/10/2024)  Tobacco Use: High Risk (02/13/2024)    Readmission Risk Interventions    02/11/2024   11:47 AM  Readmission Risk Prevention Plan  Transportation Screening Complete  PCP or Specialist Appt within 5-7 Days Complete  Home Care Screening Complete  Medication Review (RN CM) Complete

## 2024-02-17 NOTE — TOC Progression Note (Addendum)
 Transition of Care [facility]) - Progression Note    Patient Details  Name: Joseph Tanner MRN: [id number] Date of Birth: January 25, 1951  Transition of Care [facility]) CM/SW Contact  Justina Delcia Czar, RN Phone Number: [phone] 02/17/2024, 10:03 AM  Clinical Narrative:    Spoke to pt at bedside. States he lives alone and really weak. Gave permissoin to speak to dtr or wife. States he is agreeable to [facility] or SNF rehab to get stronger. Gave permission to create FL2 and fax referral to SNF. Explained Residential Hospice philosophy of prognosis of 2 weeks or less. Will discuss with [facility] rep to see if pt qualifies for their GIP program.  Will need PT/OT recommendations for SNF rehab.  Discussed SNF rehab with outpatient palliative.  Message sent to attending.  Medicare.gov list and rating for Home Hospice/Residential Hospice and SNF on chart and given to pt.  Contacted dtr and states she works. Pt lives alone and family had concerns he would not be able to care for himself in the home.   Received call from pt's wife, Joseph Tanner. States she is familiar with [facility]. She would still like all bed offers to review.    Expected Discharge Plan: Home w Hospice Care Barriers to Discharge: Continued Medical Work up  Expected Discharge Plan and Services   Discharge Planning Services: CM Consult Post Acute Care Choice: Hospice Living arrangements for the past 2 months: Apartment                           HH Arranged: RN HH Agency:  Photographer) Date HH Agency Contacted: 02/17/24 Time HH Agency Contacted: 1002 Representative spoke with at [facility] Agency: Melissa   Social Determinants of Health (SDOH) Interventions SDOH Screenings   Food Insecurity: No Food Insecurity (02/10/2024)  Housing: Low Risk  (02/10/2024)  Transportation Needs: No Transportation Needs (02/10/2024)  Utilities: Not At Risk (02/10/2024)  Alcohol Screen: Low Risk  (06/17/2023)  Financial  Resource Strain: Low Risk  (06/17/2023)  Social Connections: Socially Isolated (02/10/2024)  Tobacco Use: High Risk (02/13/2024)    Readmission Risk Interventions    02/11/2024   11:47 AM  Readmission Risk Prevention Plan  Transportation Screening Complete  PCP or Specialist Appt within 5-7 Days Complete  Home Care Screening Complete  Medication Review (RN CM) Complete

## 2024-02-17 NOTE — Plan of Care (Signed)

## 2024-02-17 NOTE — [place] FL2 (Signed)
 [place]  MEDICAID FL2 LEVEL OF CARE FORM     IDENTIFICATION  Patient Name: Joseph Tanner Birthdate: [birth year] Sex: male Admission Date (Current Location): 02/10/2024  [facility] and [place] Number:  Producer, television/film/video and Address:  [facility] [place], [address] [place], [place] [address]      Provider Number: [id number]  Attending Physician Name and Address:  Trixie Nilda HERO, MD  Relative Name and Phone Number:       Current Level of Care: Hospital Recommended Level of Care: Skilled Nursing Facility Prior Approval Number:    Date Approved/Denied:   PASRR Number: [id number]  Discharge Plan: SNF    Current Diagnoses: Patient Active Problem List   Diagnosis Date Noted   Cardiogenic shock (HCC) 02/12/2024   CAP (community acquired pneumonia) 02/10/2024   Urinary tract infection 02/10/2024   Lactic acidosis 02/10/2024   Normal anion gap metabolic acidosis 02/10/2024   Elevated troponin 02/10/2024   Hyperkalemia 02/10/2024   Pancytopenia (HCC) 02/10/2024   Hypomagnesemia 02/10/2024   Hypotension 06/17/2023   Cocaine abuse (HCC) 06/17/2023   Acute on chronic combined systolic and diastolic CHF (congestive heart failure) (HCC) 06/17/2023   Acute kidney injury superimposed on chronic kidney disease (HCC) 06/14/2023   Pure hypercholesterolemia 03/15/2021   Demand ischemia (HCC) 11/21/2020   Coronary artery disease 11/21/2020   CKD (chronic kidney disease) stage 3, GFR 30-59 ml/min (HCC) 11/21/2020   Nephrolithiasis 11/21/2020   Pyuria 11/21/2020   Protein-calorie malnutrition, severe 11/21/2020   Chronic combined systolic and diastolic heart failure (HCC) 11/20/2020   NSTEMI (non-ST elevated myocardial infarction) (HCC) 10/14/2020    Orientation RESPIRATION BLADDER Height & Weight     Self, Time, Situation, Place  Normal Continent, External catheter Weight: 141 lb 3.2 oz (64 kg) Height:  6' 3 (190.5 cm)  BEHAVIORAL SYMPTOMS/MOOD  NEUROLOGICAL BOWEL NUTRITION STATUS      Continent Diet (see dc summary)  AMBULATORY STATUS COMMUNICATION OF NEEDS Skin   Extensive Assist Verbally Normal                       Personal Care Assistance Level of Assistance  Bathing, Feeding, Dressing Bathing Assistance: Limited assistance Feeding assistance: Limited assistance Dressing Assistance: Limited assistance     Functional Limitations Info  Sight, Speech, Hearing Sight Info: Adequate Hearing Info: Adequate Speech Info: Adequate    SPECIAL CARE FACTORS FREQUENCY  PT (By licensed PT), OT (By licensed OT)     PT Frequency: 5x week OT Frequency: 5x week            Contractures Contractures Info: Not present    Additional Factors Info  Code Status, Allergies Code Status Info: DNR Allergies Info: NKA           Current Medications (02/17/2024):  This is the current hospital active medication list Current Facility-Administered Medications  Medication Dose Route Frequency Provider Last Rate Last Admin   acetaminophen  (TYLENOL ) tablet 650 mg  650 mg Oral Q6H PRN Smith, Rondell A, MD       Or   acetaminophen  (TYLENOL ) suppository 650 mg  650 mg Rectal Q6H PRN Claudene Reeves A, MD       albuterol  (PROVENTIL ) (2.5 MG/3ML) 0.083% nebulizer solution 2.5 mg  2.5 mg Nebulization Q6H PRN Smith, Rondell A, MD       atorvastatin  (LIPITOR) tablet 40 mg  40 mg Oral QPM Smith, Rondell A, MD   40 mg at 02/16/24 1737  Chlorhexidine  Gluconate Cloth 2 % PADS 6 each  6 each Topical Daily Gherghe, Costin M, MD   6 each at 02/16/24 1019   feeding supplement (ENSURE PLUS HIGH PROTEIN) liquid 237 mL  237 mL Oral TID BM Gherghe, Costin M, MD   237 mL at 02/16/24 1353   folic acid  (FOLVITE ) tablet 1 mg  1 mg Oral Daily Gherghe, Costin M, MD   1 mg at 02/17/24 9053   guaiFENesin  (MUCINEX ) 12 hr tablet 600 mg  600 mg Oral BID Smith, Rondell A, MD   600 mg at 02/17/24 0946   melatonin tablet 5 mg  5 mg Oral QHS PRN Howerter, Justin B, DO    5 mg at 02/15/24 2036   mexiletine (MEXITIL ) capsule 200 mg  200 mg Oral Q12H Colletta Manuelita Garre, PA-C   200 mg at 02/17/24 1128   multivitamin with minerals tablet 1 tablet  1 tablet Oral Daily Gherghe, Costin M, MD   1 tablet at 02/17/24 9053   ondansetron  (ZOFRAN ) tablet 4 mg  4 mg Oral Q6H PRN Smith, Rondell A, MD       Or   ondansetron  (ZOFRAN ) injection 4 mg  4 mg Intravenous Q6H PRN Smith, Rondell A, MD       sodium chloride  flush (NS) 0.9 % injection 10-40 mL  10-40 mL Intracatheter Q12H Trixie Nilda HERO, MD   10 mL at 02/17/24 1132   sodium chloride  flush (NS) 0.9 % injection 10-40 mL  10-40 mL Intracatheter PRN Gherghe, Costin M, MD       sodium chloride  flush (NS) 0.9 % injection 3 mL  3 mL Intravenous Q12H Smith, Rondell A, MD   3 mL at 02/17/24 0954   tamsulosin  (FLOMAX ) capsule 0.4 mg  0.4 mg Oral QPC breakfast Gherghe, Costin M, MD   0.4 mg at 02/17/24 9053   thiamine  (VITAMIN B1) tablet 100 mg  100 mg Oral Daily Gherghe, Costin M, MD   100 mg at 02/17/24 9053     Discharge Medications: Please see discharge summary for a list of discharge medications.  Relevant Imaging Results:  Relevant Lab Results:   Additional Information SSN [id number]  [name], LCSWA

## 2024-02-18 DIAGNOSIS — N3000 Acute cystitis without hematuria: Secondary | ICD-10-CM | POA: Diagnosis not present

## 2024-02-18 DIAGNOSIS — E872 Acidosis, unspecified: Secondary | ICD-10-CM | POA: Diagnosis not present

## 2024-02-18 DIAGNOSIS — N179 Acute kidney failure, unspecified: Secondary | ICD-10-CM | POA: Diagnosis not present

## 2024-02-18 DIAGNOSIS — E875 Hyperkalemia: Secondary | ICD-10-CM | POA: Diagnosis not present

## 2024-02-18 LAB — BASIC METABOLIC PANEL WITH GFR
Anion gap: 10 (ref 5–15)
BUN: 38 mg/dL — ABNORMAL HIGH (ref 8–23)
CO2: 23 mmol/L (ref 22–32)
Calcium: 8.3 mg/dL — ABNORMAL LOW (ref 8.9–10.3)
Chloride: 100 mmol/L (ref 98–111)
Creatinine, Ser: 2.26 mg/dL — ABNORMAL HIGH (ref 0.61–1.24)
GFR, Estimated: 30 mL/min — ABNORMAL LOW (ref >60)
Glucose, Bld: 103 mg/dL — ABNORMAL HIGH (ref 70–99)
Potassium: 4.1 mmol/L (ref 3.5–5.1)
Sodium: 133 mmol/L — ABNORMAL LOW (ref 135–145)

## 2024-02-18 LAB — MAGNESIUM: Magnesium: 1.7 mg/dL (ref 1.7–2.4)

## 2024-02-18 MED ORDER — HYDROXYZINE HCL 10 MG PO TABS
10.0000 mg | ORAL_TABLET | Freq: Three times a day (TID) | ORAL | Status: DC | PRN
  Administered 2024-02-18 (×2): 10 mg via ORAL
  Filled 2024-02-18 (×2): qty 1

## 2024-02-18 NOTE — Progress Notes (Signed)
 Mobility Specialist Progress Note:    02/18/24 0925  Mobility  Activity Ambulated with assistance in room  Level of Assistance Standby assist, set-up cues, supervision of patient - no hands on  Assistive Device Front wheel walker  Distance Ambulated (ft) 15 ft  Activity Response Tolerated well  Mobility Referral Yes  Mobility visit 1 Mobility  Mobility Specialist Start Time (ACUTE ONLY) [id number]  Mobility Specialist Stop Time (ACUTE ONLY) 0940  Mobility Specialist Time Calculation (min) (ACUTE ONLY) 15 min   Pt agreeable to room level mobility and transferring to the chair. Pt didn't need assistance standing or ambulating. Pt moving strongly w no c/o any symptoms. Left pt in recliner w/ all needs met and RN notified.  Venetia Keel Mobility Specialist Please Neurosurgeon or Rehab Office at [phone]

## 2024-02-18 NOTE — Progress Notes (Signed)
 PROGRESS NOTE  Joseph Tanner FMW:[id number] DOB: [birth year]   PCP: Pcp, No  Patient is from: Home  DOA: 02/10/2024 LOS: 8  Chief complaints Chief Complaint  Patient presents with   Cough   Shortness of Breath     Brief Narrative / Interim history: 73 y.o. male with medical history significant of hypertension, hyperlipidemia, combined systolic and diastolic CHF, NSTEMI, CAD s/p PCI, COPD, CKD stage IV, and cocaine abuse presents with shortness of breath.  Patient reports similar episode last November, he was hospitalized, improved and sent home.  Has not had any follow-up since.  Current symptoms started for the past week or so.  He was found to be fluid overload in the ER, admitted to the hospital and placed on diuretics.  Cardiology was consulted.  He reports he quit tobacco and alcohol for several months, last time he used cocaine was about 4 weeks ago   Subjective: Seen and examined earlier this morning.  No major events overnight or this morning.  Feels weak.  Otherwise, no other complaints.  Objective: Vitals:   02/18/24 0442 02/18/24 0443 02/18/24 0711 02/18/24 1053  BP: 126/84  103/64 107/69  Pulse: 98  91 87  Resp: (!) 21  20 19   Temp: 98.1 F (36.7 C)  97.8 F (36.6 C) 97.6 F (36.4 C)  TempSrc: Oral  Oral Oral  SpO2: 100%  100% 100%  Weight:  64.9 kg    Height:        Examination:  GENERAL: No apparent distress.  Appears frail. HEENT: MMM.  Vision and hearing grossly intact.  NECK: Supple.  No apparent JVD.  RESP:  No IWOB.  Fair aeration bilaterally. CVS:  RRR. Heart sounds normal.  ABD/GI/GU: BS+. Abd soft, NTND.  MSK/EXT:  Moves extremities. No apparent deformity. No edema.  SKIN: no apparent skin lesion or wound NEURO: AA.  Oriented appropriately.  No apparent focal neuro deficit. PSYCH: Calm. Normal affect.   Consultants:  Cardiology Palliative medicine Nephrology   Assessment and plan: Acute on chronic combined CHF -patient admitted  to the hospital with significant fluid overload.  TTE with LVEF 20-25%, global hypokinesis, RV systolic function was also severely reduced. Cardiology consulted, appreciate input, initially was on dobutamine  with improvement, however when this was weaned off he became hypotensive.  Palliative consulted for goals of care since he is end-stage CHF, and he is now DNR.  Will not reinitiate inotropes per cardiology, he is not a candidate for advanced therapies given drug use   AKI on CKD-4: Baseline cr 2.2-2.4.  Up to 3.2 on 7/16, continues to improve and has stabilized around his baseline   Urinary retention?  Seems to have resolved after starting Flomax  with -Continue Flomax -started here.   Elevated lactic acid, anion gap metabolic acidosis-with concern of poor perfusion, renal failure.  He was briefly on sodium bicarb, now discontinued.  Acidosis resolved   CAD, elevated troponin -likely demand ischemia, not in a pattern consistent with ACS.  No chest pain.  Left heart cath March 2022 with severe stenosis small Lcx and mild moderate stenosis RCA that was to be treated medically.    Hyperkalemia/hypomagnesemia/hypophosphatemia - Monitor replenish as appropriate  Chronic hyponatremia: Stable   Cocaine use -none for the past 4 weeks per patient, however UDS still positive   Anemia/thrombocytopenia-transfused 1 unit on 7/22.   - Continue monitoring  Generalized weakness/physical deconditioning: Likely due to heart failure and anemia. -PT/OT  Pneumonia ruled out.  Diarrhea-resolved   Bacteriuria: Asymptomatic.  UTI ruled out.  Disposition-he initially agreed to go home with hospice, however when hospice representative discussed with him, he now wants to go to SNF as he is feeling very weak.  Family also is unable to assist him at home.  PT reconsulted today.  Discussed with TOC as well.  Severe malnutrition Body mass index is 17.88 kg/m. Nutrition Problem: Severe Malnutrition Etiology:  social / environmental circumstances, poor appetite, acute illness Signs/Symptoms: moderate fat depletion, severe muscle depletion, energy intake < or equal to 50% for > or equal to 1 month, per patient/family report Interventions: Refer to RD note for recommendations   DVT prophylaxis:  SCDs Start: 02/10/24 1140  Code Status: DNR Family Communication: None at bedside Level of care: Progressive Status is: Inpatient Remains inpatient appropriate because: SNF   Final disposition: SNF.  Medically stable for discharge   35 minutes with more than 50% spent in reviewing records, counseling patient/family and coordinating care.   Sch Meds:  Scheduled Meds:  atorvastatin   40 mg Oral QPM   Chlorhexidine  Gluconate Cloth  6 each Topical Daily   feeding supplement  237 mL Oral TID BM   folic acid   1 mg Oral Daily   guaiFENesin   600 mg Oral BID   mexiletine  200 mg Oral Q12H   multivitamin with minerals  1 tablet Oral Daily   sodium chloride  flush  10-40 mL Intracatheter Q12H   sodium chloride  flush  3 mL Intravenous Q12H   tamsulosin   0.4 mg Oral QPC breakfast   thiamine   100 mg Oral Daily   Continuous Infusions: PRN Meds:.acetaminophen  **OR** acetaminophen , albuterol , hydrOXYzine , melatonin, ondansetron  **OR** ondansetron  (ZOFRAN ) IV, sodium chloride  flush  Antimicrobials: Anti-infectives (From admission, onward)    Start     Dose/Rate Route Frequency Ordered Stop   02/11/24 1000  azithromycin  (ZITHROMAX ) tablet 500 mg        500 mg Oral Daily 02/11/24 0805 02/12/24 0854   02/10/24 1200  cefTRIAXone  (ROCEPHIN ) 2 g in sodium chloride  0.9 % 100 mL IVPB  Status:  Discontinued        2 g 200 mL/hr over 30 Minutes Intravenous Every 24 hours 02/10/24 1145 02/12/24 1043   02/10/24 1100  cefTRIAXone  (ROCEPHIN ) 1 g in sodium chloride  0.9 % 100 mL IVPB  Status:  Discontinued        1 g 200 mL/hr over 30 Minutes Intravenous  Once 02/10/24 1052 02/10/24 1135   02/10/24 1100  azithromycin   (ZITHROMAX ) 500 mg in sodium chloride  0.9 % 250 mL IVPB        500 mg 250 mL/hr over 60 Minutes Intravenous  Once 02/10/24 1052 02/10/24 1243        I have personally reviewed the following labs and images: CBC: Recent Labs  Lab 02/13/24 0448 02/14/24 0624 02/15/24 0500 02/16/24 0215 02/17/24 0540  WBC 4.1 3.9* 3.6* 4.9 4.3  HGB 8.4* 8.2* 7.7* 7.4* 8.1*  HCT 26.2* 25.7* 24.6* 24.2* 26.0*  MCV 73.2* 73.9* 74.8* 75.9* 76.7*  PLT 91* 84* 84* 87* 82*   BMP &GFR Recent Labs  Lab 02/12/24 0240 02/13/24 0448 02/14/24 0624 02/15/24 0500 02/16/24 0215 02/17/24 0540 02/18/24 0500  NA 135 134* 134* 131* 132* 132* 133*  K 3.9 3.5 4.4 4.3 3.6 4.0 4.1  CL 105 101 97* 97* 99 100 100  CO2 18* 23 25 27 24 25 23   GLUCOSE 129* 136* 120* 114* 148* 102* 103*  BUN 40* 36* 38* 40* 41* 38* 38*  CREATININE  2.92* 2.69* 2.55* 2.49* 2.35* 2.39* 2.26*  CALCIUM  8.4* 8.3* 8.1* 7.9* 7.9* 8.0* 8.3*  MG 1.6* 1.7 2.5* 1.8 1.7 1.4* 1.7  PHOS 2.7 2.1* 2.2* 2.3* 2.2*  --   --    Estimated Creatinine Clearance: 26.7 mL/min (A) (by C-G formula based on SCr of 2.26 mg/dL (H)). Liver & Pancreas: Recent Labs  Lab 02/13/24 0448 02/14/24 0624 02/15/24 0500 02/16/24 0215 02/17/24 0540  AST 36 28 31 32 35  ALT 25 22 19 20 21   ALKPHOS 51 59 52 52 51  BILITOT 2.4* 2.1* 2.3* 2.0* 2.6*  PROT 5.6* 5.2* 5.2* 5.5* 5.5*  ALBUMIN 2.2* 2.1* 2.0* 2.1* 2.2*   No results for input(s): LIPASE, AMYLASE in the last 168 hours. No results for input(s): AMMONIA in the last 168 hours. Diabetic: No results for input(s): HGBA1C in the last 72 hours. No results for input(s): GLUCAP in the last 168 hours. Cardiac Enzymes: No results for input(s): CKTOTAL, CKMB, CKMBINDEX, TROPONINI in the last 168 hours. No results for input(s): PROBNP in the last 8760 hours. Coagulation Profile: No results for input(s): INR, PROTIME in the last 168 hours. Thyroid Function Tests: No results for input(s):  TSH, T4TOTAL, FREET4, T3FREE, THYROIDAB in the last 72 hours. Lipid Profile: No results for input(s): CHOL, HDL, LDLCALC, TRIG, CHOLHDL, LDLDIRECT in the last 72 hours. Anemia Panel: No results for input(s): VITAMINB12, FOLATE, FERRITIN, TIBC, IRON, RETICCTPCT in the last 72 hours. Urine analysis:    Component Value Date/Time   COLORURINE AMBER (A) 02/10/2024 1117   APPEARANCEUR HAZY (A) 02/10/2024 1117   APPEARANCEUR Clear 11/30/2020 1006   LABSPEC 1.014 02/10/2024 1117   PHURINE 5.0 02/10/2024 1117   GLUCOSEU NEGATIVE 02/10/2024 1117   HGBUR MODERATE (A) 02/10/2024 1117   BILIRUBINUR NEGATIVE 02/10/2024 1117   BILIRUBINUR Negative 11/30/2020 1006   KETONESUR NEGATIVE 02/10/2024 1117   PROTEINUR 30 (A) 02/10/2024 1117   NITRITE NEGATIVE 02/10/2024 1117   LEUKOCYTESUR MODERATE (A) 02/10/2024 1117   Sepsis Labs: Invalid input(s): PROCALCITONIN, LACTICIDVEN  Microbiology: Recent Results (from the past 240 hours)  Resp panel by RT-PCR (RSV, Flu A&B, Covid) Anterior Nasal Swab     Status: None   Collection Time: 02/10/24  8:51 AM   Specimen: Anterior Nasal Swab  Result Value Ref Range Status   SARS Coronavirus 2 by RT PCR NEGATIVE NEGATIVE Final   Influenza A by PCR NEGATIVE NEGATIVE Final   Influenza B by PCR NEGATIVE NEGATIVE Final    Comment: (NOTE) The Xpert Xpress SARS-CoV-2/FLU/RSV plus assay is intended as an aid in the diagnosis of influenza from Nasopharyngeal swab specimens and should not be used as a sole basis for treatment. Nasal washings and aspirates are unacceptable for Xpert Xpress SARS-CoV-2/FLU/RSV testing.  Fact Sheet for Patients: BloggerCourse.com  Fact Sheet for Healthcare Providers: SeriousBroker.it  This test is not yet approved or cleared by the [place]  FDA and has been authorized for detection and/or diagnosis of SARS-CoV-2 by FDA under an Emergency Use  Authorization (EUA). This EUA will remain in effect (meaning this test can be used) for the duration of the COVID-19 declaration under Section 564(b)(1) of the Act, 21 U.S.C. section 360bbb-3(b)(1), unless the authorization is terminated or revoked.     Resp Syncytial Virus by PCR NEGATIVE NEGATIVE Final    Comment: (NOTE) Fact Sheet for Patients: BloggerCourse.com  Fact Sheet for Healthcare Providers: SeriousBroker.it  This test is not yet approved or cleared by the [place]  FDA and has been authorized  for detection and/or diagnosis of SARS-CoV-2 by FDA under an Emergency Use Authorization (EUA). This EUA will remain in effect (meaning this test can be used) for the duration of the COVID-19 declaration under Section 564(b)(1) of the Act, 21 U.S.C. section 360bbb-3(b)(1), unless the authorization is terminated or revoked.  Performed at [facility], [address], [place], [place] [address]   Culture, blood (routine x 2)     Status: None   Collection Time: 02/10/24  9:00 AM   Specimen: BLOOD RIGHT HAND  Result Value Ref Range Status   Specimen Description BLOOD RIGHT HAND  Final   Special Requests   Final    BOTTLES DRAWN AEROBIC AND ANAEROBIC Blood Culture results may not be optimal due to an inadequate volume of blood received in culture bottles   Culture   Final    NO GROWTH 5 DAYS Performed at [facility], [address] [place], [place] [address]    Report Status 02/15/2024 FINAL  Final  Urine Culture     Status: Abnormal   Collection Time: 02/10/24 11:17 AM   Specimen: Urine, Random  Result Value Ref Range Status   Specimen Description URINE, RANDOM  Final   Special Requests   Final    NONE Reflexed from [id number] Performed at [facility], [address] [place], [place] [address]    Culture >=100,000 COLONIES/mL ENTEROCOCCUS FAECALIS (A)  Final   Report Status 02/13/2024 FINAL  Final    Organism ID, Bacteria ENTEROCOCCUS FAECALIS (A)  Final      Susceptibility   Enterococcus faecalis - MIC*    AMPICILLIN <=2 SENSITIVE Sensitive     NITROFURANTOIN <=16 SENSITIVE Sensitive     VANCOMYCIN 1 SENSITIVE Sensitive     * >=100,000 COLONIES/mL ENTEROCOCCUS FAECALIS  Culture, blood (routine x 2)     Status: None   Collection Time: 02/10/24  8:28 PM   Specimen: BLOOD RIGHT HAND  Result Value Ref Range Status   Specimen Description BLOOD RIGHT HAND  Final   Special Requests   Final    BOTTLES DRAWN AEROBIC AND ANAEROBIC Blood Culture results may not be optimal due to an inadequate volume of blood received in culture bottles   Culture   Final    NO GROWTH 5 DAYS Performed at [facility], [address], [place], [place] [address]    Report Status 02/15/2024 FINAL  Final    Radiology Studies: No results found.    Khiara Shuping T. Zahria Ding [facility]  If 7PM-7AM, please contact night-coverage [email or web address] 02/18/2024, 2:15 PM

## 2024-02-18 NOTE — Plan of Care (Signed)
   Problem: Education: Goal: Knowledge of General Education information will improve Description: Including pain rating scale, medication(s)/side effects and non-pharmacologic comfort measures Outcome: Progressing   Problem: Activity: Goal: Risk for activity intolerance will decrease Outcome: Progressing   Problem: Coping: Goal: Level of anxiety will decrease Outcome: Progressing

## 2024-02-18 NOTE — Progress Notes (Signed)
 Jolynn Pack [phone] [facility] Liaison Note:  [facility] request received to speak with patient about hospice services and to evaluate for [facility].   Patient does not meet IPU criteria at this time. He is appropriate for hospice at home or LTC. At this time, discharge disposition is undecided. Liaisons will continue to follow.  Please don't hesitate to reach out with any questions related to hospice or outpatient palliative services.   Thank you for allowing [facility] to participate in this patient's care.   Eleanor Nail, LPN [facility] Liaison [phone]

## 2024-02-18 NOTE — TOC Progression Note (Addendum)
 Transition of Care [facility]) - Progression Note    Patient Details  Name: Joseph Tanner MRN: [id number] Date of Birth: 1951-02-09  [facility] CM/SW Contact  [name], [place] Phone Number: 02/18/2024, 8:39 AM  Clinical Narrative:   CSW spoke with [name] about choice for SNF. She chose [facility] for patient. CSW notified patient and Facility. Patient is agreeable to go to [facility]. CSW to start insurance auth.   8:51 AM CSW attempted to start insurance but the following messaged appeared The current date falls outside of the patient's eligibility dates. You may only create an auth inside of these dates. CSW contacted [employer] to ask what the message meant. [employer] stated that the patient is no longer managed by them. CSW contacted [facility] to start auth at this time.   TOC will continue to follow.    Expected Discharge Plan: Skilled Nursing Facility Barriers to Discharge: Continued Medical Work up, SNF Pending bed offer, English as a second language teacher               Expected Discharge Plan and Services   Discharge Planning Services: CM Consult Post Acute Care Choice: Hospice Living arrangements for the past 2 months: Apartment                           HH Arranged: RN HH Agency:  Photographer) Date HH Agency Contacted: 02/17/24 Time HH Agency Contacted: 1002 Representative spoke with at [facility] Agency: Melissa   Social Drivers of Health (SDOH) Interventions SDOH Screenings   Food Insecurity: No Food Insecurity (02/10/2024)  Housing: Low Risk  (02/10/2024)  Transportation Needs: No Transportation Needs (02/10/2024)  Utilities: Not At Risk (02/10/2024)  Alcohol Screen: Low Risk  (06/17/2023)  Financial Resource Strain: Low Risk  (06/17/2023)  Social Connections: Socially Isolated (02/10/2024)  Tobacco Use: High Risk (02/13/2024)    Readmission Risk Interventions    02/11/2024   11:47 AM  Readmission Risk Prevention Plan  Transportation Screening  Complete  PCP or Specialist Appt within 5-7 Days Complete  Home Care Screening Complete  Medication Review (RN CM) Complete

## 2024-02-18 NOTE — Progress Notes (Signed)
 Jolynn Pack [phone] [facility] Liaison Note:   Liaisons continue to follow for discharge disposition.   Please don't hesitate to reach out with any questions related to hospice or outpatient palliative services.    Thank you for allowing [facility] to participate in this patient's care.    Eleanor Nail, LPN [facility] Liaison [phone]

## 2024-02-19 ENCOUNTER — Other Ambulatory Visit (HOSPITAL_COMMUNITY): Payer: Self-pay

## 2024-02-19 DIAGNOSIS — N179 Acute kidney failure, unspecified: Secondary | ICD-10-CM | POA: Diagnosis not present

## 2024-02-19 DIAGNOSIS — E875 Hyperkalemia: Secondary | ICD-10-CM | POA: Diagnosis not present

## 2024-02-19 DIAGNOSIS — N3000 Acute cystitis without hematuria: Secondary | ICD-10-CM | POA: Diagnosis not present

## 2024-02-19 DIAGNOSIS — E872 Acidosis, unspecified: Secondary | ICD-10-CM | POA: Diagnosis not present

## 2024-02-19 MED ORDER — LORAZEPAM 0.5 MG PO TABS
0.5000 mg | ORAL_TABLET | Freq: Every day | ORAL | Status: DC
  Administered 2024-02-19: 0.5 mg via ORAL
  Filled 2024-02-19: qty 1

## 2024-02-19 NOTE — Plan of Care (Signed)
   Problem: Education: Goal: Knowledge of General Education information will improve Description: Including pain rating scale, medication(s)/side effects and non-pharmacologic comfort measures Outcome: Progressing   Problem: Clinical Measurements: Goal: Will remain free from infection Outcome: Progressing

## 2024-02-19 NOTE — Progress Notes (Signed)
 PROGRESS NOTE  Gee Habig Holtman FMW:[id number] DOB: Jul 07, 1951   PCP: Pcp, No  Patient is from: Home  DOA: 02/10/2024 LOS: 9  Chief complaints Chief Complaint  Patient presents with   Cough   Shortness of Breath     Brief Narrative / Interim history: 73 y.o. male with medical history significant of hypertension, hyperlipidemia, combined systolic and diastolic CHF, NSTEMI, CAD s/p PCI, COPD, CKD stage IV, and cocaine abuse presents with shortness of breath.  Patient reports similar episode last November, he was hospitalized, improved and sent home.  Has not had any follow-up since.  Current symptoms started for the past week or so.  He was found to be fluid overload in the ER, admitted to the hospital and placed on diuretics.  Cardiology was consulted.  He reports he quit tobacco and alcohol for several months, last time he used cocaine was about 4 weeks ago   Subjective: Seen and examined earlier this morning.  No major events overnight or this morning.  Reports difficulty falling asleep last night despite taking melatonin.  No other complaints.  Denies pain or shortness of breath.  Objective: Vitals:   02/18/24 2324 02/19/24 0528 02/19/24 0751 02/19/24 1116  BP: 113/73 125/78 109/68 104/69  Pulse:  94 92 89  Resp: 17 20 17 18   Temp: 98.4 F (36.9 C) 98.2 F (36.8 C) 98.2 F (36.8 C) 97.8 F (36.6 C)  TempSrc: Oral Oral Oral Oral  SpO2: 98% 100% 100% 100%  Weight:  64.1 kg    Height:        Examination:  GENERAL: No apparent distress.  Appears frail. HEENT: MMM.  Vision and hearing grossly intact.  NECK: Supple.  No apparent JVD.  RESP:  No IWOB.  Fair aeration bilaterally. CVS:  RRR. Heart sounds normal.  ABD/GI/GU: BS+. Abd soft, NTND.  MSK/EXT:  Moves extremities. No apparent deformity. No edema.  SKIN: no apparent skin lesion or wound NEURO: Sleepy but wakes to voice.  Oriented appropriately.  No apparent focal neuro deficit. PSYCH: Calm. Normal affect.    Consultants:  Cardiology Palliative medicine Nephrology   Assessment and plan: Acute on chronic combined CHF -patient admitted to the hospital with significant fluid overload.  TTE with LVEF 20-25%, global hypokinesis, RV systolic function was also severely reduced. Cardiology consulted, appreciate input, initially was on dobutamine  with improvement, however when this was weaned off he became hypotensive.  Palliative consulted for goals of care since he is end-stage CHF, and he is now DNR.  Will not reinitiate inotropes per cardiology, he is not a candidate for advanced therapies given drug use   AKI on CKD-4: Baseline cr 2.2-2.4.  Up to 3.2 on 7/16, continues to improve and has stabilized around his baseline   Urinary retention?  Seems to have resolved after starting Flomax  with -Continue Flomax -started here.   Elevated lactic acid, anion gap metabolic acidosis-with concern of poor perfusion, renal failure.  He was briefly on sodium bicarb, now discontinued.  Acidosis resolved   CAD, elevated troponin -likely demand ischemia, not in a pattern consistent with ACS.  No chest pain.  Left heart cath March 2022 with severe stenosis small Lcx and mild moderate stenosis RCA that was to be treated medically.    Hyperkalemia/hypomagnesemia/hypophosphatemia - Monitor replenish as appropriate  Chronic hyponatremia: Stable   Cocaine use -none for the past 4 weeks per patient, however UDS still positive   Anemia/thrombocytopenia-transfused 1 unit on 7/22.   - Continue monitoring  Generalized weakness/physical  deconditioning: Likely due to heart failure and anemia. -PT/OT recommended SNF.  TOC working on this.  Anxiety/insomnia - Continue Atarax  and melatonin - Add p.o. Ativan  at night to help with sleep.  Pneumonia ruled out.  Diarrhea-resolved   Bacteriuria: Asymptomatic.  UTI ruled out.  Disposition-he initially agreed to go home with hospice, however when hospice representative  discussed with him, he now wants to go to SNF as he is feeling very weak.  Family also is unable to assist him at home.  PT reconsulted today.  Discussed with TOC as well.  Severe malnutrition Body mass index is 17.67 kg/m. Nutrition Problem: Severe Malnutrition Etiology: social / environmental circumstances, poor appetite, acute illness Signs/Symptoms: moderate fat depletion, severe muscle depletion, energy intake < or equal to 50% for > or equal to 1 month, per patient/family report Interventions: Refer to RD note for recommendations   DVT prophylaxis:  SCDs Start: 02/10/24 1140  Code Status: DNR Family Communication: None at bedside Level of care: Progressive Status is: Inpatient Remains inpatient appropriate because: SNF   Final disposition: SNF.  Medically stable for discharge   35 minutes with more than 50% spent in reviewing records, counseling patient/family and coordinating care.   Sch Meds:  Scheduled Meds:  atorvastatin   40 mg Oral QPM   Chlorhexidine  Gluconate Cloth  6 each Topical Daily   feeding supplement  237 mL Oral TID BM   folic acid   1 mg Oral Daily   guaiFENesin   600 mg Oral BID   mexiletine  200 mg Oral Q12H   multivitamin with minerals  1 tablet Oral Daily   sodium chloride  flush  10-40 mL Intracatheter Q12H   sodium chloride  flush  3 mL Intravenous Q12H   tamsulosin   0.4 mg Oral QPC breakfast   thiamine   100 mg Oral Daily   Continuous Infusions: PRN Meds:.acetaminophen  **OR** acetaminophen , albuterol , hydrOXYzine , melatonin, ondansetron  **OR** ondansetron  (ZOFRAN ) IV, sodium chloride  flush  Antimicrobials: Anti-infectives (From admission, onward)    Start     Dose/Rate Route Frequency Ordered Stop   02/11/24 1000  azithromycin  (ZITHROMAX ) tablet 500 mg        500 mg Oral Daily 02/11/24 0805 02/12/24 0854   02/10/24 1200  cefTRIAXone  (ROCEPHIN ) 2 g in sodium chloride  0.9 % 100 mL IVPB  Status:  Discontinued        2 g 200 mL/hr over 30 Minutes  Intravenous Every 24 hours 02/10/24 1145 02/12/24 1043   02/10/24 1100  cefTRIAXone  (ROCEPHIN ) 1 g in sodium chloride  0.9 % 100 mL IVPB  Status:  Discontinued        1 g 200 mL/hr over 30 Minutes Intravenous  Once 02/10/24 1052 02/10/24 1135   02/10/24 1100  azithromycin  (ZITHROMAX ) 500 mg in sodium chloride  0.9 % 250 mL IVPB        500 mg 250 mL/hr over 60 Minutes Intravenous  Once 02/10/24 1052 02/10/24 1243        I have personally reviewed the following labs and images: CBC: Recent Labs  Lab 02/13/24 0448 02/14/24 0624 02/15/24 0500 02/16/24 0215 02/17/24 0540  WBC 4.1 3.9* 3.6* 4.9 4.3  HGB 8.4* 8.2* 7.7* 7.4* 8.1*  HCT 26.2* 25.7* 24.6* 24.2* 26.0*  MCV 73.2* 73.9* 74.8* 75.9* 76.7*  PLT 91* 84* 84* 87* 82*   BMP &GFR Recent Labs  Lab 02/13/24 0448 02/14/24 0624 02/15/24 0500 02/16/24 0215 02/17/24 0540 02/18/24 0500  NA 134* 134* 131* 132* 132* 133*  K 3.5 4.4 4.3 3.6  4.0 4.1  CL 101 97* 97* 99 100 100  CO2 23 25 27 24 25 23   GLUCOSE 136* 120* 114* 148* 102* 103*  BUN 36* 38* 40* 41* 38* 38*  CREATININE 2.69* 2.55* 2.49* 2.35* 2.39* 2.26*  CALCIUM  8.3* 8.1* 7.9* 7.9* 8.0* 8.3*  MG 1.7 2.5* 1.8 1.7 1.4* 1.7  PHOS 2.1* 2.2* 2.3* 2.2*  --   --    Estimated Creatinine Clearance: 26.4 mL/min (A) (by C-G formula based on SCr of 2.26 mg/dL (H)). Liver & Pancreas: Recent Labs  Lab 02/13/24 0448 02/14/24 0624 02/15/24 0500 02/16/24 0215 02/17/24 0540  AST 36 28 31 32 35  ALT 25 22 19 20 21   ALKPHOS 51 59 52 52 51  BILITOT 2.4* 2.1* 2.3* 2.0* 2.6*  PROT 5.6* 5.2* 5.2* 5.5* 5.5*  ALBUMIN 2.2* 2.1* 2.0* 2.1* 2.2*   No results for input(s): LIPASE, AMYLASE in the last 168 hours. No results for input(s): AMMONIA in the last 168 hours. Diabetic: No results for input(s): HGBA1C in the last 72 hours. No results for input(s): GLUCAP in the last 168 hours. Cardiac Enzymes: No results for input(s): CKTOTAL, CKMB, CKMBINDEX, TROPONINI in the  last 168 hours. No results for input(s): PROBNP in the last 8760 hours. Coagulation Profile: No results for input(s): INR, PROTIME in the last 168 hours. Thyroid Function Tests: No results for input(s): TSH, T4TOTAL, FREET4, T3FREE, THYROIDAB in the last 72 hours. Lipid Profile: No results for input(s): CHOL, HDL, LDLCALC, TRIG, CHOLHDL, LDLDIRECT in the last 72 hours. Anemia Panel: No results for input(s): VITAMINB12, FOLATE, FERRITIN, TIBC, IRON, RETICCTPCT in the last 72 hours. Urine analysis:    Component Value Date/Time   COLORURINE AMBER (A) 02/10/2024 1117   APPEARANCEUR HAZY (A) 02/10/2024 1117   APPEARANCEUR Clear 11/30/2020 1006   LABSPEC 1.014 02/10/2024 1117   PHURINE 5.0 02/10/2024 1117   GLUCOSEU NEGATIVE 02/10/2024 1117   HGBUR MODERATE (A) 02/10/2024 1117   BILIRUBINUR NEGATIVE 02/10/2024 1117   BILIRUBINUR Negative 11/30/2020 1006   KETONESUR NEGATIVE 02/10/2024 1117   PROTEINUR 30 (A) 02/10/2024 1117   NITRITE NEGATIVE 02/10/2024 1117   LEUKOCYTESUR MODERATE (A) 02/10/2024 1117   Sepsis Labs: Invalid input(s): PROCALCITONIN, LACTICIDVEN  Microbiology: Recent Results (from the past 240 hours)  Resp panel by RT-PCR (RSV, Flu A&B, Covid) Anterior Nasal Swab     Status: None   Collection Time: 02/10/24  8:51 AM   Specimen: Anterior Nasal Swab  Result Value Ref Range Status   SARS Coronavirus 2 by RT PCR NEGATIVE NEGATIVE Final   Influenza A by PCR NEGATIVE NEGATIVE Final   Influenza B by PCR NEGATIVE NEGATIVE Final    Comment: (NOTE) The Xpert Xpress SARS-CoV-2/FLU/RSV plus assay is intended as an aid in the diagnosis of influenza from Nasopharyngeal swab specimens and should not be used as a sole basis for treatment. Nasal washings and aspirates are unacceptable for Xpert Xpress SARS-CoV-2/FLU/RSV testing.  Fact Sheet for Patients: BloggerCourse.com  Fact Sheet for Healthcare  Providers: SeriousBroker.it  This test is not yet approved or cleared by the [place]  FDA and has been authorized for detection and/or diagnosis of SARS-CoV-2 by FDA under an Emergency Use Authorization (EUA). This EUA will remain in effect (meaning this test can be used) for the duration of the COVID-19 declaration under Section 564(b)(1) of the Act, 21 U.S.C. section 360bbb-3(b)(1), unless the authorization is terminated or revoked.     Resp Syncytial Virus by PCR NEGATIVE NEGATIVE Final  Comment: (NOTE) Fact Sheet for Patients: BloggerCourse.com  Fact Sheet for Healthcare Providers: SeriousBroker.it  This test is not yet approved or cleared by the [place]  FDA and has been authorized for detection and/or diagnosis of SARS-CoV-2 by FDA under an Emergency Use Authorization (EUA). This EUA will remain in effect (meaning this test can be used) for the duration of the COVID-19 declaration under Section 564(b)(1) of the Act, 21 U.S.C. section 360bbb-3(b)(1), unless the authorization is terminated or revoked.  Performed at [facility], [address], [place], [place] [address]   Culture, blood (routine x 2)     Status: None   Collection Time: 02/10/24  9:00 AM   Specimen: BLOOD RIGHT HAND  Result Value Ref Range Status   Specimen Description BLOOD RIGHT HAND  Final   Special Requests   Final    BOTTLES DRAWN AEROBIC AND ANAEROBIC Blood Culture results may not be optimal due to an inadequate volume of blood received in culture bottles   Culture   Final    NO GROWTH 5 DAYS Performed at [facility], [address], [place], [place] [address]    Report Status 02/15/2024 FINAL  Final  Urine Culture     Status: Abnormal   Collection Time: 02/10/24 11:17 AM   Specimen: Urine, Random  Result Value Ref Range Status   Specimen Description URINE, RANDOM  Final   Special Requests    Final    NONE Reflexed from [id number] Performed at [facility], [address] [place], [place] [address]    Culture >=100,000 COLONIES/mL ENTEROCOCCUS FAECALIS (A)  Final   Report Status 02/13/2024 FINAL  Final   Organism ID, Bacteria ENTEROCOCCUS FAECALIS (A)  Final      Susceptibility   Enterococcus faecalis - MIC*    AMPICILLIN <=2 SENSITIVE Sensitive     NITROFURANTOIN <=16 SENSITIVE Sensitive     VANCOMYCIN 1 SENSITIVE Sensitive     * >=100,000 COLONIES/mL ENTEROCOCCUS FAECALIS  Culture, blood (routine x 2)     Status: None   Collection Time: 02/10/24  8:28 PM   Specimen: BLOOD RIGHT HAND  Result Value Ref Range Status   Specimen Description BLOOD RIGHT HAND  Final   Special Requests   Final    BOTTLES DRAWN AEROBIC AND ANAEROBIC Blood Culture results may not be optimal due to an inadequate volume of blood received in culture bottles   Culture   Final    NO GROWTH 5 DAYS Performed at [facility], [address] [place], [place] [address]    Report Status 02/15/2024 FINAL  Final    Radiology Studies: No results found.    Jhayden Demuro T. Thetis Schwimmer [facility]  If 7PM-7AM, please contact night-coverage [email or web address] 02/19/2024, 11:57 AM

## 2024-02-19 NOTE — Progress Notes (Signed)
 Joseph Tanner [phone] [facility] Liaison Note:   Liaisons continue to follow for discharge disposition.   Please don't hesitate to reach out with any questions related to hospice or outpatient palliative services.    Thank you for allowing [facility] to participate in this patient's care.    Eleanor Nail, LPN [facility] Liaison [phone]

## 2024-02-19 NOTE — Plan of Care (Signed)
   Problem: Education: Goal: Knowledge of General Education information will improve Description Including pain rating scale, medication(s)/side effects and non-pharmacologic comfort measures Outcome: Progressing

## 2024-02-19 NOTE — Progress Notes (Signed)
 Mobility Specialist Progress Note:    02/19/24 1012  Mobility  Activity Ambulated with assistance in room  Level of Assistance Contact guard assist, steadying assist  Assistive Device Front wheel walker  Distance Ambulated (ft) 50 ft  Activity Response Tolerated well  Mobility Referral Yes  Mobility visit 1 Mobility  Mobility Specialist Start Time (ACUTE ONLY) 1012  Mobility Specialist Stop Time (ACUTE ONLY) 1026  Mobility Specialist Time Calculation (min) (ACUTE ONLY) 14 min   Pt agreeable to session. No c/o any symptoms other than being tired from lack of sleep. Pt seeming stronger and gait looked more regular. Pt distance increasing in ambulation. Left pt in bed in w/ all needs met.   Venetia Keel Mobility Specialist Please Neurosurgeon or Rehab Office at [phone]

## 2024-02-20 DIAGNOSIS — J189 Pneumonia, unspecified organism: Secondary | ICD-10-CM | POA: Diagnosis not present

## 2024-02-20 DIAGNOSIS — N3000 Acute cystitis without hematuria: Secondary | ICD-10-CM | POA: Diagnosis not present

## 2024-02-20 DIAGNOSIS — I5042 Chronic combined systolic (congestive) and diastolic (congestive) heart failure: Secondary | ICD-10-CM | POA: Diagnosis not present

## 2024-02-20 DIAGNOSIS — R570 Cardiogenic shock: Secondary | ICD-10-CM | POA: Diagnosis not present

## 2024-02-20 MED ORDER — HYDROXYZINE HCL 10 MG PO TABS: 10.0000 mg | ORAL_TABLET | Freq: Three times a day (TID) | ORAL | Status: DC | PRN

## 2024-02-20 MED ORDER — FOLIC ACID 1 MG PO TABS: 1.0000 mg | ORAL_TABLET | Freq: Every day | ORAL | Status: DC

## 2024-02-20 MED ORDER — VITAMIN B-1 100 MG PO TABS: 100.0000 mg | ORAL_TABLET | Freq: Every day | ORAL | Status: DC

## 2024-02-20 MED ORDER — MELATONIN 5 MG PO TABS: 5.0000 mg | ORAL_TABLET | Freq: Every evening | ORAL | Status: DC | PRN

## 2024-02-20 MED ORDER — ENSURE PLUS HIGH PROTEIN PO LIQD: 237.0000 mL | Freq: Three times a day (TID) | ORAL | Status: DC

## 2024-02-20 MED ORDER — ACETAMINOPHEN 325 MG PO TABS: 650.0000 mg | ORAL_TABLET | Freq: Four times a day (QID) | ORAL | Status: DC | PRN

## 2024-02-20 MED ORDER — GUAIFENESIN ER 600 MG PO TB12: 600.0000 mg | ORAL_TABLET | Freq: Two times a day (BID) | ORAL | Status: DC

## 2024-02-20 NOTE — Progress Notes (Signed)
 Joseph Tanner [phone] Liaison Note:  Notified by [facility] manager of patient/family request for [facility] Palliative services in facility after discharge.   Please call with any hospice or outpatient palliative care related questions.   Thank you for the opportunity to participate in this patient's care.   Elouise Husband, BSN, RN, OCN [employer] [phone]

## 2024-02-20 NOTE — Progress Notes (Signed)
 Report called to Jeoffrey Schneider, LPN at [facility]

## 2024-02-20 NOTE — Plan of Care (Signed)
   Problem: Education: Goal: Knowledge of General Education information will improve Description Including pain rating scale, medication(s)/side effects and non-pharmacologic comfort measures Outcome: Progressing   Problem: Health Behavior/Discharge Planning: Goal: Ability to manage health-related needs will improve Outcome: Progressing

## 2024-02-20 NOTE — Plan of Care (Signed)
 ?  Problem: Clinical Measurements: ?Goal: Will remain free from infection ?Outcome: Progressing ?  ?

## 2024-02-20 NOTE — TOC Progression Note (Signed)
 Transition of Care [facility]) - Progression Note    Patient Details  Name: Joseph Tanner MRN: [id number] Date of Birth: 14-Sep-1950  Transition of Care [facility]) CM/SW Contact  [name], [place] Phone Number: 02/20/2024, 8:50 AM  Clinical Narrative:  [facility] informed CSW that shara has been approved; awaiting to hear back about bed availability.   TOC will continue to follow.    Expected Discharge Plan: Skilled Nursing Facility Barriers to Discharge: Continued Medical Work up, SNF Pending bed offer, English as a second language teacher               Expected Discharge Plan and Services   Discharge Planning Services: CM Consult Post Acute Care Choice: Hospice Living arrangements for the past 2 months: Apartment                           HH Arranged: RN HH Agency:  Photographer) Date HH Agency Contacted: 02/17/24 Time HH Agency Contacted: 1002 Representative spoke with at [facility] Agency: Melissa   Social Drivers of Health (SDOH) Interventions SDOH Screenings   Food Insecurity: No Food Insecurity (02/10/2024)  Housing: Low Risk  (02/10/2024)  Transportation Needs: No Transportation Needs (02/10/2024)  Utilities: Not At Risk (02/10/2024)  Alcohol Screen: Low Risk  (06/17/2023)  Financial Resource Strain: Low Risk  (06/17/2023)  Social Connections: Socially Isolated (02/10/2024)  Tobacco Use: High Risk (02/13/2024)    Readmission Risk Interventions    02/11/2024   11:47 AM  Readmission Risk Prevention Plan  Transportation Screening Complete  PCP or Specialist Appt within 5-7 Days Complete  Home Care Screening Complete  Medication Review (RN CM) Complete

## 2024-02-20 NOTE — TOC Transition Note (Addendum)
 Transition of Care [facility]) - Discharge Note   Patient Details  Name: Joseph Tanner MRN: [id number] Date of Birth: August 06, 1950  Transition of Care [facility]) CM/SW Contact:  [name], LCSWA Phone Number: 02/20/2024, 11:12 AM   Clinical Narrative:   Patient will DC to: [facility] Anticipated DC date: 02/20/24  Family notified: [name] (wife) Transport by: [place]   Per MD patient ready for [facility] to [facility]. RN to call report prior to discharge [phone]; room 106). RN, patient, patient's family, [facility] and facility notified of DC. Discharge Summary and FL2 sent to facility. DC packet on chart. Ambulance transport requested for patient 11:13 AM.   11:49 AM Per bedside RN, pts PICC needs to be removed. Transport needing to be pushed back. CSW called [facility] and rearranged transport for around 3PM. CSW notified DC lounge and bedside RN.   CSW will sign off for now as social work intervention is no longer needed. Please consult us  again if new needs arise.   Final next level of care: Skilled Nursing Facility Barriers to Discharge: Barriers Resolved   Patient Goals and CMS Choice Patient states their goals for this hospitalization and ongoing recovery are:: wants to get better CMS Medicare.gov Compare Post Acute Care list provided to:: Patient Choice offered to / list presented to : Patient      Discharge Placement              Patient chooses bed at: [facility] Patient to be transferred to facility by: [facility] Name of family member notified: [name] (wife) Patient and family notified of of transfer: 02/20/24  Discharge Plan and Services Additional resources added to the After Visit Summary for     Discharge Planning Services: CM Consult Post Acute Care Choice: Hospice                    HH Arranged: RN [facility] Agency:  Photographer) Date HH Agency Contacted: 02/17/24 Time HH Agency Contacted: 1002 Representative spoke with at  [facility] Agency: Melissa  Social Drivers of Health (SDOH) Interventions SDOH Screenings   Food Insecurity: No Food Insecurity (02/10/2024)  Housing: Low Risk  (02/10/2024)  Transportation Needs: No Transportation Needs (02/10/2024)  Utilities: Not At Risk (02/10/2024)  Alcohol Screen: Low Risk  (06/17/2023)  Financial Resource Strain: Low Risk  (06/17/2023)  Social Connections: Socially Isolated (02/10/2024)  Tobacco Use: High Risk (02/13/2024)     Readmission Risk Interventions    02/11/2024   11:47 AM  Readmission Risk Prevention Plan  Transportation Screening Complete  PCP or Specialist Appt within 5-7 Days Complete  Home Care Screening Complete  Medication Review (RN CM) Complete

## 2024-02-20 NOTE — Progress Notes (Signed)
 PT Cancellation Note  Patient Details Name: [name] MRN: [id number] DOB: 1950-10-18   Cancelled Treatment:    Reason Eval/Treat Not Completed: (P) Fatigue/lethargy limiting ability to participate (pt requesting to sleep, reports fatigue after getting up with mobility recently.) Will continue efforts per PT plan of care as schedule permits.   Eugena Rhue M Zyriah Mask 02/20/2024, 10:09 AM

## 2024-02-20 NOTE — Progress Notes (Signed)
 Mobility Specialist Progress Note:   02/20/24 0910  Mobility  Activity Ambulated with assistance in hallway;Ambulated with assistance in room  Level of Assistance Contact guard assist, steadying assist  Assistive Device Front wheel walker  Distance Ambulated (ft) 50 ft  Activity Response Tolerated well  Mobility Referral Yes  Mobility visit 1 Mobility  Mobility Specialist Start Time (ACUTE ONLY) 0910  Mobility Specialist Stop Time (ACUTE ONLY) 0925  Mobility Specialist Time Calculation (min) (ACUTE ONLY) 15 min   Pt agreeable to mobility session. Required contact assist for safety throughout ambulation with RW. No overt LOB noted. Pt back in bed with all needs met.   Therisa Rana Mobility Specialist Please contact via SecureChat or  Rehab office at [phone]

## 2024-02-20 NOTE — Discharge Summary (Signed)
 Physician Discharge Summary  Joseph Tanner FMW:[id number] DOB: 27-Jun-1951 DOA: 02/10/2024  PCP: Pcp, No  Admit date: 02/10/2024 Discharge date: 02/20/24  Admitted From: Home Disposition: SNF Recommendations for Outpatient Follow-up:  Check CMP and CBC in 1 to 2 weeks Continue goal of care discussion Please follow up on the following pending results: None   Discharge Condition: Stable but guarded prognosis CODE STATUS: DNR Diet Orders (From admission, onward)     Start     Ordered   02/12/24 1502  Diet 2 gram sodium Room service appropriate? Yes with Assist; Fluid consistency: Thin  Diet effective now       Question Answer Comment  Room service appropriate? Yes with Assist   Fluid consistency: Thin      02/12/24 1502             Contact information for follow-up providers     Joseph Philippe SAUNDERS, NP Follow up on 03/23/2024.   Specialty: Family Medicine Why: You are scheduled for a hospital follow up on Tuesday, March 23, 2024 at 10:20 am. Contact information: [address] Joseph Tanner [place] [address] [phone]         [facility] Follow up on 02/26/2024.   Specialty: Cardiology Why: Advanced Heart Failure 1:30 PM Entrance C, Free Valet Parking Contact information: [address] [place] Joseph Tanner  [phone]             Contact information for after-discharge care     Destination     [facility] .   Service: Skilled Nursing Contact information: [address] [place]  [address] [phone]                     Hospital course 73 y.o. male with medical history significant of hypertension, hyperlipidemia, combined systolic and diastolic CHF, NSTEMI, CAD s/p PCI, COPD, CKD stage IV, and cocaine abuse presents with shortness of breath.  Patient reports similar episode last November, he was hospitalized, improved and sent home.  Has not  had any follow-up since.  Current symptoms started for the past week or so.  He was found to be fluid overload in the ER, admitted to the hospital and placed on diuretics.  Cardiology was consulted.   TTE with LVEF 20-25%, global hypokinesis, RV systolic function was also severely reduced. Cardiology consulted,  initially was on dobutamine  with improvement, however when this was weaned off he became hypotensive.  He was deemed to have end-stage CHF and cardiology did not feel reinitiating inotropes.  Also deemed to be not a candidate for advanced therapies given drug use history.  Palliative consulted for goals of care.  CODE STATUS changed to DNR.  Initially agreed to go home with hospice.  However, he changed his mind after he met with hospice representative and decided to go to SNF for rehabilitation.   See individual problem list below for more.   Problems addressed during this hospitalization Acute on chronic combined CHF -patient admitted to the hospital with significant fluid overload.  TTE with LVEF 20-25%, global hypokinesis, RV systolic function was also severely reduced. Cardiology consulted, appreciate input, initially was on dobutamine  with improvement, however when this was weaned off he became hypotensive.  Palliative consulted for goals of care since he is end-stage CHF, and he is now DNR.  Will not reinitiate inotropes per cardiology, he is not a candidate for advanced therapies given drug use.  Continue goals of care  discussion outpatient.   AKI on CKD-4: Baseline cr 2.2-2.4.  Up to 3.2 on 7/16, continues to improve and has stabilized around his baseline   Urinary retention?  Seems to have resolved after starting Flomax  with -Continue Flomax -started here.   Elevated lactic acid, anion gap metabolic acidosis-with concern of poor perfusion, renal failure.  He was briefly on sodium bicarb, now discontinued.  Acidosis resolved   CAD, elevated troponin -likely demand ischemia, not in a  pattern consistent with ACS.  No chest pain.  Left heart cath March 2022 with severe stenosis small Lcx and mild moderate stenosis RCA that was to be treated medically.    Hyperkalemia/hypomagnesemia/hypophosphatemia - Monitor replenish as appropriate   Chronic hyponatremia: Stable   Cocaine use -none for the past 4 weeks per patient, however UDS still positive   Anemia/thrombocytopenia-transfused 1 unit on 7/22.   - Continue monitoring   Generalized weakness/physical deconditioning: Likely due to heart failure and anemia. -PT/OT recommended SNF.    Anxiety/insomnia - Continue Atarax  and melatonin   Pneumonia ruled out.   Diarrhea-resolved   Bacteriuria: Asymptomatic.  UTI ruled out.   Disposition-he initially agreed to go home with hospice, however when hospice representative discussed with him, he now wants to go to SNF as he is feeling very weak.  Family also is unable to assist him at home.  PT reconsulted today.  Discussed with TOC as well.   Severe malnutrition Body mass index is 17.53 kg/m. Nutrition Problem: Severe Malnutrition Etiology: social / environmental circumstances, poor appetite, acute illness Signs/Symptoms: moderate fat depletion, severe muscle depletion, energy intake < or equal to 50% for > or equal to 1 month, per patient/family report Interventions: Refer to RD note for recommendations     Consultations: Cardiology Palliative care Nephrology  Time spent 35  minutes  Vital signs Vitals:   02/19/24 1955 02/19/24 2333 02/20/24 0613 02/20/24 0745  BP: 106/65 113/72 104/70 (P) 113/76  Pulse: 94 96 87   Temp: 98.3 F (36.8 C) 97.9 F (36.6 C) 97.6 F (36.4 C) (P) 98.2 F (36.8 C)  Resp: (!) 21 16 (!) 22   Height:      Weight:   63.6 kg   SpO2: 100% 100% 100%   TempSrc: Oral Oral Oral (P) Oral  BMI (Calculated):   17.53      Discharge exam  GENERAL: No apparent distress.  Appears frail. HEENT: MMM.  Vision and hearing grossly intact.   NECK: Supple.  No apparent JVD.  RESP:  No IWOB.  Fair aeration bilaterally. CVS:  RRR. Heart sounds normal.  ABD/GI/GU: BS+. Abd soft, NTND.  MSK/EXT:  Moves extremities. No apparent deformity. No edema.  SKIN: no apparent skin lesion or wound NEURO: Awake and alert. Oriented appropriately.  No apparent focal neuro deficit. PSYCH: Calm. Normal affect.   Discharge Instructions  Allergies as of 02/20/2024   No Known Allergies      Medication List     STOP taking these medications    carvedilol  3.125 MG tablet Commonly known as: COREG    ibuprofen 800 MG tablet Commonly known as: ADVIL   isosorbide-hydrALAZINE  20-37.5 MG tablet Commonly known as: BIDIL   sacubitril -valsartan  24-26 MG Commonly known as: ENTRESTO    spironolactone  25 MG tablet Commonly known as: ALDACTONE        TAKE these medications    acetaminophen  325 MG tablet Commonly known as: TYLENOL  Take 2 tablets (650 mg total) by mouth every 6 (six) hours as needed for mild pain (pain  score 1-3) or fever (or Fever >/= 101).   albuterol  108 (90 Base) MCG/ACT inhaler Commonly known as: VENTOLIN  HFA Inhale 2 puffs into the lungs every 6 (six) hours as needed for wheezing or shortness of breath.   atorvastatin  40 MG tablet Commonly known as: LIPITOR Take 1 tablet (40 mg total) by mouth every evening.   feeding supplement Liqd Take 237 mLs by mouth 3 (three) times daily between meals.   folic acid  1 MG tablet Commonly known as: FOLVITE  Take 1 tablet (1 mg total) by mouth daily. Start taking on: February 21, 2024   guaiFENesin  600 MG 12 hr tablet Commonly known as: MUCINEX  Take 1 tablet (600 mg total) by mouth 2 (two) times daily.   hydrOXYzine  10 MG tablet Commonly known as: ATARAX  Take 1 tablet (10 mg total) by mouth 3 (three) times daily as needed for anxiety.   melatonin 5 MG Tabs Take 1 tablet (5 mg total) by mouth at bedtime as needed (insomnia).   mexiletine 200 MG capsule Commonly known as:  MEXITIL  Take 1 capsule (200 mg total) by mouth every 12 (twelve) hours.   multivitamin with minerals Tabs tablet Take 1 tablet by mouth daily.   tamsulosin  0.4 MG Caps capsule Commonly known as: FLOMAX  Take 1 capsule (0.4 mg total) by mouth daily after breakfast.   thiamine  100 MG tablet Commonly known as: Vitamin B-1 Take 1 tablet (100 mg total) by mouth daily. Start taking on: February 21, 2024   torsemide  20 MG tablet Commonly known as: DEMADEX  Take 2 tablets (40 mg total) by mouth daily as needed (fluid / edema).         Procedures/Studies:   US  EKG SITE RITE Result Date: 02/11/2024 If Site Rite image not attached, placement could not be confirmed due to current cardiac rhythm.  ECHOCARDIOGRAM COMPLETE Result Date: 02/11/2024    ECHOCARDIOGRAM REPORT   Patient Name:   Serigne DELAINE [facility] Date of Exam: 02/11/2024 Medical Rec #:  [id number]               Height:       75.0 in Accession #:    [id number]              Weight:       152.1 lb Date of Birth:  29-Jul-1951                BSA:          1.953 m Patient Age:    73 years                BP:           116/79 mmHg Patient Gender: M                       HR:           85 bpm. Exam Location:  Inpatient Procedure: 2D Echo, Cardiac Doppler and Color Doppler (Both Spectral and Color            Flow Doppler were utilized during procedure). Indications:    I50.40* Unspecified combined systolic (congestive) and diastolic                 (congestive) heart failure  History:        Patient has prior history of Echocardiogram examinations, most                 recent 06/15/2023. CHF; CAD and Previous Myocardial  Infarction.                 Cocaine.  Sonographer:    Ellouise Mose RDCS Referring Phys: [id number] RONDELL A SMITH IMPRESSIONS  1. Left ventricular ejection fraction, by estimation, is 20 to 25%. The left ventricle has severely decreased function. The left ventricle demonstrates global hypokinesis. The left ventricular internal cavity size was  severely dilated. There is mild concentric left ventricular hypertrophy. Left ventricular diastolic parameters are consistent with Grade I diastolic dysfunction (impaired relaxation).  2. Right ventricular systolic function is severely reduced. The right ventricular size is moderately enlarged. There is moderately elevated pulmonary artery systolic pressure.  3. Left atrial size was moderately dilated.  4. Right atrial size was severely dilated.  5. Large pleural effusion in the left lateral region.  6. The mitral valve is normal in structure. Trivial mitral valve regurgitation. No evidence of mitral stenosis.  7. The aortic valve is tricuspid. Aortic valve regurgitation is not visualized. No aortic stenosis is present.  8. Aortic dilatation noted. There is mild dilatation of the ascending aorta, measuring 41 mm.  9. The inferior vena cava is dilated in size with <50% respiratory variability, suggesting right atrial pressure of 15 mmHg. Conclusion(s)/Recommendation(s): No left ventricular mural or apical thrombus/thrombi. FINDINGS  Left Ventricle: Left ventricular ejection fraction, by estimation, is 20 to 25%. The left ventricle has severely decreased function. The left ventricle demonstrates global hypokinesis. The left ventricular internal cavity size was severely dilated. There is mild concentric left ventricular hypertrophy. Left ventricular diastolic parameters are consistent with Grade I diastolic dysfunction (impaired relaxation). Right Ventricle: The right ventricular size is moderately enlarged. No increase in right ventricular wall thickness. Right ventricular systolic function is severely reduced. There is moderately elevated pulmonary artery systolic pressure. The tricuspid regurgitant velocity is 2.78 m/s, and with an assumed right atrial pressure of 15 mmHg, the estimated right ventricular systolic pressure is 45.9 mmHg. Left Atrium: Left atrial size was moderately dilated. Right Atrium: Right atrial  size was severely dilated. Pericardium: There is no evidence of pericardial effusion. Mitral Valve: The mitral valve is normal in structure. Trivial mitral valve regurgitation. No evidence of mitral valve stenosis. Tricuspid Valve: The tricuspid valve is normal in structure. Tricuspid valve regurgitation is mild . No evidence of tricuspid stenosis. Aortic Valve: The aortic valve is tricuspid. Aortic valve regurgitation is not visualized. No aortic stenosis is present. Pulmonic Valve: The pulmonic valve was normal in structure. Pulmonic valve regurgitation is mild. No evidence of pulmonic stenosis. Aorta: Aortic dilatation noted. There is mild dilatation of the ascending aorta, measuring 41 mm. Venous: The inferior vena cava is dilated in size with less than 50% respiratory variability, suggesting right atrial pressure of 15 mmHg. IAS/Shunts: No atrial level shunt detected by color flow Doppler. Additional Comments: There is a large pleural effusion in the left lateral region.  LEFT VENTRICLE PLAX 2D LVIDd:         6.60 cm      Diastology LVIDs:         6.30 cm      LV e' medial:    4.57 cm/s LV PW:         1.20 cm      LV E/e' medial:  19.5 LV IVS:        1.00 cm      LV e' lateral:   10.00 cm/s LVOT diam:     2.40 cm      LV E/e' lateral:  8.9 LV SV:         67 LV SV Index:   34 LVOT Area:     4.52 cm  LV Volumes (MOD) LV vol d, MOD A2C: 273.5 ml LV vol d, MOD A4C: 225.7 ml LV vol s, MOD A2C: 214.5 ml LV vol s, MOD A4C: 177.3 ml LV SV MOD A2C:     59.0 ml LV SV MOD A4C:     225.7 ml LV SV MOD BP:      52.8 ml RIGHT VENTRICLE            IVC RV S prime:     7.83 cm/s  IVC diam: 2.50 cm TAPSE (M-mode): 1.6 cm LEFT ATRIUM             Index        RIGHT ATRIUM           Index LA diam:        4.30 cm 2.20 cm/m   RA Area:     28.50 cm LA Vol (A2C):   93.9 ml 48.07 ml/m  RA Volume:   105.00 ml 53.75 ml/m LA Vol (A4C):   74.7 ml 38.24 ml/m LA Biplane Vol: 87.2 ml 44.64 ml/m  AORTIC VALVE LVOT Vmax:   93.80 cm/s LVOT  Vmean:  62.400 cm/s LVOT VTI:    0.148 m  AORTA Ao Root diam: 3.50 cm Ao Asc diam:  4.15 cm MITRAL VALVE               TRICUSPID VALVE MV Area (PHT): 4.39 cm    TR Peak grad:   30.9 mmHg MV Decel Time: 173 msec    TR Vmax:        278.00 cm/s MV E velocity: 89.10 cm/s                            SHUNTS                            Systemic VTI:  0.15 m                            Systemic Diam: 2.40 cm Kardie Tobb DO Electronically signed by Dub Huntsman DO Signature Date/Time: 02/11/2024/2:38:48 PM    Final    CT CHEST WO CONTRAST Result Date: 02/10/2024 CLINICAL DATA:  Respiratory illness.  Nondiagnostic x-ray EXAM: CT CHEST WITHOUT CONTRAST TECHNIQUE: Multidetector CT imaging of the chest was performed following the standard protocol without IV contrast. RADIATION DOSE REDUCTION: This exam was performed according to the departmental dose-optimization program which includes automated exposure control, adjustment of the mA and/or kV according to patient size and/or use of iterative reconstruction technique. COMPARISON:  Plain film of earlier today FINDINGS: Cardiovascular: Aortic atherosclerosis. Moderate cardiomegaly, without pericardial effusion. Three vessel coronary artery calcification. Pulmonary artery enlargement, outflow tract 3.6 cm. Mediastinum/Nodes: Right paratracheal 11 mm node on 73/2. Hilar regions poorly evaluated without intravenous contrast. Lungs/Pleura: Small left and trace right pleural effusions. Left lower lobe airspace and ground-glass opacity. More mild subpleural left upper airspace disease. 9 mm right lower lobe nodular density could represent atelectasis on 151/3 given its deep costophrenic sulcus position. Upper Abdomen: Normal imaged portions of the liver, spleen, stomach, pancreas, gallbladder, adrenal glands. Mild renal cortical thinning bilaterally. Left renal collecting system calculi, including a dominant upper pole 2.2 cm  stone. Trace upper abdominal ascites. Anasarca.  Musculoskeletal: Mild bilateral gynecomastia. Mild T4, minimal T5, and mild T7 superior endplate compression deformities. IMPRESSION: 1. Left lower and less so left upper lobe pneumonia. 2. Small, left larger than right pleural effusions. 3. Apparent right lower lobe nodular density most likely represents atelectasis or scarring, given position and morphology. Recommend chest CT follow-up at 3-6 months to confirm stability or resolution. 4. Mild right paratracheal adenopathy is likely reactive. 5. Small volume upper abdominal ascites. 6. Left nephrolithiasis.  Bilateral renal atrophy. 7. Pulmonary artery enlargement suggests pulmonary arterial hypertension. 8. Coronary artery atherosclerosis. Aortic Atherosclerosis (ICD10-I70.0). Electronically Signed   By: Rockey Kilts M.D.   On: 02/10/2024 10:41   DG Chest Port 1 View Result Date: 02/10/2024 CLINICAL DATA:  Fourteen day history of cough and shortness of breath EXAM: PORTABLE CHEST 1 VIEW COMPARISON:  Chest radiograph dated 06/14/2023 FINDINGS: Normal lung volumes. Confluent left basilar opacity. Trace blunting of bilateral costophrenic angles. No pneumothorax. Similar enlarged cardiomediastinal silhouette. No acute osseous abnormality. IMPRESSION: 1. Confluent left basilar opacity, suspicious for aspiration or pneumonia. 2. Trace blunting of bilateral costophrenic angles, which may represent trace pleural effusions. Electronically Signed   By: Limin  Xu M.D.   On: 02/10/2024 09:35       The results of significant diagnostics from this hospitalization (including imaging, microbiology, ancillary and laboratory) are listed below for reference.     Microbiology: Recent Results (from the past 240 hours)  Urine Culture     Status: Abnormal   Collection Time: 02/10/24 11:17 AM   Specimen: Urine, Random  Result Value Ref Range Status   Specimen Description URINE, RANDOM  Final   Special Requests   Final    NONE Reflexed from [id number] Performed at [facility], [address] [place], [place] [address]    Culture >=100,000 COLONIES/mL ENTEROCOCCUS FAECALIS (A)  Final   Report Status 02/13/2024 FINAL  Final   Organism ID, Bacteria ENTEROCOCCUS FAECALIS (A)  Final      Susceptibility   Enterococcus faecalis - MIC*    AMPICILLIN <=2 SENSITIVE Sensitive     NITROFURANTOIN <=16 SENSITIVE Sensitive     VANCOMYCIN 1 SENSITIVE Sensitive     * >=100,000 COLONIES/mL ENTEROCOCCUS FAECALIS  Culture, blood (routine x 2)     Status: None   Collection Time: 02/10/24  8:28 PM   Specimen: BLOOD RIGHT HAND  Result Value Ref Range Status   Specimen Description BLOOD RIGHT HAND  Final   Special Requests   Final    BOTTLES DRAWN AEROBIC AND ANAEROBIC Blood Culture results may not be optimal due to an inadequate volume of blood received in culture bottles   Culture   Final    NO GROWTH 5 DAYS Performed at [facility], [address], [place], [place] [address]    Report Status 02/15/2024 FINAL  Final     Labs:  CBC: Recent Labs  Lab 02/14/24 0624 02/15/24 0500 02/16/24 0215 02/17/24 0540  WBC 3.9* 3.6* 4.9 4.3  HGB 8.2* 7.7* 7.4* 8.1*  HCT 25.7* 24.6* 24.2* 26.0*  MCV 73.9* 74.8* 75.9* 76.7*  PLT 84* 84* 87* 82*   BMP &GFR Recent Labs  Lab 02/14/24 0624 02/15/24 0500 02/16/24 0215 02/17/24 0540 02/18/24 0500  NA 134* 131* 132* 132* 133*  K 4.4 4.3 3.6 4.0 4.1  CL 97* 97* 99 100 100  CO2 25 27 24 25 23   GLUCOSE 120* 114* 148* 102* 103*  BUN 38* 40* 41* 38* 38*  CREATININE 2.55* 2.49* 2.35* 2.39* 2.26*  CALCIUM  8.1* 7.9* 7.9* 8.0* 8.3*  MG 2.5* 1.8 1.7 1.4* 1.7  PHOS 2.2* 2.3* 2.2*  --   --    Estimated Creatinine Clearance: 26.2 mL/min (A) (by C-G formula based on SCr of 2.26 mg/dL (H)). Liver & Pancreas: Recent Labs  Lab 02/14/24 0624 02/15/24 0500 02/16/24 0215 02/17/24 0540  AST 28 31 32 35  ALT 22 19 20 21   ALKPHOS 59 52 52 51  BILITOT 2.1* 2.3* 2.0* 2.6*  PROT 5.2* 5.2* 5.5* 5.5*  ALBUMIN 2.1* 2.0*  2.1* 2.2*   No results for input(s): LIPASE, AMYLASE in the last 168 hours. No results for input(s): AMMONIA in the last 168 hours. Diabetic: No results for input(s): HGBA1C in the last 72 hours. No results for input(s): GLUCAP in the last 168 hours. Cardiac Enzymes: No results for input(s): CKTOTAL, CKMB, CKMBINDEX, TROPONINI in the last 168 hours. No results for input(s): PROBNP in the last 8760 hours. Coagulation Profile: No results for input(s): INR, PROTIME in the last 168 hours. Thyroid Function Tests: No results for input(s): TSH, T4TOTAL, FREET4, T3FREE, THYROIDAB in the last 72 hours. Lipid Profile: No results for input(s): CHOL, HDL, LDLCALC, TRIG, CHOLHDL, LDLDIRECT in the last 72 hours. Anemia Panel: No results for input(s): VITAMINB12, FOLATE, FERRITIN, TIBC, IRON, RETICCTPCT in the last 72 hours. Urine analysis:    Component Value Date/Time   COLORURINE AMBER (A) 02/10/2024 1117   APPEARANCEUR HAZY (A) 02/10/2024 1117   APPEARANCEUR Clear 11/30/2020 1006   LABSPEC 1.014 02/10/2024 1117   PHURINE 5.0 02/10/2024 1117   GLUCOSEU NEGATIVE 02/10/2024 1117   HGBUR MODERATE (A) 02/10/2024 1117   BILIRUBINUR NEGATIVE 02/10/2024 1117   BILIRUBINUR Negative 11/30/2020 1006   KETONESUR NEGATIVE 02/10/2024 1117   PROTEINUR 30 (A) 02/10/2024 1117   NITRITE NEGATIVE 02/10/2024 1117   LEUKOCYTESUR MODERATE (A) 02/10/2024 1117   Sepsis Labs: Invalid input(s): PROCALCITONIN, LACTICIDVEN   SIGNED:  Mignon ONEIDA Bump, MD  [facility] 02/20/2024, 9:01 AM

## 2024-02-20 NOTE — Progress Notes (Signed)
 PICC removal order placed for discharge. Pressure dressing applied, no drainage noted. Return precautions given to patient. PICC was removed without difficulty. Instructed to leave dressing in place for 48 hours. All questions answered. No other needs at this time.   Jaimie Redditt R Meeya Goldin, RN

## 2024-02-20 NOTE — Progress Notes (Signed)
 Physical Therapy Treatment Patient Details Name: Joseph Tanner MRN: [id number] DOB: 1951-03-17 Today's Date: 02/20/2024   History of Present Illness 73 y.o. male presents to [facility] 02/10/24 with SOB. Chest x-ray concerning for aspiration or PNA, +UTI, + cocaine. PMHx: hypertension, hyperlipidemia, combined systolic and diastolic CHF, NSTEMI, CAD s/p PCI, COPD, CKD stage IV, and cocaine abuse. PT asked re-evalute if patient needs SNF on 7/22.    PT Comments  Pt received in supine, c/o fatigue after working with mobility specialist and AM but agreeable to limited therapy session at bedside with encouragement. PTA obtained paper scrub outfit for him since pt reports he does not have clean clothing to change into prior to impending DC. Pt needing up to minA for sit<>stand and performs step pivot x2 OOB to chair/EOB with up to minA with single UE support. Pt c/o dizziness in standing, when BP assessed seated after this reading 102/61 and HR 92 bpm. Patient will benefit from continued inpatient follow up therapy, <3 hours/day.    If plan is discharge home, recommend the following: A little help with walking and/or transfers;A little help with bathing/dressing/bathroom;Assistance with cooking/housework;Assist for transportation;Help with stairs or ramp for entrance   Can travel by private vehicle     No  Equipment Recommendations  Rolling walker (2 wheels)    Recommendations for Other Services       Precautions / Restrictions Precautions Precautions: Fall Recall of Precautions/Restrictions: Intact Restrictions Weight Bearing Restrictions Per Provider Order: No     Mobility  Bed Mobility Overal bed mobility: Needs Assistance Bed Mobility: Supine to Sit     Supine to sit: Used rails, Contact guard, HOB elevated     General bed mobility comments: Increased time/effort to perform but pt defers needing lift assist for trunk/BLE.    Transfers Overall transfer level: Needs  assistance Equipment used: Rollator (4 wheels), 1 person hand held assist Transfers: Sit to/from Stand, Bed to chair/wheelchair/BSC Sit to Stand: Contact guard assist, Min assist   Step pivot transfers: Contact guard assist, Min assist       General transfer comment: minA for sit>stand from lowest bed height>rollator, then CGA from higher rollator seat and HHA for safety once standing; minA for pivotal steps with only +1 HHA on second step pivot, but CGA for step pivot initially with BUE support of counterop near his bed.    Ambulation/Gait Ambulation/Gait assistance: Min assist   Assistive device: 1 person hand held assist Gait Pattern/deviations: Shuffle     Pre-gait activities: standing hip flexion x5 reps ea with RUE on countertop and LUE HHA, pt defers additional reps and c/o dizziness, requesting a seated rest break, so pivoted to rollator; no ambulation away from bed other than step pivot transfers x2     Stairs             Wheelchair Mobility     Tilt Bed    Modified Rankin (Stroke Patients Only)       Balance Overall balance assessment: Needs assistance Sitting-balance support: Feet supported, No upper extremity supported Sitting balance-Leahy Scale: Fair     Standing balance support: No upper extremity supported, Reliant on assistive device for balance, During functional activity Standing balance-Leahy Scale: Poor Standing balance comment: reliant on 1 UE support for static standing today and BUE support for dynamic standing tasks                            Communication Communication  Communication: No apparent difficulties  Cognition Arousal: Alert Behavior During Therapy: Flat affect   PT - Cognitive impairments: No apparent impairments                       PT - Cognition Comments: Slow to initiate but mostly c/o fatigue so likely due to this. Following commands: Intact      Cueing Cueing Techniques: Verbal cues   Exercises Other Exercises Other Exercises: standing hip flexion x5 reps prior to fatigue, pt defers additional HEP exercises Other Exercises: STS x2 for strengthening and static standing ~3 mins x2 reps Other Exercises: pt instructed on increased supine/seated BLE ROM and HEP to build tolerance for more OOB    General Comments General comments (skin integrity, edema, etc.): BP taken seated in rollator after c/o dizziness while standing prior; BP 102/61 HR 92 bpm in seated posture; pt requesting to sit EOB at end of session and denies increased dizziness once sitting EOB to rest. PTA obtained paper scrub outfit for him since pt reports his clothes are not clean.      Pertinent Vitals/Pain Pain Assessment Pain Assessment: Faces Faces Pain Scale: Hurts a little bit Pain Location: not localized; intermittent guarding Pain Descriptors / Indicators: Guarding Pain Intervention(s): Limited activity within patient's tolerance, Monitored during session, Repositioned    Home Living                          Prior Function            PT Goals (current goals can now be found in the care plan section) Acute Rehab PT Goals Patient Stated Goal: to get stronger PT Goal Formulation: With patient Time For Goal Achievement: 02/25/24 Progress towards PT goals: Progressing toward goals    Frequency    Min 2X/week      PT Plan      Co-evaluation              AM-PAC PT 6 Clicks Mobility   Outcome Measure  Help needed turning from your back to your side while in a flat bed without using bedrails?: A Little Help needed moving from lying on your back to sitting on the side of a flat bed without using bedrails?: A Little Help needed moving to and from a bed to a chair (including a wheelchair)?: A Little Help needed standing up from a chair using your arms (e.g., wheelchair or bedside chair)?: A Little Help needed to walk in hospital room?: Total (too fatigued today) Help  needed climbing 3-5 steps with a railing? : Total 6 Click Score: 14    End of Session Equipment Utilized During Treatment: Other (comment) (HHA, defer gait belt since pt refusing ambulation) Activity Tolerance: Patient limited by fatigue;Other (comment) (+ c/o dizzy) Patient left: in bed;with call bell/phone within reach;with bed alarm set;Other (comment) (pt sitting EOB, warm blanket placed in his lap) Nurse Communication: Mobility status;Other (comment) (pt assisted to don paper scrub clothing since he is DC soon and he does not have clean clothing with him) PT Visit Diagnosis: Unsteadiness on feet (R26.81);Other abnormalities of gait and mobility (R26.89);Muscle weakness (generalized) (M62.81)     Time: 8661-8647 PT Time Calculation (min) (ACUTE ONLY): 14 min  Charges:    $Therapeutic Exercise: 8-22 mins PT General Charges $$ ACUTE PT VISIT: 1 Visit  Connell SQUIBB., PTA Acute Rehabilitation Services Secure Chat Preferred 9a-5:30pm Office: [phone]    Connell HERO [facility] 02/20/2024, 2:08 PM

## 2024-02-21 ENCOUNTER — Other Ambulatory Visit (HOSPITAL_COMMUNITY): Payer: Self-pay

## 2024-02-25 ENCOUNTER — Telehealth (HOSPITAL_COMMUNITY): Payer: Self-pay

## 2024-02-25 NOTE — Progress Notes (Incomplete)
 Advanced [facility] Failure Clinic Note   Referring Physician: PCP: Pcp, No PCP-Cardiologist: Annabella Scarce, MD   Chief Complaint:  HPI:     73 y.o. male with history of chronic systolic heart failure, CAD, COPD, CKD IV, HTN, HLD, hx cocaine abuse, tobacco use, recurrent UTIs and poor compliance, recently admitted w/ a/c systolic heart failure w/ low-output. Echo w/ Bi-V failure, EF 20-25%, RV severely reduced. He was seen by our AHF team during admit. Felt to be ACC/AHA stage D cardiomyopathy w/ NYHA class IV on admission. He required IV inotropes to assist w/ diuresis. He was placed on DBA. He had difficulty weaning off given hypotension and poor perfusion. Not a candidate for any advanced therapies. Palliative care  discussed home hospice. AHF team agreed to see post hospital to help w/ volume management.    He presents today for post hospital f/u      Review of Systems: [y] = yes, [ ]  = no   General: Weight gain [ ] ; Weight loss [ ] ; Anorexia [ ] ; Fatigue [ ] ; Fever [ ] ; Chills [ ] ; Weakness [ ]   Cardiac: Chest pain/pressure [ ] ; Resting SOB [ ] ; Exertional SOB [ ] ; Orthopnea [ ] ; Pedal Edema [ ] ; Palpitations [ ] ; Syncope [ ] ; Presyncope [ ] ; Paroxysmal nocturnal dyspnea[ ]   Pulmonary: Cough [ ] ; Wheezing[ ] ; Hemoptysis[ ] ; Sputum [ ] ; Snoring [ ]   GI: Vomiting[ ] ; Dysphagia[ ] ; Melena[ ] ; Hematochezia [ ] ; Heartburn[ ] ; Abdominal pain [ ] ; Constipation [ ] ; Diarrhea [ ] ; BRBPR [ ]   GU: Hematuria[ ] ; Dysuria [ ] ; Nocturia[ ]   Vascular: Pain in legs with walking [ ] ; Pain in feet with lying flat [ ] ; Non-healing sores [ ] ; Stroke [ ] ; TIA [ ] ; Slurred speech [ ] ;  Neuro: Headaches[ ] ; Vertigo[ ] ; Seizures[ ] ; Paresthesias[ ] ;Blurred vision [ ] ; Diplopia [ ] ; Vision changes [ ]   Ortho/Skin: Arthritis [ ] ; Joint pain [ ] ; Muscle pain [ ] ; Joint swelling [ ] ; Back Pain [ ] ; Rash [ ]   Psych: Depression[ ] ; Anxiety[ ]   Heme: Bleeding problems [ ] ; Clotting disorders [ ] ; Anemia [ ]    Endocrine: Diabetes [ ] ; Thyroid dysfunction[ ]    Past Medical History:  Diagnosis Date   COPD (chronic obstructive pulmonary disease) (HCC)    Hyperlipemia    Hypertension    NSTEMI (non-ST elevated myocardial infarction) (HCC)    Pure hypercholesterolemia 03/15/2021    Current Outpatient Medications  Medication Sig Dispense Refill   acetaminophen  (TYLENOL ) 325 MG tablet Take 2 tablets (650 mg total) by mouth every 6 (six) hours as needed for mild pain (pain score 1-3) or fever (or Fever >/= 101).     albuterol  (VENTOLIN  HFA) 108 (90 Base) MCG/ACT inhaler Inhale 2 puffs into the lungs every 6 (six) hours as needed for wheezing or shortness of breath.     atorvastatin  (LIPITOR) 40 MG tablet Take 1 tablet (40 mg total) by mouth every evening. 90 tablet 2   feeding supplement (ENSURE PLUS HIGH PROTEIN) LIQD Take 237 mLs by mouth 3 (three) times daily between meals.     folic acid  (FOLVITE ) 1 MG tablet Take 1 tablet (1 mg total) by mouth daily.     guaiFENesin  (MUCINEX ) 600 MG 12 hr tablet Take 1 tablet (600 mg total) by mouth 2 (two) times daily.     hydrOXYzine  (ATARAX ) 10 MG tablet Take 1 tablet (10 mg total) by mouth 3 (three) times daily as needed for anxiety.  melatonin 5 MG TABS Take 1 tablet (5 mg total) by mouth at bedtime as needed (insomnia).     mexiletine (MEXITIL ) 200 MG capsule Take 1 capsule (200 mg total) by mouth every 12 (twelve) hours. 180 capsule 0   Multiple Vitamin (MULTIVITAMIN WITH MINERALS) TABS tablet Take 1 tablet by mouth daily.     tamsulosin  (FLOMAX ) 0.4 MG CAPS capsule Take 1 capsule (0.4 mg total) by mouth daily after breakfast. 90 capsule 0   thiamine  (VITAMIN B-1) 100 MG tablet Take 1 tablet (100 mg total) by mouth daily.     torsemide  (DEMADEX ) 20 MG tablet Take 2 tablets (40 mg total) by mouth daily as needed (fluid / edema). 180 tablet 0   No current facility-administered medications for this visit.    No Known Allergies    Social History    Socioeconomic History   Marital status: Divorced    Spouse name: Not on file   Number of children: 5   Years of education: Not on file   Highest education level: Associate degree: academic program  Occupational History   Occupation: Retired  Tobacco Use   Smoking status: Some Days    Types: Cigarettes   Smokeless tobacco: Never  Vaping Use   Vaping status: Never Used  Substance and Sexual Activity   Alcohol use: Not Currently   Drug use: Yes    Types: Cocaine    Comment: 6 months   Sexual activity: Not on file  Other Topics Concern   Not on file  Social History Narrative   Not on file   Social Drivers of Health   Financial Resource Strain: Low Risk  (06/17/2023)   Overall Financial Resource Strain (CARDIA)    Difficulty of Paying Living Expenses: Not very hard  Food Insecurity: No Food Insecurity (02/10/2024)   Hunger Vital Sign    Worried About Running Out of Food in the Last Year: Never true    Ran Out of Food in the Last Year: Never true  Transportation Needs: No Transportation Needs (02/10/2024)   PRAPARE - Administrator, Civil Service (Medical): No    Lack of Transportation (Non-Medical): No  Physical Activity: Not on file  Stress: Not on file  Social Connections: Socially Isolated (02/10/2024)   Social Connection and Isolation Panel    Frequency of Communication with Friends and Family: Once a week    Frequency of Social Gatherings with Friends and Family: Once a week    Attends Religious Services: Never    Database administrator or Organizations: No    Attends Banker Meetings: Never    Marital Status: Separated  Intimate Partner Violence: Not At Risk (02/10/2024)   Humiliation, Afraid, Rape, and Kick questionnaire    Fear of Current or Ex-Partner: No    Emotionally Abused: No    Physically Abused: No    Sexually Abused: No      Family History  Problem Relation Age of Onset   Heart failure Mother    Heart failure Maternal  Grandmother    Heart failure Maternal Grandfather    Heart failure Paternal Grandmother    Heart failure Paternal Grandfather     There were no vitals filed for this visit.   PHYSICAL EXAM: General:  Well appearing. No respiratory difficulty HEENT: normal Neck: supple. no JVD. Carotids 2+ bilat; no bruits. No lymphadenopathy or thyromegaly appreciated. Cor: PMI nondisplaced. Regular rate & rhythm. No rubs, gallops or murmurs. Lungs: clear Abdomen: soft,  nontender, nondistended. No hepatosplenomegaly. No bruits or masses. Good bowel sounds. Extremities: no cyanosis, clubbing, rash, edema Neuro: alert & oriented x 3, cranial nerves grossly intact. moves all 4 extremities w/o difficulty. Affect pleasant.  ECG:   ASSESSMENT & PLAN:  1. ACC/AHA stage D cardiomyopathy - End- stage w/ recent hospitalization requiring inotropic support to aid in palliative diuresis. Not candidate for advanced therapies - NYHA Class - Volume  - GDMT limited due to worsening renal failure/cardiorenal syndrome and hypotension  - Being followed by hospice    Caffie Shed, PA-C 02/25/24

## 2024-02-25 NOTE — Telephone Encounter (Signed)
 Called and spoke to pt's wife [name] to confirm/remind patient of their appointment at the [facility] on 02/26/24. However, pt needed to reschedule for a later date and will call our office to schedule.

## 2024-02-26 ENCOUNTER — Encounter (HOSPITAL_COMMUNITY)

## 2024-03-17 ENCOUNTER — Emergency Department (HOSPITAL_COMMUNITY)

## 2024-03-17 ENCOUNTER — Inpatient Hospital Stay (HOSPITAL_COMMUNITY)
Admission: EM | Admit: 2024-03-17 | Discharge: 2024-04-01 | DRG: 291 | Disposition: A | Discharge status: Skilled Nursing Facility | Attending: Internal Medicine | Admitting: Internal Medicine

## 2024-03-17 DIAGNOSIS — F1410 Cocaine abuse, uncomplicated: Secondary | ICD-10-CM | POA: Diagnosis present

## 2024-03-17 DIAGNOSIS — Z8249 Family history of ischemic heart disease and other diseases of the circulatory system: Secondary | ICD-10-CM

## 2024-03-17 DIAGNOSIS — Z681 Body mass index (BMI) 19 or less, adult: Secondary | ICD-10-CM

## 2024-03-17 DIAGNOSIS — I252 Old myocardial infarction: Secondary | ICD-10-CM

## 2024-03-17 DIAGNOSIS — I130 Hypertensive heart and chronic kidney disease with heart failure and stage 1 through stage 4 chronic kidney disease, or unspecified chronic kidney disease: Secondary | ICD-10-CM | POA: Diagnosis not present

## 2024-03-17 DIAGNOSIS — D61818 Other pancytopenia: Secondary | ICD-10-CM | POA: Diagnosis present

## 2024-03-17 DIAGNOSIS — I5042 Chronic combined systolic (congestive) and diastolic (congestive) heart failure: Secondary | ICD-10-CM | POA: Diagnosis not present

## 2024-03-17 DIAGNOSIS — I472 Ventricular tachycardia, unspecified: Secondary | ICD-10-CM | POA: Diagnosis not present

## 2024-03-17 DIAGNOSIS — R32 Unspecified urinary incontinence: Secondary | ICD-10-CM | POA: Diagnosis present

## 2024-03-17 DIAGNOSIS — I5023 Acute on chronic systolic (congestive) heart failure: Secondary | ICD-10-CM | POA: Diagnosis present

## 2024-03-17 DIAGNOSIS — B9620 Unspecified Escherichia coli [E. coli] as the cause of diseases classified elsewhere: Secondary | ICD-10-CM | POA: Diagnosis present

## 2024-03-17 DIAGNOSIS — I429 Cardiomyopathy, unspecified: Secondary | ICD-10-CM | POA: Diagnosis present

## 2024-03-17 DIAGNOSIS — E88A Wasting disease (syndrome) due to underlying condition: Secondary | ICD-10-CM | POA: Diagnosis present

## 2024-03-17 DIAGNOSIS — F17210 Nicotine dependence, cigarettes, uncomplicated: Secondary | ICD-10-CM | POA: Diagnosis present

## 2024-03-17 DIAGNOSIS — I509 Heart failure, unspecified: Secondary | ICD-10-CM | POA: Diagnosis not present

## 2024-03-17 DIAGNOSIS — R197 Diarrhea, unspecified: Secondary | ICD-10-CM | POA: Diagnosis present

## 2024-03-17 DIAGNOSIS — I5084 End stage heart failure: Secondary | ICD-10-CM | POA: Diagnosis present

## 2024-03-17 DIAGNOSIS — Z91148 Patient's other noncompliance with medication regimen for other reason: Secondary | ICD-10-CM

## 2024-03-17 DIAGNOSIS — N179 Acute kidney failure, unspecified: Secondary | ICD-10-CM | POA: Diagnosis present

## 2024-03-17 DIAGNOSIS — I2510 Atherosclerotic heart disease of native coronary artery without angina pectoris: Secondary | ICD-10-CM | POA: Diagnosis present

## 2024-03-17 DIAGNOSIS — Z66 Do not resuscitate: Secondary | ICD-10-CM | POA: Diagnosis present

## 2024-03-17 DIAGNOSIS — N184 Chronic kidney disease, stage 4 (severe): Secondary | ICD-10-CM | POA: Diagnosis present

## 2024-03-17 DIAGNOSIS — J449 Chronic obstructive pulmonary disease, unspecified: Secondary | ICD-10-CM | POA: Diagnosis present

## 2024-03-17 DIAGNOSIS — K921 Melena: Secondary | ICD-10-CM | POA: Diagnosis present

## 2024-03-17 DIAGNOSIS — E871 Hypo-osmolality and hyponatremia: Secondary | ICD-10-CM | POA: Diagnosis present

## 2024-03-17 DIAGNOSIS — E8779 Other fluid overload: Principal | ICD-10-CM

## 2024-03-17 DIAGNOSIS — I5082 Biventricular heart failure: Secondary | ICD-10-CM | POA: Diagnosis present

## 2024-03-17 DIAGNOSIS — E7800 Pure hypercholesterolemia, unspecified: Secondary | ICD-10-CM | POA: Diagnosis present

## 2024-03-17 DIAGNOSIS — E43 Unspecified severe protein-calorie malnutrition: Secondary | ICD-10-CM | POA: Diagnosis present

## 2024-03-17 DIAGNOSIS — B952 Enterococcus as the cause of diseases classified elsewhere: Secondary | ICD-10-CM | POA: Diagnosis present

## 2024-03-17 DIAGNOSIS — E876 Hypokalemia: Secondary | ICD-10-CM | POA: Diagnosis present

## 2024-03-17 DIAGNOSIS — Z79899 Other long term (current) drug therapy: Secondary | ICD-10-CM

## 2024-03-17 DIAGNOSIS — N390 Urinary tract infection, site not specified: Secondary | ICD-10-CM | POA: Diagnosis present

## 2024-03-17 DIAGNOSIS — I959 Hypotension, unspecified: Secondary | ICD-10-CM | POA: Diagnosis not present

## 2024-03-17 DIAGNOSIS — I5043 Acute on chronic combined systolic (congestive) and diastolic (congestive) heart failure: Secondary | ICD-10-CM | POA: Diagnosis present

## 2024-03-17 LAB — CBC WITH DIFFERENTIAL/PLATELET
Abs Granulocyte: 5.5 K/uL (ref 1.5–6.5)
Abs Immature Granulocytes: 0.03 K/uL (ref 0.00–0.07)
Basophils Absolute: 0 K/uL (ref 0.0–0.1)
Basophils Relative: 0 %
Eosinophils Absolute: 0 K/uL (ref 0.0–0.5)
Eosinophils Relative: 0 %
HCT: 29.1 % — ABNORMAL LOW (ref 39.0–52.0)
Hemoglobin: 8.7 g/dL — ABNORMAL LOW (ref 13.0–17.0)
Immature Granulocytes: 0 %
Lymphocytes Relative: 10 %
Lymphs Abs: 0.7 K/uL (ref 0.7–4.0)
MCH: 22.8 pg — ABNORMAL LOW (ref 26.0–34.0)
MCHC: 29.9 g/dL — ABNORMAL LOW (ref 30.0–36.0)
MCV: 76.2 fL — ABNORMAL LOW (ref 80.0–100.0)
Monocytes Absolute: 0.6 K/uL (ref 0.1–1.0)
Monocytes Relative: 8 %
Neutro Abs: 5.5 K/uL (ref 1.7–7.7)
Neutrophils Relative %: 82 %
Platelets: 107 K/uL — ABNORMAL LOW (ref 150–400)
RBC: 3.82 MIL/uL — ABNORMAL LOW (ref 4.22–5.81)
RDW: 23.3 % — ABNORMAL HIGH (ref 11.5–15.5)
Smear Review: NORMAL
WBC: 6.9 K/uL (ref 4.0–10.5)
nRBC: 0.3 % — ABNORMAL HIGH (ref 0.0–0.2)

## 2024-03-17 LAB — URINALYSIS, ROUTINE W REFLEX MICROSCOPIC
Bilirubin Urine: NEGATIVE
Glucose, UA: NEGATIVE mg/dL
Hgb urine dipstick: NEGATIVE
Ketones, ur: NEGATIVE mg/dL
Nitrite: NEGATIVE
Protein, ur: 30 mg/dL — AB
Specific Gravity, Urine: 1.014 (ref 1.005–1.030)
WBC, UA: >50 WBC/hpf (ref 0–5)
pH: 5 (ref 5.0–8.0)

## 2024-03-17 LAB — COMPREHENSIVE METABOLIC PANEL WITH GFR
ALT: 14 U/L (ref 0–44)
AST: 54 U/L — ABNORMAL HIGH (ref 15–41)
Albumin: 2.4 g/dL — ABNORMAL LOW (ref 3.5–5.0)
Alkaline Phosphatase: 53 U/L (ref 38–126)
Anion gap: 12 (ref 5–15)
BUN: 37 mg/dL — ABNORMAL HIGH (ref 8–23)
CO2: 18 mmol/L — ABNORMAL LOW (ref 22–32)
Calcium: 8.4 mg/dL — ABNORMAL LOW (ref 8.9–10.3)
Chloride: 100 mmol/L (ref 98–111)
Creatinine, Ser: 2.39 mg/dL — ABNORMAL HIGH (ref 0.61–1.24)
GFR, Estimated: 28 mL/min — ABNORMAL LOW (ref >60)
Glucose, Bld: 119 mg/dL — ABNORMAL HIGH (ref 70–99)
Potassium: 5 mmol/L (ref 3.5–5.1)
Sodium: 130 mmol/L — ABNORMAL LOW (ref 135–145)
Total Bilirubin: 3.5 mg/dL — ABNORMAL HIGH (ref 0.0–1.2)
Total Protein: 6.3 g/dL — ABNORMAL LOW (ref 6.5–8.1)

## 2024-03-17 LAB — PROTIME-INR
INR: 1.3 — ABNORMAL HIGH (ref 0.8–1.2)
Prothrombin Time: 16.8 s — ABNORMAL HIGH (ref 11.4–15.2)

## 2024-03-17 LAB — RAPID URINE DRUG SCREEN, HOSP PERFORMED
Amphetamines: NOT DETECTED
Barbiturates: NOT DETECTED
Benzodiazepines: NOT DETECTED
Cocaine: NOT DETECTED
Opiates: NOT DETECTED
Tetrahydrocannabinol: NOT DETECTED

## 2024-03-17 LAB — I-STAT CG4 LACTIC ACID, ED: Lactic Acid, Venous: 1.9 mmol/L (ref 0.5–1.9)

## 2024-03-17 LAB — POC OCCULT BLOOD, ED: Fecal Occult Bld: NEGATIVE

## 2024-03-17 LAB — BRAIN NATRIURETIC PEPTIDE: B Natriuretic Peptide: >4500 pg/mL — ABNORMAL HIGH (ref 0.0–100.0)

## 2024-03-17 MED ORDER — SODIUM CHLORIDE 0.9 % IV SOLN: 1.0000 g | INTRAVENOUS | Status: DC

## 2024-03-17 MED ORDER — SODIUM CHLORIDE 0.9 % IV SOLN: 250.0000 mL | INTRAVENOUS | Status: AC | PRN

## 2024-03-17 MED ORDER — ACETAMINOPHEN 650 MG RE SUPP: 650.0000 mg | Freq: Four times a day (QID) | RECTAL | Status: DC | PRN

## 2024-03-17 MED ORDER — SODIUM CHLORIDE 0.9% FLUSH: 3.0000 mL | INTRAVENOUS | Status: DC | PRN

## 2024-03-17 MED ORDER — ATORVASTATIN CALCIUM 40 MG PO TABS
40.0000 mg | ORAL_TABLET | Freq: Every evening | ORAL | Status: DC
  Administered 2024-03-17 – 2024-03-31 (×15): 40 mg via ORAL
  Filled 2024-03-17 (×16): qty 1

## 2024-03-17 MED ORDER — UMECLIDINIUM BROMIDE 62.5 MCG/ACT IN AEPB
1.0000 | INHALATION_SPRAY | Freq: Every day | RESPIRATORY_TRACT | Status: DC
  Administered 2024-03-18 – 2024-04-01 (×13): 1 via RESPIRATORY_TRACT
  Filled 2024-03-17 (×2): qty 7

## 2024-03-17 MED ORDER — FUROSEMIDE 10 MG/ML IJ SOLN
40.0000 mg | Freq: Once | INTRAMUSCULAR | Status: AC
  Administered 2024-03-17: 40 mg via INTRAVENOUS
  Filled 2024-03-17: qty 4

## 2024-03-17 MED ORDER — ALBUTEROL SULFATE (2.5 MG/3ML) 0.083% IN NEBU: 2.5000 mg | INHALATION_SOLUTION | Freq: Four times a day (QID) | RESPIRATORY_TRACT | Status: DC | PRN

## 2024-03-17 MED ORDER — ADULT MULTIVITAMIN W/MINERALS CH
1.0000 | ORAL_TABLET | Freq: Every day | ORAL | Status: DC
  Administered 2024-03-17 – 2024-04-01 (×16): 1 via ORAL
  Filled 2024-03-17 (×16): qty 1

## 2024-03-17 MED ORDER — FOLIC ACID 1 MG PO TABS
1.0000 mg | ORAL_TABLET | Freq: Every day | ORAL | Status: DC
  Administered 2024-03-17 – 2024-04-01 (×16): 1 mg via ORAL
  Filled 2024-03-17 (×16): qty 1

## 2024-03-17 MED ORDER — ENSURE PLUS HIGH PROTEIN PO LIQD
237.0000 mL | Freq: Three times a day (TID) | ORAL | Status: DC
  Administered 2024-03-17 – 2024-04-01 (×35): 237 mL via ORAL

## 2024-03-17 MED ORDER — TORSEMIDE 20 MG PO TABS: 40.0000 mg | ORAL_TABLET | Freq: Every day | ORAL | Status: DC | PRN

## 2024-03-17 MED ORDER — ACETAMINOPHEN 325 MG PO TABS
650.0000 mg | ORAL_TABLET | Freq: Four times a day (QID) | ORAL | Status: DC | PRN
  Administered 2024-03-25: 650 mg via ORAL
  Filled 2024-03-17: qty 2

## 2024-03-17 MED ORDER — TRAZODONE HCL 50 MG PO TABS
50.0000 mg | ORAL_TABLET | Freq: Every day | ORAL | Status: DC
  Administered 2024-03-17 – 2024-03-21 (×5): 50 mg via ORAL
  Filled 2024-03-17 (×5): qty 1

## 2024-03-17 MED ORDER — TIOTROPIUM BROMIDE MONOHYDRATE 18 MCG IN CAPS: 18.0000 ug | ORAL_CAPSULE | Freq: Every day | RESPIRATORY_TRACT | Status: DC

## 2024-03-17 MED ORDER — SODIUM CHLORIDE 0.9% FLUSH
3.0000 mL | Freq: Two times a day (BID) | INTRAVENOUS | Status: DC
  Administered 2024-03-17 – 2024-03-18 (×3): 3 mL via INTRAVENOUS

## 2024-03-17 MED ORDER — POLYETHYLENE GLYCOL 3350 17 G PO PACK
17.0000 g | PACK | Freq: Every day | ORAL | Status: DC | PRN
  Administered 2024-03-20: 17 g via ORAL
  Filled 2024-03-17: qty 1

## 2024-03-17 MED ORDER — MELATONIN 5 MG PO TABS
5.0000 mg | ORAL_TABLET | Freq: Every evening | ORAL | Status: DC | PRN
  Administered 2024-03-20 – 2024-03-21 (×2): 5 mg via ORAL
  Filled 2024-03-17 (×2): qty 1

## 2024-03-17 MED ORDER — HEPARIN SODIUM (PORCINE) 5000 UNIT/ML IJ SOLN: 5000.0000 [IU] | Freq: Three times a day (TID) | INTRAMUSCULAR | Status: DC

## 2024-03-17 MED ORDER — TAMSULOSIN HCL 0.4 MG PO CAPS
0.4000 mg | ORAL_CAPSULE | Freq: Every day | ORAL | Status: DC
  Administered 2024-03-18 – 2024-04-01 (×15): 0.4 mg via ORAL
  Filled 2024-03-17 (×15): qty 1

## 2024-03-17 MED ORDER — THIAMINE MONONITRATE 100 MG PO TABS
100.0000 mg | ORAL_TABLET | Freq: Every day | ORAL | Status: DC
  Administered 2024-03-17 – 2024-03-21 (×5): 100 mg via ORAL
  Filled 2024-03-17 (×5): qty 1

## 2024-03-17 NOTE — Hospital Course (Signed)
 73 year old male with history of HTN, HLD, combined systolic diastolic CHF (end-stage), NSTEMI, CAD status post PCI, COPD, chronic kidney disease stage IV and history of cocaine abuse who was recently hospitalized 02/20/2024 with the same.  At that time, palliative was consulted patient was changed to DNR and agreed to go home with hospice care.  He then changed his mind and decided to go to inpatient rehab.  In the inpatient rehab he was unable or unwilling to participate in therapies and so was discharged home.  He had some caregivers coming into his home but he has been increasingly swollen over the last 4 to 5 days and not taking his meds.  He has had a few bouts of incontinence as well as some diarrhea.  His caregivers do not feel safe to have him at home full-time.  In the ED he was noted to have a BNP greater than 4500 chest x-ray consistent with CHF.  Other labs appear mostly stable.  Patient's previously been on dobutamine  but his pressures have remained fairly normal for now.

## 2024-03-17 NOTE — ED Triage Notes (Signed)
 Pt bib EMS with complaint of GI bleed x3 weeks. Last episode 2 days ago. (Reports 6 bloody BM that day).  Pt also stopped taking lasix  Saturday and has BLE swelling since Saturday night... Pt also has had dyspnea and feeling lightheaded when he gets up to use the bathroom since Saturday night.   Pt A+Ox4, respirations even and unlabored.

## 2024-03-17 NOTE — Assessment & Plan Note (Signed)
 Continue statin

## 2024-03-17 NOTE — Assessment & Plan Note (Addendum)
 Patient has normal white count but low hemoglobin at 8.7 up from 7.1 during his last admission. Platelets are up at 107 improved from his baseline of 80s. Will hold heparin  and Lovenox  for SCD prophylaxis due to low platelet count. SCDs

## 2024-03-17 NOTE — ED Notes (Signed)
 Patient given lunch bag and beverage per RN. Family at bedside.

## 2024-03-17 NOTE — H&P (Signed)
 History and Physical    Patient: Joseph Tanner FMW:[id number] DOB: 1951-01-22 DOA: 03/17/2024 DOS: the patient was seen and examined on 03/17/2024 PCP: Pcp, No  Patient coming from: Home  Chief Complaint:  Chief Complaint  Patient presents with   GI Bleeding   HPI: Joseph Tanner is a 73 y.o. male with medical history significant of HTN, HLD, combined systolic diastolic CHF (end-stage), NSTEMI, CAD status post PCI, COPD, chronic kidney disease stage IV and history of cocaine abuse who was recently hospitalized 02/20/2024 with the same.  At that time, palliative was consulted patient was changed to DNR and agreed to go home with hospice care.  He then changed his mind and decided to go to inpatient rehab.  In the inpatient rehab he was unable or unwilling to participate in therapies and so was discharged home.  He had some caregivers coming into his home but he has been increasingly swollen over the last 4 to 5 days and not taking his meds.  He has had a few bouts of incontinence as well as some diarrhea.  His caregivers do not feel safe to have him at home full-time.  In the ED he was noted to have a BNP greater than 4500 chest x-ray consistent with CHF.  Other labs appear mostly stable.  Patient's previously been on dobutamine  but his pressures have remained fairly normal for now.  Review of Systems: As mentioned in the history of present illness. All other systems reviewed and are negative. Past Medical History:  Diagnosis Date   COPD (chronic obstructive pulmonary disease) (HCC)    Hyperlipemia    Hypertension    NSTEMI (non-ST elevated myocardial infarction) (HCC)    Pure hypercholesterolemia 03/15/2021   No past surgical history on file. Social History:  reports that he has been smoking cigarettes. He has never used smokeless tobacco. He reports that he does not currently use alcohol. He reports current drug use. Drug: Cocaine.  No Known Allergies  Family History   Problem Relation Age of Onset   Heart failure Mother    Heart failure Maternal Grandmother    Heart failure Maternal Grandfather    Heart failure Paternal Grandmother    Heart failure Paternal Grandfather     Prior to Admission medications   Medication Sig Start Date End Date Taking? Authorizing Provider  atorvastatin  (LIPITOR) 40 MG tablet Take 1 tablet (40 mg total) by mouth every evening. 01/29/23  Yes Lucien, Tessa N, PA-C  tamsulosin  (FLOMAX ) 0.4 MG CAPS capsule Take 1 capsule (0.4 mg total) by mouth daily after breakfast. 02/17/24  Yes Gherghe, Costin M, MD  tiotropium (SPIRIVA ) 18 MCG inhalation capsule Place 18 mcg into inhaler and inhale daily.   Yes [provider]  torsemide  (DEMADEX ) 20 MG tablet Take 2 tablets (40 mg total) by mouth daily as needed (fluid / edema). 02/17/24  Yes Gherghe, Costin M, MD  traZODone  (DESYREL ) 50 MG tablet Take 50 mg by mouth at bedtime.   Yes [provider]  acetaminophen  (TYLENOL ) 325 MG tablet Take 2 tablets (650 mg total) by mouth every 6 (six) hours as needed for mild pain (pain score 1-3) or fever (or Fever >/= 101). 02/20/24   Kathrin Mignon DASEN, MD  albuterol  (VENTOLIN  HFA) 108 (90 Base) MCG/ACT inhaler Inhale 2 puffs into the lungs every 6 (six) hours as needed for wheezing or shortness of breath.    [provider]  feeding supplement (ENSURE PLUS HIGH PROTEIN) LIQD Take 237 mLs by  mouth 3 (three) times daily between meals. 02/20/24   Gonfa, Taye T, MD  folic acid  (FOLVITE ) 1 MG tablet Take 1 tablet (1 mg total) by mouth daily. 02/21/24   Gonfa, Taye T, MD  guaiFENesin  (MUCINEX ) 600 MG 12 hr tablet Take 1 tablet (600 mg total) by mouth 2 (two) times daily. 02/20/24   Gonfa, Taye T, MD  hydrOXYzine  (ATARAX ) 10 MG tablet Take 1 tablet (10 mg total) by mouth 3 (three) times daily as needed for anxiety. 02/20/24   Gonfa, Taye T, MD  melatonin 5 MG TABS Take 1 tablet (5 mg total) by mouth at bedtime as needed (insomnia). 02/20/24    Gonfa, Taye T, MD  mexiletine (MEXITIL ) 200 MG capsule Take 1 capsule (200 mg total) by mouth every 12 (twelve) hours. 02/17/24   Gherghe, Costin M, MD  Multiple Vitamin (MULTIVITAMIN WITH MINERALS) TABS tablet Take 1 tablet by mouth daily. 11/25/20   Jadine Toribio SQUIBB, MD  thiamine  (VITAMIN B-1) 100 MG tablet Take 1 tablet (100 mg total) by mouth daily. 02/21/24   Kathrin Mignon DASEN, MD    Physical Exam: Vitals:   03/17/24 1413 03/17/24 1416 03/17/24 1420 03/17/24 1600  BP:  (!) 129/90 (!) 129/90 (!) 122/90  Pulse:  96 96 92  Resp:  20 20 19   Temp:   98 F (36.7 C)   TempSrc:   Oral   SpO2:   100% 100%  Weight: 65 kg     Height: 6' 3 (1.905 m)      Physical Examination: General appearance - chronically ill appearing and cachectic Mental status - alert, oriented to person, place, and time Chest - clear to auscultation, no wheezes, rales or rhonchi, symmetric air entry Heart -distant S1-S2 Abdomen - soft, nontender, nondistended, no masses or organomegaly Extremities - pedal edema 3-4+, marked clubbing of the fingers  Data Reviewed: Results for orders placed or performed during the hospital encounter of 03/17/24 (from the past 24 hours)  CBC with Differential     Status: Abnormal   Collection Time: 03/17/24  2:45 PM  Result Value Ref Range   WBC 6.9 4.0 - 10.5 K/uL   RBC 3.82 (L) 4.22 - 5.81 MIL/uL   Hemoglobin 8.7 (L) 13.0 - 17.0 g/dL   HCT 70.8 (L) 60.9 - 47.9 %   MCV 76.2 (L) 80.0 - 100.0 fL   MCH 22.8 (L) 26.0 - 34.0 pg   MCHC 29.9 (L) 30.0 - 36.0 g/dL   RDW 76.6 (H) 88.4 - 84.4 %   Platelets 107 (L) 150 - 400 K/uL   nRBC 0.3 (H) 0.0 - 0.2 %   Neutrophils Relative % 82 %   Neutro Abs 5.5 1.7 - 7.7 K/uL   Lymphocytes Relative 10 %   Lymphs Abs 0.7 0.7 - 4.0 K/uL   Monocytes Relative 8 %   Monocytes Absolute 0.6 0.1 - 1.0 K/uL   Eosinophils Relative 0 %   Eosinophils Absolute 0.0 0.0 - 0.5 K/uL   Basophils Relative 0 %   Basophils Absolute 0.0 0.0 - 0.1 K/uL   WBC  Morphology MORPHOLOGY UNREMARKABLE    RBC Morphology See Note    Smear Review Normal platelet morphology    Immature Granulocytes 0 %   Abs Immature Granulocytes 0.03 0.00 - 0.07 K/uL   Abs Granulocyte 5.5 1.5 - 6.5 K/uL   Schistocytes PRESENT    Burr Cells PRESENT    Polychromasia PRESENT   Comprehensive metabolic panel     Status:  Abnormal   Collection Time: 03/17/24  2:45 PM  Result Value Ref Range   Sodium 130 (L) 135 - 145 mmol/L   Potassium 5.0 3.5 - 5.1 mmol/L   Chloride 100 98 - 111 mmol/L   CO2 18 (L) 22 - 32 mmol/L   Glucose, Bld 119 (H) 70 - 99 mg/dL   BUN 37 (H) 8 - 23 mg/dL   Creatinine, Ser 7.60 (H) 0.61 - 1.24 mg/dL   Calcium  8.4 (L) 8.9 - 10.3 mg/dL   Total Protein 6.3 (L) 6.5 - 8.1 g/dL   Albumin 2.4 (L) 3.5 - 5.0 g/dL   AST 54 (H) 15 - 41 U/L   ALT 14 0 - 44 U/L   Alkaline Phosphatase 53 38 - 126 U/L   Total Bilirubin 3.5 (H) 0.0 - 1.2 mg/dL   GFR, Estimated 28 (L) >60 mL/min   Anion gap 12 5 - 15  Protime-INR     Status: Abnormal   Collection Time: 03/17/24  2:45 PM  Result Value Ref Range   Prothrombin Time 16.8 (H) 11.4 - 15.2 seconds   INR 1.3 (H) 0.8 - 1.2  Urinalysis, Routine w reflex microscopic -Urine, Clean Catch     Status: Abnormal   Collection Time: 03/17/24  2:46 PM  Result Value Ref Range   Color, Urine AMBER (A) YELLOW   APPearance HAZY (A) CLEAR   Specific Gravity, Urine 1.014 1.005 - 1.030   pH 5.0 5.0 - 8.0   Glucose, UA NEGATIVE NEGATIVE mg/dL   Hgb urine dipstick NEGATIVE NEGATIVE   Bilirubin Urine NEGATIVE NEGATIVE   Ketones, ur NEGATIVE NEGATIVE mg/dL   Protein, ur 30 (A) NEGATIVE mg/dL   Nitrite NEGATIVE NEGATIVE   Leukocytes,Ua LARGE (A) NEGATIVE   RBC / HPF 0-5 0 - 5 RBC/hpf   WBC, UA >50 0 - 5 WBC/hpf   Bacteria, UA RARE (A) NONE SEEN   Squamous Epithelial / HPF 0-5 0 - 5 /HPF   Mucus PRESENT   I-Stat CG4 Lactic Acid     Status: None   Collection Time: 03/17/24  3:02 PM  Result Value Ref Range   Lactic Acid, Venous 1.9  0.5 - 1.9 mmol/L  Brain natriuretic peptide     Status: Abnormal   Collection Time: 03/17/24  4:13 PM  Result Value Ref Range   B Natriuretic Peptide >4,500.0 (H) 0.0 - 100.0 pg/mL  Rapid urine drug screen (hospital performed)     Status: None   Collection Time: 03/17/24  4:30 PM  Result Value Ref Range   Opiates NONE DETECTED NONE DETECTED   Cocaine NONE DETECTED NONE DETECTED   Benzodiazepines NONE DETECTED NONE DETECTED   Amphetamines NONE DETECTED NONE DETECTED   Tetrahydrocannabinol NONE DETECTED NONE DETECTED   Barbiturates NONE DETECTED NONE DETECTED  POC occult blood, ED     Status: None   Collection Time: 03/17/24  5:10 PM  Result Value Ref Range   Fecal Occult Bld NEGATIVE NEGATIVE   DG Chest Portable 1 View Result Date: 03/17/2024 CLINICAL DATA:  dyspnea. EXAM: PORTABLE CHEST 1 VIEW COMPARISON:  02/10/2024. FINDINGS: Mild-to-moderate diffuse pulmonary vascular congestion with basilar gradient. There are bilateral small to trace pleural effusions. Probable atelectatic changes at the lung bases. Bilateral lung fields are clear. No dense consolidation or lung collapse. No pneumothorax. Stable mildly enlarged cardio-mediastinal silhouette. No acute osseous abnormalities. The soft tissues are within normal limits. IMPRESSION: Findings favor congestive heart failure/pulmonary edema. Electronically Signed   By: Ree Molt  M.D.   On: 03/17/2024 17:36   EKG reveals left bundle branch block Sinus rhythm  Assessment and Plan: * Chronic combined systolic and diastolic heart failure (HCC) IV Lasix  given in the ED Resume Demadex  Continue low-sodium diet Currently not hypoxic Currently not hypotensive  Acute kidney injury superimposed on chronic kidney disease (HCC) Serum creatinine is 2.39 today which is near his baseline. Avoid nephrotoxic agents Trend  Pancytopenia (HCC) Patient has normal white count but low hemoglobin at 8.7 up from 7.1 during his last  admission. Platelets are up at 107 improved from his baseline of 80s. Will hold heparin  and Lovenox  for SCD prophylaxis due to low platelet count. SCDs  Pure hypercholesterolemia Continue statin   Patient's caregivers state that they can no longer care for him at home and that he is not safe.   Advance Care Planning:   Code Status: Prior full, confirmed with patient.  He seemed upset that they made him DNR last time.  We did discuss that his heart may not survive any count of shocks or CPR and he is aware of that but still would like things to be pursued if able.  Consults: None  Family Communication: Patient at bedside  Severity of Illness: The appropriate patient status for this patient is OBSERVATION. Observation status is judged to be reasonable and necessary in order to provide the required intensity of service to ensure the patient's safety. The patient's presenting symptoms, physical exam findings, and initial radiographic and laboratory data in the context of their medical condition is felt to place them at decreased risk for further clinical deterioration. Furthermore, it is anticipated that the patient will be medically stable for discharge from the hospital within 2 midnights of admission.  Hopefully we able to diurese him quickly and get him off to a SNF.  Author: Glenys GORMAN Birk, MD 03/17/2024 6:33 PM  For on call review [email or web address].

## 2024-03-17 NOTE — ED Provider Notes (Signed)
  Physical Exam  BP (!) 122/90   Pulse 92   Temp 98 F (36.7 C) (Oral)   Resp 19   Ht 6' 3 (1.905 m)   Wt 65 kg   SpO2 100%   BMI 17.91 kg/m   Physical Exam Constitutional:      General: He is not in acute distress.    Appearance: Normal appearance.  HENT:     Head: Normocephalic and atraumatic.     Nose: No congestion or rhinorrhea.  Eyes:     General:        Right eye: No discharge.        Left eye: No discharge.     Extraocular Movements: Extraocular movements intact.     Pupils: Pupils are equal, round, and reactive to light.  Cardiovascular:     Rate and Rhythm: Normal rate and regular rhythm.     Heart sounds: No murmur heard. Pulmonary:     Effort: No respiratory distress.     Breath sounds: No wheezing or rales.  Abdominal:     General: There is no distension.     Tenderness: There is no abdominal tenderness.  Musculoskeletal:        General: Normal range of motion.     Cervical back: Normal range of motion.     Right lower leg: Edema present.     Left lower leg: Edema present.  Skin:    General: Skin is warm and dry.  Neurological:     General: No focal deficit present.     Mental Status: He is alert.     Procedures  Procedures  ED Course / MDM   Clinical Course as of 03/17/24 1804  Wed Mar 17, 2024  1511 Lactic Acid, Venous: 1.9 [HN]    Clinical Course User Index [HN] Franklyn Sid SAILOR, MD   Medical Decision Making Amount and/or Complexity of Data Reviewed Labs: ordered. Decision-making details documented in ED Course. Radiology: ordered.  Risk Prescription drug management. Decision regarding hospitalization.   Patient received in handoff.  Diarrhea, shortness of breath pending laboratory evaluation.  Laboratory valuation with chronic abnormalities, hemoglobin 8.7 with an MCV of 76.2, sodium 130, CO2 18, BUN 37, creatinine 2.39, albumin 2.4, total bili 3.5.  Chest x-ray with fluid overload and patient will require hospital admission for  failure of outpatient diuresis and fluid overload.  Patient admitted       Albertina Dixon, MD 03/17/24 2157

## 2024-03-17 NOTE — ED Notes (Signed)
 Pt given drink and [place] sandwich meal- okay per MD.

## 2024-03-17 NOTE — Assessment & Plan Note (Signed)
 Serum creatinine is 2.39 today which is near his baseline. Avoid nephrotoxic agents Trend

## 2024-03-17 NOTE — ED Notes (Signed)
Got patient into a gown on the monitor did EKG shown to er provider got patient some warm blankets patient is resting with call bell in reach

## 2024-03-17 NOTE — ED Notes (Signed)
 Family at bedside states concerns for patient to take care of self at home. MD aware.

## 2024-03-17 NOTE — Assessment & Plan Note (Addendum)
 Echocardiogram with reduced LV systolic function with EF 20 to 25%, global hypokinesis, severe dilatation LV cavity, mild LVH, RV systolic function with severe reduction, moderate enlarged RV cavity, LA with moderate dilatation and RA with severe dilatation, RVSP 45.9 mmHg. No significant valvular disease.   Urine output 3,350 ml  Systolic blood pressure 100 mmHg range   Continue with torsemide  80 mg po bid  Continue with midodrine  for blood pressure support.

## 2024-03-18 DIAGNOSIS — I2510 Atherosclerotic heart disease of native coronary artery without angina pectoris: Secondary | ICD-10-CM | POA: Diagnosis not present

## 2024-03-18 DIAGNOSIS — N184 Chronic kidney disease, stage 4 (severe): Secondary | ICD-10-CM | POA: Diagnosis not present

## 2024-03-18 DIAGNOSIS — E7800 Pure hypercholesterolemia, unspecified: Secondary | ICD-10-CM | POA: Diagnosis not present

## 2024-03-18 DIAGNOSIS — I5023 Acute on chronic systolic (congestive) heart failure: Secondary | ICD-10-CM | POA: Diagnosis not present

## 2024-03-18 LAB — COMPREHENSIVE METABOLIC PANEL WITH GFR
ALT: 13 U/L (ref 0–44)
AST: 28 U/L (ref 15–41)
Albumin: 2 g/dL — ABNORMAL LOW (ref 3.5–5.0)
Alkaline Phosphatase: 50 U/L (ref 38–126)
Anion gap: 10 (ref 5–15)
BUN: 38 mg/dL — ABNORMAL HIGH (ref 8–23)
CO2: 20 mmol/L — ABNORMAL LOW (ref 22–32)
Calcium: 8.3 mg/dL — ABNORMAL LOW (ref 8.9–10.3)
Chloride: 104 mmol/L (ref 98–111)
Creatinine, Ser: 2.5 mg/dL — ABNORMAL HIGH (ref 0.61–1.24)
GFR, Estimated: 26 mL/min — ABNORMAL LOW (ref >60)
Glucose, Bld: 116 mg/dL — ABNORMAL HIGH (ref 70–99)
Potassium: 3.4 mmol/L — ABNORMAL LOW (ref 3.5–5.1)
Sodium: 134 mmol/L — ABNORMAL LOW (ref 135–145)
Total Bilirubin: 2.8 mg/dL — ABNORMAL HIGH (ref 0.0–1.2)
Total Protein: 5.6 g/dL — ABNORMAL LOW (ref 6.5–8.1)

## 2024-03-18 LAB — CBC
HCT: 25.7 % — ABNORMAL LOW (ref 39.0–52.0)
Hemoglobin: 8 g/dL — ABNORMAL LOW (ref 13.0–17.0)
MCH: 23.3 pg — ABNORMAL LOW (ref 26.0–34.0)
MCHC: 31.1 g/dL (ref 30.0–36.0)
MCV: 74.9 fL — ABNORMAL LOW (ref 80.0–100.0)
Platelets: 114 K/uL — ABNORMAL LOW (ref 150–400)
RBC: 3.43 MIL/uL — ABNORMAL LOW (ref 4.22–5.81)
RDW: 22.8 % — ABNORMAL HIGH (ref 11.5–15.5)
WBC: 6.7 K/uL (ref 4.0–10.5)
nRBC: 0 % (ref 0.0–0.2)

## 2024-03-18 MED ORDER — POTASSIUM CHLORIDE CRYS ER 20 MEQ PO TBCR
40.0000 meq | EXTENDED_RELEASE_TABLET | Freq: Once | ORAL | Status: AC
  Administered 2024-03-18: 40 meq via ORAL
  Filled 2024-03-18: qty 2

## 2024-03-18 MED ORDER — MIDODRINE HCL 5 MG PO TABS
5.0000 mg | ORAL_TABLET | Freq: Three times a day (TID) | ORAL | Status: DC
  Administered 2024-03-18 – 2024-04-01 (×42): 5 mg via ORAL
  Filled 2024-03-18 (×43): qty 1

## 2024-03-18 MED ORDER — FUROSEMIDE 10 MG/ML IJ SOLN
80.0000 mg | Freq: Two times a day (BID) | INTRAMUSCULAR | Status: DC
  Administered 2024-03-18 – 2024-03-20 (×3): 80 mg via INTRAVENOUS
  Filled 2024-03-18 (×4): qty 8

## 2024-03-18 NOTE — Assessment & Plan Note (Signed)
Continue nutritional supplements. °

## 2024-03-18 NOTE — Progress Notes (Signed)
 Progress Note   Patient: Joseph Tanner FMW:[id number] DOB: [birth year] DOA: 03/17/2024     0 DOS: the patient was seen and examined on 03/18/2024   Brief hospital course: Mr. Markus was admitted to the hospital with the working diagnosis of heart failure exacerbation.   73 yo male with the past medical history of hypertension, dyslipidemia, heart failure, coronary artery disease, COPD, CKD and cocaine use who presented with worsening lower extremity edema and diarrhea.  Recent hospitalization 07/15 to 02/20/24 for heart failure exacerbation. He was determined to have end stage heart failure, and transitioned to palliative care. He was discharged to SNF. From rehab he was discharged to home because not able to participate in physical therapy.  At home he was not taking his medications and over the last 4 to 5 days prior to admission, developed worsening edema, dyspnea and lightheadedness. He had intermittent bloody stools. EMS was called and he was transported to the ED.  On his initial physical examination his blood pressure was 129/90, HR 96, RR 20 and 02 saturation 100% Lungs with no wheezing or rhonchi, heart with S1 and S2 present and rhythmic, abdomen with no distention and positive lower extremity edema +++.   Assessment and Plan: * Acute on chronic systolic CHF (congestive heart failure) (HCC) Echocardiogram with reduced LV systolic function with EF 20 to 25%, global hypokinesis, severe dilatation LV cavity, mild LVH, RV systolic function with severe reduction, moderate enlarged RV cavity, LA with moderate dilatation and RA with severe dilatation, RVSP 45.9 mmHg. No significant valvular disease.   Continue volume overloaded.  Systolic blood pressure 100 mmHg range   Plan to continue diuresis with furosemide  80 mg IV q12 hrs Add midodrine  for blood pressure support and allow diuresis.   Coronary artery disease No chest pain, continue blood pressure support and statin.   CKD  (chronic kidney disease) stage 4, GFR 15-29 ml/min (HCC) Hyponatremia, hypokalemia, (base serum cr 2,5)   Plan to continue diuresis with furosemide  80 mg IV bid Add 40 meq Kcl po Follow up renal function and electrolytes  Avoid hypotension and nephrotoxic medications   Pure hypercholesterolemia Continue statin  Pancytopenia (HCC) Continue DVT prophylaxis with heparin  sq  Follow up hgb is 8,0 with plt 114 and wbc 6.7   Protein-calorie malnutrition, severe Continue nutritional supplements   Cocaine abuse (HCC) Avoid cocaine, no acute intoxication       Subjective: Patient continue to have lower extremity edema, dyspnea has improved. No diarrhea today, very weak and deconditioned   Physical Exam: Vitals:   03/18/24 0510 03/18/24 0740 03/18/24 0809 03/18/24 1050  BP: (!) 88/64  (!) 109/92 100/72  Pulse: 85  65 82  Resp: 19  20 19   Temp: 98.3 F (36.8 C)  98.1 F (36.7 C) (!) 97.5 F (36.4 C)  TempSrc: Oral  Oral Oral  SpO2: 95% 95% 94% 95%  Weight: 64.7 kg     Height:       Neurology awake and alert, deconditioned and ill looking appearing ENT with mild pallor Cardiovascular with S1 and S2 present and regular with positive systolic murmur at the apex Positive moderate JVD Respiratory with rales at bases with no wheezing or rhonchi  Abdomen with no distention   Data Reviewed:    Family Communication: I spoke with patient's daughter over the phone at the bedside, we talked in detail about patient's condition, plan of care and prognosis and all questions were addressed.   Disposition: Status is: Observation The  patient will require care spanning > 2 midnights and should be moved to inpatient because: IV diuresis   Planned Discharge Destination: Home     Author: Elidia Toribio Furnace, MD 03/18/2024 3:33 PM  For on call review [email or web address].

## 2024-03-18 NOTE — Evaluation (Signed)
 Occupational Therapy Evaluation Patient Details Name: Joseph Tanner MRN: [id number] DOB: 11/07/1950 Today's Date: 03/18/2024   History of Present Illness   73 y.o. male presents to [facility] 02/10/24 with SOB. Chest x-ray concerning for aspiration or PNA, +UTI, + cocaine. PMHx: hypertension, hyperlipidemia, combined systolic and diastolic CHF, NSTEMI, CAD s/p PCI, COPD, CKD stage IV, and cocaine abuse. PT asked re-evalute if patient needs SNF on 7/22.     Clinical Impressions Pt admitted based on above, and was seen based on problem list below. Pt with recent d/c from snf < 1 week PTA. At baseline pt is mod I for ADLs and mobility with SPC. Today pt is close to functional baseline, at s for safety for most ADLs, min assist for socks d/t edema. Pt with decreased balance and activity tolerance limiting performance and safety at home. Pt normally lives home alone, and would benefit from [facility] and 24/7 supervision and assistance from family. If family unable to provide appropriate support, then pt should d/c and complete <3 hours of skilled rehab to reduce risk of falls and readmission. OT will continue to follow acutely to maximize functional independence.      If plan is discharge home, recommend the following:   Assistance with cooking/housework;A little help with walking and/or transfers;Assist for transportation;Direct supervision/assist for medications management     Functional Status Assessment   Patient has had a recent decline in their functional status and demonstrates the ability to make significant improvements in function in a reasonable and predictable amount of time.     Equipment Recommendations   BSC/3in1      Precautions/Restrictions   Precautions Precautions: Fall Recall of Precautions/Restrictions: Intact Restrictions Weight Bearing Restrictions Per Provider Order: No     Mobility Bed Mobility Overal bed mobility: Modified Independent     Transfers Overall transfer level: Needs assistance Equipment used: Straight cane Transfers: Sit to/from Stand Sit to Stand: Supervision           General transfer comment: S for safety      Balance Overall balance assessment: Mild deficits observed, not formally tested         ADL either performed or assessed with clinical judgement   ADL Overall ADL's : Needs assistance/impaired Eating/Feeding: Set up;Sitting   Grooming: Wash/dry hands;Supervision/safety;Standing   Upper Body Bathing: Set up;Sitting   Lower Body Bathing: Supervison/ safety;Sit to/from stand   Upper Body Dressing : Set up;Sitting   Lower Body Dressing: Minimal assistance;Sit to/from stand Lower Body Dressing Details (indicate cue type and reason): Assist for socks Toilet Transfer: Supervision/safety;Ambulation;Regular Teacher, adult education Details (indicate cue type and reason): Use of SPC Toileting- Clothing Manipulation and Hygiene: Supervision/safety;Sit to/from stand       Functional mobility during ADLs: Supervision/safety;Cane General ADL Comments: Close to functional baseline, mild balance deficits     Vision Baseline Vision/History: 0 No visual deficits Patient Visual Report: No change from baseline Vision Assessment?: No apparent visual deficits            Pertinent Vitals/Pain Pain Assessment Pain Assessment: No/denies pain     Extremity/Trunk Assessment Upper Extremity Assessment Upper Extremity Assessment: Generalized weakness   Lower Extremity Assessment Lower Extremity Assessment: Defer to PT evaluation   Cervical / Trunk Assessment Cervical / Trunk Assessment: Normal   Communication Communication Communication: No apparent difficulties   Cognition Arousal: Alert Behavior During Therapy: WFL for tasks assessed/performed Cognition: No apparent impairments       Following commands: Intact  Cueing  General Comments   Cueing Techniques: Verbal  cues  VSS on RA           Home Living Family/patient expects to be discharged to:: Private residence Living Arrangements: Alone Available Help at Discharge: Family;Available PRN/intermittently Type of Home: Apartment Home Access: Elevator;Stairs to enter Entrance Stairs-Number of Steps: 14/1 flight-only if elevator out of service Entrance Stairs-Rails: Can reach both Home Layout: One level     Bathroom Shower/Tub: Producer, television/film/video: Standard Bathroom Accessibility: Yes How Accessible: Accessible via walker Home Equipment: Cane - single point;Rolling Walker (2 wheels)   Additional Comments: Pt recently d/c from snf <1 week ago      Prior Functioning/Environment Prior Level of Function : Independent/Modified Independent             Mobility Comments: Mod I, use of SPC ADLs Comments: mod I    OT Problem List: Decreased activity tolerance;Decreased strength;Impaired balance (sitting and/or standing);Decreased knowledge of use of DME or AE;Cardiopulmonary status limiting activity   OT Treatment/Interventions: Self-care/ADL training;Therapeutic exercise;DME and/or AE instruction;Energy conservation;Therapeutic activities;Balance training;Patient/family education      OT Goals(Current goals can be found in the care plan section)   Acute Rehab OT Goals Patient Stated Goal: To get better OT Goal Formulation: With patient Time For Goal Achievement: 04/01/24 Potential to Achieve Goals: Good   OT Frequency:  Min 1X/week       AM-PAC OT 6 Clicks Daily Activity     Outcome Measure Help from another person eating meals?: None Help from another person taking care of personal grooming?: A Little Help from another person toileting, which includes using toliet, bedpan, or urinal?: A Little Help from another person bathing (including washing, rinsing, drying)?: A Little Help from another person to put on and taking off regular upper body clothing?: A  Little Help from another person to put on and taking off regular lower body clothing?: A Little 6 Click Score: 19   End of Session Equipment Utilized During Treatment: Other (comment) [facility]) Nurse Communication: Mobility status  Activity Tolerance: Patient tolerated treatment well Patient left: in chair;with call bell/phone within reach;with chair alarm set  OT Visit Diagnosis: Unsteadiness on feet (R26.81);Other abnormalities of gait and mobility (R26.89);Muscle weakness (generalized) (M62.81)                Time: 8389-8370 OT Time Calculation (min): 19 min Charges:  OT General Charges $OT Visit: 1 Visit OT Evaluation $OT Eval Moderate Complexity: 1 Mod  Adrianne BROCKS, OT  Acute Rehabilitation Services Office [phone] Secure chat preferred   Adrianne GORMAN Savers 03/18/2024, 4:40 PM

## 2024-03-18 NOTE — Progress Notes (Addendum)
 Patient family expresses concern for discharge disposition; on various occassions both daughter Young) and wife Huel) have made wellness checks and found the patient in unsanitary conditions with waxing and waning mentality. It is their wish to set up a health care power of attorney and evaluate for SNF placement given the above.  Per verbal conversation with patient, Freida may receive updates on patient condition up to and including discharge disposition, current condition, and deterioration. Patient has to this RN's knowledge not been deemed incompetent to make medical decisions.

## 2024-03-18 NOTE — Hospital Course (Addendum)
 Mr. Dauphinais was admitted to the hospital with the working diagnosis of heart failure exacerbation.   73 yo male with the past medical history of hypertension, dyslipidemia, heart failure, coronary artery disease, COPD, CKD and cocaine use who presented with worsening lower extremity edema and diarrhea.  Recent hospitalization 07/15 to 02/20/24 for heart failure exacerbation. He was determined to have end stage heart failure, and transitioned to palliative care. He was discharged to SNF. From rehab he was discharged to home because not able to participate in physical therapy.  At home he was not taking his medications and over the last 4 to 5 days prior to admission, developed worsening edema, dyspnea and lightheadedness. He had intermittent bloody stools. EMS was called and he was transported to the ED.  On his initial physical examination his blood pressure was 129/90, HR 96, RR 20 and 02 saturation 100% Lungs with no wheezing or rhonchi, heart with S1 and S2 present and rhythmic, abdomen with no distention and positive lower extremity edema +++.   Na 134, K 34, Cl 104 bicarbonate 20 glucose 116 bun 38 cr 2,50  AST 28 ALT 13  Wbc 6,7 hgb 8,0 plt 114   Urine analysis SG 1.014, protein 30, negative nitrates, large leukocytes, negative hgb  > 50 wbc, 0-5 rbc  Urine culture positive for E coli   Chest radiograph with cardiomegaly, bilateral hilar vascular congestion with no infiltrates or effusions,   EKG 97 bpm, left axis, normal intervals, qtc 493, sinus rhythm with no significant ST segment changes, negative T wave V4 to V6, positive LVH.   08/23 responding well to diuresis, transitioned to po loop diuretic, plan to transfer to SNF  08/25 medically  stable for SNF  08/26 pending insurance authorization for SNF.

## 2024-03-18 NOTE — Care Management Obs Status (Signed)
 MEDICARE OBSERVATION STATUS NOTIFICATION   Patient Details  Name: Joseph Tanner MRN: [id number] Date of Birth: [birth year]   Medicare Observation Status Notification Given:  Yes    [name] 03/18/2024, 9:20 AM

## 2024-03-18 NOTE — Assessment & Plan Note (Addendum)
 Hyponatremia, hypokalemia, hypomagnesemia. hypoP  (base serum cr 2,5)   Renal function with serum cr at 2,15 with K at 3,7 and serum bicarbonate at 30  Na 132 and Mg 1.6   Patient had Kcl, Mg and Nphos supplementation with good toleration.  Continue diuresis with torsemide  80 mg po daily  Follow up renal function and electrolytes regularly  Avoid hypotension and nephrotoxic medications

## 2024-03-18 NOTE — Plan of Care (Signed)
   Problem: Clinical Measurements: Goal: Ability to maintain clinical measurements within normal limits will improve Outcome: Progressing

## 2024-03-18 NOTE — Assessment & Plan Note (Signed)
 No chest pain, continue blood pressure support and statin.

## 2024-03-18 NOTE — Assessment & Plan Note (Signed)
 Avoid cocaine, no acute intoxication

## 2024-03-18 NOTE — TOC Initial Note (Signed)
 Transition of Care [facility]) - Initial/Assessment Note    Patient Details  Name: Joseph Tanner MRN: [id number] Date of Birth: 05-24-1951  Transition of [facility]) CM/SW Contact:    [name], LCSWA Phone Number: 03/18/2024, 3:06 PM  Clinical Narrative:   CSW introduced self/role to patient at bedside. CSW inquired about who patient would like staff to speak with regarding discharge planning and other updates. Patient, who is alert and oriented x4, stated he would like staff to speak with his wife, [name] [phone]) and gave permission for staff to speak with his daughter, Harlene [phone]) as well.   CSW spoke with Harlene, per her request, and she inquired about LTC for patient. CSW informed Harlene that patient would need to apply for LTC medicaid. Harlene would like to see if patient is eligible for medicaid, CSW will reach out to financial counseling. CSW to provide Harlene with other community supports that can assist in finding other placement avenues. CSW expressed to Harlene that patient is oriented x4 and can make his own decisions. Harlene expressed her understanding.   Patient is from home alone, but daughters help assist him with transportation. Patient has a PCP and utilizes Psychologist, forensic. Patient has the following DME: cane, walker. Patient has hx of SNF and HH.   CSW/RNCM will continue to follow.   Expected Discharge Plan:  (TBD) Barriers to Discharge: Continued Medical Work up   Patient Goals and CMS Choice Patient states their goals for this hospitalization and ongoing recovery are:: To go home          Expected Discharge Plan and Services In-house Referral: Clinical Social Work, Artist     Living arrangements for the past 2 months: Single Family Home                                      Prior Living Arrangements/Services Living arrangements for the past 2 months: Single Family Home Lives with:: Self Patient  language and need for interpreter reviewed:: Yes Do you feel safe going back to the place where you live?: Yes      Need for Family Participation in Patient Care: Yes (Comment) Care giver support system in place?: Yes (comment) Current home services: DME (scale, cane) Criminal Activity/Legal Involvement Pertinent to Current Situation/Hospitalization: No - Comment as needed  Activities of Daily Living      Permission Sought/Granted Permission sought to share information with : Family Supports Permission granted to share information with : Yes, Verbal Permission Granted  Share Information with NAME: Harlene [name]     Permission granted to share info w Relationship: Daughter, wife  Permission granted to share info w Contact Information: [phone], [phone]  Emotional Assessment Appearance:: Appears stated age Attitude/Demeanor/Rapport: Engaged Affect (typically observed): Pleasant Orientation: : Oriented to Situation, Oriented to  Time, Oriented to Place, Oriented to Self Alcohol / Substance Use: Not Applicable Psych Involvement: No (comment)  Admission diagnosis:  CHF (congestive heart failure) (HCC) [I50.9] Other hypervolemia [E87.79] Patient Active Problem List   Diagnosis Date Noted   Cardiogenic shock (HCC) 02/12/2024   CAP (community acquired pneumonia) 02/10/2024   Urinary tract infection 02/10/2024   Lactic acidosis 02/10/2024   Normal anion gap metabolic acidosis 02/10/2024   Elevated troponin 02/10/2024   Hyperkalemia 02/10/2024   Pancytopenia (HCC) 02/10/2024   Hypomagnesemia 02/10/2024   Hypotension 06/17/2023   Cocaine abuse (HCC) 06/17/2023   Acute on  chronic combined systolic and diastolic CHF (congestive heart failure) (HCC) 06/17/2023   Acute kidney injury superimposed on chronic kidney disease (HCC) 06/14/2023   Pure hypercholesterolemia 03/15/2021   Demand ischemia (HCC) 11/21/2020   Coronary artery disease 11/21/2020   CKD (chronic kidney  disease) stage 3, GFR 30-59 ml/min (HCC) 11/21/2020   Nephrolithiasis 11/21/2020   Pyuria 11/21/2020   Protein-calorie malnutrition, severe 11/21/2020   Acute on chronic systolic CHF (congestive heart failure) (HCC) 11/20/2020   NSTEMI (non-ST elevated myocardial infarction) (HCC) 10/14/2020   PCP:  Freddrick No Pharmacy:   [id number] - [place], [place] - [address] [place] [address] Phone: [phone] Fax: [phone]     Social Drivers of Health (SDOH) Social History: SDOH Screenings   Food Insecurity: No Food Insecurity (03/18/2024)  Housing: Unknown (03/18/2024)  Transportation Needs: No Transportation Needs (03/18/2024)  Utilities: Not At Risk (03/18/2024)  Alcohol Screen: Low Risk  (06/17/2023)  Financial Resource Strain: Low Risk  (06/17/2023)  Social Connections: Moderately Isolated (03/18/2024)  Tobacco Use: High Risk (02/13/2024)   SDOH Interventions:     Readmission Risk Interventions    02/11/2024   11:47 AM  Readmission Risk Prevention Plan  Transportation Screening Complete  PCP or Specialist Appt within 5-7 Days Complete  Home Care Screening Complete  Medication Review (RN CM) Complete

## 2024-03-19 DIAGNOSIS — R531 Weakness: Secondary | ICD-10-CM | POA: Diagnosis not present

## 2024-03-19 DIAGNOSIS — I5023 Acute on chronic systolic (congestive) heart failure: Secondary | ICD-10-CM

## 2024-03-19 DIAGNOSIS — R32 Unspecified urinary incontinence: Secondary | ICD-10-CM | POA: Diagnosis present

## 2024-03-19 DIAGNOSIS — J449 Chronic obstructive pulmonary disease, unspecified: Secondary | ICD-10-CM | POA: Diagnosis not present

## 2024-03-19 DIAGNOSIS — N184 Chronic kidney disease, stage 4 (severe): Secondary | ICD-10-CM

## 2024-03-19 DIAGNOSIS — I509 Heart failure, unspecified: Secondary | ICD-10-CM | POA: Diagnosis present

## 2024-03-19 DIAGNOSIS — F1410 Cocaine abuse, uncomplicated: Secondary | ICD-10-CM | POA: Diagnosis present

## 2024-03-19 DIAGNOSIS — D61818 Other pancytopenia: Secondary | ICD-10-CM | POA: Diagnosis not present

## 2024-03-19 DIAGNOSIS — B952 Enterococcus as the cause of diseases classified elsewhere: Secondary | ICD-10-CM | POA: Diagnosis present

## 2024-03-19 DIAGNOSIS — I5082 Biventricular heart failure: Secondary | ICD-10-CM | POA: Diagnosis present

## 2024-03-19 DIAGNOSIS — R197 Diarrhea, unspecified: Secondary | ICD-10-CM | POA: Diagnosis present

## 2024-03-19 DIAGNOSIS — J810 Acute pulmonary edema: Secondary | ICD-10-CM | POA: Diagnosis not present

## 2024-03-19 DIAGNOSIS — I5043 Acute on chronic combined systolic (congestive) and diastolic (congestive) heart failure: Secondary | ICD-10-CM | POA: Diagnosis not present

## 2024-03-19 DIAGNOSIS — Z681 Body mass index (BMI) 19 or less, adult: Secondary | ICD-10-CM | POA: Diagnosis not present

## 2024-03-19 DIAGNOSIS — I472 Ventricular tachycardia, unspecified: Secondary | ICD-10-CM | POA: Diagnosis not present

## 2024-03-19 DIAGNOSIS — N390 Urinary tract infection, site not specified: Secondary | ICD-10-CM | POA: Diagnosis present

## 2024-03-19 DIAGNOSIS — Z66 Do not resuscitate: Secondary | ICD-10-CM | POA: Diagnosis present

## 2024-03-19 DIAGNOSIS — Z7401 Bed confinement status: Secondary | ICD-10-CM | POA: Diagnosis not present

## 2024-03-19 DIAGNOSIS — E7800 Pure hypercholesterolemia, unspecified: Secondary | ICD-10-CM

## 2024-03-19 DIAGNOSIS — I429 Cardiomyopathy, unspecified: Secondary | ICD-10-CM | POA: Diagnosis present

## 2024-03-19 DIAGNOSIS — I2510 Atherosclerotic heart disease of native coronary artery without angina pectoris: Secondary | ICD-10-CM

## 2024-03-19 DIAGNOSIS — B9620 Unspecified Escherichia coli [E. coli] as the cause of diseases classified elsewhere: Secondary | ICD-10-CM | POA: Diagnosis present

## 2024-03-19 DIAGNOSIS — N179 Acute kidney failure, unspecified: Secondary | ICD-10-CM | POA: Diagnosis present

## 2024-03-19 DIAGNOSIS — E871 Hypo-osmolality and hyponatremia: Secondary | ICD-10-CM | POA: Diagnosis not present

## 2024-03-19 DIAGNOSIS — I5084 End stage heart failure: Secondary | ICD-10-CM | POA: Diagnosis present

## 2024-03-19 DIAGNOSIS — E43 Unspecified severe protein-calorie malnutrition: Secondary | ICD-10-CM

## 2024-03-19 DIAGNOSIS — E8809 Other disorders of plasma-protein metabolism, not elsewhere classified: Secondary | ICD-10-CM | POA: Diagnosis not present

## 2024-03-19 DIAGNOSIS — M6281 Muscle weakness (generalized): Secondary | ICD-10-CM | POA: Diagnosis not present

## 2024-03-19 DIAGNOSIS — I130 Hypertensive heart and chronic kidney disease with heart failure and stage 1 through stage 4 chronic kidney disease, or unspecified chronic kidney disease: Secondary | ICD-10-CM | POA: Diagnosis present

## 2024-03-19 DIAGNOSIS — K921 Melena: Secondary | ICD-10-CM | POA: Diagnosis present

## 2024-03-19 DIAGNOSIS — E88A Wasting disease (syndrome) due to underlying condition: Secondary | ICD-10-CM | POA: Diagnosis present

## 2024-03-19 LAB — BASIC METABOLIC PANEL WITH GFR
Anion gap: 15 (ref 5–15)
BUN: 36 mg/dL — ABNORMAL HIGH (ref 8–23)
CO2: 23 mmol/L (ref 22–32)
Calcium: 8.3 mg/dL — ABNORMAL LOW (ref 8.9–10.3)
Chloride: 103 mmol/L (ref 98–111)
Creatinine, Ser: 2.24 mg/dL — ABNORMAL HIGH (ref 0.61–1.24)
GFR, Estimated: 30 mL/min — ABNORMAL LOW (ref >60)
Glucose, Bld: 115 mg/dL — ABNORMAL HIGH (ref 70–99)
Potassium: 3.4 mmol/L — ABNORMAL LOW (ref 3.5–5.1)
Sodium: 141 mmol/L (ref 135–145)

## 2024-03-19 LAB — MAGNESIUM: Magnesium: 1.5 mg/dL — ABNORMAL LOW (ref 1.7–2.4)

## 2024-03-19 MED ORDER — CEPHALEXIN 500 MG PO CAPS
500.0000 mg | ORAL_CAPSULE | Freq: Two times a day (BID) | ORAL | Status: DC
  Administered 2024-03-19 – 2024-03-20 (×2): 500 mg via ORAL
  Filled 2024-03-19 (×3): qty 1

## 2024-03-19 MED ORDER — SODIUM CHLORIDE 0.9% FLUSH: 10.0000 mL | INTRAVENOUS | Status: DC | PRN

## 2024-03-19 MED ORDER — SODIUM CHLORIDE 0.9% FLUSH
10.0000 mL | Freq: Two times a day (BID) | INTRAVENOUS | Status: DC
  Administered 2024-03-19 – 2024-04-01 (×24): 10 mL

## 2024-03-19 MED ORDER — MAGNESIUM SULFATE 2 GM/50ML IV SOLN
2.0000 g | Freq: Once | INTRAVENOUS | Status: AC
  Administered 2024-03-19: 2 g via INTRAVENOUS
  Filled 2024-03-19: qty 50

## 2024-03-19 MED ORDER — POTASSIUM CHLORIDE CRYS ER 20 MEQ PO TBCR
40.0000 meq | EXTENDED_RELEASE_TABLET | Freq: Once | ORAL | Status: AC
  Administered 2024-03-19: 40 meq via ORAL
  Filled 2024-03-19: qty 2

## 2024-03-19 NOTE — TOC Progression Note (Signed)
 Transition of Care [facility]) - Progression Note    Patient Details  Name: Joseph Tanner MRN: [id number] Date of Birth: 05/20/1951  Transition of Care [facility]) CM/SW Contact  [name], [place] Phone Number: 03/19/2024, 2:01 PM  Clinical Narrative:   CSW spoke with patient at bedside about PT recs for SNF. Patient is oriented 4x and is agreeable to going to SNF at this time. CSW explained insurance authorization process. Patient stated if authorization is not approved he is agreeable to going home with home health.   CSW called Karlene, pts spouse, to discuss HCPOA/POA, LTC, and home care. CSW explained that incompetency is determined through the  court system and POA is not established within the hospital. Karlene inquired about what the Chaplain can help her establish, CSW informed her that the Chaplain can assist with HCPOA. Freida asked for the chaplain to be reached out to, CSW notified MD. CSW informed Karlene that HCPOA only comes into place when patient is deemed to not have decision making capacity, at this time patient is alert and oriented x4 and has not been deemed to not have decision making capacity to this CSWs knowledge. Karlene stated that patient stated herself/family can make decisions on his behalf. CSW informed Karlene that CSW still has to address decision making with patient. CSW given permission to discuss information with Freida on 8/21 from patient.   Karlene inquired about LTC and custodial care needs. CSW informed Karlene that insurance does not pay for LTC at a facility and personal care services for the house would be private pay. CSW informed Karlene that patient has only agreed to short term rehab at this time.   CSW will continue to follow.    Expected Discharge Plan: Skilled Nursing Facility Barriers to Discharge: Continued Medical Work up               Expected Discharge Plan and Services In-house Referral: Clinical Social Work, Artist      Living arrangements for the past 2 months: Single Family Home                                       Social Drivers of Health (SDOH) Interventions SDOH Screenings   Food Insecurity: No Food Insecurity (03/18/2024)  Housing: Unknown (03/18/2024)  Transportation Needs: No Transportation Needs (03/18/2024)  Utilities: Not At Risk (03/18/2024)  Alcohol Screen: Low Risk  (06/17/2023)  Financial Resource Strain: Low Risk  (06/17/2023)  Social Connections: Moderately Isolated (03/18/2024)  Tobacco Use: High Risk (02/13/2024)    Readmission Risk Interventions    02/11/2024   11:47 AM  Readmission Risk Prevention Plan  Transportation Screening Complete  PCP or Specialist Appt within 5-7 Days Complete  Home Care Screening Complete  Medication Review (RN CM) Complete

## 2024-03-19 NOTE — Progress Notes (Addendum)
 Progress Note   Patient: Joseph Tanner FMW:[id number] DOB: 11-20-1950 DOA: 03/17/2024     0 DOS: the patient was seen and examined on 03/19/2024   Brief hospital course: Joseph Tanner was admitted to the hospital with the working diagnosis of heart failure exacerbation.   73 yo male with the past medical history of hypertension, dyslipidemia, heart failure, coronary artery disease, COPD, CKD and cocaine use who presented with worsening lower extremity edema and diarrhea.  Recent hospitalization 07/15 to 02/20/24 for heart failure exacerbation. He was determined to have end stage heart failure, and transitioned to palliative care. He was discharged to SNF. From rehab he was discharged to home because not able to participate in physical therapy.  At home he was not taking his medications and over the last 4 to 5 days prior to admission, developed worsening edema, dyspnea and lightheadedness. He had intermittent bloody stools. EMS was called and he was transported to the ED.  On his initial physical examination his blood pressure was 129/90, HR 96, RR 20 and 02 saturation 100% Lungs with no wheezing or rhonchi, heart with S1 and S2 present and rhythmic, abdomen with no distention and positive lower extremity edema +++.   Na 134, K 34, Cl 104 bicarbonate 20 glucose 116 bun 38 cr 2,50  AST 28 ALT 13  Wbc 6,7 hgb 8,0 plt 114   Urine analysis SG 1.014, protein 30, negative nitrates, large leukocytes, negative hgb  > 50 wbc, 0-5 rbc  Urine culture positive for E coli   Chest radiograph with cardiomegaly, bilateral hilar vascular congestion with no infiltrates or effusions,   EKG 97 bpm, left axis, normal intervals, qtc 493, sinus rhythm with no significant ST segment changes, negative T wave V4 to V6, positive LVH.   Assessment and Plan: * Acute on chronic systolic CHF (congestive heart failure) (HCC) Echocardiogram with reduced LV systolic function with EF 20 to 25%, global hypokinesis,  severe dilatation LV cavity, mild LVH, RV systolic function with severe reduction, moderate enlarged RV cavity, LA with moderate dilatation and RA with severe dilatation, RVSP 45.9 mmHg. No significant valvular disease.   Urine output 1,850 ml  Systolic blood pressure 100 mmHg range   Continue diuresis with furosemide  80 mg IV q12 hrs Continue with midodrine  for blood pressure support and allow diuresis.   Coronary artery disease No chest pain, continue blood pressure support and statin.   CKD (chronic kidney disease) stage 4, GFR 15-29 ml/min (HCC) Hyponatremia, hypokalemia, hypomagnesemia.  (base serum cr 2,5)   Renal function with serum cr at 2,24 with K at 3,4 and serum bicarbonate at 23  Na 141 and Mg 1.5   Add 40 meq Kcl and 4 g mag sulfate   Follow up renal function and electrolytes  Avoid hypotension and nephrotoxic medications   Pure hypercholesterolemia Continue statin  Pancytopenia (HCC) Continue DVT prophylaxis with heparin  sq  Follow up hgb is 8,0 with plt 114 and wbc 6.7   Protein-calorie malnutrition, severe Continue nutritional supplements   Cocaine abuse (HCC) Avoid cocaine, no acute intoxication   UTI (urinary tract infection) Positive urine culture and positive pyuria on urine analysis Will start patient on cephalexin  and follow up on sensitivities.         Subjective: patient is feeling better, dyspnea is improving along with edema with no chest pain   Physical Exam: Vitals:   03/18/24 1941 03/19/24 0030 03/19/24 0300 03/19/24 0739  BP: (!) 107/94 92/60 93/68  112/77  Pulse: ROLLEN)  104 91 94 (!) 103  Resp: 19 18 19 18   Temp: (!) 97.5 F (36.4 C) 98.3 F (36.8 C) 98.3 F (36.8 C) 98.1 F (36.7 C)  TempSrc: Oral Oral Oral Oral  SpO2: 100% 97% 100% 95%  Weight:   61.4 kg   Height:       Neurology awake and alert ENT with mild pallor Cardiovascular with S1 and S2 present and regular with no gallops or rubs, no murmurs Moderate  JVD Respiratory with mild rales with no wheezing or rhonchi  Abdomen with no distention, non tender Positive lower extremity edema   Data Reviewed:    Family Communication: no family at the bedside   Disposition: Status is: Inpatient Remains inpatient appropriate because: diuresis   Planned Discharge Destination: Skilled nursing facility     Author: Elidia Toribio Furnace, MD 03/19/2024 3:18 PM  For on call review [email or web address].

## 2024-03-19 NOTE — Assessment & Plan Note (Addendum)
 Positive urine culture and positive pyuria on urine analysis Urine culture positive for enterococcus fecalis.  Sensitive to ampicillin, will change antibiotic therapy to Augmentin 

## 2024-03-19 NOTE — Progress Notes (Signed)
 Heart Failure Navigator Progress Note  Assessed for Heart & Vascular TOC clinic readiness.  Per Dr. Noralee No HF TOC, will reach out to get a [facility] appointment scheduled. Advanced Heart Failure Team patient is not a candidate for Advanced Heart Failure therapies. .   Navigator will sign off at this time.   Stephane Haddock, BSN, Scientist, clinical (histocompatibility and immunogenetics) Only

## 2024-03-19 NOTE — Progress Notes (Signed)

## 2024-03-19 NOTE — Progress Notes (Signed)
 Patient refused AM lasix  stating that he would like to take a break from it. Securechat sent to AM attending.

## 2024-03-19 NOTE — [place] FL2 (Signed)
 [place]  MEDICAID FL2 LEVEL OF CARE FORM     IDENTIFICATION  Patient Name: Joseph Tanner Birthdate: 1951/01/12 Sex: male Admission Date (Current Location): 03/17/2024  [facility] and [place] Number:  Producer, television/film/video and Address:  [facility] [place], [address], [place], [place] [address]      Provider Number: [id number]  Attending Physician Name and Address:  Noralee Elidia Sieving,*  Relative Name and Phone Number:  Caracci,Freida  [phone]    Current Level of Care: Hospital Recommended Level of Care: Skilled Nursing Facility Prior Approval Number:    Date Approved/Denied:   PASRR Number: [id number]  Discharge Plan: SNF    Current Diagnoses: Patient Active Problem List   Diagnosis Date Noted   Heart failure (HCC) 03/19/2024   Cardiogenic shock (HCC) 02/12/2024   CAP (community acquired pneumonia) 02/10/2024   Urinary tract infection 02/10/2024   Lactic acidosis 02/10/2024   Normal anion gap metabolic acidosis 02/10/2024   Elevated troponin 02/10/2024   Hyperkalemia 02/10/2024   Pancytopenia (HCC) 02/10/2024   Hypomagnesemia 02/10/2024   Hypotension 06/17/2023   Cocaine abuse (HCC) 06/17/2023   Acute on chronic combined systolic and diastolic CHF (congestive heart failure) (HCC) 06/17/2023   Acute kidney injury superimposed on chronic kidney disease (HCC) 06/14/2023   Pure hypercholesterolemia 03/15/2021   Demand ischemia (HCC) 11/21/2020   Coronary artery disease 11/21/2020   CKD (chronic kidney disease) stage 4, GFR 15-29 ml/min (HCC) 11/21/2020   Nephrolithiasis 11/21/2020   Pyuria 11/21/2020   Protein-calorie malnutrition, severe 11/21/2020   Acute on chronic systolic CHF (congestive heart failure) (HCC) 11/20/2020   NSTEMI (non-ST elevated myocardial infarction) (HCC) 10/14/2020    Orientation RESPIRATION BLADDER Height & Weight     Self, Time, Situation, Place  Normal Incontinent Weight: 135 lb 5.8 oz (61.4  kg) Height:  6' 3 (190.5 cm)  BEHAVIORAL SYMPTOMS/MOOD NEUROLOGICAL BOWEL NUTRITION STATUS      Continent Diet (see discharge summary)  AMBULATORY STATUS COMMUNICATION OF NEEDS Skin   Limited Assist Verbally Normal                       Personal Care Assistance Level of Assistance  Bathing, Dressing, Feeding Bathing Assistance: Limited assistance Feeding assistance: Limited assistance Dressing Assistance: Limited assistance     Functional Limitations Info  Sight, Hearing, Speech Sight Info: Adequate Hearing Info: Adequate Speech Info: Adequate    SPECIAL CARE FACTORS FREQUENCY  PT (By licensed PT), OT (By licensed OT)     PT Frequency: 5x week OT Frequency: 5x week            Contractures Contractures Info: Not present    Additional Factors Info  Code Status, Allergies Code Status Info: Full Allergies Info: NKA           Current Medications (03/19/2024):  This is the current hospital active medication list Current Facility-Administered Medications  Medication Dose Route Frequency Provider Last Rate Last Admin   acetaminophen  (TYLENOL ) tablet 650 mg  650 mg Oral Q6H PRN Pratt, Tanya S, MD       Or   acetaminophen  (TYLENOL ) suppository 650 mg  650 mg Rectal Q6H PRN Pratt, Tanya S, MD       albuterol  (PROVENTIL ) (2.5 MG/3ML) 0.083% nebulizer solution 2.5 mg  2.5 mg Inhalation Q6H PRN Pratt, Tanya S, MD       atorvastatin  (LIPITOR) tablet 40 mg  40 mg Oral QPM Pratt, Tanya S, MD   40 mg  at 03/18/24 1600   feeding supplement (ENSURE PLUS HIGH PROTEIN) liquid 237 mL  237 mL Oral TID BM Pratt, Tanya S, MD   237 mL at 03/19/24 1426   folic acid  (FOLVITE ) tablet 1 mg  1 mg Oral Daily Pratt, Tanya S, MD   1 mg at 03/19/24 9193   furosemide  (LASIX ) injection 80 mg  80 mg Intravenous Q12H Arrien, Mauricio Daniel, MD   80 mg at 03/18/24 1600   melatonin tablet 5 mg  5 mg Oral QHS PRN Pratt, Tanya S, MD       midodrine  (PROAMATINE ) tablet 5 mg  5 mg Oral TID WC Arrien,  Mauricio Daniel, MD   5 mg at 03/19/24 1207   multivitamin with minerals tablet 1 tablet  1 tablet Oral Daily Pratt, Tanya S, MD   1 tablet at 03/19/24 9193   polyethylene glycol (MIRALAX  / GLYCOLAX ) packet 17 g  17 g Oral Daily PRN Pratt, Tanya S, MD       sodium chloride  flush (NS) 0.9 % injection 10-40 mL  10-40 mL Intracatheter Q12H Arrien, Elidia Sieving, MD   10 mL at 03/19/24 9188   sodium chloride  flush (NS) 0.9 % injection 10-40 mL  10-40 mL Intracatheter PRN Arrien, Elidia Sieving, MD       tamsulosin  (FLOMAX ) capsule 0.4 mg  0.4 mg Oral QPC breakfast Pratt, Tanya S, MD   0.4 mg at 03/19/24 9193   thiamine  (VITAMIN B1) tablet 100 mg  100 mg Oral Daily Pratt, Tanya S, MD   100 mg at 03/19/24 9193   traZODone  (DESYREL ) tablet 50 mg  50 mg Oral QHS Pratt, Tanya S, MD   50 mg at 03/18/24 2338   umeclidinium bromide  (INCRUSE ELLIPTA ) 62.5 MCG/ACT 1 puff  1 puff Inhalation Daily Fredirick Glenys RAMAN, MD   1 puff at 03/19/24 0806     Discharge Medications: Please see discharge summary for a list of discharge medications.  Relevant Imaging Results:  Relevant Lab Results:   Additional Information SSN [id number]  [name], LCSWA

## 2024-03-19 NOTE — Progress Notes (Signed)
 Physical Therapy Evaluation Patient Details Name: Joseph Tanner MRN: [id number] DOB: 12/23/1950 Today's Date: 03/19/2024  History of Present Illness  73 y.o. male presents to [facility] 02/10/24 with SOB. Chest x-ray concerning for aspiration or PNA, +UTI, + cocaine. PMHx: hypertension, hyperlipidemia, combined systolic and diastolic CHF, NSTEMI, CAD s/p PCI, COPD, CKD stage IV, and cocaine abuse. PT asked re-evalute if patient needs SNF on 7/22.  Clinical Impression  Pt admitted with/for SOB, Chest CT suspect for aspiration or PNA.  Pt currently at supervision to CGA level.  Pt currently limited functionally due to the problems listed. ( See problems list.)   Pt will benefit from PT to maximize function and safety in order to get ready for next venue listed below.  Patient will benefit from continued inpatient follow up therapy, <3 hours/day, pt is in total agreement.          If plan is discharge home, recommend the following: A little help with walking and/or transfers;Two people to help with walking and/or transfers;Assistance with cooking/housework;Assist for transportation;Help with stairs or ramp for entrance   Can travel by private vehicle   Yes    Equipment Recommendations    Recommendations for Other Services       Functional Status Assessment Patient has had a recent decline in their functional status and demonstrates the ability to make significant improvements in function in a reasonable and predictable amount of time.     Precautions / Restrictions Precautions Precautions: Fall Recall of Precautions/Restrictions: Intact Restrictions Weight Bearing Restrictions Per Provider Order: No      Mobility  Bed Mobility Overal bed mobility: Modified Independent                  Transfers Overall transfer level: Needs assistance Equipment used: Straight cane Transfers: Sit to/from Stand Sit to Stand: Supervision           General transfer comment: S for  safety    Ambulation/Gait Ambulation/Gait assistance: Contact guard assist Gait Distance (Feet): 300 Feet (with propped standing recovery,  unable to get a reliable SpO2 reading on RA) Assistive device: Rolling walker (2 wheels) Gait Pattern/deviations: Step-through pattern Gait velocity: slow with no attempts with cuing to progress speed. Gait velocity interpretation: <1.31 ft/sec, indicative of household ambulator   General Gait Details: generally steady and slow gait speed.  Occasionally driffted L without LOB.  Stairs            Wheelchair Mobility     Tilt Bed    Modified Rankin (Stroke Patients Only)       Balance Overall balance assessment: Mild deficits observed, not formally tested, Needs assistance Sitting-balance support: No upper extremity supported, Feet supported Sitting balance-Leahy Scale: Fair       Standing balance-Leahy Scale: Fair Standing balance comment: static at EOB fair, dynamically poor                             Pertinent Vitals/Pain      Home Living Family/patient expects to be discharged to:: Private residence Living Arrangements: Alone Available Help at Discharge: Family;Available PRN/intermittently Type of Home: Apartment Home Access: Elevator;Stairs to enter Entrance Stairs-Rails: Can reach both Entrance Stairs-Number of Steps: 14/1 flight-only if elevator out of service   Home Layout: One level Home Equipment: Cane - single Librarian, academic (2 wheels) Additional Comments: Pt recently d/c from snf <1 week ago    Prior Function Prior Level of Function :  Independent/Modified Independent             Mobility Comments: Mod I, use of SPC ADLs Comments: mod I     Extremity/Trunk Assessment   Upper Extremity Assessment Upper Extremity Assessment: Overall WFL for tasks assessed;Generalized weakness    Lower Extremity Assessment Lower Extremity Assessment: Overall WFL for tasks assessed;Generalized  weakness    Cervical / Trunk Assessment Cervical / Trunk Assessment: Normal  Communication   Communication Communication: No apparent difficulties    Cognition Arousal: Alert Behavior During Therapy: WFL for tasks assessed/performed                             Following commands: Intact       Cueing Cueing Techniques: Verbal cues     General Comments General comments (skin integrity, edema, etc.): VSS, good response to activity based on visual observation,  pulse ox unable to read Sats today    Exercises     Assessment/Plan    PT Assessment Patient needs continued PT services  PT Problem List Decreased strength;Decreased activity tolerance;Decreased balance;Decreased mobility;Decreased knowledge of use of DME;Cardiopulmonary status limiting activity       PT Treatment Interventions DME instruction;Gait training;Stair training;Functional mobility training;Therapeutic activities;Balance training;Patient/family education    PT Goals (Current goals can be found in the Care Plan section)  Acute Rehab PT Goals Patient Stated Goal: Go to rehab to improve conditioning and attain Independence before I go back home. PT Goal Formulation: With patient Time For Goal Achievement: 04/02/24 Potential to Achieve Goals: Good    Frequency Min 2X/week     Co-evaluation               AM-PAC PT 6 Clicks Mobility  Outcome Measure Help needed turning from your back to your side while in a flat bed without using bedrails?: None Help needed moving from lying on your back to sitting on the side of a flat bed without using bedrails?: None Help needed moving to and from a bed to a chair (including a wheelchair)?: A Little Help needed standing up from a chair using your arms (e.g., wheelchair or bedside chair)?: A Little Help needed to walk in hospital room?: A Little Help needed climbing 3-5 steps with a railing? : A Little 6 Click Score: 20    End of Session    Activity Tolerance: Patient tolerated treatment well Patient left: in bed;with call bell/phone within reach Nurse Communication: Mobility status PT Visit Diagnosis: Other abnormalities of gait and mobility (R26.89);Muscle weakness (generalized) (M62.81);Difficulty in walking, not elsewhere classified (R26.2)    Time: 8890-8862 PT Time Calculation (min) (ACUTE ONLY): 28 min   Charges:   PT Evaluation $PT Eval Moderate Complexity: 1 Mod PT Treatments $Gait Training: 8-22 mins PT General Charges $$ ACUTE PT VISIT: 1 Visit         03/19/2024  India HERO., PT Acute Rehabilitation Services [phone]  (office)  Vinie GAILS Milessa Hogan 03/19/2024, 12:45 PM

## 2024-03-20 DIAGNOSIS — E7800 Pure hypercholesterolemia, unspecified: Secondary | ICD-10-CM | POA: Diagnosis not present

## 2024-03-20 DIAGNOSIS — I5023 Acute on chronic systolic (congestive) heart failure: Secondary | ICD-10-CM | POA: Diagnosis not present

## 2024-03-20 DIAGNOSIS — N3000 Acute cystitis without hematuria: Secondary | ICD-10-CM

## 2024-03-20 DIAGNOSIS — I2510 Atherosclerotic heart disease of native coronary artery without angina pectoris: Secondary | ICD-10-CM | POA: Diagnosis not present

## 2024-03-20 DIAGNOSIS — N184 Chronic kidney disease, stage 4 (severe): Secondary | ICD-10-CM | POA: Diagnosis not present

## 2024-03-20 LAB — BASIC METABOLIC PANEL WITH GFR
Anion gap: 7 (ref 5–15)
BUN: 39 mg/dL — ABNORMAL HIGH (ref 8–23)
CO2: 29 mmol/L (ref 22–32)
Calcium: 8.5 mg/dL — ABNORMAL LOW (ref 8.9–10.3)
Chloride: 102 mmol/L (ref 98–111)
Creatinine, Ser: 2.03 mg/dL — ABNORMAL HIGH (ref 0.61–1.24)
GFR, Estimated: 34 mL/min — ABNORMAL LOW (ref >60)
Glucose, Bld: 120 mg/dL — ABNORMAL HIGH (ref 70–99)
Potassium: 3.9 mmol/L (ref 3.5–5.1)
Sodium: 138 mmol/L (ref 135–145)

## 2024-03-20 LAB — URINE CULTURE: Culture: >=100000 — AB

## 2024-03-20 LAB — MAGNESIUM: Magnesium: 1.8 mg/dL (ref 1.7–2.4)

## 2024-03-20 MED ORDER — MAGNESIUM SULFATE 2 GM/50ML IV SOLN
2.0000 g | Freq: Once | INTRAVENOUS | Status: AC
  Administered 2024-03-20: 2 g via INTRAVENOUS
  Filled 2024-03-20: qty 50

## 2024-03-20 MED ORDER — AMOXICILLIN-POT CLAVULANATE 500-125 MG PO TABS
1.0000 | ORAL_TABLET | Freq: Two times a day (BID) | ORAL | Status: AC
  Administered 2024-03-20 – 2024-03-23 (×6): 1 via ORAL
  Filled 2024-03-20 (×6): qty 1

## 2024-03-20 MED ORDER — TORSEMIDE 20 MG PO TABS
80.0000 mg | ORAL_TABLET | Freq: Two times a day (BID) | ORAL | Status: DC
  Administered 2024-03-20 – 2024-03-22 (×4): 80 mg via ORAL
  Filled 2024-03-20 (×4): qty 4

## 2024-03-20 NOTE — Progress Notes (Signed)
 Progress Note   Patient: Joseph Tanner FMW:[id number] DOB: 1950-12-23 DOA: 03/17/2024     1 DOS: the patient was seen and examined on 03/20/2024   Brief hospital course: Mr. Pullin was admitted to the hospital with the working diagnosis of heart failure exacerbation.   73 yo male with the past medical history of hypertension, dyslipidemia, heart failure, coronary artery disease, COPD, CKD and cocaine use who presented with worsening lower extremity edema and diarrhea.  Recent hospitalization 07/15 to 02/20/24 for heart failure exacerbation. He was determined to have end stage heart failure, and transitioned to palliative care. He was discharged to SNF. From rehab he was discharged to home because not able to participate in physical therapy.  At home he was not taking his medications and over the last 4 to 5 days prior to admission, developed worsening edema, dyspnea and lightheadedness. He had intermittent bloody stools. EMS was called and he was transported to the ED.  On his initial physical examination his blood pressure was 129/90, HR 96, RR 20 and 02 saturation 100% Lungs with no wheezing or rhonchi, heart with S1 and S2 present and rhythmic, abdomen with no distention and positive lower extremity edema +++.   Na 134, K 34, Cl 104 bicarbonate 20 glucose 116 bun 38 cr 2,50  AST 28 ALT 13  Wbc 6,7 hgb 8,0 plt 114   Urine analysis SG 1.014, protein 30, negative nitrates, large leukocytes, negative hgb  > 50 wbc, 0-5 rbc  Urine culture positive for E coli   Chest radiograph with cardiomegaly, bilateral hilar vascular congestion with no infiltrates or effusions,   EKG 97 bpm, left axis, normal intervals, qtc 493, sinus rhythm with no significant ST segment changes, negative T wave V4 to V6, positive LVH.   08/23 responding well to diuresis, transitioned to po loop diuretic, plan to transfer to SNF   Assessment and Plan: * Acute on chronic systolic CHF (congestive heart failure)  (HCC) Echocardiogram with reduced LV systolic function with EF 20 to 25%, global hypokinesis, severe dilatation LV cavity, mild LVH, RV systolic function with severe reduction, moderate enlarged RV cavity, LA with moderate dilatation and RA with severe dilatation, RVSP 45.9 mmHg. No significant valvular disease.   Urine output 2,975 ml  Systolic blood pressure 100 mmHg range   Transition to torsemide  80 mg po bid  Continue with midodrine  for blood pressure support.   Coronary artery disease No chest pain, continue blood pressure support and statin.   CKD (chronic kidney disease) stage 4, GFR 15-29 ml/min (HCC) Hyponatremia, hypokalemia, hypomagnesemia.  (base serum cr 2,5)   Today renal function with serum cr at 2.0 with K at 3,9 and serum bicarbonate at 29  Na 138 and Mg 1.8   Add 40 meq Kcl and 2 g mag sulfate   Follow up renal function and electrolytes  Avoid hypotension and nephrotoxic medications   Pure hypercholesterolemia Continue statin  Pancytopenia (HCC) Continue DVT prophylaxis with heparin  sq  Follow up hgb is 8,0 with plt 114 and wbc 6.7   Protein-calorie malnutrition, severe Continue nutritional supplements   Cocaine abuse (HCC) Avoid cocaine, no acute intoxication   UTI (urinary tract infection) Positive urine culture and positive pyuria on urine analysis Urine culture positive for enterococcus fecalis.  Sensitive to ampicillin, will change antibiotic therapy to Augmentin       Subjective: Patient is feeling better dyspnea and lower extremity edema are improving, no chest pain, continue very weak and deconditioned   Physical  Exam: Vitals:   03/20/24 0451 03/20/24 0819 03/20/24 0826 03/20/24 1029  BP: 103/73  102/79 103/72  Pulse: 99  97 (!) 102  Resp: 20  (!) 22   Temp: 98.3 F (36.8 C)     TempSrc: Oral     SpO2: 100% 96% 100%   Weight: 55.8 kg     Height:       Neurology awake and alert ENT with mild pallor with no icterus Cardiovascular  with S1 and S2 present and regular with no gallops or rubs, positive systolic murmur at the apex No JVD Respiratory with no rales or wheezing, no rhonchi  Abdomen with no distention  Trace pitting lower extremity edema   Data Reviewed:    Family Communication: no family at the bedside   Disposition: Status is: Inpatient Remains inpatient appropriate because: recovering heart failure   Planned Discharge Destination: Skilled nursing facility     Author: Elidia Toribio Furnace, MD 03/20/2024 11:15 AM  For on call review [email or web address].

## 2024-03-20 NOTE — TOC Progression Note (Addendum)
 Transition of Care [name]) - Progression Note    Patient Details  Name: Joseph Tanner MRN: [id number] Date of Birth: 1951/06/20  Transition of Care [name]) CM/SW Contact  [name], LCSWA Phone Number: 03/20/2024, 9:36 AM  Clinical Narrative:     CSW spoke with patient and provided SNF bed offers. Patient request for CSW also to provide his spouse Joseph Tanner with SNF bed offers so that they can discuss and determine which SNF choice that he would like to go too. [name] request for CSW to follow back up with him around 3:00pm on SNF choice after able to discuss with his spouse Joseph Tanner and daughter Joseph Tanner. CSW spoke with Joseph Tanner and provided SNF bed offers. Joseph Tanner will discuss with patient and patients daughter Joseph Tanner.  Update- CSW spoke with patient, patient accepted SNF bed offer with [name]. Patient requested for CSW to reach out to Chaplain to discuss HCPOA. Patient also requested for CSW to reach out to financial counseling to screen him for medicaid. CSW spoke with [name] who will follow up with patient regarding HCPOA questions. CSW reached out to [name] with financial counseling and requested to screen patient for Medicaid. CSW spoke with [name] with [name] who confirmed SNF bed for patient. CSW requested for [name] to start insurance authorization for patient. CSW will continue to follow.  UpdateGLENWOOD [name] that patient is not managed by [employer]. Facility will need to start insurance authorization for patient. CSW spoke with [name] with [name] to start insurance authorization for patient.  Update- [name] confirmed facility will start insurance authorization for patient.  Expected Discharge Plan: Skilled Nursing Facility Barriers to Discharge: Continued Medical Work up               Expected Discharge Plan and Services In-house Referral: Clinical Social Work, Artist     Living arrangements for the past 2 months: Single Family  Home                                       Social Drivers of Health (SDOH) Interventions SDOH Screenings   Food Insecurity: No Food Insecurity (03/18/2024)  Housing: Unknown (03/18/2024)  Transportation Needs: No Transportation Needs (03/18/2024)  Utilities: Not At Risk (03/18/2024)  Alcohol Screen: Low Risk  (06/17/2023)  Financial Resource Strain: Low Risk  (06/17/2023)  Social Connections: Moderately Isolated (03/18/2024)  Tobacco Use: High Risk (02/13/2024)    Readmission Risk Interventions    02/11/2024   11:47 AM  Readmission Risk Prevention Plan  Transportation Screening Complete  PCP or Specialist Appt within 5-7 Days Complete  Home Care Screening Complete  Medication Review (RN CM) Complete

## 2024-03-20 NOTE — Progress Notes (Signed)
   03/20/24 0456  Provider Notification  Provider Name/Title Dr. Charlton  Date Provider Notified 03/20/24  Time Provider Notified [phone]  Method of Notification Page (secure chat)  Notification Reason Red med refusal (pt refused due dose of Lasix  80mg  IV at 0430H)  Provider response No new orders  Date of Provider Response 03/20/24  Time of Provider Response [phone]

## 2024-03-20 NOTE — Progress Notes (Signed)
   03/20/24 1138  Assess: MEWS Score  Temp 98.7 F (37.1 C)  BP 109/88  MAP (mmHg) 97  Pulse Rate 72  ECG Heart Rate 97  Resp (!) 21  SpO2 99 %  O2 Device Room Air  Assess: MEWS Score  MEWS Temp 0  MEWS Systolic 0  MEWS Pulse 0  MEWS RR 1  MEWS LOC 0  MEWS Score 1  MEWS Score Color Green  Assess: if the MEWS score is Yellow or Red  Were vital signs accurate and taken at a resting state? Yes  Does the patient meet 2 or more of the SIRS criteria? No  MEWS guidelines implemented   (pt's HR will increase to 100's at times since admission, MD is aware)  Assess: SIRS CRITERIA  SIRS Temperature  0  SIRS Respirations  1  SIRS Pulse 1  SIRS WBC 0  SIRS Score Sum  2

## 2024-03-20 NOTE — Plan of Care (Signed)
  Problem: Health Behavior/Discharge Planning: Goal: Ability to manage health-related needs will improve Outcome: Progressing   Problem: Clinical Measurements: Goal: Will remain free from infection Outcome: Progressing Goal: Respiratory complications will improve Outcome: Progressing Goal: Cardiovascular complication will be avoided Outcome: Progressing   Problem: Activity: Goal: Risk for activity intolerance will decrease Outcome: Progressing   Problem: Nutrition: Goal: Adequate nutrition will be maintained Outcome: Progressing   Problem: Elimination: Goal: Will not experience complications related to urinary retention Outcome: Progressing   Problem: Safety: Goal: Ability to remain free from injury will improve Outcome: Progressing

## 2024-03-20 NOTE — Progress Notes (Signed)
   03/20/24 1047  Spiritual Encounters  Type of Visit Initial  Care provided to: Patient  Conversation partners present during encounter Social worker/Care management/TOC  Referral source Social worker/Care management/TOC  OnCall Visit Yes   Responded to call from social worker to visit patient and discuss HCPOA as family will be visiting from out of town. Provided paperwork to patient whose daughter Harlene will complete and return. Advised that notary available on Mon-Fri, however paperwork can be placed in patient's chart by nurse and retrieved on Monday for signature and notary. Patient stated he didn't expect to be here on Monday as he is awaiting placement. Discussed with social worker who stated they are waiting for insurance approval and it is possible patient may still be in [facility]. Explained how to process AD if patient is discharged before Monday.

## 2024-03-21 DIAGNOSIS — I2510 Atherosclerotic heart disease of native coronary artery without angina pectoris: Secondary | ICD-10-CM | POA: Diagnosis not present

## 2024-03-21 DIAGNOSIS — E7800 Pure hypercholesterolemia, unspecified: Secondary | ICD-10-CM | POA: Diagnosis not present

## 2024-03-21 DIAGNOSIS — N184 Chronic kidney disease, stage 4 (severe): Secondary | ICD-10-CM | POA: Diagnosis not present

## 2024-03-21 DIAGNOSIS — I5023 Acute on chronic systolic (congestive) heart failure: Secondary | ICD-10-CM | POA: Diagnosis not present

## 2024-03-21 LAB — BASIC METABOLIC PANEL WITH GFR
Anion gap: 7 (ref 5–15)
BUN: 41 mg/dL — ABNORMAL HIGH (ref 8–23)
CO2: 27 mmol/L (ref 22–32)
Calcium: 8.5 mg/dL — ABNORMAL LOW (ref 8.9–10.3)
Chloride: 100 mmol/L (ref 98–111)
Creatinine, Ser: 2.04 mg/dL — ABNORMAL HIGH (ref 0.61–1.24)
GFR, Estimated: 34 mL/min — ABNORMAL LOW (ref >60)
Glucose, Bld: 105 mg/dL — ABNORMAL HIGH (ref 70–99)
Potassium: 4.2 mmol/L (ref 3.5–5.1)
Sodium: 134 mmol/L — ABNORMAL LOW (ref 135–145)

## 2024-03-21 LAB — MAGNESIUM: Magnesium: 1.9 mg/dL (ref 1.7–2.4)

## 2024-03-21 NOTE — Plan of Care (Signed)
  Problem: Clinical Measurements: Goal: Will remain free from infection Outcome: Progressing Goal: Respiratory complications will improve Outcome: Progressing Goal: Cardiovascular complication will be avoided Outcome: Progressing   Problem: Activity: Goal: Risk for activity intolerance will decrease Outcome: Progressing   Problem: Elimination: Goal: Will not experience complications related to urinary retention Outcome: Progressing   Problem: Skin Integrity: Goal: Risk for impaired skin integrity will decrease Outcome: Progressing

## 2024-03-21 NOTE — Progress Notes (Signed)
 Progress Note   Patient: Joseph Tanner FMW:[id number] DOB: Dec 28, 1950 DOA: 03/17/2024     2 DOS: the patient was seen and examined on 03/21/2024   Brief hospital course: Joseph Tanner was admitted to the hospital with the working diagnosis of heart failure exacerbation.   73 yo male with the past medical history of hypertension, dyslipidemia, heart failure, coronary artery disease, COPD, CKD and cocaine use who presented with worsening lower extremity edema and diarrhea.  Recent hospitalization 07/15 to 02/20/24 for heart failure exacerbation. He was determined to have end stage heart failure, and transitioned to palliative care. He was discharged to SNF. From rehab he was discharged to home because not able to participate in physical therapy.  At home he was not taking his medications and over the last 4 to 5 days prior to admission, developed worsening edema, dyspnea and lightheadedness. He had intermittent bloody stools. EMS was called and he was transported to the ED.  On his initial physical examination his blood pressure was 129/90, HR 96, RR 20 and 02 saturation 100% Lungs with no wheezing or rhonchi, heart with S1 and S2 present and rhythmic, abdomen with no distention and positive lower extremity edema +++.   Na 134, K 34, Cl 104 bicarbonate 20 glucose 116 bun 38 cr 2,50  AST 28 ALT 13  Wbc 6,7 hgb 8,0 plt 114   Urine analysis SG 1.014, protein 30, negative nitrates, large leukocytes, negative hgb  > 50 wbc, 0-5 rbc  Urine culture positive for E coli   Chest radiograph with cardiomegaly, bilateral hilar vascular congestion with no infiltrates or effusions,   EKG 97 bpm, left axis, normal intervals, qtc 493, sinus rhythm with no significant ST segment changes, negative T wave V4 to V6, positive LVH.   08/23 responding well to diuresis, transitioned to po loop diuretic, plan to transfer to SNF   Assessment and Plan: * Acute on chronic systolic CHF (congestive heart failure)  (HCC) Echocardiogram with reduced LV systolic function with EF 20 to 25%, global hypokinesis, severe dilatation LV cavity, mild LVH, RV systolic function with severe reduction, moderate enlarged RV cavity, LA with moderate dilatation and RA with severe dilatation, RVSP 45.9 mmHg. No significant valvular disease.   Urine output 3,350 ml  Systolic blood pressure 100 mmHg range   Continue with torsemide  80 mg po bid  Continue with midodrine  for blood pressure support.   Coronary artery disease No chest pain, continue blood pressure support and statin.   CKD (chronic kidney disease) stage 4, GFR 15-29 ml/min (HCC) Hyponatremia, hypokalemia, hypomagnesemia.  (base serum cr 2,5)   Stable serum cr at 2,0 with K at 4,2 and serum bicarbonate at 27 Na 134 and Mg 1,9   Continue diuresis with torsemide  80 mg po bid Follow up renal function and electrolytes  Avoid hypotension and nephrotoxic medications   Pure hypercholesterolemia Continue statin  Pancytopenia (HCC) Continue DVT prophylaxis with heparin  sq  Follow up hgb is 8,0 with plt 114 and wbc 6.7   Protein-calorie malnutrition, severe Continue nutritional supplements   Cocaine abuse (HCC) Avoid cocaine, no acute intoxication   UTI (urinary tract infection) Positive urine culture and positive pyuria on urine analysis Urine culture positive for enterococcus fecalis.  Sensitive to ampicillin, plan to antibiotic therapy to Augmentin  for 3 days         Subjective: Patient is feeling well, dyspnea and edema continue to improved, positive generalized weakness not yet back to baseline   Physical Exam: Vitals:  03/20/24 1955 03/21/24 0008 03/21/24 0315 03/21/24 0803  BP: 134/89 127/68 100/76 100/70  Pulse: 100 (!) 103 100 98  Resp: (!) 21 20 16 16   Temp: 98.4 F (36.9 C) 98 F (36.7 C) 98.3 F (36.8 C) (!) 97.4 F (36.3 C)  TempSrc: Oral Oral Oral Oral  SpO2: 100% 98% 100% 100%  Weight:      Height:       Neurology  awake and alert ENT with mild pallor Cardiovascular with S1 and S2 present and regular with no gallops rubs or murmurs Respiratory with mild rales at bases with no wheezing or rhonchi  Abdomen with no distention and non tender Trace  lower extremity edema   Data Reviewed:    Family Communication: no family at the bedside   Disposition: Status is: Inpatient Remains inpatient appropriate because: pending transfer to SNF   Planned Discharge Destination: Skilled nursing facility     Author: Elidia Toribio Furnace, MD 03/21/2024 8:25 AM  For on call review [email or web address].

## 2024-03-22 DIAGNOSIS — N184 Chronic kidney disease, stage 4 (severe): Secondary | ICD-10-CM | POA: Diagnosis not present

## 2024-03-22 DIAGNOSIS — E7800 Pure hypercholesterolemia, unspecified: Secondary | ICD-10-CM | POA: Diagnosis not present

## 2024-03-22 DIAGNOSIS — I2510 Atherosclerotic heart disease of native coronary artery without angina pectoris: Secondary | ICD-10-CM | POA: Diagnosis not present

## 2024-03-22 DIAGNOSIS — I5023 Acute on chronic systolic (congestive) heart failure: Secondary | ICD-10-CM | POA: Diagnosis not present

## 2024-03-22 LAB — RENAL FUNCTION PANEL
Albumin: 2.5 g/dL — ABNORMAL LOW (ref 3.5–5.0)
Anion gap: 8 (ref 5–15)
BUN: 49 mg/dL — ABNORMAL HIGH (ref 8–23)
CO2: 32 mmol/L (ref 22–32)
Calcium: 8.6 mg/dL — ABNORMAL LOW (ref 8.9–10.3)
Chloride: 93 mmol/L — ABNORMAL LOW (ref 98–111)
Creatinine, Ser: 2.22 mg/dL — ABNORMAL HIGH (ref 0.61–1.24)
GFR, Estimated: 31 mL/min — ABNORMAL LOW (ref >60)
Glucose, Bld: 123 mg/dL — ABNORMAL HIGH (ref 70–99)
Phosphorus: <1 mg/dL — CL (ref 2.5–4.6)
Potassium: 4.4 mmol/L (ref 3.5–5.1)
Sodium: 133 mmol/L — ABNORMAL LOW (ref 135–145)

## 2024-03-22 MED ORDER — LORAZEPAM 1 MG PO TABS
1.0000 mg | ORAL_TABLET | Freq: Every day | ORAL | Status: DC
  Administered 2024-03-22 – 2024-03-31 (×10): 1 mg via ORAL
  Filled 2024-03-22 (×10): qty 1

## 2024-03-22 MED ORDER — TORSEMIDE 20 MG PO TABS
80.0000 mg | ORAL_TABLET | Freq: Every day | ORAL | Status: DC
  Administered 2024-03-23 – 2024-03-25 (×3): 80 mg via ORAL
  Filled 2024-03-22 (×4): qty 4

## 2024-03-22 MED ORDER — SODIUM PHOSPHATES 45 MMOLE/15ML IV SOLN
45.0000 mmol | Freq: Once | INTRAVENOUS | Status: AC
  Administered 2024-03-22: 45 mmol via INTRAVENOUS
  Filled 2024-03-22: qty 15

## 2024-03-22 MED ORDER — DIPHENHYDRAMINE HCL 25 MG PO CAPS: 25.0000 mg | ORAL_CAPSULE | Freq: Every evening | ORAL | Status: DC | PRN

## 2024-03-22 NOTE — Progress Notes (Signed)
 Progress Note   Patient: Joseph Tanner FMW:[id number] DOB: 03-15-1951 DOA: 03/17/2024     3 DOS: the patient was seen and examined on 03/22/2024   Brief hospital course: Mr. Summons was admitted to the hospital with the working diagnosis of heart failure exacerbation.   73 yo male with the past medical history of hypertension, dyslipidemia, heart failure, coronary artery disease, COPD, CKD and cocaine use who presented with worsening lower extremity edema and diarrhea.  Recent hospitalization 07/15 to 02/20/24 for heart failure exacerbation. He was determined to have end stage heart failure, and transitioned to palliative care. He was discharged to SNF. From rehab he was discharged to home because not able to participate in physical therapy.  At home he was not taking his medications and over the last 4 to 5 days prior to admission, developed worsening edema, dyspnea and lightheadedness. He had intermittent bloody stools. EMS was called and he was transported to the ED.  On his initial physical examination his blood pressure was 129/90, HR 96, RR 20 and 02 saturation 100% Lungs with no wheezing or rhonchi, heart with S1 and S2 present and rhythmic, abdomen with no distention and positive lower extremity edema +++.   Na 134, K 34, Cl 104 bicarbonate 20 glucose 116 bun 38 cr 2,50  AST 28 ALT 13  Wbc 6,7 hgb 8,0 plt 114   Urine analysis SG 1.014, protein 30, negative nitrates, large leukocytes, negative hgb  > 50 wbc, 0-5 rbc  Urine culture positive for E coli   Chest radiograph with cardiomegaly, bilateral hilar vascular congestion with no infiltrates or effusions,   EKG 97 bpm, left axis, normal intervals, qtc 493, sinus rhythm with no significant ST segment changes, negative T wave V4 to V6, positive LVH.   08/23 responding well to diuresis, transitioned to po loop diuretic, plan to transfer to SNF  08/25 medically  stable for SNF   Assessment and Plan: * Acute on chronic  systolic CHF (congestive heart failure) (HCC) Echocardiogram with reduced LV systolic function with EF 20 to 25%, global hypokinesis, severe dilatation LV cavity, mild LVH, RV systolic function with severe reduction, moderate enlarged RV cavity, LA with moderate dilatation and RA with severe dilatation, RVSP 45.9 mmHg. No significant valvular disease.   Urine output 1,500 ml  Systolic blood pressure 100 mmHg range   Change torsemide  80 mg po to daily.  Continue with midodrine  for blood pressure support.   Coronary artery disease No chest pain, continue blood pressure support and statin.   CKD (chronic kidney disease) stage 4, GFR 15-29 ml/min (HCC) Hyponatremia, hypokalemia, hypomagnesemia. hypoP  (base serum cr 2,5)   Renal function today with serum cr at 2,2 with K at 4,4 and serum bicarbonate at 32 Na 133 and P < 1.0   Continue diuresis with torsemide  80 mg po daily IV NaP 45 mmol today x1  Follow up renal function and electrolytes  Avoid hypotension and nephrotoxic medications   Pure hypercholesterolemia Continue statin  Pancytopenia (HCC) Continue DVT prophylaxis with heparin  sq  Follow up hgb is 8,0 with plt 114 and wbc 6.7   Protein-calorie malnutrition, severe Continue nutritional supplements   Cocaine abuse (HCC) Avoid cocaine, no acute intoxication   UTI (urinary tract infection) Positive urine culture and positive pyuria on urine analysis Urine culture positive for enterococcus fecalis.  Sensitive to ampicillin, plan to antibiotic therapy to Augmentin  for 3 days         Subjective: patient today is feeling  fatigue and tired, mild dyspnea, no PND or orthopnea, edema continue to improve   Physical Exam: Vitals:   03/22/24 0515 03/22/24 0755 03/22/24 0837 03/22/24 1114  BP: 92/68  (!) 113/4 122/82  Pulse: 94  100   Resp: 15     Temp: 97.8 F (36.6 C)   97.6 F (36.4 C)  TempSrc: Oral   Oral  SpO2: 97% 97% 100%   Weight: 57.2 kg     Height:        Neurology awake and alert, deconditioned ENT with no pallor Cardiovascular with S1 and S2 present and regular with no gallops or rubs, positive systolic murmur at the apex No JVD  Respiratory with no rales or wheezing, no rhonchi  Abdomen with no distention, soft and non tender No lower extremity edema   Data Reviewed:    Family Communication: no family at the bedside   Disposition: Status is: Inpatient Remains inpatient appropriate because: pending transfer to SNF   Planned Discharge Destination: Skilled nursing facility     Author: Elidia Toribio Furnace, MD 03/22/2024 2:09 PM  For on call review [email or web address].

## 2024-03-22 NOTE — TOC Progression Note (Signed)
 Transition of Care [facility]) - Progression Note    Patient Details  Name: Joseph Tanner MRN: [id number] Date of Birth: 04-02-1951  Transition of Care [facility]) CM/SW Contact  [name], [place] Phone Number: 03/22/2024, 8:50AM  Clinical Narrative:   CSW reached out to [name] with [employer] about patients insurance authorization. Per [name], facility will start auth today.   CSW will continue to follow.    Expected Discharge Plan: Skilled Nursing Facility Barriers to Discharge: Continued Medical Work up               Expected Discharge Plan and Services In-house Referral: Clinical Social Work, Artist     Living arrangements for the past 2 months: Single Family Home                                       Social Drivers of Health (SDOH) Interventions SDOH Screenings   Food Insecurity: No Food Insecurity (03/18/2024)  Housing: Unknown (03/18/2024)  Transportation Needs: No Transportation Needs (03/18/2024)  Utilities: Not At Risk (03/18/2024)  Alcohol Screen: Low Risk  (06/17/2023)  Financial Resource Strain: Low Risk  (06/17/2023)  Social Connections: Moderately Isolated (03/18/2024)  Tobacco Use: High Risk (02/13/2024)    Readmission Risk Interventions    02/11/2024   11:47 AM  Readmission Risk Prevention Plan  Transportation Screening Complete  PCP or Specialist Appt within 5-7 Days Complete  Home Care Screening Complete  Medication Review (RN CM) Complete

## 2024-03-22 NOTE — Progress Notes (Addendum)
   03/22/24 0310  Assess: MEWS Score  Temp 97.8 F (36.6 C)  BP 97/66  MAP (mmHg) 76  Pulse Rate (!) 102  ECG Heart Rate (!) 102  Resp 17  Level of Consciousness Alert  SpO2 98 %  O2 Device Room Air  Patient Activity (if Appropriate) In bed  Assess: MEWS Score  MEWS Temp 0  MEWS Systolic 1  MEWS Pulse 1  MEWS RR 0  MEWS LOC 0  MEWS Score 2  MEWS Score Color Yellow  Assess: if the MEWS score is Yellow or Red  Were vital signs accurate and taken at a resting state? Yes  Does the patient meet 2 or more of the SIRS criteria? No  MEWS guidelines implemented  Yes, yellow  Treat  MEWS Interventions Considered administering scheduled or prn medications/treatments as ordered  Take Vital Signs  Increase Vital Sign Frequency  Yellow: Q2hr x1, continue Q4hrs until patient remains green for 12hrs  Escalate  MEWS: Escalate Yellow: Discuss with charge nurse and consider notifying provider and/or RRT  Notify: Charge Nurse/RN  Name of Charge Nurse/RN Notified Fred, RN  Provider Notification  Provider Name/Title Dr. Patsy  Date Provider Notified 03/22/24  Time Provider Notified 0330  Method of Notification Page (secure chat)  Notification Reason New onset of dysrhythmia (5 runs NSVT)  Provider response No new orders  Date of Provider Response 03/22/24  Time of Provider Response 0331  Assess: SIRS CRITERIA  SIRS Temperature  0  SIRS Respirations  0  SIRS Pulse 1  SIRS WBC 0  SIRS Score Sum  1   Seen patient laying on bed, denies chest pain, shortness of breath and palpitations. Rechecked vital signs.Oncall MD updated. Safety precautions in place. Continue to provide care per plan.

## 2024-03-22 NOTE — Progress Notes (Deleted)
   03/22/24 0405  Assess: MEWS Score  ECG Heart Rate 99  Resp 20  Assess: MEWS Score  MEWS Temp 0  MEWS Systolic 1  MEWS Pulse 0  MEWS RR 0  MEWS LOC 0  MEWS Score 1  MEWS Score Color Green  Provider Notification  Provider Name/Title Dr. Patsy  Date Provider Notified 03/22/24  Time Provider Notified 0405  Method of Notification Page (secure [facility])  Notification Reason Critical Result (Phosphorus <1.0 mg/dl)  Provider response No new orders  Date of Provider Response 03/22/24  Assess: SIRS CRITERIA  SIRS Temperature  0  SIRS Respirations  0  SIRS Pulse 1  SIRS WBC 0  SIRS Score Sum  1

## 2024-03-22 NOTE — Progress Notes (Signed)
 OT Cancellation Note  Patient Details Name: Joseph Tanner MRN: [id number] DOB: 1951-07-14   Cancelled Treatment:    Reason Eval/Treat Not Completed: Patient declined, no reason specified Pt declined, citing preference to take a nap for now. Will follow up for OT session as schedule permits  Mliss Fish 03/22/2024, 1:04 PM

## 2024-03-22 NOTE — Progress Notes (Signed)
   03/22/24 0405  Assess: MEWS Score  ECG Heart Rate 99  Resp 20  Assess: MEWS Score  MEWS Temp 0  MEWS Systolic 1  MEWS Pulse 0  MEWS RR 0  MEWS LOC 0  MEWS Score 1  MEWS Score Color Green  Provider Notification  Provider Name/Title Dr. Patsy  Date Provider Notified 03/22/24  Time Provider Notified 0405  Method of Notification Page (secure [facility])  Notification Reason Critical Result (Phosphorus <1.0 mg/dl)  Provider response See new orders  Date of Provider Response 03/22/24  Time of Provider Response 0420  Assess: SIRS CRITERIA  SIRS Temperature  0  SIRS Respirations  0  SIRS Pulse 1  SIRS WBC 0  SIRS Score Sum  1

## 2024-03-22 NOTE — Plan of Care (Signed)
   Problem: Education: Goal: Knowledge of General Education information will improve Description Including pain rating scale, medication(s)/side effects and non-pharmacologic comfort measures Outcome: Progressing   Problem: Health Behavior/Discharge Planning: Goal: Ability to manage health-related needs will improve Outcome: Progressing

## 2024-03-22 NOTE — Progress Notes (Signed)
 Physical Therapy Treatment Patient Details Name: Joseph Tanner MRN: [id number] DOB: 1951-07-03 Today's Date: 03/22/2024   History of Present Illness 73 y.o. male presents [facility]/10/24 with SOB. Chest x-ray concerning for aspiration or PNA, +UTI, + cocaine. PMHx: hypertension, hyperlipidemia, combined systolic and diastolic CHF, NSTEMI, CAD s/p PCI, COPD, CKD stage IV, and cocaine abuse. PT asked re-evalute if patient needs SNF on 7/22.    PT Comments  Pt making steady progress. Pt without support at home and will benefit from continued inpatient follow up therapy, <3 hours/day.     If plan is discharge home, recommend the following: A little help with walking and/or transfers;Two people to help with walking and/or transfers;Assistance with cooking/housework;Assist for transportation;Help with stairs or ramp for entrance   Can travel by private vehicle     Yes  Equipment Recommendations       Recommendations for Other Services       Precautions / Restrictions Precautions Precautions: Fall Recall of Precautions/Restrictions: Intact Restrictions Weight Bearing Restrictions Per Provider Order: No     Mobility  Bed Mobility Overal bed mobility: Modified Independent                  Transfers Overall transfer level: Needs assistance Equipment used: Rollator (4 wheels) Transfers: Sit to/from Stand Sit to Stand: Supervision           General transfer comment: Supervision for safety    Ambulation/Gait Ambulation/Gait assistance: Contact guard assist Gait Distance (Feet): 100 Feet Assistive device: Rollator (4 wheels) Gait Pattern/deviations: Step-through pattern, Decreased stride length Gait velocity: decr Gait velocity interpretation: 1.31 - 2.62 ft/sec, indicative of limited community ambulator   General Gait Details: Assist for safety   Stairs             Wheelchair Mobility     Tilt Bed    Modified Rankin (Stroke Patients Only)        Balance Overall balance assessment: Needs assistance Sitting-balance support: No upper extremity supported, Feet supported Sitting balance-Leahy Scale: Good     Standing balance support: No upper extremity supported Standing balance-Leahy Scale: Fair                              Hotel manager: No apparent difficulties  Cognition Arousal: Alert Behavior During Therapy: WFL for tasks assessed/performed   PT - Cognitive impairments: No apparent impairments                         Following commands: Intact      Cueing Cueing Techniques: Verbal cues  Exercises      General Comments General comments (skin integrity, edema, etc.): VSS      Pertinent Vitals/Pain Pain Assessment Pain Assessment: No/denies pain    Home Living                          Prior Function            PT Goals (current goals can now be found in the care plan section) Acute Rehab PT Goals Patient Stated Goal: Go to rehab to improve conditioning and attain Independence before I go back home. Progress towards PT goals: Progressing toward goals    Frequency    Min 2X/week      PT Plan      Co-evaluation  AM-PAC PT 6 Clicks Mobility   Outcome Measure  Help needed turning from your back to your side while in a flat bed without using bedrails?: None Help needed moving from lying on your back to sitting on the side of a flat bed without using bedrails?: None Help needed moving to and from a bed to a chair (including a wheelchair)?: A Little Help needed standing up from a chair using your arms (e.g., wheelchair or bedside chair)?: A Little Help needed to walk in hospital room?: A Little Help needed climbing 3-5 steps with a railing? : A Little 6 Click Score: 20    End of Session Equipment Utilized During Treatment: Gait belt Activity Tolerance: Patient tolerated treatment well Patient left: in bed;with  call bell/phone within reach   PT Visit Diagnosis: Other abnormalities of gait and mobility (R26.89);Muscle weakness (generalized) (M62.81);Difficulty in walking, not elsewhere classified (R26.2)     Time: 8842-8787 PT Time Calculation (min) (ACUTE ONLY): 15 min  Charges:    $Gait Training: 8-22 mins PT General Charges $$ ACUTE PT VISIT: 1 Visit                     [facility] PT Acute Rehabilitation Services Office [phone]    [place] ORN [facility] 03/22/2024, 2:58 PM

## 2024-03-22 NOTE — Progress Notes (Signed)
   03/22/24 1618  Spiritual Encounters  Type of Visit Initial  Care provided to: Pt and family  Referral source Patient request  Reason for visit Advance directives  OnCall Visit No  Spiritual Framework  Presenting Themes Impactful experiences and emotions  Community/Connection Family  Patient Stress Factors Health changes  Family Stress Factors Health changes  Interventions  Spiritual Care Interventions Made Compassionate presence;Established relationship of care and support  Intervention Outcomes  Outcomes Awareness of health;Awareness of support   Family member (Daughter) inquired about Advance Directive (AD) as the Pt is being discharged today. Chaplain provided the daughter with the AD form to complete. Daughter stated she would fill it out with Pt and return it to the hospital.  Chaplain informed family that once completed, the document could be notarized and upload into the Pt's chart.

## 2024-03-22 NOTE — Plan of Care (Signed)
  Problem: Clinical Measurements: Goal: Will remain free from infection Outcome: Progressing   Problem: Elimination: Goal: Will not experience complications related to bowel motility Outcome: Progressing Goal: Will not experience complications related to urinary retention Outcome: Progressing   Problem: Safety: Goal: Ability to remain free from injury will improve Outcome: Progressing   Problem: Cardiac: Goal: Ability to achieve and maintain adequate cardiopulmonary perfusion will improve Outcome: Progressing

## 2024-03-23 ENCOUNTER — Ambulatory Visit: Admitting: Family Medicine

## 2024-03-23 DIAGNOSIS — I5023 Acute on chronic systolic (congestive) heart failure: Secondary | ICD-10-CM | POA: Diagnosis not present

## 2024-03-23 DIAGNOSIS — E7800 Pure hypercholesterolemia, unspecified: Secondary | ICD-10-CM | POA: Diagnosis not present

## 2024-03-23 DIAGNOSIS — N184 Chronic kidney disease, stage 4 (severe): Secondary | ICD-10-CM | POA: Diagnosis not present

## 2024-03-23 DIAGNOSIS — I2510 Atherosclerotic heart disease of native coronary artery without angina pectoris: Secondary | ICD-10-CM | POA: Diagnosis not present

## 2024-03-23 LAB — RENAL FUNCTION PANEL
Albumin: 2.4 g/dL — ABNORMAL LOW (ref 3.5–5.0)
Anion gap: 13 (ref 5–15)
BUN: 60 mg/dL — ABNORMAL HIGH (ref 8–23)
CO2: 30 mmol/L (ref 22–32)
Calcium: 8.4 mg/dL — ABNORMAL LOW (ref 8.9–10.3)
Chloride: 89 mmol/L — ABNORMAL LOW (ref 98–111)
Creatinine, Ser: 2.15 mg/dL — ABNORMAL HIGH (ref 0.61–1.24)
GFR, Estimated: 32 mL/min — ABNORMAL LOW (ref >60)
Glucose, Bld: 116 mg/dL — ABNORMAL HIGH (ref 70–99)
Phosphorus: 2.7 mg/dL (ref 2.5–4.6)
Potassium: 3.7 mmol/L (ref 3.5–5.1)
Sodium: 132 mmol/L — ABNORMAL LOW (ref 135–145)

## 2024-03-23 LAB — MAGNESIUM: Magnesium: 1.6 mg/dL — ABNORMAL LOW (ref 1.7–2.4)

## 2024-03-23 LAB — GLUCOSE, CAPILLARY: Glucose-Capillary: 149 mg/dL — ABNORMAL HIGH (ref 70–99)

## 2024-03-23 MED ORDER — MAGNESIUM SULFATE 2 GM/50ML IV SOLN
2.0000 g | Freq: Once | INTRAVENOUS | Status: AC
  Administered 2024-03-23: 2 g via INTRAVENOUS
  Filled 2024-03-23: qty 50

## 2024-03-23 MED ORDER — POTASSIUM CHLORIDE CRYS ER 20 MEQ PO TBCR
40.0000 meq | EXTENDED_RELEASE_TABLET | Freq: Once | ORAL | Status: AC
  Administered 2024-03-23: 40 meq via ORAL
  Filled 2024-03-23: qty 2

## 2024-03-23 MED ORDER — TORSEMIDE 40 MG PO TABS: 80.0000 mg | ORAL_TABLET | Freq: Every day | ORAL | 0 refills | Status: DC

## 2024-03-23 MED ORDER — MIDODRINE HCL 5 MG PO TABS: 5.0000 mg | ORAL_TABLET | Freq: Three times a day (TID) | ORAL | 0 refills | Status: DC

## 2024-03-23 NOTE — Plan of Care (Signed)
   Problem: Education: Goal: Knowledge of General Education information will improve Description: Including pain rating scale, medication(s)/side effects and non-pharmacologic comfort measures Outcome: Progressing   Problem: Nutrition: Goal: Adequate nutrition will be maintained Outcome: Progressing   Problem: Safety: Goal: Ability to remain free from injury will improve Outcome: Progressing

## 2024-03-23 NOTE — Progress Notes (Signed)
 This chaplain returned phone call to the Pt. Daughter-Jessica Blyden [phone]. Harlene is following up on AD visit on Monday.  Harlene is requesting F/U phone call from Spiritual Care on Wednesday morning. Harlene is unsure about Pt. d/c plans and prefers to complete AD in the hospital.  This chaplain will make the referral to spiritual care.  Chaplain Leeroy Hummer [phone]

## 2024-03-23 NOTE — TOC Progression Note (Signed)
 Transition of Care [facility]) - Progression Note    Patient Details  Name: Joseph Tanner MRN: [id number] Date of Birth: Nov 30, 1950  [facility]) CM/SW Contact  [name], [place] Phone Number: 03/23/2024, 10:41 AM  Clinical Narrative:  Per [facility], insurance authorization is pending as of this morning.   CSW will continue to follow.    Expected Discharge Plan: Skilled Nursing Facility Barriers to Discharge: Insurance Authorization               Expected Discharge Plan and Services In-house Referral: Clinical Social Work, Artist     Living arrangements for the past 2 months: Single Family Home                                       Social Drivers of Health (SDOH) Interventions SDOH Screenings   Food Insecurity: No Food Insecurity (03/18/2024)  Housing: Unknown (03/18/2024)  Transportation Needs: No Transportation Needs (03/18/2024)  Utilities: Not At Risk (03/18/2024)  Alcohol Screen: Low Risk  (06/17/2023)  Financial Resource Strain: Low Risk  (06/17/2023)  Social Connections: Moderately Isolated (03/18/2024)  Tobacco Use: High Risk (02/13/2024)    Readmission Risk Interventions    02/11/2024   11:47 AM  Readmission Risk Prevention Plan  Transportation Screening Complete  PCP or Specialist Appt within 5-7 Days Complete  Home Care Screening Complete  Medication Review (RN CM) Complete

## 2024-03-23 NOTE — Progress Notes (Signed)
 Occupational Therapy Treatment Patient Details Name: Joseph Tanner MRN: [id number] DOB: [birth year] Today's Date: 03/23/2024   History of present illness 73 y.o. male presents to [facility] 02/10/24 with SOB. Chest x-ray concerning for aspiration or PNA, +UTI, + cocaine. PMHx: hypertension, hyperlipidemia, combined systolic and diastolic CHF, NSTEMI, CAD s/p PCI, COPD, CKD stage IV, and cocaine abuse. PT asked re-evalute if patient needs SNF on 7/22.   OT comments  This 73 yo male seen today and is doing well from a mobility standpoint with RW (needs to progress to [facility]) and close to baseline for ADLs--but needs to build up endurance, independence, and safety with IADLs since he lives alone. He will continue to benefit from acute OT with follow up OT  from continued inpatient follow up therapy, <3 hours/day.       If plan is discharge home, recommend the following:  A little help with walking and/or transfers;A little help with bathing/dressing/bathroom;Assistance with cooking/housework;Assist for transportation;Help with stairs or ramp for entrance   Equipment Recommendations  BSC/3in1       Precautions / Restrictions Precautions Precautions: Fall Recall of Precautions/Restrictions: Intact Restrictions Weight Bearing Restrictions Per Provider Order: No       Mobility Bed Mobility Overal bed mobility: Modified Independent                  Transfers Overall transfer level: Needs assistance Equipment used: Rolling walker (2 wheels) Transfers: Sit to/from Stand Sit to Stand: Supervision           General transfer comment: Supervision for safety     Balance Overall balance assessment: Needs assistance Sitting-balance support: No upper extremity supported, Feet supported Sitting balance-Leahy Scale: Good     Standing balance support: No upper extremity supported Standing balance-Leahy Scale: Fair Standing balance comment: standing at sink to wash hands                            ADL either performed or assessed with clinical judgement   ADL Overall ADL's : Needs assistance/impaired     Grooming: Wash/dry hands;Supervision/safety;Standing                   Toilet Transfer: Supervision/safety;Ambulation;Regular Teacher, adult education Details (indicate cue type and reason): standing, used RW to get there but is close to being able to use a SPC (that he was using pta and is in his room) Toileting- Architect and Hygiene: Supervision/safety Toileting - Clothing Manipulation Details (indicate cue type and reason): standing            Extremity/Trunk Assessment Upper Extremity Assessment Upper Extremity Assessment: Overall WFL for tasks assessed            Vision Baseline Vision/History: 0 No visual deficits Patient Visual Report: No change from baseline           Communication Communication Communication: No apparent difficulties   Cognition Arousal: Alert Behavior During Therapy: WFL for tasks assessed/performed Cognition: No apparent impairments                               Following commands: Intact                      Pertinent Vitals/ Pain       Pain Assessment Pain Assessment: No/denies pain         Frequency  Min 1X/week  Progress Toward Goals  OT Goals(current goals can now be found in the care plan section)  Progress towards OT goals: Progressing toward goals  Acute Rehab OT Goals Patient Stated Goal: to go to rehab and get to where I can totally take care of myself again OT Goal Formulation: With patient Time For Goal Achievement: 04/01/24 Potential to Achieve Goals: Good         AM-PAC OT 6 Clicks Daily Activity     Outcome Measure   Help from another person eating meals?: None Help from another person taking care of personal grooming?: A Little Help from another person toileting, which includes using toliet, bedpan, or urinal?: A  Little Help from another person bathing (including washing, rinsing, drying)?: A Little Help from another person to put on and taking off regular upper body clothing?: A Little Help from another person to put on and taking off regular lower body clothing?: A Little 6 Click Score: 19    End of Session Equipment Utilized During Treatment: Rolling walker (2 wheels)  OT Visit Diagnosis: Unsteadiness on feet (R26.81);Other abnormalities of gait and mobility (R26.89);Muscle weakness (generalized) (M62.81)   Activity Tolerance Patient tolerated treatment well   Patient Left in bed;with call bell/phone within reach;with bed alarm set   Nurse Communication  (NT via secure chat: I emptied 300 ccs of urine)        Time: 8691-8679 OT Time Calculation (min): 12 min  Charges: OT General Charges $OT Visit: 1 Visit OT Treatments $Self Care/Home Management : 8-22 mins  Joseph Tanner OT Acute Rehabilitation Services Office [phone]    Joseph Tanner 03/23/2024, 1:37 PM

## 2024-03-23 NOTE — Plan of Care (Signed)
 Patient had multiple recurrence of NSVT during the night, patient is asymptomatic. Oncall MD notified about the events and  placed new orders.   EKG completed. Magnesium  correction started. Continue to provide care per plan. Safety precautions keep in place. AM nurse made aware with the events.   Problem: Elimination: Goal: Will not experience complications related to bowel motility Outcome: Progressing Goal: Will not experience complications related to urinary retention Outcome: Progressing   Problem: Safety: Goal: Ability to remain free from injury will improve Outcome: Progressing

## 2024-03-23 NOTE — Discharge Summary (Signed)
 Physician Discharge Summary   Patient: Joseph Tanner MRN: [id number] DOB: 1950/09/18  Admit date:     03/17/2024  Discharge date: 03/23/24  Discharge Physician: Joseph Tanner   PCP: Pcp, No   Recommendations at discharge:    Patient was placed on torsemide  80 mg daily for diuresis Added midodrine  for blood pressure support Limited medical therapy for heart failure due to end stage disease and risk of hypotension, continue goals of care discussion as outpatient.  Follow up renal function and electrolytes in 7 days as outpatient Follow up with primary care in 7 to 10 days.   Discharge Diagnoses: Principal Problem:   Acute on chronic systolic CHF (congestive heart failure) (HCC) Active Problems:   Coronary artery disease   CKD (chronic kidney disease) stage 4, GFR 15-29 ml/min (HCC)   Pure hypercholesterolemia   Pancytopenia (HCC)   Protein-calorie malnutrition, severe   Cocaine abuse (HCC)   UTI (urinary tract infection)  Resolved Problems:   * No resolved hospital problems. [facility] Course: Joseph Tanner was admitted to the hospital with the working diagnosis of heart failure exacerbation.   73 yo male with the past medical history of hypertension, dyslipidemia, heart failure, coronary artery disease, COPD, CKD and cocaine use who presented with worsening lower extremity edema and diarrhea.  Recent hospitalization 07/15 to 02/20/24 for heart failure exacerbation. He was determined to have end stage heart failure, and transitioned to palliative care. He was discharged to SNF. From rehab he was discharged to home because not able to participate in physical therapy.  At home he was not taking his medications and over the last 4 to 5 days prior to admission, developed worsening edema, dyspnea and lightheadedness. He had intermittent bloody stools. EMS was called and he was transported to the ED.  On his initial physical examination his blood pressure was 129/90, HR  96, RR 20 and 02 saturation 100% Lungs with no wheezing or rhonchi, heart with S1 and S2 present and rhythmic, abdomen with no distention and positive lower extremity edema +++.   Na 134, K 34, Cl 104 bicarbonate 20 glucose 116 bun 38 cr 2,50  AST 28 ALT 13  Wbc 6,7 hgb 8,0 plt 114   Urine analysis SG 1.014, protein 30, negative nitrates, large leukocytes, negative hgb  > 50 wbc, 0-5 rbc  Urine culture positive for E coli   Chest radiograph with cardiomegaly, bilateral hilar vascular congestion with no infiltrates or effusions,   EKG 97 bpm, left axis, normal intervals, qtc 493, sinus rhythm with no significant ST segment changes, negative T wave V4 to V6, positive LVH.   08/23 responding well to diuresis, transitioned to po loop diuretic, plan to transfer to SNF  08/25 medically  stable for SNF  08/26 pending insurance authorization for SNF.   Assessment and Plan: * Acute on chronic systolic CHF (congestive heart failure) (HCC) Echocardiogram with reduced LV systolic function with EF 20 to 25%, global hypokinesis, severe dilatation LV cavity, mild LVH, RV systolic function with severe reduction, moderate enlarged RV cavity, LA with moderate dilatation and RA with severe dilatation, RVSP 45.9 mmHg. No significant valvular disease.   Patient was placed on IV furosemide , negative fluid balance was achieved, - 6,717 ml, with significant improvement in his symptoms.   Continue diuresis with oral torsemide  80 mg po to daily.  Continue with midodrine  for blood pressure support.  Limited medical therapy due to advanced heart failure and risk of hypotension.  End stage  heart failure, cardiac cachexia.   Coronary artery disease No chest pain, continue blood pressure support and statin.   CKD (chronic kidney disease) stage 4, GFR 15-29 ml/min (HCC) Hyponatremia, hypokalemia, hypomagnesemia. hypoP  (base serum cr 2,5)   Renal function with serum cr at 2,15 with K at 3,7 and serum  bicarbonate at 30  Na 132 and Mg 1.6   Patient had Kcl, Mg and Nphos supplementation with good toleration.  Continue diuresis with torsemide  80 mg po daily  Follow up renal function and electrolytes regularly  Avoid hypotension and nephrotoxic medications   Pure hypercholesterolemia Continue statin  Pancytopenia (HCC) Continue DVT prophylaxis with heparin  sq  Follow up hgb is 8,0 with plt 114 and wbc 6.7   Protein-calorie malnutrition, severe Continue nutritional supplements   Cocaine abuse (HCC) Avoid cocaine, no acute intoxication   UTI (urinary tract infection) Positive urine culture and positive pyuria on urine analysis Urine culture positive for enterococcus fecalis.  Sensitive to ampicillin, treated with antibiotic therapy with Augmentin  for 3 days       Consultants: none  Procedures performed: none   Disposition: Skilled nursing facility Diet recommendation:  Cardiac diet fluids restriction 1200 ml per day  DISCHARGE MEDICATION: Allergies as of 03/23/2024   No Known Allergies      Medication List     TAKE these medications    acetaminophen  325 MG tablet Commonly known as: TYLENOL  Take 2 tablets (650 mg total) by mouth every 6 (six) hours as needed for mild pain (pain score 1-3) or fever (or Fever >/= 101).   albuterol  108 (90 Base) MCG/ACT inhaler Commonly known as: VENTOLIN  HFA Inhale 2 puffs into the lungs every 6 (six) hours as needed for wheezing or shortness of breath.   atorvastatin  40 MG tablet Commonly known as: LIPITOR Take 1 tablet (40 mg total) by mouth every evening.   feeding supplement Liqd Take 237 mLs by mouth 3 (three) times daily between meals.   folic acid  1 MG tablet Commonly known as: FOLVITE  Take 1 tablet (1 mg total) by mouth daily.   melatonin 5 MG Tabs Take 1 tablet (5 mg total) by mouth at bedtime as needed (insomnia).   midodrine  5 MG tablet Commonly known as: PROAMATINE  Take 1 tablet (5 mg total) by mouth 3  (three) times daily with meals.   multivitamin with minerals Tabs tablet Take 1 tablet by mouth daily.   tamsulosin  0.4 MG Caps capsule Commonly known as: FLOMAX  Take 1 capsule (0.4 mg total) by mouth daily after breakfast.   thiamine  100 MG tablet Commonly known as: Vitamin B-1 Take 1 tablet (100 mg total) by mouth daily.   tiotropium 18 MCG inhalation capsule Commonly known as: SPIRIVA  Place 18 mcg into inhaler and inhale daily.   Torsemide  40 MG Tabs Take 80 mg by mouth daily. Start taking on: March 24, 2024 What changed:  medication strength how much to take when to take this reasons to take this   traZODone  50 MG tablet Commonly known as: DESYREL  Take 50 mg by mouth at bedtime.        Discharge Exam: Filed Weights   03/21/24 0315 03/22/24 0515 03/23/24 9347  Weight: 58.6 kg 57.2 kg 56.2 kg   BP (!) 96/57 (BP Location: Left Arm)   Pulse 91   Temp 97.6 F (36.4 C) (Oral)   Resp 18   Ht 6' 3 (1.905 m)   Wt 56.2 kg   SpO2 99%   BMI 15.49 kg/m  Neurology awake and alert ENT with mild pallor  Cardiovascular with S1 and S2 present and regular, with positive systolic murmur at the apex No JVD  Respiratory with no rales or wheezing, no rhonchi  Abdomen with no distention  No lower extremity edema    Condition at discharge: stable  The results of significant diagnostics from this hospitalization (including imaging, microbiology, ancillary and laboratory) are listed below for reference.   Imaging Studies: DG Chest Portable 1 View Result Date: 03/17/2024 CLINICAL DATA:  dyspnea. EXAM: PORTABLE CHEST 1 VIEW COMPARISON:  02/10/2024. FINDINGS: Mild-to-moderate diffuse pulmonary vascular congestion with basilar gradient. There are bilateral small to trace pleural effusions. Probable atelectatic changes at the lung bases. Bilateral lung fields are clear. No dense consolidation or lung collapse. No pneumothorax. Stable mildly enlarged cardio-mediastinal  silhouette. No acute osseous abnormalities. The soft tissues are within normal limits. IMPRESSION: Findings favor congestive heart failure/pulmonary edema. Electronically Signed   By: Ree Molt M.D.   On: 03/17/2024 17:36    Microbiology: Results for orders placed or performed during the hospital encounter of 03/17/24  Urine Culture (for pregnant, neutropenic or urologic patients or patients with an indwelling urinary catheter)     Status: Abnormal   Collection Time: 03/17/24  2:46 PM   Specimen: Urine, Clean Catch  Result Value Ref Range Status   Specimen Description URINE, CLEAN CATCH  Final   Special Requests   Final    NONE Performed at [facility], [address] [place], [place] [address]    Culture >=100,000 COLONIES/mL ENTEROCOCCUS FAECALIS (A)  Final   Report Status 03/20/2024 FINAL  Final   Organism ID, Bacteria ENTEROCOCCUS FAECALIS (A)  Final      Susceptibility   Enterococcus faecalis - MIC*    AMPICILLIN <=2 SENSITIVE Sensitive     NITROFURANTOIN <=16 SENSITIVE Sensitive     VANCOMYCIN 1 SENSITIVE Sensitive     * >=100,000 COLONIES/mL ENTEROCOCCUS FAECALIS    Labs: CBC: Recent Labs  Lab 03/17/24 1445 03/18/24 0252  WBC 6.9 6.7  NEUTROABS 5.5  --   HGB 8.7* 8.0*  HCT 29.1* 25.7*  MCV 76.2* 74.9*  PLT 107* 114*   Basic Metabolic Panel: Recent Labs  Lab 03/19/24 0213 03/20/24 0244 03/21/24 0620 03/22/24 0303 03/23/24 0329  NA 141 138 134* 133* 132*  K 3.4* 3.9 4.2 4.4 3.7  CL 103 102 100 93* 89*  CO2 23 29 27  32 30  GLUCOSE 115* 120* 105* 123* 116*  BUN 36* 39* 41* 49* 60*  CREATININE 2.24* 2.03* 2.04* 2.22* 2.15*  CALCIUM  8.3* 8.5* 8.5* 8.6* 8.4*  MG 1.5* 1.8 1.9  --  1.6*  PHOS  --   --   --  <1.0* 2.7   Liver Function Tests: Recent Labs  Lab 03/17/24 1445 03/18/24 0252 03/22/24 0303 03/23/24 0329  AST 54* 28  --   --   ALT 14 13  --   --   ALKPHOS 53 50  --   --   BILITOT 3.5* 2.8*  --   --   PROT 6.3* 5.6*  --   --    ALBUMIN 2.4* 2.0* 2.5* 2.4*   CBG: No results for input(s): GLUCAP in the last 168 hours.  Discharge time spent: greater than 30 minutes.  Signed: Elidia Toribio Furnace, MD [facility] 03/23/2024

## 2024-03-23 NOTE — Progress Notes (Signed)
 Mobility Specialist Progress Note:    03/23/24 0946  Mobility  Activity Ambulated with assistance;Pivoted/transferred from bed to chair  Level of Assistance Contact guard assist, steadying assist  Assistive Device Four wheel walker  Distance Ambulated (ft) 25 ft  Activity Response Tolerated fair  Mobility Referral Yes  Mobility visit 1 Mobility  Mobility Specialist Start Time (ACUTE ONLY) 0945  Mobility Specialist Stop Time (ACUTE ONLY) 1013  Mobility Specialist Time Calculation (min) (ACUTE ONLY) 28 min   Pt agreeable to session. Pt moving a little slower but able to move in bed w/o much assist. Needing light CGA to ambulate. No c/o any symptoms. Left pt in recliner w/ all needs met.   Venetia Keel Mobility Specialist Please Neurosurgeon or Rehab Office at [phone]

## 2024-03-24 LAB — RENAL FUNCTION PANEL
Albumin: 2.4 g/dL — ABNORMAL LOW (ref 3.5–5.0)
Anion gap: 9 (ref 5–15)
BUN: 64 mg/dL — ABNORMAL HIGH (ref 8–23)
CO2: 31 mmol/L (ref 22–32)
Calcium: 8.4 mg/dL — ABNORMAL LOW (ref 8.9–10.3)
Chloride: 92 mmol/L — ABNORMAL LOW (ref 98–111)
Creatinine, Ser: 2.14 mg/dL — ABNORMAL HIGH (ref 0.61–1.24)
GFR, Estimated: 32 mL/min — ABNORMAL LOW (ref >60)
Glucose, Bld: 149 mg/dL — ABNORMAL HIGH (ref 70–99)
Phosphorus: 1 mg/dL — CL (ref 2.5–4.6)
Potassium: 3.7 mmol/L (ref 3.5–5.1)
Sodium: 132 mmol/L — ABNORMAL LOW (ref 135–145)

## 2024-03-24 MED ORDER — K PHOS MONO-SOD PHOS DI & MONO 155-852-130 MG PO TABS
500.0000 mg | ORAL_TABLET | Freq: Two times a day (BID) | ORAL | Status: DC
  Administered 2024-03-24 – 2024-04-01 (×17): 500 mg via ORAL
  Filled 2024-03-24 (×17): qty 2

## 2024-03-24 NOTE — Progress Notes (Signed)
   03/24/24 0350  Provider Notification  Provider Name/Title C. Anderson  Date Provider Notified 03/24/24  Time Provider Notified 0350  Method of Notification  (secure chat)  Notification Reason Critical Result (phosphorus =1..0)  Test performed and critical result phos =1.0  Date Critical Result Received 03/24/24  Time Critical Result Received 0348  Provider response Other (Comment) (awaiting response)

## 2024-03-24 NOTE — TOC Progression Note (Addendum)
 Transition of Care [facility]) - Progression Note    Patient Details  Name: Joseph Tanner MRN: [id number] Date of Birth: 02-01-1951  [facility]) CM/SW Contact  [name], [place] Phone Number: 03/24/2024, 10:40 AM  Clinical Narrative:   Per [facility], insurance denied SNF authorization without peer to peer. [name] with [employer] provided CSW with fast appeal information (Phone: [phone]; Fax [phone]). CSW met with patient at bedside and called his daughter, Harlene. Patient and daughter would like to move forward with fast appeal. CSW informed patient and daughter that fast appeal will take approximately 72 hours for insurance to render a decision. CSW notified MD.   11:34 AM CSW submitted for fast appeal at this time.   3:50 PM CSW called (ref # [id number]) insurance to confirm appeal for SNF auth is being processed. Per [facility] representative, the appeal has been received and is being processed (case [phone]). Per [facility] rep, appeal decision will take approximately 72 business hours to be rendered.   CSW will continue to follow.    Expected Discharge Plan: Skilled Nursing Facility Barriers to Discharge: Insurance Authorization               Expected Discharge Plan and Services In-house Referral: Clinical Social Work, Artist     Living arrangements for the past 2 months: Single Family Home Expected Discharge Date: 03/24/24                                     Social Drivers of Health (SDOH) Interventions SDOH Screenings   Food Insecurity: No Food Insecurity (03/18/2024)  Housing: Unknown (03/18/2024)  Transportation Needs: No Transportation Needs (03/18/2024)  Utilities: Not At Risk (03/18/2024)  Alcohol Screen: Low Risk  (06/17/2023)  Financial Resource Strain: Low Risk  (06/17/2023)  Social Connections: Moderately Isolated (03/18/2024)  Tobacco Use: High Risk (02/13/2024)    Readmission Risk Interventions     02/11/2024   11:47 AM  Readmission Risk Prevention Plan  Transportation Screening Complete  PCP or Specialist Appt within 5-7 Days Complete  Home Care Screening Complete  Medication Review (RN CM) Complete

## 2024-03-24 NOTE — Progress Notes (Signed)
 Patient seen and examined, laying in bed, no distress.  No changes from DC summary as noted by Dr. Noralee yesterday, plan for discharge to SNF pending authorization  Sigurd Pac, MD

## 2024-03-24 NOTE — Care Management Important Message (Signed)
 Important Message  Patient Details  Name: Joseph Tanner MRN: [id number] Date of Birth: 08-31-1950   Important Message Given:        [name] 03/24/2024, 11:57 AM

## 2024-03-25 LAB — RENAL FUNCTION PANEL
Albumin: 2.4 g/dL — ABNORMAL LOW (ref 3.5–5.0)
Anion gap: 13 (ref 5–15)
BUN: 74 mg/dL — ABNORMAL HIGH (ref 8–23)
CO2: 31 mmol/L (ref 22–32)
Calcium: 8.4 mg/dL — ABNORMAL LOW (ref 8.9–10.3)
Chloride: 88 mmol/L — ABNORMAL LOW (ref 98–111)
Creatinine, Ser: 2.08 mg/dL — ABNORMAL HIGH (ref 0.61–1.24)
GFR, Estimated: 33 mL/min — ABNORMAL LOW (ref >60)
Glucose, Bld: 126 mg/dL — ABNORMAL HIGH (ref 70–99)
Phosphorus: 1.3 mg/dL — ABNORMAL LOW (ref 2.5–4.6)
Potassium: 3.6 mmol/L (ref 3.5–5.1)
Sodium: 132 mmol/L — ABNORMAL LOW (ref 135–145)

## 2024-03-25 LAB — MAGNESIUM: Magnesium: 1.8 mg/dL (ref 1.7–2.4)

## 2024-03-25 NOTE — Plan of Care (Signed)

## 2024-03-25 NOTE — Progress Notes (Signed)
 Occupational Therapy Treatment Patient Details Name: Joseph Tanner MRN: [id number] DOB: 1951/05/18 Today's Date: 03/25/2024   History of present illness 73 y.o male admitted 03/17/24 for c/o of diarrhea, LB edema & SOB with CHF exacerbation, PNA, + cocaine. PMHx: HTn, HLD, CHF, NSTEMI, CAD, COPD, CKD, and cocaine abuse   OT comments  This 73 yo male seen today and is at same level of A/S as he was last session; however this time from a SPC level instead of a RW level (so he is progressing). He will continue to benefit from acute OT with follow up from continued inpatient follow up therapy, <3 hours/day       If plan is discharge home, recommend the following:  A little help with walking and/or transfers;A little help with bathing/dressing/bathroom;Assistance with cooking/housework;Assist for transportation;Help with stairs or ramp for entrance   Equipment Recommendations  BSC/3in1       Precautions / Restrictions Precautions Precautions: Fall Recall of Precautions/Restrictions: Impaired Restrictions Weight Bearing Restrictions Per Provider Order: No       Mobility Bed Mobility Overal bed mobility: Modified Independent                  Transfers Overall transfer level: Needs assistance Equipment used: Rolling walker (2 wheels) Transfers: Sit to/from Stand Sit to Stand: Supervision           General transfer comment: ambulating up and down the hallway with SPC pt is CGA--switches hands intermttently for cane. Reports he feels more steady with RW     Balance Overall balance assessment: Needs assistance Sitting-balance support: No upper extremity supported, Feet supported Sitting balance-Leahy Scale: Good     Standing balance support: No upper extremity supported Standing balance-Leahy Scale: Fair Standing balance comment: standing at sink to wash hands and oral care                           ADL either performed or assessed with clinical  judgement   ADL Overall ADL's : Needs assistance/impaired     Grooming: Wash/dry hands;Supervision/safety;Standing;Oral care                   Toilet Transfer: Supervision/safety;Ambulation;Regular Toilet   Toileting- Architect and Hygiene: Supervision/safety;Sit to/from stand              Extremity/Trunk Assessment Upper Extremity Assessment Upper Extremity Assessment: Overall WFL for tasks assessed            Vision Baseline Vision/History: 0 No visual deficits Patient Visual Report: No change from baseline           Communication Communication Communication: No apparent difficulties   Cognition Arousal: Alert Behavior During Therapy: Impulsive Cognition: No apparent impairments                                 Following commands impaired: Follows multi-step commands inconsistently      Cueing   Cueing Techniques: Verbal cues             Pertinent Vitals/ Pain       Pain Assessment Pain Assessment: No/denies pain         Frequency  Min 1X/week        Progress Toward Goals  OT Goals(current goals can now be found in the care plan section)  Progress towards OT goals: Progressing toward goals (Same as last session,  however now at [facility] level instead of RW)  Acute Rehab OT Goals Patient Stated Goal: to go to rehab to work on being able to take care of myself again on my own OT Goal Formulation: With patient Time For Goal Achievement: 04/01/24 Potential to Achieve Goals: Good         AM-PAC OT 6 Clicks Daily Activity     Outcome Measure   Help from another person eating meals?: None Help from another person taking care of personal grooming?: A Little Help from another person toileting, which includes using toliet, bedpan, or urinal?: A Little Help from another person bathing (including washing, rinsing, drying)?: A Little Help from another person to put on and taking off regular upper body clothing?: A  Little Help from another person to put on and taking off regular lower body clothing?: A Little 6 Click Score: 19    End of Session Equipment Utilized During Treatment: Gait belt (SPC)  OT Visit Diagnosis: Unsteadiness on feet (R26.81);Muscle weakness (generalized) (M62.81)   Activity Tolerance Patient tolerated treatment well   Patient Left in bed;with call bell/phone within reach;with bed alarm set   Nurse Communication  (secure chat to RN and NT: emptied 200 ccs of urine from urinal and pt had a large bowel movement)        Time: 8662-8648 OT Time Calculation (min): 14 min  Charges: OT General Charges $OT Visit: 1 Visit OT Treatments $Self Care/Home Management : 8-22 mins  Joseph Tanner OT Acute Rehabilitation Services Office [phone]    Joseph Tanner 03/25/2024, 2:48 PM

## 2024-03-25 NOTE — TOC Progression Note (Signed)
 Transition of Care [facility]) - Progression Note    Patient Details  Name: Joseph Tanner MRN: [id number] Date of Birth: Jul 03, 1951  [facility] CM/SW Contact  [name], [place] Phone Number: 03/25/2024, 8:32 AM  Clinical Narrative:   [employer] called CSW to ask for updated appointment of representative form. CSW fax new form to insurance.   Late entry - 6:04PM: Sam with [employer] appeal department left VM for CSW stating insurance is processing appeal at this time.   CSW will continue to follow.    Expected Discharge Plan: Skilled Nursing Facility Barriers to Discharge: Insurance Authorization               Expected Discharge Plan and Services In-house Referral: Clinical Social Work, Artist     Living arrangements for the past 2 months: Single Family Home Expected Discharge Date: 03/24/24                                     Social Drivers of Health (SDOH) Interventions SDOH Screenings   Food Insecurity: No Food Insecurity (03/18/2024)  Housing: Unknown (03/18/2024)  Transportation Needs: No Transportation Needs (03/18/2024)  Utilities: Not At Risk (03/18/2024)  Alcohol Screen: Low Risk  (06/17/2023)  Financial Resource Strain: Low Risk  (06/17/2023)  Social Connections: Moderately Isolated (03/18/2024)  Tobacco Use: High Risk (02/13/2024)    Readmission Risk Interventions    02/11/2024   11:47 AM  Readmission Risk Prevention Plan  Transportation Screening Complete  PCP or Specialist Appt within 5-7 Days Complete  Home Care Screening Complete  Medication Review (RN CM) Complete

## 2024-03-25 NOTE — Progress Notes (Signed)
 EKG performed and placed in pt chart r/t central tele report of significant runs of non-sustained V-tach.

## 2024-03-25 NOTE — Significant Event (Signed)
 Rapid Response Event Note   Reason for Call :  Nonsustained VT RN wants a 2nd set of eyes. Initial Focused Assessment:  Pt lying in bed with lights off. He says he is fine. He doesn't want to be disturbed or assessed by anyone. Per RN, he is not having SOB/CP/dizziness and is not aware of anything happening with his heart.   HR-113(ST)  Interventions:  Done earlier in night PTA RRT: EKG RFP, Mg>K-3.6, Mg-1.8 Plan of Care:  RR RN unable to fully assess pt per pt request. His labs are resulted. Bedside RN to reach out to MD about lab results and possible need to replete K/Mg. Please call RRT if further assistance needed.  Event Summary:   MD Notified: Rathore notified earlier by bedside RN.  Call Time:2257 Arrival Time:2303 End Upfz:[phone]  Tish Graeme Piety, RN

## 2024-03-25 NOTE — Progress Notes (Signed)
 Physical Therapy Treatment Patient Details Name: Joseph Tanner MRN: [id number] DOB: 10/31/1950 Today's Date: 03/25/2024   History of Present Illness 73 y.o male admitted 03/17/24 for c/o of diarrhea, LB edema & SOB with CHF exacerbation, PNA, + cocaine. PMHx: HTn, HLD, CHF, NSTEMI, CAD, COPD, CKD, and cocaine abuse    PT Comments  Pt impulsive trying to get off bed attached to BP cord and bedrail up while naked despite cues, direction and urinal at hand to rush to bathroom on arrival. After toileting able to follow commands appropriately for repeated sit to stands for strengthening and gait limited by fatigue. Will continue to follow with Patient will benefit from continued inpatient follow up therapy, <3 hours/day to decreased fall risk, increased strength and safety awareness.     If plan is discharge home, recommend the following: A little help with walking and/or transfers;Two people to help with walking and/or transfers;Assistance with cooking/housework;Assist for transportation;Help with stairs or ramp for entrance;Supervision due to cognitive status;Direct supervision/assist for financial management   Can travel by private vehicle     Yes  Equipment Recommendations  None recommended by PT    Recommendations for Other Services       Precautions / Restrictions Precautions Precautions: Fall Recall of Precautions/Restrictions: Impaired     Mobility  Bed Mobility Overal bed mobility: Modified Independent             General bed mobility comments: HOB 30 degrees, scooting to end of bed to get around rail and briefly waited long enough for therapist to move rail before trying to stand    Transfers Overall transfer level: Needs assistance   Transfers: Sit to/from Stand Sit to Stand: Supervision           General transfer comment: pt standing to move away from bed with gown only on one arm and BP cuff attached to pt and monitor. Pt with max cues for safety but  did not stop and therapist removed lines. Pt able to stand from chair total of 6 times with reliance on UB    Ambulation/Gait Ambulation/Gait assistance: Contact guard assist Gait Distance (Feet): 50 Feet Assistive device: Straight cane Gait Pattern/deviations: Step-through pattern, Decreased stride length   Gait velocity interpretation: 1.31 - 2.62 ft/sec, indicative of limited community ambulator   General Gait Details: pt walked 30' in room initially without use of cane then second trial requested use of cane for 71' with CGA for safety, pt declined further distance   Stairs             Wheelchair Mobility     Tilt Bed    Modified Rankin (Stroke Patients Only)       Balance Overall balance assessment: Needs assistance Sitting-balance support: No upper extremity supported, Feet supported Sitting balance-Leahy Scale: Good     Standing balance support: No upper extremity supported Standing balance-Leahy Scale: Good                              Communication    Cognition Arousal: Alert Behavior During Therapy: Impulsive   PT - Cognitive impairments: Safety/Judgement                       PT - Cognition Comments: pt rushing to get to bathroom when awakened despite rail up on bed and naked Following commands: Impaired Following commands impaired: Follows one step commands inconsistently    Cueing Cueing  Techniques: Verbal cues  Exercises      General Comments        Pertinent Vitals/Pain Pain Assessment Pain Assessment: No/denies pain    Home Living                          Prior Function            PT Goals (current goals can now be found in the care plan section) Progress towards PT goals: Progressing toward goals    Frequency    Min 1X/week      PT Plan      Co-evaluation              AM-PAC PT 6 Clicks Mobility   Outcome Measure  Help needed turning from your back to your side while in a  flat bed without using bedrails?: None Help needed moving from lying on your back to sitting on the side of a flat bed without using bedrails?: A Little Help needed moving to and from a bed to a chair (including a wheelchair)?: A Little Help needed standing up from a chair using your arms (e.g., wheelchair or bedside chair)?: A Little Help needed to walk in hospital room?: A Little Help needed climbing 3-5 steps with a railing? : A Little 6 Click Score: 19    End of Session   Activity Tolerance: Patient tolerated treatment well Patient left: in chair;with call bell/phone within reach;with chair alarm set Nurse Communication: Mobility status PT Visit Diagnosis: Other abnormalities of gait and mobility (R26.89);Muscle weakness (generalized) (M62.81);Difficulty in walking, not elsewhere classified (R26.2)     Time: 9267-9245 PT Time Calculation (min) (ACUTE ONLY): 22 min  Charges:    $Gait Training: 8-22 mins PT General Charges $$ ACUTE PT VISIT: 1 Visit                     Lenoard SQUIBB, PT Acute Rehabilitation Services Office: [phone]    Mykah Bellomo B Desean Heemstra 03/25/2024, 10:08 AM

## 2024-03-25 NOTE — Progress Notes (Signed)
 Pt having extended, but intermittent, runs of v-tach. MD aware. Rapid Response will evaluate. Pt denies being symptomatic. Will continue to monitor.

## 2024-03-25 NOTE — Progress Notes (Signed)
 Pt seen and examined, no changes from prior discharge summary, SNF denied rehab, TOC following, appeal started yesterday  Sigurd Pac, MD

## 2024-03-26 DIAGNOSIS — I5023 Acute on chronic systolic (congestive) heart failure: Secondary | ICD-10-CM | POA: Diagnosis not present

## 2024-03-26 MED ORDER — TORSEMIDE 20 MG PO TABS
40.0000 mg | ORAL_TABLET | Freq: Every day | ORAL | Status: DC
  Administered 2024-03-26 – 2024-03-29 (×4): 40 mg via ORAL
  Filled 2024-03-26 (×4): qty 2

## 2024-03-26 MED ORDER — K PHOS MONO-SOD PHOS DI & MONO 155-852-130 MG PO TABS
500.0000 mg | ORAL_TABLET | Freq: Once | ORAL | Status: AC
  Administered 2024-03-26: 500 mg via ORAL
  Filled 2024-03-26: qty 2

## 2024-03-26 MED ORDER — MAGNESIUM SULFATE 2 GM/50ML IV SOLN
2.0000 g | Freq: Once | INTRAVENOUS | Status: AC
  Administered 2024-03-26: 2 g via INTRAVENOUS
  Filled 2024-03-26: qty 50

## 2024-03-26 MED ORDER — POTASSIUM CHLORIDE CRYS ER 20 MEQ PO TBCR
40.0000 meq | EXTENDED_RELEASE_TABLET | Freq: Two times a day (BID) | ORAL | Status: AC
Start: 2024-03-26 — End: 2024-03-26
  Administered 2024-03-26 (×2): 40 meq via ORAL
  Filled 2024-03-26 (×2): qty 2

## 2024-03-26 NOTE — Progress Notes (Signed)
 Tele reported that patient had 22 beat run of V-tach around 10:40 am.  Vital signs remain stable. Patient reported feeling tired but denied chest pain, shortness of breath or other discomfort. MD notified. Will continue to monitor patient.

## 2024-03-26 NOTE — Progress Notes (Signed)
   03/26/24 1230  Spiritual Encounters  Type of Visit Initial  Care provided to: Pt and family  Reason for visit Advance directives  OnCall Visit No  Spiritual Framework  Presenting Themes Impactful experiences and emotions;Significant life change  Community/Connection Family  Patient Stress Factors Health changes  Family Stress Factors Health changes  Interventions  Spiritual Care Interventions Made Compassionate presence;Established relationship of care and support  Intervention Outcomes  Outcomes Awareness of health;Awareness of support;Reduced anxiety  Mental Health Advance Directives  Does Patient Have a Mental Health Advance Directive? No   Chaplain met with Pt and daughter to assist in the completion of the advance directive and ensured accuracy of information. A copy  of the complete document was uploaded into the Pt's medical record.  The pt was given the original document along with additional copies for personal use. Pt voiced appreciation for the support provided. Chaplain remains available for ongoing emotional and spiritual support as needed.

## 2024-03-26 NOTE — Progress Notes (Signed)
 PROGRESS NOTE    Joseph Tanner  FMW:[id number] DOB: Dec 03, 1950 DOA: 03/17/2024 PCP: Pcp, No  73/M with chronic systolic CHF, CAD, COPD, CKD, cocaine use recent hospitalization for CHF exacerbation, discharged to SNF, then sent home as he was unable to participate with PT. - Poor compliance with meds after going home, readmitted with worsening edema dyspnea lightheadedness, noted to be significantly volume overloaded.  -Improved with diuresis, stabilized plan for discharge to SNF - Discharge completed 8/26, insurance declined, family initiated appeal   Subjective: -Feels okay, no events overnight  Assessment and Plan:   Acute on chronic systolic CHF Biventricular failure -Echocardiogram with reduced LV systolic function with EF 20 to 25%, global hypokinesis, severe dilatation LV cavity, mild LVH, RV systolic function with severe reduction,  - During hospitalization in July there was discussion on hospice, patient declined this, went to rehab and then home and quickly readmitted - Poor insight, has end-stage disease wishes to go to rehab again - Improved with diuresis, 8.4 L negative, continue torsemide  and midodrine  - GDMT limited by hypotension  Coronary artery disease - Stable, add aspirin , continue statin.   CKD (chronic kidney disease) stage 4, GFR 15-29 ml/min (HCC) Hyponatremia, hypokalemia, hypomagnesemia. hypoP  -Stable kidney function at baseline  Pure hypercholesterolemia Continue statin  Pancytopenia (HCC) Continue DVT prophylaxis with heparin  sq  Follow up hgb is 8,0 with plt 114 and wbc 6.7   Protein-calorie malnutrition, severe Continue nutritional supplements   Cocaine abuse (HCC) Counseled  UTI (urinary tract infection) Positive urine culture and positive pyuria on urine analysis Urine culture positive for enterococcus fecalis.  Sensitive to ampicillin, treated with antibiotic therapy with Augmentin  for 3 days    DVT prophylaxis: Add  Lovenox  Code Status: Full code Family Communication: None present Disposition Plan: SNF versus home if appealed denied  Consultants:    Procedures:   Antimicrobials:    Objective: Vitals:   03/25/24 2354 03/26/24 0443 03/26/24 0745 03/26/24 1023  BP: 102/70 99/75 101/79 99/63  Pulse:  83 98   Resp: (!) 26 18 18    Temp: 98.3 F (36.8 C) 98 F (36.7 C) (!) 97.5 F (36.4 C)   TempSrc: Oral Oral Oral   SpO2: 95% 97% 100%   Weight:  57.7 kg    Height:        Intake/Output Summary (Last 24 hours) at 03/26/2024 1152 Last data filed at 03/26/2024 0455 Gross per 24 hour  Intake 130 ml  Output 750 ml  Net -620 ml   Filed Weights   03/24/24 0341 03/25/24 0500 03/26/24 0443  Weight: 59.3 kg 59.3 kg 57.7 kg    Examination:  General exam: Appears calm and comfortable chronically ill and cachectic HEENT: No JVD Respiratory system: Clear to auscultation Cardiovascular system: S1 & S2 heard, RRR.  Abd: nondistended, soft and nontender.Normal bowel sounds heard. Central nervous system: Alert and oriented. No focal neurological deficits. Extremities: no edema Skin: No rashes Psychiatry:  Mood & affect appropriate.     Data Reviewed:   CBC: No results for input(s): WBC, NEUTROABS, HGB, HCT, MCV, PLT in the last 168 hours. Basic Metabolic Panel: Recent Labs  Lab 03/20/24 0244 03/21/24 0620 03/22/24 0303 03/23/24 0329 03/24/24 0235 03/25/24 2145  NA 138 134* 133* 132* 132* 132*  K 3.9 4.2 4.4 3.7 3.7 3.6  CL 102 100 93* 89* 92* 88*  CO2 29 27 32 30 31 31   GLUCOSE 120* 105* 123* 116* 149* 126*  BUN 39* 41* 49* 60* 64*  74*  CREATININE 2.03* 2.04* 2.22* 2.15* 2.14* 2.08*  CALCIUM  8.5* 8.5* 8.6* 8.4* 8.4* 8.4*  MG 1.8 1.9  --  1.6*  --  1.8  PHOS  --   --  <1.0* 2.7 1.0* 1.3*   GFR: Estimated Creatinine Clearance: 25.8 mL/min (A) (by C-G formula based on SCr of 2.08 mg/dL (H)). Liver Function Tests: Recent Labs  Lab 03/22/24 0303 03/23/24 0329  03/24/24 0235 03/25/24 2145  ALBUMIN 2.5* 2.4* 2.4* 2.4*   No results for input(s): LIPASE, AMYLASE in the last 168 hours. No results for input(s): AMMONIA in the last 168 hours. Coagulation Profile: No results for input(s): INR, PROTIME in the last 168 hours. Cardiac Enzymes: No results for input(s): CKTOTAL, CKMB, CKMBINDEX, TROPONINI in the last 168 hours. BNP (last 3 results) No results for input(s): PROBNP in the last 8760 hours. HbA1C: No results for input(s): HGBA1C in the last 72 hours. CBG: Recent Labs  Lab 03/23/24 2113  GLUCAP 149*   Lipid Profile: No results for input(s): CHOL, HDL, LDLCALC, TRIG, CHOLHDL, LDLDIRECT in the last 72 hours. Thyroid Function Tests: No results for input(s): TSH, T4TOTAL, FREET4, T3FREE, THYROIDAB in the last 72 hours. Anemia Panel: No results for input(s): VITAMINB12, FOLATE, FERRITIN, TIBC, IRON, RETICCTPCT in the last 72 hours. Urine analysis:    Component Value Date/Time   COLORURINE AMBER (A) 03/17/2024 1446   APPEARANCEUR HAZY (A) 03/17/2024 1446   APPEARANCEUR Clear 11/30/2020 1006   LABSPEC 1.014 03/17/2024 1446   PHURINE 5.0 03/17/2024 1446   GLUCOSEU NEGATIVE 03/17/2024 1446   HGBUR NEGATIVE 03/17/2024 1446   BILIRUBINUR NEGATIVE 03/17/2024 1446   BILIRUBINUR Negative 11/30/2020 1006   KETONESUR NEGATIVE 03/17/2024 1446   PROTEINUR 30 (A) 03/17/2024 1446   NITRITE NEGATIVE 03/17/2024 1446   LEUKOCYTESUR LARGE (A) 03/17/2024 1446   Sepsis Labs: @LABRCNTIP (procalcitonin:4,lacticidven:4)  ) Recent Results (from the past 240 hours)  Urine Culture (for pregnant, neutropenic or urologic patients or patients with an indwelling urinary catheter)     Status: Abnormal   Collection Time: 03/17/24  2:46 PM   Specimen: Urine, Clean Catch  Result Value Ref Range Status   Specimen Description URINE, CLEAN CATCH  Final   Special Requests   Final    NONE Performed at [facility], [address] [place], [place] [address]    Culture >=100,000 COLONIES/mL ENTEROCOCCUS FAECALIS (A)  Final   Report Status 03/20/2024 FINAL  Final   Organism ID, Bacteria ENTEROCOCCUS FAECALIS (A)  Final      Susceptibility   Enterococcus faecalis - MIC*    AMPICILLIN <=2 SENSITIVE Sensitive     NITROFURANTOIN <=16 SENSITIVE Sensitive     VANCOMYCIN 1 SENSITIVE Sensitive     * >=100,000 COLONIES/mL ENTEROCOCCUS FAECALIS     Radiology Studies: No results found.   Scheduled Meds:  atorvastatin   40 mg Oral QPM   feeding supplement  237 mL Oral TID BM   folic acid   1 mg Oral Daily   LORazepam   1 mg Oral QHS   midodrine   5 mg Oral TID WC   multivitamin with minerals  1 tablet Oral Daily   phosphorus  500 mg Oral BID   potassium chloride   40 mEq Oral BID   sodium chloride  flush  10-40 mL Intracatheter Q12H   tamsulosin   0.4 mg Oral QPC breakfast   torsemide   40 mg Oral Daily   umeclidinium bromide   1 puff Inhalation Daily   Continuous Infusions:  magnesium  sulfate  bolus IVPB       LOS: 7 days    Time spent:    Sigurd Pac, MD [facility]   03/26/2024, 11:52 AM

## 2024-03-27 DIAGNOSIS — I5023 Acute on chronic systolic (congestive) heart failure: Secondary | ICD-10-CM | POA: Diagnosis not present

## 2024-03-27 LAB — BASIC METABOLIC PANEL WITH GFR
Anion gap: 13 (ref 5–15)
BUN: 79 mg/dL — ABNORMAL HIGH (ref 8–23)
CO2: 31 mmol/L (ref 22–32)
Calcium: 8.7 mg/dL — ABNORMAL LOW (ref 8.9–10.3)
Chloride: 91 mmol/L — ABNORMAL LOW (ref 98–111)
Creatinine, Ser: 2.18 mg/dL — ABNORMAL HIGH (ref 0.61–1.24)
GFR, Estimated: 31 mL/min — ABNORMAL LOW (ref >60)
Glucose, Bld: 126 mg/dL — ABNORMAL HIGH (ref 70–99)
Potassium: 4.3 mmol/L (ref 3.5–5.1)
Sodium: 135 mmol/L (ref 135–145)

## 2024-03-27 LAB — MAGNESIUM: Magnesium: 2.2 mg/dL (ref 1.7–2.4)

## 2024-03-27 NOTE — Progress Notes (Signed)
 Patient had a 21 beat run of V-tach at 12 pm and again at 3 pm. Vital signs remained stable. Asymptomatic. MD notified.

## 2024-03-27 NOTE — Progress Notes (Signed)
 PROGRESS NOTE    Joseph Tanner  FMW:[id number] DOB: Feb 15, 1951 DOA: 03/17/2024 PCP: Pcp, No  73/M with chronic systolic CHF, CAD, COPD, CKD, cocaine use recent hospitalization for CHF exacerbation, discharged to SNF, then sent home as he was unable to participate with PT. - Poor compliance with meds after going home, readmitted with worsening edema dyspnea lightheadedness, noted to be significantly volume overloaded.  -Improved with diuresis, stabilized plan for discharge to SNF - Discharge completed 8/26, insurance declined, family initiated appeal   Subjective: -Feels okay, no events overnight  Assessment and Plan:   Acute on chronic systolic CHF Biventricular failure -Echocardiogram with reduced LV systolic function with EF 20 to 25%, global hypokinesis, severe dilatation LV cavity, mild LVH, RV systolic function with severe reduction,  - During hospitalization in July there was discussion on hospice, followed by heart failure team, prognosis felt to be very poor, not a candidate for advanced therapies > eventually went to rehab and then home and quickly readmitted - Poor insight, has end-stage disease wishes to go to rehab again - Improved with diuresis, 8.4 L negative, continue torsemide  and midodrine  - GDMT limited by hypotension - Discussed poor prognosis again, agreeable to DNR  Nonsustained V. Tach - Secondary to severe cardiomyopathy, poor prognosis - Unable to tolerate beta-blocker with hypotension - Repeat mag level  Coronary artery disease - Stable, add aspirin , continue statin.   CKD (chronic kidney disease) stage 4, GFR 15-29 ml/min (HCC) Hyponatremia, hypokalemia, hypomagnesemia. hypoP  -Stable kidney function at baseline  Pure hypercholesterolemia Continue statin  Pancytopenia (HCC) Continue DVT prophylaxis with heparin  sq  Follow up hgb is 8,0 with plt 114 and wbc 6.7   Protein-calorie malnutrition, severe Continue nutritional supplements    Cocaine abuse (HCC) Counseled  UTI (urinary tract infection) Positive urine culture and positive pyuria on urine analysis Urine culture positive for enterococcus fecalis.  Sensitive to ampicillin, treated with antibiotic therapy with Augmentin  for 3 days    DVT prophylaxis: Add Lovenox  Code Status: DNR Family Communication: None present Disposition Plan: SNF versus home if appealed denied  Consultants:    Procedures:   Antimicrobials:    Objective: Vitals:   03/27/24 0251 03/27/24 0254 03/27/24 0752 03/27/24 0900  BP:  102/65 101/64   Pulse:   90 89  Resp: (!) 28 20 14  (!) 27  Temp:  98 F (36.7 C) 98.1 F (36.7 C)   TempSrc:  Oral Oral   SpO2:  100% 99% 99%  Weight: 58.1 kg     Height:        Intake/Output Summary (Last 24 hours) at 03/27/2024 1050 Last data filed at 03/27/2024 0900 Gross per 24 hour  Intake 600 ml  Output 525 ml  Net 75 ml   Filed Weights   03/25/24 0500 03/26/24 0443 03/27/24 0251  Weight: 59.3 kg 57.7 kg 58.1 kg    Examination:  General exam: Appears calm and comfortable chronically ill and cachectic HEENT: No JVD Respiratory system: Clear to auscultation Cardiovascular system: S1 & S2 heard, RRR.  Abd: nondistended, soft and nontender.Normal bowel sounds heard. Central nervous system: Alert and oriented. No focal neurological deficits. Extremities: no edema Skin: No rashes Psychiatry:  Mood & affect appropriate.     Data Reviewed:   CBC: No results for input(s): WBC, NEUTROABS, HGB, HCT, MCV, PLT in the last 168 hours. Basic Metabolic Panel: Recent Labs  Lab 03/21/24 0620 03/22/24 0303 03/23/24 0329 03/24/24 0235 03/25/24 2145 03/27/24 0245  NA 134* 133* 132*  132* 132* 135  K 4.2 4.4 3.7 3.7 3.6 4.3  CL 100 93* 89* 92* 88* 91*  CO2 27 32 30 31 31 31   GLUCOSE 105* 123* 116* 149* 126* 126*  BUN 41* 49* 60* 64* 74* 79*  CREATININE 2.04* 2.22* 2.15* 2.14* 2.08* 2.18*  CALCIUM  8.5* 8.6* 8.4* 8.4* 8.4*  8.7*  MG 1.9  --  1.6*  --  1.8  --   PHOS  --  <1.0* 2.7 1.0* 1.3*  --    GFR: Estimated Creatinine Clearance: 24.8 mL/min (A) (by C-G formula based on SCr of 2.18 mg/dL (H)). Liver Function Tests: Recent Labs  Lab 03/22/24 0303 03/23/24 0329 03/24/24 0235 03/25/24 2145  ALBUMIN 2.5* 2.4* 2.4* 2.4*   No results for input(s): LIPASE, AMYLASE in the last 168 hours. No results for input(s): AMMONIA in the last 168 hours. Coagulation Profile: No results for input(s): INR, PROTIME in the last 168 hours. Cardiac Enzymes: No results for input(s): CKTOTAL, CKMB, CKMBINDEX, TROPONINI in the last 168 hours. BNP (last 3 results) No results for input(s): PROBNP in the last 8760 hours. HbA1C: No results for input(s): HGBA1C in the last 72 hours. CBG: Recent Labs  Lab 03/23/24 2113  GLUCAP 149*   Lipid Profile: No results for input(s): CHOL, HDL, LDLCALC, TRIG, CHOLHDL, LDLDIRECT in the last 72 hours. Thyroid Function Tests: No results for input(s): TSH, T4TOTAL, FREET4, T3FREE, THYROIDAB in the last 72 hours. Anemia Panel: No results for input(s): VITAMINB12, FOLATE, FERRITIN, TIBC, IRON, RETICCTPCT in the last 72 hours. Urine analysis:    Component Value Date/Time   COLORURINE AMBER (A) 03/17/2024 1446   APPEARANCEUR HAZY (A) 03/17/2024 1446   APPEARANCEUR Clear 11/30/2020 1006   LABSPEC 1.014 03/17/2024 1446   PHURINE 5.0 03/17/2024 1446   GLUCOSEU NEGATIVE 03/17/2024 1446   HGBUR NEGATIVE 03/17/2024 1446   BILIRUBINUR NEGATIVE 03/17/2024 1446   BILIRUBINUR Negative 11/30/2020 1006   KETONESUR NEGATIVE 03/17/2024 1446   PROTEINUR 30 (A) 03/17/2024 1446   NITRITE NEGATIVE 03/17/2024 1446   LEUKOCYTESUR LARGE (A) 03/17/2024 1446   Sepsis Labs: @LABRCNTIP (procalcitonin:4,lacticidven:4)  ) Recent Results (from the past 240 hours)  Urine Culture (for pregnant, neutropenic or urologic patients or patients with an  indwelling urinary catheter)     Status: Abnormal   Collection Time: 03/17/24  2:46 PM   Specimen: Urine, Clean Catch  Result Value Ref Range Status   Specimen Description URINE, CLEAN CATCH  Final   Special Requests   Final    NONE Performed at [facility], [address], [place], [place] [address]    Culture >=100,000 COLONIES/mL ENTEROCOCCUS FAECALIS (A)  Final   Report Status 03/20/2024 FINAL  Final   Organism ID, Bacteria ENTEROCOCCUS FAECALIS (A)  Final      Susceptibility   Enterococcus faecalis - MIC*    AMPICILLIN <=2 SENSITIVE Sensitive     NITROFURANTOIN <=16 SENSITIVE Sensitive     VANCOMYCIN 1 SENSITIVE Sensitive     * >=100,000 COLONIES/mL ENTEROCOCCUS FAECALIS     Radiology Studies: No results found.   Scheduled Meds:  atorvastatin   40 mg Oral QPM   feeding supplement  237 mL Oral TID BM   folic acid   1 mg Oral Daily   LORazepam   1 mg Oral QHS   midodrine   5 mg Oral TID WC   multivitamin with minerals  1 tablet Oral Daily   phosphorus  500 mg Oral BID   sodium chloride  flush  10-40 mL  Intracatheter Q12H   tamsulosin   0.4 mg Oral QPC breakfast   torsemide   40 mg Oral Daily   umeclidinium bromide   1 puff Inhalation Daily   Continuous Infusions:     LOS: 8 days    Time spent:    Sigurd Pac, MD [facility]   03/27/2024, 10:50 AM

## 2024-03-27 NOTE — Progress Notes (Signed)
 Notified by charge nurse patient had 21 beats run of vtach, pt walking to the bathroom, denies chest pain, SOB. MD made aware.

## 2024-03-28 LAB — BASIC METABOLIC PANEL WITH GFR
Anion gap: 14 (ref 5–15)
BUN: 78 mg/dL — ABNORMAL HIGH (ref 8–23)
CO2: 28 mmol/L (ref 22–32)
Calcium: 8.8 mg/dL — ABNORMAL LOW (ref 8.9–10.3)
Chloride: 91 mmol/L — ABNORMAL LOW (ref 98–111)
Creatinine, Ser: 2.17 mg/dL — ABNORMAL HIGH (ref 0.61–1.24)
GFR, Estimated: 31 mL/min — ABNORMAL LOW (ref >60)
Glucose, Bld: 112 mg/dL — ABNORMAL HIGH (ref 70–99)
Potassium: 3.5 mmol/L (ref 3.5–5.1)
Sodium: 133 mmol/L — ABNORMAL LOW (ref 135–145)

## 2024-03-28 LAB — MAGNESIUM: Magnesium: 2.1 mg/dL (ref 1.7–2.4)

## 2024-03-28 MED ORDER — POTASSIUM CHLORIDE CRYS ER 20 MEQ PO TBCR
60.0000 meq | EXTENDED_RELEASE_TABLET | Freq: Once | ORAL | Status: AC
  Administered 2024-03-28: 60 meq via ORAL
  Filled 2024-03-28: qty 3

## 2024-03-28 NOTE — Plan of Care (Signed)

## 2024-03-28 NOTE — Progress Notes (Signed)
 Patient seen and examined, no changes from my note yesterday -Remains medically stable -SNF appeal ongoing, anticipate discharge to SNF versus home tomorrow  Sigurd Pac, MD

## 2024-03-29 NOTE — Progress Notes (Signed)
 Mobility Specialist Progress Note:    03/29/24 1541  Mobility  Activity Ambulated with assistance  Level of Assistance Contact guard assist, steadying assist  Assistive Device Front wheel walker  Distance Ambulated (ft) 25 ft  Activity Response Tolerated well  Mobility Referral Yes  Mobility visit 1 Mobility  Mobility Specialist Start Time (ACUTE ONLY) 1541  Mobility Specialist Stop Time (ACUTE ONLY) 1549  Mobility Specialist Time Calculation (min) (ACUTE ONLY) 8 min   Pt requesting assistance w/ bed and decided to do small walk. No c/o any symptoms. Pt energetic and feeling better d/t being d/c. Returned pt sitting on EOB w/ all needs met.   Venetia Keel Mobility Specialist Please Neurosurgeon or Rehab Office at [phone]

## 2024-03-29 NOTE — Progress Notes (Signed)
 Patient complains of SOB this AM. Oxygen sat on RA is 98%. Pt states he wears oxygen at home as needed. 2L [place] placed for comfort.

## 2024-03-29 NOTE — Plan of Care (Signed)

## 2024-03-29 NOTE — Progress Notes (Signed)
 Physical Therapy Treatment Patient Details Name: Joseph Tanner MRN: [id number] DOB: 1950/10/13 Today's Date: 03/29/2024   History of Present Illness 73 y.o male admitted 03/17/24 for c/o of diarrhea, LB edema & SOB with CHF exacerbation, PNA, + cocaine. PMHx: HTn, HLD, CHF, NSTEMI, CAD, COPD, CKD, and cocaine abuse    PT Comments  Pt pleasant and able to increase gait distance with use of RW. Pt demonstrating good strength and transfers but remains limited by cognition and awareness. Pt reports daughter works and he cannot stay with her or her with him. Pt would benefit from supervision for cognition and continued safety. If post acute care not approved would maximize [facility] services.     If plan is discharge home, recommend the following: A little help with walking and/or transfers;Assistance with cooking/housework;Assist for transportation;Supervision due to cognitive status;Direct supervision/assist for financial management   Can travel by private vehicle        Equipment Recommendations  None recommended by PT    Recommendations for Other Services       Precautions / Restrictions Precautions Precautions: Fall Recall of Precautions/Restrictions: Impaired     Mobility  Bed Mobility Overal bed mobility: Modified Independent             General bed mobility comments: HOB 20 degrees, assist to move rail    Transfers Overall transfer level: Modified independent                 General transfer comment: pt able to rise from bed without assist and perform 5 repeated sit to stands with UB support at chair    Ambulation/Gait Ambulation/Gait assistance: Contact guard assist Gait Distance (Feet): 200 Feet Assistive device: Rolling walker (2 wheels) Gait Pattern/deviations: Step-through pattern, Decreased stride length   Gait velocity interpretation: 1.31 - 2.62 ft/sec, indicative of limited community ambulator   General Gait Details: pt walking in room without  support but refused use of cane for hall ambulation and demanded RW. With RW pt with steady gait mod I, with pt taking seated rest prior to return. 200' x 2 trials   Stairs             Wheelchair Mobility     Tilt Bed    Modified Rankin (Stroke Patients Only)       Balance Overall balance assessment: Needs assistance Sitting-balance support: No upper extremity supported, Feet supported Sitting balance-Leahy Scale: Good     Standing balance support: No upper extremity supported Standing balance-Leahy Scale: Good Standing balance comment: standing at sink to wash hands and toilet without support                            Communication Communication Communication: No apparent difficulties  Cognition Arousal: Alert Behavior During Therapy: Impulsive   PT - Cognitive impairments: Safety/Judgement, Memory                       PT - Cognition Comments: pt oriented to self, time and place yet 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 Pt able to eventually state he would call 911 with a fire after prompting, pt denied attempting gait without RW Following commands: Impaired Following commands impaired: Follows multi-step commands inconsistently    Cueing Cueing Techniques: Verbal cues  Exercises      General Comments        Pertinent Vitals/Pain Pain Assessment Pain Assessment: No/denies pain    Home Living  Prior Function            PT Goals (current goals can now be found in the care plan section) Progress towards PT goals: Progressing toward goals    Frequency    Min 1X/week      PT Plan      Co-evaluation              AM-PAC PT 6 Clicks Mobility   Outcome Measure  Help needed turning from your back to your side while in a flat bed without using bedrails?: None Help needed moving from lying on your back to sitting on the side of a flat bed without using bedrails?: None Help needed moving to  and from a bed to a chair (including a wheelchair)?: None Help needed standing up from a chair using your arms (e.g., wheelchair or bedside chair)?: A Little Help needed to walk in hospital room?: A Little Help needed climbing 3-5 steps with a railing? : A Little 6 Click Score: 21    End of Session   Activity Tolerance: Patient tolerated treatment well Patient left: in chair;with call bell/phone within reach Nurse Communication: Mobility status PT Visit Diagnosis: Other abnormalities of gait and mobility (R26.89);Muscle weakness (generalized) (M62.81);Difficulty in walking, not elsewhere classified (R26.2)     Time: 8940-8880 PT Time Calculation (min) (ACUTE ONLY): 20 min  Charges:    $Gait Training: 8-22 mins PT General Charges $$ ACUTE PT VISIT: 1 Visit                     Joseph Tanner, PT Acute Rehabilitation Services Office: [phone]    Joseph NOVAK Shonna Deiter 03/29/2024, 12:01 PM

## 2024-03-29 NOTE — Progress Notes (Signed)
 Patient seen and examined, no changes from my notes -Medically stable for discharge to SNF, appeal ongoing, TOC following, if this is denied patient agrees to discharge home with home health services and outpatient palliative care  Sigurd Pac, MD

## 2024-03-30 ENCOUNTER — Other Ambulatory Visit (HOSPITAL_COMMUNITY): Payer: Self-pay

## 2024-03-30 DIAGNOSIS — I5023 Acute on chronic systolic (congestive) heart failure: Secondary | ICD-10-CM | POA: Diagnosis not present

## 2024-03-30 LAB — BASIC METABOLIC PANEL WITH GFR
Anion gap: 14 (ref 5–15)
BUN: 88 mg/dL — ABNORMAL HIGH (ref 8–23)
CO2: 30 mmol/L (ref 22–32)
Calcium: 8.7 mg/dL — ABNORMAL LOW (ref 8.9–10.3)
Chloride: 92 mmol/L — ABNORMAL LOW (ref 98–111)
Creatinine, Ser: 2.28 mg/dL — ABNORMAL HIGH (ref 0.61–1.24)
GFR, Estimated: 30 mL/min — ABNORMAL LOW (ref >60)
Glucose, Bld: 111 mg/dL — ABNORMAL HIGH (ref 70–99)
Potassium: 4.1 mmol/L (ref 3.5–5.1)
Sodium: 136 mmol/L (ref 135–145)

## 2024-03-30 LAB — MAGNESIUM: Magnesium: 2 mg/dL (ref 1.7–2.4)

## 2024-03-30 MED ORDER — TORSEMIDE 20 MG PO TABS
40.0000 mg | ORAL_TABLET | Freq: Every day | ORAL | Status: DC
  Administered 2024-03-31 – 2024-04-01 (×2): 40 mg via ORAL
  Filled 2024-03-30 (×2): qty 2

## 2024-03-30 MED ORDER — TORSEMIDE 40 MG PO TABS
40.0000 mg | ORAL_TABLET | Freq: Every day | ORAL | 1 refills | Status: DC
  Filled 2024-03-30: qty 60, fill #0

## 2024-03-30 MED ORDER — MIDODRINE HCL 5 MG PO TABS
5.0000 mg | ORAL_TABLET | Freq: Three times a day (TID) | ORAL | 0 refills | Status: DC
  Filled 2024-03-30: qty 90, 30d supply, fill #0

## 2024-03-30 NOTE — Progress Notes (Signed)
 Mobility Specialist Progress Note:    03/30/24 1524  Mobility  Activity Ambulated with assistance  Level of Assistance Independent after set-up  Assistive Device None  Distance Ambulated (ft) 100 ft  Activity Response Tolerated fair  Mobility Referral Yes  Mobility visit 1 Mobility  Mobility Specialist Start Time (ACUTE ONLY) 1500  Mobility Specialist Stop Time (ACUTE ONLY) 1506  Mobility Specialist Time Calculation (min) (ACUTE ONLY) 6 min   Pt laying in bed agreeable to session. No c/o any symptoms. Pt able to move in/out bed and ambulate w/o assist. Returned pt to bed w/o all needs met.   Venetia Keel Mobility Specialist Please Neurosurgeon or Rehab Office at [phone]

## 2024-03-30 NOTE — Discharge Summary (Signed)
 Physician Discharge Summary  Joseph Tanner DOB: October 15, 1950 DOA: 03/17/2024  PCP: Pcp, No  Admit date: 03/17/2024 Discharge date: 04/01/2024  Discharge summary per Dr Fairy.   Time spent: 45 minutes  Recommendations for Outpatient Follow-up:  Advanced heart failure clinic 9/5 Outpatient palliative care Continue goals of care conversations, prognosis is poor   Discharge Diagnoses:  Principal Problem:   Acute on chronic systolic CHF (congestive heart failure) (HCC) Severe biventricular failure Nonsustained ventricular tachycardia   Coronary artery disease   CKD (chronic kidney disease) stage 4, GFR 15-29 ml/min (HCC)   Pure hypercholesterolemia   Pancytopenia (HCC)   Protein-calorie malnutrition, severe   Cocaine abuse (HCC)   UTI (urinary tract infection)   Discharge Condition: improved  Diet recommendation: Low-sodium, heart healthy  Filed Weights   03/29/24 0436 03/30/24 0036 03/30/24 0336  Weight: 59.5 kg 60.4 kg 60.4 kg    History of present illness:  73/M with chronic systolic CHF, CAD, COPD, CKD, cocaine use recent hospitalization for CHF exacerbation, discharged to SNF, then sent home as he was unable to participate with PT. - Poor compliance with meds after going home, readmitted with worsening edema dyspnea lightheadedness, noted to be significantly volume overloaded.  -Improved with diuresis, stabilized plan for discharge to SNF - Discharge completed 8/26, insurance declined, family initiated appeal    Hospital Course:   Acute on chronic systolic CHF Severe biventricular failure -Echocardiogram with reduced LV systolic function with EF 20 to 25%, global hypokinesis, severe dilatation LV cavity, mild LVH, RV systolic function with severe reduction,  - During hospitalization in July there was discussion on hospice, followed by heart failure team, prognosis felt to be very poor, not a candidate for advanced therapies > eventually went to  rehab and then home and quickly readmitted - Poor insight, has end-stage disease wishes to go to rehab again - Improved with diuresis, 8.4 L negative, continue torsemide  and midodrine  - GDMT limited by hypotension - Discussed poor prognosis again, agreeable to DNR - Follow-up with Dr. Zenaida in advanced heart failure clinic   Nonsustained V. Tach - Secondary to severe cardiomyopathy, poor prognosis - Unable to tolerate beta-blocker with hypotension - Repeat mag level   Coronary artery disease - Stable, add aspirin , continue statin.    CKD (chronic kidney disease) stage 4, GFR 15-29 ml/min (HCC) Hyponatremia, hypokalemia, hypomagnesemia. hypoP  -Stable kidney function at baseline   Pure hypercholesterolemia Continue statin   Pancytopenia (HCC) Continue DVT prophylaxis with heparin  sq  Follow up hgb is 8,0 with plt 114 and wbc 6.7    Protein-calorie malnutrition, severe Continue nutritional supplements    Cocaine abuse (HCC) Counseled   UTI (urinary tract infection) Positive urine culture and positive pyuria on urine analysis Urine culture positive for enterococcus fecalis.  Sensitive to ampicillin, treated with antibiotic therapy with Augmentin  for 3 days   Discharge Exam: Vitals:   03/30/24 0823 03/30/24 1105  BP:  116/88  Pulse:  88  Resp:  15  Temp:    SpO2: 96% 100%   General exam: Appears calm and comfortable chronically ill and cachectic HEENT: No JVD Respiratory system: Clear to auscultation Cardiovascular system: S1 & S2 heard, RRR.  Abd: nondistended, soft and nontender.Normal bowel sounds heard. Central nervous system: Alert and oriented. No focal neurological deficits. Extremities: no edema Skin: No rashes Psychiatry:  Mood & affect appropriate.   Discharge Instructions    Allergies as of 04/01/2024   No Known Allergies      Medication  List     TAKE these medications    acetaminophen  325 MG tablet Commonly known as: TYLENOL  Take 2  tablets (650 mg total) by mouth every 6 (six) hours as needed for mild pain (pain score 1-3) or fever (or Fever >/= 101).   albuterol  108 (90 Base) MCG/ACT inhaler Commonly known as: VENTOLIN  HFA Inhale 2 puffs into the lungs every 6 (six) hours as needed for wheezing or shortness of breath.   atorvastatin  40 MG tablet Commonly known as: LIPITOR Take 1 tablet (40 mg total) by mouth every evening.   feeding supplement Liqd Take 237 mLs by mouth 3 (three) times daily between meals.   folic acid  1 MG tablet Commonly known as: FOLVITE  Take 1 tablet (1 mg total) by mouth daily.   melatonin 5 MG Tabs Take 1 tablet (5 mg total) by mouth at bedtime as needed (insomnia).   midodrine  5 MG tablet Commonly known as: PROAMATINE  Take 1 tablet (5 mg total) by mouth 3 (three) times daily with meals.   multivitamin with minerals Tabs tablet Take 1 tablet by mouth daily.   tamsulosin  0.4 MG Caps capsule Commonly known as: FLOMAX  Take 1 capsule (0.4 mg total) by mouth daily after breakfast.   thiamine  100 MG tablet Commonly known as: Vitamin B-1 Take 1 tablet (100 mg total) by mouth daily.   tiotropium 18 MCG inhalation capsule Commonly known as: SPIRIVA  Place 18 mcg into inhaler and inhale daily.   Torsemide  40 MG Tabs Take 40 mg by mouth daily. What changed:  medication strength when to take this reasons to take this   traZODone  50 MG tablet Commonly known as: DESYREL  Take 50 mg by mouth at bedtime.       No Known Allergies  Follow-up Information     [facility] Follow up.   Specialty: Cardiology Why: Hospital Follow Up 04/02/24 at 9:45 AM   PLEASE bring a current medication list to appointment FREE valet parking, Entrance C, Off [employer] [facility] entrance. Contact information: [address] [place]  [phone]                 The results of  significant diagnostics from this hospitalization (including imaging, microbiology, ancillary and laboratory) are listed below for reference.    Significant Diagnostic Studies: DG Chest Portable 1 View Result Date: 03/17/2024 CLINICAL DATA:  dyspnea. EXAM: PORTABLE CHEST 1 VIEW COMPARISON:  02/10/2024. FINDINGS: Mild-to-moderate diffuse pulmonary vascular congestion with basilar gradient. There are bilateral small to trace pleural effusions. Probable atelectatic changes at the lung bases. Bilateral lung fields are clear. No dense consolidation or lung collapse. No pneumothorax. Stable mildly enlarged cardio-mediastinal silhouette. No acute osseous abnormalities. The soft tissues are within normal limits. IMPRESSION: Findings favor congestive heart failure/pulmonary edema. Electronically Signed   By: Ree Molt M.D.   On: 03/17/2024 17:36    Microbiology: No results found for this or any previous visit (from the past 240 hours).   Labs: Basic Metabolic Panel: Recent Labs  Lab 03/24/24 0235 03/25/24 2145 03/27/24 0245 03/28/24 0301 03/30/24 0325  NA 132* 132* 135 133* 136  K 3.7 3.6 4.3 3.5 4.1  CL 92* 88* 91* 91* 92*  CO2 31 31 31 28 30   GLUCOSE 149* 126* 126* 112* 111*  BUN 64* 74* 79* 78* 88*  CREATININE 2.14* 2.08* 2.18* 2.17* 2.28*  CALCIUM  8.4* 8.4* 8.7* 8.8* 8.7*  MG  --  1.8 2.2 2.1 2.0  PHOS 1.0* 1.3*  --   --   --    Liver Function Tests: Recent Labs  Lab 03/24/24 0235 03/25/24 2145  ALBUMIN 2.4* 2.4*   No results for input(s): LIPASE, AMYLASE in the last 168 hours. No results for input(s): AMMONIA in the last 168 hours. CBC: No results for input(s): WBC, NEUTROABS, HGB, HCT, MCV, PLT in the last 168 hours. Cardiac Enzymes: No results for input(s): CKTOTAL, CKMB, CKMBINDEX, TROPONINI in the last 168 hours. BNP: BNP (last 3 results) Recent Labs    06/24/23 1511 02/10/24 2028 03/17/24 1613  BNP >4,500.0* >4,500.0* >4,500.0*     ProBNP (last 3 results) No results for input(s): PROBNP in the last 8760 hours.  CBG: Recent Labs  Lab 03/23/24 2113  GLUCAP 149*       Signed:  Sigurd Pac MD.  [facility] 03/30/2024, 11:07 AM

## 2024-03-30 NOTE — Plan of Care (Signed)

## 2024-03-30 NOTE — TOC Progression Note (Addendum)
 Transition of Care [facility]) - Progression Note    Patient Details  Name: Joseph Tanner MRN: [id number] Date of Birth: 01/01/1951  Transition of Care [facility]) CM/SW Contact  [name], [place] Phone Number: 03/30/2024, 2:15 PM  Clinical Narrative:   Per TOC leadership, Harlene Jes, patient cannot discharge until SNF insurance appeal decision has been rendered.   2:16 PM CSW called [employer] appeal department to inquire about status of appeal. Per [employer] representative, appeal overturned (case [id number]). Auth id [id number]; Approval dates 8/25 - 9/4.   2:39 PM Per [facility], we get an approval by fax... Haven't received anything as of yet. Facility will need approval information prior to patient discharging to them. CSW notified family and MD.   2:58 PM CSW spoke with patients dtr, Harlene. Harlene would like for CSW to arrange Non emergency EMS at discharge.   4:03 PM CSW called insurance to obtain copy of approval information. Per [facility] representative, the approval information is only sent to the SNF and [employer] cannot send it to CSW.   CSW will continue to follow.    Expected Discharge Plan: Skilled Nursing Facility Barriers to Discharge: Insurance Authorization               Expected Discharge Plan and Services In-house Referral: Clinical Social Work, Artist     Living arrangements for the past 2 months: Single Family Home Expected Discharge Date: 03/24/24                                     Social Drivers of Health (SDOH) Interventions SDOH Screenings   Food Insecurity: No Food Insecurity (03/18/2024)  Housing: Unknown (03/18/2024)  Transportation Needs: No Transportation Needs (03/18/2024)  Utilities: Not At Risk (03/18/2024)  Alcohol Screen: Low Risk  (06/17/2023)  Financial Resource Strain: Low Risk  (06/17/2023)  Social Connections: Moderately Isolated (03/18/2024)  Tobacco Use: High Risk (02/13/2024)    Readmission Risk  Interventions    02/11/2024   11:47 AM  Readmission Risk Prevention Plan  Transportation Screening Complete  PCP or Specialist Appt within 5-7 Days Complete  Home Care Screening Complete  Medication Review (RN CM) Complete

## 2024-03-31 ENCOUNTER — Other Ambulatory Visit: Payer: Self-pay

## 2024-03-31 ENCOUNTER — Encounter (HOSPITAL_COMMUNITY): Payer: Self-pay | Admitting: Internal Medicine

## 2024-03-31 DIAGNOSIS — I5023 Acute on chronic systolic (congestive) heart failure: Secondary | ICD-10-CM | POA: Diagnosis not present

## 2024-03-31 LAB — BASIC METABOLIC PANEL WITH GFR
Anion gap: 12 (ref 5–15)
BUN: 94 mg/dL — ABNORMAL HIGH (ref 8–23)
CO2: 29 mmol/L (ref 22–32)
Calcium: 8.5 mg/dL — ABNORMAL LOW (ref 8.9–10.3)
Chloride: 91 mmol/L — ABNORMAL LOW (ref 98–111)
Creatinine, Ser: 2.16 mg/dL — ABNORMAL HIGH (ref 0.61–1.24)
GFR, Estimated: 32 mL/min — ABNORMAL LOW (ref >60)
Glucose, Bld: 101 mg/dL — ABNORMAL HIGH (ref 70–99)
Potassium: 3.5 mmol/L (ref 3.5–5.1)
Sodium: 132 mmol/L — ABNORMAL LOW (ref 135–145)

## 2024-03-31 NOTE — TOC Progression Note (Addendum)
 Transition of Care [facility]) - Progression Note    Patient Details  Name: Joseph Tanner MRN: [id number] Date of Birth: November 01, 1950  Transition of Care [facility]) CM/SW Contact  [name], [place] Phone Number: 03/31/2024, 10:58AM  Clinical Narrative:    10:58 AM CSW spoke with [facility] appeal representative (ref # [id number]) to confirm appeal information has been sent to facility. Per [facility] rep, overturn information has been sent. CSW inquired if [employer] can fax over appeal information to CSW. [employer] rep stated yes and CSW provided department fax number. CSW awaiting fax at this time.   1:51 PM CSW has not received a fax at this time.   2:14 PM CSW called insurance about approval information sheet not being received at this time. CSW asked insurance to fax information directly to facility (fax number: [phone]). CSW asked representative to refax information to CSW at department fax number.   2:50PM CSW staffed case with [facility] supervisor, Harlene Jes. Per [facility] supervisor, there is no further action that can pursued by CSW. CSW has made multiple attempts at call insurance to get documentation faxed to [facility] and CSW. CSW has not received anything. Per leadership, if CSW/[facility] does not receives anything by tomorrow, CSW to reach back out to leadership.   CSW will continue to follow.    Expected Discharge Plan: Skilled Nursing Facility Barriers to Discharge: Insurance Authorization               Expected Discharge Plan and Services In-house Referral: Clinical Social Work, Artist     Living arrangements for the past 2 months: Single Family Home Expected Discharge Date: 03/24/24                                     Social Drivers of Health (SDOH) Interventions SDOH Screenings   Food Insecurity: No Food Insecurity (03/18/2024)  Housing: Unknown (03/18/2024)  Transportation Needs: No Transportation Needs (03/18/2024)  Utilities: Not At Risk (03/18/2024)   Alcohol Screen: Low Risk  (06/17/2023)  Financial Resource Strain: Low Risk  (06/17/2023)  Social Connections: Moderately Isolated (03/18/2024)  Tobacco Use: High Risk (02/13/2024)    Readmission Risk Interventions    02/11/2024   11:47 AM  Readmission Risk Prevention Plan  Transportation Screening Complete  PCP or Specialist Appt within 5-7 Days Complete  Home Care Screening Complete  Medication Review (RN CM) Complete

## 2024-03-31 NOTE — Care Management Important Message (Signed)
 Important Message  Patient Details  Name: Joseph Tanner MRN: [id number] Date of Birth: July 09, 1951   Important Message Given:  Yes - Medicare IM     [name] 03/31/2024, 11:52 AM

## 2024-03-31 NOTE — Progress Notes (Signed)
 OT Cancellation Note  Patient Details Name: Joseph Tanner MRN: [id number] DOB: Aug 16, 1950   Cancelled Treatment:    Reason Eval/Treat Not Completed: Patient declined, no reason specified. 1409: OT entered for tx session, pt in bed, refusing to remove covers over face, requesting to rest  1608: OT returned for second attempt at tx session, pt in recliner. Pt reporting frustration regarding d/c process and refusing tx session. OT educated pt on benefits and importance of continued mobilization. Pt declining all attempts at ADLs and mobility.   OT will continue to follow per POC.  Joseph Tanner C, OT  Acute Rehabilitation Services Office [phone] Secure chat preferred   Joseph Tanner 03/31/2024, 4:09 PM

## 2024-03-31 NOTE — Plan of Care (Signed)
  Problem: Education: Goal: Knowledge of General Education information will improve Description: Including pain rating scale, medication(s)/side effects and non-pharmacologic comfort measures Outcome: Progressing   Problem: Health Behavior/Discharge Planning: Goal: Ability to manage health-related needs will improve Outcome: Progressing   Problem: Clinical Measurements: Goal: Ability to maintain clinical measurements within normal limits will improve Outcome: Progressing Goal: Will remain free from infection Outcome: Progressing Goal: Diagnostic test results will improve Outcome: Progressing Goal: Respiratory complications will improve Outcome: Progressing Goal: Cardiovascular complication will be avoided Outcome: Progressing   Problem: Nutrition: Goal: Adequate nutrition will be maintained Outcome: Progressing   Problem: Coping: Goal: Level of anxiety will decrease Outcome: Progressing   Problem: Elimination: Goal: Will not experience complications related to bowel motility Outcome: Progressing Goal: Will not experience complications related to urinary retention Outcome: Progressing   Problem: Pain Managment: Goal: General experience of comfort will improve and/or be controlled Outcome: Progressing   Problem: Safety: Goal: Ability to remain free from injury will improve Outcome: Progressing   Problem: Skin Integrity: Goal: Risk for impaired skin integrity will decrease Outcome: Progressing   Problem: Education: Goal: Ability to demonstrate management of disease process will improve Outcome: Progressing Goal: Ability to verbalize understanding of medication therapies will improve Outcome: Progressing Goal: Individualized Educational Video(s) Outcome: Progressing   Problem: Activity: Goal: Capacity to carry out activities will improve Outcome: Progressing   Problem: Cardiac: Goal: Ability to achieve and maintain adequate cardiopulmonary perfusion will  improve Outcome: Progressing

## 2024-03-31 NOTE — Progress Notes (Signed)
 Patient is feeling well, no dyspnea or chest pain, no PND or orthopnea.  Continue very weak and deconditioned  BP 122/80 (BP Location: Left Arm)   Pulse 90   Temp 98 F (36.7 C) (Oral)   Resp 17   Ht 6' 3 (1.905 m)   Wt 60 kg   SpO2 100%   BMI 16.53 kg/m   Neurology awake and alert ENT with mild pallor with no icterus Cardiovascular with S1 and S2 present and regular with no gallops or rubs No JVD Respiratory with no rales or wheezing, no rhonchi  Abdomen with no distention  No lower extremity edema.  Acute systolic heart failure Clinically compensated, plan to transfer to SNF.

## 2024-03-31 NOTE — Progress Notes (Signed)
 Mobility Specialist Progress Note:    03/31/24 1005  Mobility  Activity Ambulated with assistance  Level of Assistance Standby assist, set-up cues, supervision of patient - no hands on  Assistive Device None  Distance Ambulated (ft) 25 ft  Activity Response Tolerated well  Mobility Referral Yes  Mobility visit 1 Mobility  Mobility Specialist Start Time (ACUTE ONLY) 1005  Mobility Specialist Stop Time (ACUTE ONLY) 1009  Mobility Specialist Time Calculation (min) (ACUTE ONLY) 4 min   Pt agreeable to session right after receiving d/c new from his doctor. No c/o any symptoms. Pt moving and ambulating well. Left pt ambulating in room w/ all needs met.   Venetia Keel Mobility Specialist Please Neurosurgeon or Rehab Office at [phone]

## 2024-04-01 ENCOUNTER — Telehealth (HOSPITAL_COMMUNITY): Payer: Self-pay

## 2024-04-01 DIAGNOSIS — J810 Acute pulmonary edema: Secondary | ICD-10-CM | POA: Diagnosis not present

## 2024-04-01 DIAGNOSIS — F17210 Nicotine dependence, cigarettes, uncomplicated: Secondary | ICD-10-CM | POA: Diagnosis not present

## 2024-04-01 DIAGNOSIS — Z7409 Other reduced mobility: Secondary | ICD-10-CM | POA: Diagnosis not present

## 2024-04-01 DIAGNOSIS — I5023 Acute on chronic systolic (congestive) heart failure: Secondary | ICD-10-CM | POA: Diagnosis not present

## 2024-04-01 DIAGNOSIS — Z66 Do not resuscitate: Secondary | ICD-10-CM | POA: Diagnosis not present

## 2024-04-01 DIAGNOSIS — Z7401 Bed confinement status: Secondary | ICD-10-CM | POA: Diagnosis not present

## 2024-04-01 DIAGNOSIS — I5022 Chronic systolic (congestive) heart failure: Secondary | ICD-10-CM | POA: Diagnosis not present

## 2024-04-01 DIAGNOSIS — Z79899 Other long term (current) drug therapy: Secondary | ICD-10-CM | POA: Diagnosis not present

## 2024-04-01 DIAGNOSIS — R0989 Other specified symptoms and signs involving the circulatory and respiratory systems: Secondary | ICD-10-CM | POA: Diagnosis not present

## 2024-04-01 DIAGNOSIS — I272 Pulmonary hypertension, unspecified: Secondary | ICD-10-CM | POA: Diagnosis not present

## 2024-04-01 DIAGNOSIS — R627 Adult failure to thrive: Secondary | ICD-10-CM | POA: Diagnosis not present

## 2024-04-01 DIAGNOSIS — Z7189 Other specified counseling: Secondary | ICD-10-CM | POA: Diagnosis not present

## 2024-04-01 DIAGNOSIS — J811 Chronic pulmonary edema: Secondary | ICD-10-CM | POA: Diagnosis not present

## 2024-04-01 DIAGNOSIS — R918 Other nonspecific abnormal finding of lung field: Secondary | ICD-10-CM | POA: Diagnosis not present

## 2024-04-01 DIAGNOSIS — E7800 Pure hypercholesterolemia, unspecified: Secondary | ICD-10-CM | POA: Diagnosis not present

## 2024-04-01 DIAGNOSIS — D61818 Other pancytopenia: Secondary | ICD-10-CM | POA: Diagnosis not present

## 2024-04-01 DIAGNOSIS — E871 Hypo-osmolality and hyponatremia: Secondary | ICD-10-CM | POA: Diagnosis not present

## 2024-04-01 DIAGNOSIS — E88A Wasting disease (syndrome) due to underlying condition: Secondary | ICD-10-CM | POA: Diagnosis not present

## 2024-04-01 DIAGNOSIS — E785 Hyperlipidemia, unspecified: Secondary | ICD-10-CM | POA: Diagnosis not present

## 2024-04-01 DIAGNOSIS — R069 Unspecified abnormalities of breathing: Secondary | ICD-10-CM | POA: Diagnosis not present

## 2024-04-01 DIAGNOSIS — I10 Essential (primary) hypertension: Secondary | ICD-10-CM | POA: Diagnosis not present

## 2024-04-01 DIAGNOSIS — D649 Anemia, unspecified: Secondary | ICD-10-CM | POA: Diagnosis not present

## 2024-04-01 DIAGNOSIS — R7889 Finding of other specified substances, not normally found in blood: Secondary | ICD-10-CM | POA: Diagnosis not present

## 2024-04-01 DIAGNOSIS — R531 Weakness: Secondary | ICD-10-CM | POA: Diagnosis present

## 2024-04-01 DIAGNOSIS — Z8659 Personal history of other mental and behavioral disorders: Secondary | ICD-10-CM | POA: Diagnosis not present

## 2024-04-01 DIAGNOSIS — Z87440 Personal history of urinary (tract) infections: Secondary | ICD-10-CM | POA: Diagnosis not present

## 2024-04-01 DIAGNOSIS — D631 Anemia in chronic kidney disease: Secondary | ICD-10-CM | POA: Diagnosis not present

## 2024-04-01 DIAGNOSIS — E8809 Other disorders of plasma-protein metabolism, not elsewhere classified: Secondary | ICD-10-CM | POA: Diagnosis not present

## 2024-04-01 DIAGNOSIS — K922 Gastrointestinal hemorrhage, unspecified: Secondary | ICD-10-CM | POA: Diagnosis not present

## 2024-04-01 DIAGNOSIS — I130 Hypertensive heart and chronic kidney disease with heart failure and stage 1 through stage 4 chronic kidney disease, or unspecified chronic kidney disease: Secondary | ICD-10-CM | POA: Diagnosis not present

## 2024-04-01 DIAGNOSIS — J449 Chronic obstructive pulmonary disease, unspecified: Secondary | ICD-10-CM | POA: Diagnosis not present

## 2024-04-01 DIAGNOSIS — F1411 Cocaine abuse, in remission: Secondary | ICD-10-CM | POA: Diagnosis not present

## 2024-04-01 DIAGNOSIS — F1090 Alcohol use, unspecified, uncomplicated: Secondary | ICD-10-CM | POA: Diagnosis not present

## 2024-04-01 DIAGNOSIS — I2510 Atherosclerotic heart disease of native coronary artery without angina pectoris: Secondary | ICD-10-CM | POA: Diagnosis not present

## 2024-04-01 DIAGNOSIS — I959 Hypotension, unspecified: Secondary | ICD-10-CM | POA: Diagnosis not present

## 2024-04-01 DIAGNOSIS — M6281 Muscle weakness (generalized): Secondary | ICD-10-CM | POA: Diagnosis not present

## 2024-04-01 DIAGNOSIS — I428 Other cardiomyopathies: Secondary | ICD-10-CM | POA: Diagnosis not present

## 2024-04-01 DIAGNOSIS — I5043 Acute on chronic combined systolic (congestive) and diastolic (congestive) heart failure: Secondary | ICD-10-CM | POA: Diagnosis not present

## 2024-04-01 DIAGNOSIS — E43 Unspecified severe protein-calorie malnutrition: Secondary | ICD-10-CM | POA: Diagnosis not present

## 2024-04-01 DIAGNOSIS — I5084 End stage heart failure: Secondary | ICD-10-CM | POA: Diagnosis present

## 2024-04-01 DIAGNOSIS — E519 Thiamine deficiency, unspecified: Secondary | ICD-10-CM | POA: Diagnosis not present

## 2024-04-01 DIAGNOSIS — R7989 Other specified abnormal findings of blood chemistry: Secondary | ICD-10-CM | POA: Diagnosis not present

## 2024-04-01 DIAGNOSIS — N184 Chronic kidney disease, stage 4 (severe): Secondary | ICD-10-CM | POA: Diagnosis not present

## 2024-04-01 LAB — BASIC METABOLIC PANEL WITH GFR
Anion gap: 13 (ref 5–15)
BUN: 93 mg/dL — ABNORMAL HIGH (ref 8–23)
CO2: 28 mmol/L (ref 22–32)
Calcium: 8.3 mg/dL — ABNORMAL LOW (ref 8.9–10.3)
Chloride: 94 mmol/L — ABNORMAL LOW (ref 98–111)
Creatinine, Ser: 2.07 mg/dL — ABNORMAL HIGH (ref 0.61–1.24)
GFR, Estimated: 33 mL/min — ABNORMAL LOW (ref >60)
Glucose, Bld: 140 mg/dL — ABNORMAL HIGH (ref 70–99)
Potassium: 3.4 mmol/L — ABNORMAL LOW (ref 3.5–5.1)
Sodium: 135 mmol/L (ref 135–145)

## 2024-04-01 MED ORDER — POTASSIUM CHLORIDE CRYS ER 20 MEQ PO TBCR
40.0000 meq | EXTENDED_RELEASE_TABLET | Freq: Once | ORAL | Status: AC
Start: 2024-04-01 — End: 2024-04-01
  Administered 2024-04-01: 40 meq via ORAL
  Filled 2024-04-01: qty 2

## 2024-04-01 NOTE — Progress Notes (Signed)
 AVS and DNR in packet for transport

## 2024-04-01 NOTE — TOC Transition Note (Addendum)
 Transition of Care [facility]) - Discharge Note   Patient Details  Name: Joseph Tanner MRN: [id number] Date of Birth: 1951/05/12  Transition of [facility]) CM/SW Contact:  [name], LCSWA Phone Number: 04/01/2024, 2:41 PM   Clinical Narrative:   Patient will DC to: [facility] Anticipated DC date: 04/01/24  Family notified: Harlene (dtr) Transport by: [facility]   Per MD patient ready for DC to [facility]. RN to call report prior to discharge [phone]; room 124A). RN, patient, patient's family, and facility notified of DC. Discharge Summary and FL2 sent to facility. DC packet on chart. Ambulance transport requested for patient 2:41 PM.   CSW will sign off for now as social work intervention is no longer needed. Please consult us  again if new needs arise.      Final next level of care: Skilled Nursing Facility Barriers to Discharge: Barriers Resolved   Patient Goals and CMS Choice Patient states their goals for this hospitalization and ongoing recovery are:: To go home          Discharge Placement PASRR number recieved: 03/19/24            Patient chooses bed at: [facility] Patient to be transferred to facility by: [facility] Name of family member notified: Harlene (dtr) Patient and family notified of of transfer: 04/01/24  Discharge Plan and Services Additional resources added to the After Visit Summary for   In-house Referral: Clinical Social Work, Artist                                   Social Drivers of Health (SDOH) Interventions SDOH Screenings   Food Insecurity: No Food Insecurity (03/18/2024)  Housing: Unknown (03/31/2024)  Transportation Needs: No Transportation Needs (03/18/2024)  Utilities: Not At Risk (03/18/2024)  Alcohol Screen: Low Risk  (06/17/2023)  Financial Resource Strain: Low Risk  (06/17/2023)  Social Connections: Moderately Isolated (03/18/2024)  Tobacco Use: High Risk (03/31/2024)      Readmission Risk Interventions    02/11/2024   11:47 AM  Readmission Risk Prevention Plan  Transportation Screening Complete  PCP or Specialist Appt within 5-7 Days Complete  Home Care Screening Complete  Medication Review (RN CM) Complete

## 2024-04-01 NOTE — Plan of Care (Signed)
   Problem: Education: Goal: Knowledge of General Education information will improve Description: Including pain rating scale, medication(s)/side effects and non-pharmacologic comfort measures Outcome: Progressing   Problem: Clinical Measurements: Goal: Ability to maintain clinical measurements within normal limits will improve Outcome: Progressing   Problem: Clinical Measurements: Goal: Respiratory complications will improve Outcome: Progressing

## 2024-04-01 NOTE — Progress Notes (Signed)
 Pt is medically stable for d/c. Spoke with Tammy, LPN at [facility] and gave her a full report.

## 2024-04-01 NOTE — Progress Notes (Signed)
 Patient is feeling well, no chest pain or dyspnea, continue very weak and deconditioned  BP 97/73 (BP Location: Right Arm)   Pulse 100   Temp 97.7 F (36.5 C) (Oral)   Resp 20   Ht 6' 3 (1.905 m)   Wt 59.6 kg   SpO2 100%   BMI 16.42 kg/m   Neurology awake and alert ENT with mild pallor with no icterus Cardiovascular with S1 and S2 present and regular Respiratory with no wheezing or rhonchi  Abdomen with no distention  No lower extremity edema  Congested heart failure exacerbation.   Plan to discharge to SNF today, per social services.

## 2024-04-01 NOTE — Progress Notes (Signed)
 Mobility Specialist Progress Note:    04/01/24 1446  Mobility  Activity Ambulated with assistance  Level of Assistance Contact guard assist, steadying assist  Assistive Device Other (Comment) (HHA)  Distance Ambulated (ft) 25 ft  Activity Response Tolerated well  Mobility Referral Yes  Mobility visit 1 Mobility  Mobility Specialist Start Time (ACUTE ONLY) 1355  Mobility Specialist Stop Time (ACUTE ONLY) 1407  Mobility Specialist Time Calculation (min) (ACUTE ONLY) 12 min   Pt received in BR requesting assistance. Pt needed no physical assistance throughout, just contact guard via HHA d/t unsteadiness. MS assisted in pericare. Was able to ambulate back to bed w/ call bell and personal belongings in reach. All needs met.  Thersia Minder Mobility Specialist  Please contact vis Secure Chat or  Rehab Office [phone]

## 2024-04-01 NOTE — TOC Progression Note (Addendum)
 Transition of Care [facility]) - Progression Note    Patient Details  Name: Joseph Tanner MRN: [id number] Date of Birth: 1951-02-25  Transition of Care [facility]) CM/SW Contact  [name], [place] Phone Number: 04/01/2024, 10:42 AM  Clinical Narrative:   CSW called (ref [phone]) [employer] to speak with a representative about appeal information not being received by [facility] or CSW. CSW informed insurance that this process has been continuously going on for several days and that patient is medically stable for discharge. CSW was informed by [employer] representative that faxes take 1-5 business days. CSW asked representative to transfer CSW to the expedited appeals department. CSW was transferred and informed appeal representative of 1-5 business day fax turnaround that CSW was informed of. Per Appeal representative, it does not take 1-5 business days for a fax to be sent. Per Appeal representative, the hold up is that the fax is awaiting approval to be sent. CSW asked if the approval can be expedited or if CSW can receive the information via email. Appeal representative stated no. Per Appeal representative, the appeal information can be seen in the [facility] portal. CSW inquired with [facility] if a portal was utilized to [employer] and if so can they see.   Appeal representative refaxed the letter to CSW at [phone].  11:07 AM Per [name] with [facility] admissions, facility calls their authorizations in and awaits a fax for approvals.   CSW will continue to follow.    Expected Discharge Plan: Skilled Nursing Facility Barriers to Discharge: Insurance Authorization  Expected Discharge Plan and Services In-house Referral: Clinical Social Work, Artist     Living arrangements for the past 2 months: Single Family Home Expected Discharge Date: 03/24/24                                     Social Drivers of Health (SDOH) Interventions SDOH Screenings    Food Insecurity: No Food Insecurity (03/18/2024)  Housing: Unknown (03/31/2024)  Transportation Needs: No Transportation Needs (03/18/2024)  Utilities: Not At Risk (03/18/2024)  Alcohol Screen: Low Risk  (06/17/2023)  Financial Resource Strain: Low Risk  (06/17/2023)  Social Connections: Moderately Isolated (03/18/2024)  Tobacco Use: High Risk (03/31/2024)    Readmission Risk Interventions    02/11/2024   11:47 AM  Readmission Risk Prevention Plan  Transportation Screening Complete  PCP or Specialist Appt within 5-7 Days Complete  Home Care Screening Complete  Medication Review (RN CM) Complete

## 2024-04-01 NOTE — Telephone Encounter (Signed)
 Called to confirm/remind patient of their appointment at the [facility] on 04/02/2024 9:45.   Appointment:   [] Confirmed  [x] Left mess   [] No answer/No voice mail  [] VM Full/unable to leave message  [] Phone not in service  Patient reminded to bring all medications and/or complete list.  Confirmed patient has transportation. Gave directions, instructed to utilize valet parking.

## 2024-04-02 ENCOUNTER — Ambulatory Visit (HOSPITAL_COMMUNITY)
Admit: 2024-04-02 | Discharge: 2024-04-02 | Disposition: A | Discharge status: Home / Self Care | Attending: Cardiology | Admitting: Cardiology

## 2024-04-02 ENCOUNTER — Encounter (HOSPITAL_COMMUNITY): Payer: Self-pay

## 2024-04-02 VITALS — BP 118/68 | HR 93 | Ht 75.0 in | Wt 134.0 lb

## 2024-04-02 DIAGNOSIS — Z79899 Other long term (current) drug therapy: Secondary | ICD-10-CM | POA: Diagnosis not present

## 2024-04-02 DIAGNOSIS — F1090 Alcohol use, unspecified, uncomplicated: Secondary | ICD-10-CM | POA: Diagnosis not present

## 2024-04-02 DIAGNOSIS — I5022 Chronic systolic (congestive) heart failure: Secondary | ICD-10-CM | POA: Insufficient documentation

## 2024-04-02 DIAGNOSIS — I5084 End stage heart failure: Secondary | ICD-10-CM | POA: Diagnosis not present

## 2024-04-02 DIAGNOSIS — E7800 Pure hypercholesterolemia, unspecified: Secondary | ICD-10-CM | POA: Insufficient documentation

## 2024-04-02 DIAGNOSIS — I2510 Atherosclerotic heart disease of native coronary artery without angina pectoris: Secondary | ICD-10-CM | POA: Insufficient documentation

## 2024-04-02 DIAGNOSIS — J449 Chronic obstructive pulmonary disease, unspecified: Secondary | ICD-10-CM | POA: Diagnosis not present

## 2024-04-02 DIAGNOSIS — I959 Hypotension, unspecified: Secondary | ICD-10-CM | POA: Insufficient documentation

## 2024-04-02 DIAGNOSIS — I5082 Biventricular heart failure: Secondary | ICD-10-CM | POA: Diagnosis not present

## 2024-04-02 DIAGNOSIS — D61818 Other pancytopenia: Secondary | ICD-10-CM | POA: Diagnosis not present

## 2024-04-02 DIAGNOSIS — F1411 Cocaine abuse, in remission: Secondary | ICD-10-CM | POA: Insufficient documentation

## 2024-04-02 DIAGNOSIS — Z66 Do not resuscitate: Secondary | ICD-10-CM | POA: Insufficient documentation

## 2024-04-02 DIAGNOSIS — I130 Hypertensive heart and chronic kidney disease with heart failure and stage 1 through stage 4 chronic kidney disease, or unspecified chronic kidney disease: Secondary | ICD-10-CM | POA: Insufficient documentation

## 2024-04-02 DIAGNOSIS — I5043 Acute on chronic combined systolic (congestive) and diastolic (congestive) heart failure: Secondary | ICD-10-CM | POA: Diagnosis not present

## 2024-04-02 DIAGNOSIS — N390 Urinary tract infection, site not specified: Secondary | ICD-10-CM | POA: Diagnosis not present

## 2024-04-02 DIAGNOSIS — E88A Wasting disease (syndrome) due to underlying condition: Secondary | ICD-10-CM | POA: Diagnosis not present

## 2024-04-02 DIAGNOSIS — F17210 Nicotine dependence, cigarettes, uncomplicated: Secondary | ICD-10-CM | POA: Insufficient documentation

## 2024-04-02 DIAGNOSIS — E43 Unspecified severe protein-calorie malnutrition: Secondary | ICD-10-CM | POA: Diagnosis not present

## 2024-04-02 DIAGNOSIS — I4729 Other ventricular tachycardia: Secondary | ICD-10-CM | POA: Diagnosis not present

## 2024-04-02 DIAGNOSIS — N184 Chronic kidney disease, stage 4 (severe): Secondary | ICD-10-CM | POA: Insufficient documentation

## 2024-04-02 DIAGNOSIS — F1410 Cocaine abuse, uncomplicated: Secondary | ICD-10-CM | POA: Diagnosis not present

## 2024-04-02 DIAGNOSIS — Z87440 Personal history of urinary (tract) infections: Secondary | ICD-10-CM | POA: Insufficient documentation

## 2024-04-02 DIAGNOSIS — I428 Other cardiomyopathies: Secondary | ICD-10-CM | POA: Diagnosis not present

## 2024-04-02 MED ORDER — TORSEMIDE 40 MG PO TABS: 60.0000 mg | ORAL_TABLET | Freq: Two times a day (BID) | ORAL | Status: DC

## 2024-04-02 NOTE — Progress Notes (Signed)
 [facility] Note   PCP: Pcp, No PCP-Cardiologist: Annabella Scarce, MD  [facility]: Dr. Zenaida   Chief Complaint: f/u for systolic heart failure  HPI:  73 y.o. male with history of HFrEF, CAD, COPD, HTN, HLD, hx cocaine abuse, tobacco use, recurrent UTI.    Diagnosed with HF in 2019 while living in [place] . In 03/22 he was admitted with decompensated HF.  Echo with LVEF < 25%. LHC with severe stenosis small Lcx and mild moderate stenosis RCA. EF felt out of proportion to degree of CAD. CAD treated medically.     EF has been in 25-30% range, most recently < 20% on echo 04/24. ICD had been offered in the past but he declined.   He was admitted for acute on chronic CHF 06/14/23 in the setting of poor med compliance and dietary indiscretion w/ sodium. Echo with EF < 15%, mildly reduced RV. He was also treated for UTI and AKI.  UDS + for cocaine. Seen in [facility] clinic for post hospital f/u and deemed not a candidate for advanced therapies. He was instructed to return back to gen cards for ongoing management but per chart review had failed to f/u.    Now recently readmitted 7/25 w/ recurrent a/c CHF in setting of ongoing cocaine use and poor med compliance. P/w low output HF symptoms, exertional fatigue/ decreased exercise tolerance, GI symptoms w/ n/v and cardiac cachexia. BNP > 45000, Initial Lactic acid 5.8 further increasing to > 9.0, admit SCr 2.95 (prior b/l 2.3) further increased to 3.28 today, AST 48, ALT nl, T bilil 5.8. UDS + for cocaine. Echo EF 20-25%, RV severely reduced, IVC dilated. AHF team was consulted. He was seen by Dr. Zenaida. He required inotropes to assist w/ diuresis. He was placed on DBA but not not felt to be a candidate for advanced therapies nor home inotropes. He was weaned off w/ recs for palliative/ hospice care but pt declined and was discharged to a SNF.   Since then, he has been readmitted multiple times, twice last month. He was managed by IM and diuresed  w/ IV Lasix . GDMT limited by hypotension. Pt did change his CODE status to DNR. He was discharged on 9/4 back to SNF and instructed to f/u in clinic.   He presents today for f/u. Just got out of the hospital yesterday. NYHA IIIb currently.   We discussed that he is end-stage. He says he has had time to process things and is now at peace w/ poor diagnosis. Willing to accept hospice support.    Past Medical History:  Diagnosis Date   COPD (chronic obstructive pulmonary disease) (HCC)    Hyperlipemia    Hypertension    NSTEMI (non-ST elevated myocardial infarction) (HCC)    Pure hypercholesterolemia 03/15/2021    Current Outpatient Medications  Medication Sig Dispense Refill   acetaminophen  (TYLENOL ) 325 MG tablet Take 2 tablets (650 mg total) by mouth every 6 (six) hours as needed for mild pain (pain score 1-3) or fever (or Fever >/= 101).     albuterol  (VENTOLIN  HFA) 108 (90 Base) MCG/ACT inhaler Inhale 2 puffs into the lungs every 6 (six) hours as needed for wheezing or shortness of breath.     atorvastatin  (LIPITOR) 40 MG tablet Take 1 tablet (40 mg total) by mouth every evening. 90 tablet 2   feeding supplement (ENSURE PLUS HIGH PROTEIN) LIQD Take 237 mLs by mouth 3 (three) times daily between meals.     folic acid  (FOLVITE ) 1  MG tablet Take 1 tablet (1 mg total) by mouth daily.     melatonin 5 MG TABS Take 1 tablet (5 mg total) by mouth at bedtime as needed (insomnia).     midodrine  (PROAMATINE ) 5 MG tablet Take 1 tablet (5 mg total) by mouth 3 (three) times daily with meals. 90 tablet 0   Multiple Vitamin (MULTIVITAMIN WITH MINERALS) TABS tablet Take 1 tablet by mouth daily.     tamsulosin  (FLOMAX ) 0.4 MG CAPS capsule Take 1 capsule (0.4 mg total) by mouth daily after breakfast. 90 capsule 0   thiamine  (VITAMIN B-1) 100 MG tablet Take 1 tablet (100 mg total) by mouth daily.     traZODone  (DESYREL ) 50 MG tablet Take 50 mg by mouth at bedtime.     TUBERCULIN PPD ID Inject 5 Units into  the skin once a week.     tiotropium (SPIRIVA ) 18 MCG inhalation capsule Place 18 mcg into inhaler and inhale daily. (Patient not taking: Reported on 04/02/2024)     Torsemide  40 MG TABS Take 60 mg by mouth in the morning and at bedtime.     No current facility-administered medications for this encounter.    No Known Allergies    Social History   Socioeconomic History   Marital status: Divorced    Spouse name: Not on file   Number of children: 5   Years of education: Not on file   Highest education level: Associate degree: academic program  Occupational History   Occupation: Retired  Tobacco Use   Smoking status: Some Days    Types: Cigarettes   Smokeless tobacco: Never  Vaping Use   Vaping status: Never Used  Substance and Sexual Activity   Alcohol use: Not Currently   Drug use: Yes    Types: Cocaine    Comment: 6 months   Sexual activity: Not on file  Other Topics Concern   Not on file  Social History Narrative   Not on file   Social Drivers of Health   Financial Resource Strain: Low Risk  (06/17/2023)   Overall Financial Resource Strain (CARDIA)    Difficulty of Paying Living Expenses: Not very hard  Food Insecurity: No Food Insecurity (03/18/2024)   Hunger Vital Sign    Worried About Running Out of Food in the Last Year: Never true    Ran Out of Food in the Last Year: Never true  Transportation Needs: No Transportation Needs (03/18/2024)   PRAPARE - Administrator, Civil Service (Medical): No    Lack of Transportation (Non-Medical): No  Physical Activity: Not on file  Stress: Not on file  Social Connections: Moderately Isolated (03/18/2024)   Social Connection and Isolation Panel    Frequency of Communication with Friends and Family: More than three times a week    Frequency of Social Gatherings with Friends and Family: More than three times a week    Attends Religious Services: More than 4 times per year    Active Member of [employer] or Organizations:  No    Attends Banker Meetings: Never    Marital Status: Never married  Intimate Partner Violence: Not At Risk (03/18/2024)   Humiliation, Afraid, Rape, and Kick questionnaire    Fear of Current or Ex-Partner: No    Emotionally Abused: No    Physically Abused: No    Sexually Abused: No      Family History  Problem Relation Age of Onset   Heart failure Mother  Heart failure Maternal Grandmother    Heart failure Maternal Grandfather    Heart failure Paternal Grandmother    Heart failure Paternal Grandfather     Vitals:   04/02/24 0948  BP: 118/68  Pulse: 93  SpO2: 91%  Weight: 60.8 kg (134 lb)  Height: 6' 3 (1.905 m)   Physical Exam  GENERAL: thin cachetic, chronically ill appearing, looks older than actual age  Lungs- clear  CARDIAC:  JVP elevated to jaw          Regular rhythm, tachy rate, 2/6 MR murmur, trace b/l LE ABDOMEN: Soft, non-tender, non-distended.   NEUROLOGIC: No obvious FND   ECG: not performed    ASSESSMENT & PLAN:  1. Stage D Chronic Systolic Heart Failure, NYHA IIIb-IV   - Longstanding. Diagnosed with HF in 2019 while living in [place]  - NICM. Has CAD but severity of CM out of proportion to degree of CAD - habitual cocaine and ETOH use likely contributing   - EF has been in 25-30% range, most recently < 20% on echo 04/24. Previously offered ICD but declined - now w/ numerous readmissions for NYHA IV symptoms and volume overload w/ CS requiring inotropic support to assist w/ palliative diuresis - Most recent echo 7/25 Echo w/ Bi-V failure, EF 20-25%, RV severely reduced  - He is end-stage and not a candidate for advanced therapies nor home inotropes given MSOF, poor compliance and substance use. Unable to tolerate GDMT due to hypotension requiring midodrine  and progressive renal failure d/t cardiorenal syndrome. We have recommended numerous times palliative/ hospice care. We discussed this again today. He says he has had time to process  things and is now at peace w/ poor diagnosis. Willing to accept hospice support. He is already DNR.  - place hospice referral  - increase Torsemide  to 60 mg bid to help w/ palliative decongestion     2.  CKD IV  - b/l SCr 2.5.  - cardiorenal from low output HF  - not on SGLT2i given h/o frequent UTIs    3. Polysubstance Use - h/o habitual cocaine use, quit now in SNF   No planned clinic f/u. Pt transitioning to hospice care.    Caffie Shed, PA-C 04/02/24

## 2024-04-02 NOTE — Patient Instructions (Signed)
 Thank you for your visit today.  INCREASE Torsemide  to 60 mg Twice daily  If you have any questions, issues, or concerns before your next appointment please call our office at [phone], opt. 2 and leave a message for the triage nurse.

## 2024-04-03 DIAGNOSIS — N189 Chronic kidney disease, unspecified: Secondary | ICD-10-CM | POA: Diagnosis not present

## 2024-04-03 DIAGNOSIS — R531 Weakness: Secondary | ICD-10-CM | POA: Diagnosis not present

## 2024-04-03 DIAGNOSIS — J449 Chronic obstructive pulmonary disease, unspecified: Secondary | ICD-10-CM | POA: Diagnosis not present

## 2024-04-03 DIAGNOSIS — R5381 Other malaise: Secondary | ICD-10-CM | POA: Diagnosis not present

## 2024-04-03 DIAGNOSIS — I5042 Chronic combined systolic (congestive) and diastolic (congestive) heart failure: Secondary | ICD-10-CM | POA: Diagnosis not present

## 2024-04-03 DIAGNOSIS — Z7409 Other reduced mobility: Secondary | ICD-10-CM | POA: Diagnosis not present

## 2024-04-04 DIAGNOSIS — N184 Chronic kidney disease, stage 4 (severe): Secondary | ICD-10-CM | POA: Diagnosis not present

## 2024-04-04 DIAGNOSIS — Z7409 Other reduced mobility: Secondary | ICD-10-CM | POA: Diagnosis not present

## 2024-04-04 DIAGNOSIS — I5042 Chronic combined systolic (congestive) and diastolic (congestive) heart failure: Secondary | ICD-10-CM | POA: Diagnosis not present

## 2024-04-04 DIAGNOSIS — E43 Unspecified severe protein-calorie malnutrition: Secondary | ICD-10-CM | POA: Diagnosis not present

## 2024-04-04 DIAGNOSIS — J449 Chronic obstructive pulmonary disease, unspecified: Secondary | ICD-10-CM | POA: Diagnosis not present

## 2024-04-04 DIAGNOSIS — R531 Weakness: Secondary | ICD-10-CM | POA: Diagnosis not present

## 2024-04-04 DIAGNOSIS — D649 Anemia, unspecified: Secondary | ICD-10-CM | POA: Diagnosis not present

## 2024-04-05 DIAGNOSIS — R627 Adult failure to thrive: Secondary | ICD-10-CM | POA: Diagnosis not present

## 2024-04-05 DIAGNOSIS — Z7409 Other reduced mobility: Secondary | ICD-10-CM | POA: Diagnosis not present

## 2024-04-05 DIAGNOSIS — E43 Unspecified severe protein-calorie malnutrition: Secondary | ICD-10-CM | POA: Diagnosis not present

## 2024-04-05 DIAGNOSIS — R531 Weakness: Secondary | ICD-10-CM | POA: Diagnosis not present

## 2024-04-06 ENCOUNTER — Emergency Department (HOSPITAL_COMMUNITY)

## 2024-04-06 ENCOUNTER — Observation Stay (HOSPITAL_COMMUNITY)
Admission: EM | Admit: 2024-04-06 | Discharge: 2024-04-10 | Disposition: A | Discharge status: Skilled Nursing Facility | Attending: Internal Medicine | Admitting: Internal Medicine

## 2024-04-06 ENCOUNTER — Other Ambulatory Visit: Payer: Self-pay

## 2024-04-06 DIAGNOSIS — I959 Hypotension, unspecified: Secondary | ICD-10-CM | POA: Diagnosis not present

## 2024-04-06 DIAGNOSIS — I272 Pulmonary hypertension, unspecified: Secondary | ICD-10-CM | POA: Diagnosis not present

## 2024-04-06 DIAGNOSIS — K922 Gastrointestinal hemorrhage, unspecified: Principal | ICD-10-CM | POA: Insufficient documentation

## 2024-04-06 DIAGNOSIS — R531 Weakness: Secondary | ICD-10-CM | POA: Diagnosis not present

## 2024-04-06 DIAGNOSIS — J449 Chronic obstructive pulmonary disease, unspecified: Secondary | ICD-10-CM | POA: Insufficient documentation

## 2024-04-06 DIAGNOSIS — R7989 Other specified abnormal findings of blood chemistry: Secondary | ICD-10-CM | POA: Insufficient documentation

## 2024-04-06 DIAGNOSIS — I2510 Atherosclerotic heart disease of native coronary artery without angina pectoris: Secondary | ICD-10-CM | POA: Diagnosis not present

## 2024-04-06 DIAGNOSIS — R069 Unspecified abnormalities of breathing: Secondary | ICD-10-CM | POA: Diagnosis not present

## 2024-04-06 DIAGNOSIS — F17210 Nicotine dependence, cigarettes, uncomplicated: Secondary | ICD-10-CM | POA: Diagnosis not present

## 2024-04-06 DIAGNOSIS — I130 Hypertensive heart and chronic kidney disease with heart failure and stage 1 through stage 4 chronic kidney disease, or unspecified chronic kidney disease: Secondary | ICD-10-CM | POA: Diagnosis not present

## 2024-04-06 DIAGNOSIS — R918 Other nonspecific abnormal finding of lung field: Secondary | ICD-10-CM | POA: Diagnosis not present

## 2024-04-06 DIAGNOSIS — Z79899 Other long term (current) drug therapy: Secondary | ICD-10-CM | POA: Diagnosis not present

## 2024-04-06 DIAGNOSIS — I5023 Acute on chronic systolic (congestive) heart failure: Secondary | ICD-10-CM | POA: Insufficient documentation

## 2024-04-06 DIAGNOSIS — J811 Chronic pulmonary edema: Secondary | ICD-10-CM | POA: Diagnosis not present

## 2024-04-06 DIAGNOSIS — Z7409 Other reduced mobility: Secondary | ICD-10-CM | POA: Diagnosis not present

## 2024-04-06 DIAGNOSIS — E785 Hyperlipidemia, unspecified: Secondary | ICD-10-CM | POA: Insufficient documentation

## 2024-04-06 DIAGNOSIS — I5042 Chronic combined systolic (congestive) and diastolic (congestive) heart failure: Secondary | ICD-10-CM | POA: Diagnosis not present

## 2024-04-06 DIAGNOSIS — N184 Chronic kidney disease, stage 4 (severe): Secondary | ICD-10-CM | POA: Insufficient documentation

## 2024-04-06 DIAGNOSIS — R000 Tachycardia, unspecified: Secondary | ICD-10-CM | POA: Diagnosis not present

## 2024-04-06 DIAGNOSIS — Z8659 Personal history of other mental and behavioral disorders: Secondary | ICD-10-CM | POA: Diagnosis not present

## 2024-04-06 DIAGNOSIS — R0989 Other specified symptoms and signs involving the circulatory and respiratory systems: Secondary | ICD-10-CM | POA: Diagnosis not present

## 2024-04-06 DIAGNOSIS — D649 Anemia, unspecified: Principal | ICD-10-CM

## 2024-04-06 DIAGNOSIS — I10 Essential (primary) hypertension: Secondary | ICD-10-CM | POA: Diagnosis not present

## 2024-04-06 DIAGNOSIS — E43 Unspecified severe protein-calorie malnutrition: Secondary | ICD-10-CM | POA: Diagnosis not present

## 2024-04-06 DIAGNOSIS — E871 Hypo-osmolality and hyponatremia: Secondary | ICD-10-CM | POA: Diagnosis not present

## 2024-04-06 DIAGNOSIS — Z7189 Other specified counseling: Secondary | ICD-10-CM | POA: Insufficient documentation

## 2024-04-06 DIAGNOSIS — R7889 Finding of other specified substances, not normally found in blood: Secondary | ICD-10-CM | POA: Diagnosis not present

## 2024-04-06 DIAGNOSIS — D631 Anemia in chronic kidney disease: Secondary | ICD-10-CM | POA: Diagnosis not present

## 2024-04-06 DIAGNOSIS — R5381 Other malaise: Secondary | ICD-10-CM | POA: Diagnosis not present

## 2024-04-06 DIAGNOSIS — E519 Thiamine deficiency, unspecified: Secondary | ICD-10-CM | POA: Diagnosis not present

## 2024-04-06 LAB — BASIC METABOLIC PANEL WITH GFR
Anion gap: 15 (ref 5–15)
BUN: 75 mg/dL — ABNORMAL HIGH (ref 8–23)
CO2: 25 mmol/L (ref 22–32)
Calcium: 9 mg/dL (ref 8.9–10.3)
Chloride: 98 mmol/L (ref 98–111)
Creatinine, Ser: 2.66 mg/dL — ABNORMAL HIGH (ref 0.61–1.24)
GFR, Estimated: 25 mL/min — ABNORMAL LOW (ref >60)
Glucose, Bld: 104 mg/dL — ABNORMAL HIGH (ref 70–99)
Potassium: 3.6 mmol/L (ref 3.5–5.1)
Sodium: 138 mmol/L (ref 135–145)

## 2024-04-06 LAB — CBC WITH DIFFERENTIAL/PLATELET
Abs Immature Granulocytes: 0.01 K/uL (ref 0.00–0.07)
Basophils Absolute: 0 K/uL (ref 0.0–0.1)
Basophils Relative: 1 %
Eosinophils Absolute: 0 K/uL (ref 0.0–0.5)
Eosinophils Relative: 1 %
HCT: 25.4 % — ABNORMAL LOW (ref 39.0–52.0)
Hemoglobin: 7.4 g/dL — ABNORMAL LOW (ref 13.0–17.0)
Immature Granulocytes: 0 %
Lymphocytes Relative: 20 %
Lymphs Abs: 0.8 K/uL (ref 0.7–4.0)
MCH: 22.7 pg — ABNORMAL LOW (ref 26.0–34.0)
MCHC: 29.1 g/dL — ABNORMAL LOW (ref 30.0–36.0)
MCV: 77.9 fL — ABNORMAL LOW (ref 80.0–100.0)
Monocytes Absolute: 0.7 K/uL (ref 0.1–1.0)
Monocytes Relative: 15 %
Neutro Abs: 2.7 K/uL (ref 1.7–7.7)
Neutrophils Relative %: 63 %
Platelets: 203 K/uL (ref 150–400)
RBC: 3.26 MIL/uL — ABNORMAL LOW (ref 4.22–5.81)
RDW: 24.2 % — ABNORMAL HIGH (ref 11.5–15.5)
Smear Review: NORMAL
WBC: 4.3 K/uL (ref 4.0–10.5)
nRBC: 0 % (ref 0.0–0.2)

## 2024-04-06 LAB — HEPATIC FUNCTION PANEL
ALT: 25 U/L (ref 0–44)
AST: 58 U/L — ABNORMAL HIGH (ref 15–41)
Albumin: 2.8 g/dL — ABNORMAL LOW (ref 3.5–5.0)
Alkaline Phosphatase: 50 U/L (ref 38–126)
Bilirubin, Direct: 0.9 mg/dL — ABNORMAL HIGH (ref 0.0–0.2)
Indirect Bilirubin: 1 mg/dL — ABNORMAL HIGH (ref 0.3–0.9)
Total Bilirubin: 1.9 mg/dL — ABNORMAL HIGH (ref 0.0–1.2)
Total Protein: 7.6 g/dL (ref 6.5–8.1)

## 2024-04-06 LAB — POC OCCULT BLOOD, ED: Fecal Occult Bld: POSITIVE — AB

## 2024-04-06 LAB — PREPARE RBC (CROSSMATCH)

## 2024-04-06 LAB — TROPONIN I (HIGH SENSITIVITY): Troponin I (High Sensitivity): 75 ng/L — ABNORMAL HIGH (ref <18)

## 2024-04-06 LAB — MAGNESIUM: Magnesium: 2.2 mg/dL (ref 1.7–2.4)

## 2024-04-06 LAB — BRAIN NATRIURETIC PEPTIDE: B Natriuretic Peptide: 3125.8 pg/mL — ABNORMAL HIGH (ref 0.0–100.0)

## 2024-04-06 MED ORDER — FUROSEMIDE 10 MG/ML IJ SOLN
80.0000 mg | Freq: Once | INTRAMUSCULAR | Status: AC
  Administered 2024-04-06: 80 mg via INTRAVENOUS
  Filled 2024-04-06: qty 8

## 2024-04-06 MED ORDER — POTASSIUM CHLORIDE CRYS ER 20 MEQ PO TBCR
40.0000 meq | EXTENDED_RELEASE_TABLET | Freq: Once | ORAL | Status: AC
  Administered 2024-04-06: 40 meq via ORAL
  Filled 2024-04-06: qty 2

## 2024-04-06 MED ORDER — FENTANYL CITRATE PF 50 MCG/ML IJ SOSY
50.0000 ug | PREFILLED_SYRINGE | INTRAMUSCULAR | Status: DC | PRN
Start: 2024-04-06 — End: 2024-04-07

## 2024-04-06 MED ORDER — PANTOPRAZOLE SODIUM 40 MG IV SOLR
40.0000 mg | Freq: Two times a day (BID) | INTRAVENOUS | Status: DC
  Administered 2024-04-07 – 2024-04-09 (×6): 40 mg via INTRAVENOUS
  Filled 2024-04-06 (×6): qty 10

## 2024-04-06 MED ORDER — FENTANYL CITRATE PF 50 MCG/ML IJ SOSY
50.0000 ug | PREFILLED_SYRINGE | Freq: Once | INTRAMUSCULAR | Status: AC
  Administered 2024-04-06: 50 ug via INTRAVENOUS
  Filled 2024-04-06: qty 1

## 2024-04-06 MED ORDER — SODIUM CHLORIDE 0.9% IV SOLUTION: Freq: Once | INTRAVENOUS | Status: AC

## 2024-04-06 NOTE — ED Provider Notes (Signed)
 [place] EMERGENCY DEPARTMENT AT [facility] Provider Note   CSN: [id number] Arrival date & time: 04/06/24  1616     Patient presents with: Weakness   Joseph Tanner is a 73 y.o. male.   HPI Patient presents for abnormal lab result.  Medical history includes HTN, HLD, CAD, COPD, CKD, CHF.  He was admitted 3 weeks ago for CHF exacerbation.  He was diuresed 8.4 L.  He is on torsemide  and midodrine  at home.  He was discharged a week ago after a 2-week stay in the hospital.  Baseline hemoglobin is 8.  He did do a blood transfusion 1 month ago.  He followed up with HF clinic 4 days ago.  Plan was for hospice referral.  He currently resides in skilled nursing facility.  Nursing facility reportedly checked his hemoglobin today and found it to be low in the range of 6.6.  He was sent to the ED for further evaluation.  Patient states that he has chronic fatigue and diffuse pain.  He endorses ongoing shortness of breath.  He denies any known source of blood loss.    Prior to Admission medications   Medication Sig Start Date End Date Taking? Authorizing Provider  acetaminophen  (TYLENOL ) 325 MG tablet Take 2 tablets (650 mg total) by mouth every 6 (six) hours as needed for mild pain (pain score 1-3) or fever (or Fever >/= 101). 02/20/24   Gonfa, Mignon DASEN, MD  albuterol  (VENTOLIN  HFA) 108 (90 Base) MCG/ACT inhaler Inhale 2 puffs into the lungs every 6 (six) hours as needed for wheezing or shortness of breath.    [provider]  atorvastatin  (LIPITOR) 40 MG tablet Take 1 tablet (40 mg total) by mouth every evening. 01/29/23   Lucien Orren SAILOR, PA-C  feeding supplement (ENSURE PLUS HIGH PROTEIN) LIQD Take 237 mLs by mouth 3 (three) times daily between meals. 02/20/24   Gonfa, Taye T, MD  folic acid  (FOLVITE ) 1 MG tablet Take 1 tablet (1 mg total) by mouth daily. 02/21/24   Gonfa, Taye T, MD  melatonin 5 MG TABS Take 1 tablet (5 mg total) by mouth at bedtime as needed (insomnia).  02/20/24   Gonfa, Taye T, MD  midodrine  (PROAMATINE ) 5 MG tablet Take 1 tablet (5 mg total) by mouth 3 (three) times daily with meals. 03/30/24   Fairy Frames, MD  Multiple Vitamin (MULTIVITAMIN WITH MINERALS) TABS tablet Take 1 tablet by mouth daily. 11/25/20   Jadine Toribio SQUIBB, MD  tamsulosin  (FLOMAX ) 0.4 MG CAPS capsule Take 1 capsule (0.4 mg total) by mouth daily after breakfast. 02/17/24   Trixie Nilda HERO, MD  thiamine  (VITAMIN B-1) 100 MG tablet Take 1 tablet (100 mg total) by mouth daily. 02/21/24   Gonfa, Taye T, MD  tiotropium (SPIRIVA ) 18 MCG inhalation capsule Place 18 mcg into inhaler and inhale daily. Patient not taking: Reported on 04/02/2024    [provider]  Torsemide  40 MG TABS Take 60 mg by mouth in the morning and at bedtime. 04/02/24   Marcine Caffie HERO, PA-C  traZODone  (DESYREL ) 50 MG tablet Take 50 mg by mouth at bedtime.    [provider]  TUBERCULIN PPD ID Inject 5 Units into the skin once a week.    [provider]    Allergies: Patient has no known allergies.    Review of Systems  Constitutional:  Positive for fatigue.  Respiratory:  Positive for shortness of breath.   Musculoskeletal:  Positive for arthralgias (Chronic)  and myalgias (Chronic).  All other systems reviewed and are negative.   Updated Vital Signs BP 112/82   Pulse 97   Temp 98 F (36.7 C) (Oral)   Resp 16   Wt 77.1 kg   SpO2 100%   BMI 21.25 kg/m   Physical Exam Vitals and nursing note reviewed.  Constitutional:      General: He is not in acute distress.    Appearance: Normal appearance. He is well-developed. He is not ill-appearing, toxic-appearing or diaphoretic.  HENT:     Head: Normocephalic and atraumatic.     Right Ear: External ear normal.     Left Ear: External ear normal.     Nose: Nose normal.  Eyes:     Extraocular Movements: Extraocular movements intact.     Conjunctiva/sclera: Conjunctivae normal.  Cardiovascular:     Rate and Rhythm:  Normal rate and regular rhythm.     Heart sounds: No murmur heard. Pulmonary:     Effort: Pulmonary effort is normal. No respiratory distress.     Breath sounds: Normal breath sounds. No wheezing or rales.  Abdominal:     General: There is no distension.     Palpations: Abdomen is soft.     Tenderness: There is no abdominal tenderness.  Musculoskeletal:        General: No swelling. Normal range of motion.     Cervical back: Normal range of motion and neck supple.     Right lower leg: No edema.     Left lower leg: No edema.  Skin:    General: Skin is warm and dry.     Coloration: Skin is not jaundiced or pale.  Neurological:     General: No focal deficit present.     Mental Status: He is alert and oriented to person, place, and time.  Psychiatric:        Mood and Affect: Mood normal.        Behavior: Behavior normal.     (all labs ordered are listed, but only abnormal results are displayed) Labs Reviewed  CBC WITH DIFFERENTIAL/PLATELET - Abnormal; Notable for the following components:      Result Value   RBC 3.26 (*)    Hemoglobin 7.4 (*)    HCT 25.4 (*)    MCV 77.9 (*)    MCH 22.7 (*)    MCHC 29.1 (*)    RDW 24.2 (*)    All other components within normal limits  BASIC METABOLIC PANEL WITH GFR - Abnormal; Notable for the following components:   Glucose, Bld 104 (*)    BUN 75 (*)    Creatinine, Ser 2.66 (*)    GFR, Estimated 25 (*)    All other components within normal limits  BRAIN NATRIURETIC PEPTIDE - Abnormal; Notable for the following components:   B Natriuretic Peptide 3,125.8 (*)    All other components within normal limits  HEPATIC FUNCTION PANEL - Abnormal; Notable for the following components:   Albumin 2.8 (*)    AST 58 (*)    Total Bilirubin 1.9 (*)    Bilirubin, Direct 0.9 (*)    Indirect Bilirubin 1.0 (*)    All other components within normal limits  POC OCCULT BLOOD, ED - Abnormal; Notable for the following components:   Fecal Occult Bld POSITIVE  (*)    All other components within normal limits  TROPONIN I (HIGH SENSITIVITY) - Abnormal; Notable for the following components:   Troponin I (High Sensitivity) 75 (*)  All other components within normal limits  MAGNESIUM   LIPASE, BLOOD  TYPE AND SCREEN  PREPARE RBC (CROSSMATCH)  TROPONIN I (HIGH SENSITIVITY)    EKG: EKG Interpretation Date/Time:  Tuesday April 06 2024 19:30:11 EDT Ventricular Rate:  98 PR Interval:  186 QRS Duration:  118 QT Interval:  418 QTC Calculation: 534 R Axis:   -4  Text Interpretation: Sinus rhythm Probable left atrial enlargement LVH with secondary repolarization abnormality Prolonged QT interval Confirmed by Melvenia Motto [phone]) on 04/06/2024 7:31:55 PM  Radiology: [facility] Chest Portable 1 View Result Date: 04/06/2024 CLINICAL DATA:  Shortness of breath. EXAM: PORTABLE CHEST 1 VIEW COMPARISON:  Chest radiograph dated 03/17/2024 FINDINGS: Evaluation is limited due to positioning and exclusion of portion of the left lung from the image. There is mild cardiomegaly with mild vascular congestion and edema. Pneumonia is not excluded. No pleural effusion or pneumothorax identified. Bilateral hilar prominence suggestive of pulmonary hypertension. No acute osseous pathology. IMPRESSION: 1. Cardiomegaly with vascular congestion and mild edema. Pneumonia is not excluded. 2. Pulmonary hypertension. Electronically Signed   By: Vanetta Chou M.D.   On: 04/06/2024 20:11     Procedures   Medications Ordered in the ED  fentaNYL  (SUBLIMAZE ) injection 50 mcg (has no administration in time range)  pantoprazole  (PROTONIX ) injection 40 mg (has no administration in time range)  potassium chloride  SA (KLOR-CON  M) CR tablet 40 mEq (40 mEq Oral Given 04/06/24 1949)  furosemide  (LASIX ) injection 80 mg (80 mg Intravenous Given 04/06/24 1949)  0.9 %  sodium chloride  infusion (Manually program via Guardrails IV Fluids) ( Intravenous New Bag/Given 04/06/24 2203)  fentaNYL  (SUBLIMAZE )  injection 50 mcg (50 mcg Intravenous Given 04/06/24 1958)                                    Medical Decision Making Amount and/or Complexity of Data Reviewed Labs: ordered. Radiology: ordered.  Risk Prescription drug management. Decision regarding hospitalization.   This patient presents to the ED for concern of low hemoglobin, this involves an extensive number of treatment options, and is a complaint that carries with it a high risk of complications and morbidity.  The differential diagnosis includes blood loss anemia, chronic anemia, hemodilution   Co morbidities / Chronic conditions that complicate the patient evaluation  HTN, HLD, CAD, COPD, CKD, CHF   Additional history obtained:  Additional history obtained from EMR External records from outside source obtained and reviewed including N/A   Lab Tests:  I Ordered, and personally interpreted labs.  The pertinent results include: Decrease in hemoglobin from baseline; chronic elevations in troponin and BNP, creatinine slightly increased from baseline   Imaging Studies ordered:  I ordered imaging studies including chest x-ray I independently visualized and interpreted imaging which showed cardiomegaly with vascular congestion and mild edema I agree with the radiologist interpretation   Cardiac Monitoring: / EKG:  The patient was maintained on a cardiac monitor.  I personally viewed and interpreted the cardiac monitored which showed an underlying rhythm of: Sinus rhythm   Problem List / ED Course / Critical interventions / Medication management  Patient presenting for concern of low hemoglobin.  Per chart review, his baseline hemoglobin is in the range of 8.  This is most recently checked 2 weeks ago while in hospital.  Patient denies any recent blood loss.  At his facility, he reportedly had a hemoglobin of 6.6 today.  On exam, he  is well-appearing.  Current breathing is unlabored.  He is able to speak in complete  sentences.  Vital signs are reassuring.  Will check lab work to confirm low hemoglobin.  Patient's hemoglobin in the ED is 7.4.  This is below threshold to transfuse.  Patient was consented for blood transfusion.  1 unit PRBCs was ordered.  Dose of Lasix  ordered as well to minimize volume overload.  Prior to transfusion, while in the ED, patient had shortness of breath and chest tightness.  EKG does not show STEMI.  Additional lab work was ordered.  On assessment, patient is groaning in pain.  Fentanyl  was ordered.  Patient additional lab work showed chronic elevations in BNP and troponin.  DRE was performed with nurse chaperone present.  Stool was brown but was Hemoccult positive.  Protonix  was ordered.  I spoke with gastroenterologist on-call, Dr. Avram, who states that GI team will see in the morning.  Patient's chest discomfort and shortness of breath improved.  Transfusion is still pending.  Patient was admitted for further management. I ordered medication including PRBCs for symptomatic anemia; Lasix  for diuresis; fentanyl  for chest pain; Protonix  for empiric treatment of UGIB Reevaluation of the patient after these medicines showed that the patient improved I have reviewed the patients home medicines and have made adjustments as needed   Consultations Obtained:  I requested consultation with the gastroenterologist, Dr. Avram,  and discussed lab and imaging findings as well as pertinent plan - they recommend: GI team will see in the morning   Social Determinants of Health:  Resides in nursing facility, recently referred to hospice  CRITICAL CARE Performed by: Bernardino Fireman   Total critical care time: 31 minutes  Critical care time was exclusive of separately billable procedures and treating other patients.  Critical care was necessary to treat or prevent imminent or life-threatening deterioration.  Critical care was time spent personally by me on the following activities: development  of treatment plan with patient and/or surrogate as well as nursing, discussions with consultants, evaluation of patient's response to treatment, examination of patient, obtaining history from patient or surrogate, ordering and performing treatments and interventions, ordering and review of laboratory studies, ordering and review of radiographic studies, pulse oximetry and re-evaluation of patient's condition.      Final diagnoses:  Symptomatic anemia  Gastrointestinal hemorrhage, unspecified gastrointestinal hemorrhage type    ED Discharge Orders     None          Fireman Bernardino, MD 04/06/24 2253

## 2024-04-06 NOTE — H&P (Signed)
 History and Physical    Joseph Tanner FMW:[id number] DOB: January 22, 1951 DOA: 04/06/2024  Patient coming from: Skilled nursing facility.  Chief Complaint: Low hemoglobin.  HPI: Joseph Tanner is a 73 y.o. male with history of chronic HFrEF, chronic kidney disease stage IV, BPH, hyperlipidemia, CAD was referred to the ER after patient's hemoglobin at the facility was found to be around 6.  Patient is a poor historian.  Denies any chest pain or shortness of breath.  Patient recently was admitted for CHF exacerbation had followed up with heart failure clinic 4 days ago and plan was to referral to hospice.  Patient states he has not noticed any blood in the stools.  ED Course: In the ER patient's hemoglobin is around 7.4 decreased from 8.7 from last month.  Stool for occult blood was positive.  ER physician discussed with on-call gastroenterologist for the bowel Dr. Avram will see patient in consult.  Started on Protonix  IV 1 unit of PRBC transfusion ordered after 80 mg IV Lasix  given.  At the time of my exam patient is not in distress.  Review of Systems: As per HPI, rest all negative.   Past Medical History:  Diagnosis Date   COPD (chronic obstructive pulmonary disease) (HCC)    Hyperlipemia    Hypertension    NSTEMI (non-ST elevated myocardial infarction) (HCC)    Pure hypercholesterolemia 03/15/2021    No past surgical history on file.   reports that he has been smoking cigarettes. He has never used smokeless tobacco. He reports that he does not currently use alcohol. He reports current drug use. Drug: Cocaine.  No Known Allergies  Family History  Problem Relation Age of Onset   Heart failure Mother    Heart failure Maternal Grandmother    Heart failure Maternal Grandfather    Heart failure Paternal Grandmother    Heart failure Paternal Grandfather     Prior to Admission medications   Medication Sig Start Date End Date Taking? Authorizing Provider   acetaminophen  (TYLENOL ) 325 MG tablet Take 2 tablets (650 mg total) by mouth every 6 (six) hours as needed for mild pain (pain score 1-3) or fever (or Fever >/= 101). 02/20/24   Kathrin Mignon DASEN, MD  albuterol  (VENTOLIN  HFA) 108 (90 Base) MCG/ACT inhaler Inhale 2 puffs into the lungs every 6 (six) hours as needed for wheezing or shortness of breath.    [provider]  atorvastatin  (LIPITOR) 40 MG tablet Take 1 tablet (40 mg total) by mouth every evening. 01/29/23   Lucien Orren SAILOR, PA-C  feeding supplement (ENSURE PLUS HIGH PROTEIN) LIQD Take 237 mLs by mouth 3 (three) times daily between meals. 02/20/24   Gonfa, Taye T, MD  folic acid  (FOLVITE ) 1 MG tablet Take 1 tablet (1 mg total) by mouth daily. 02/21/24   Gonfa, Taye T, MD  melatonin 5 MG TABS Take 1 tablet (5 mg total) by mouth at bedtime as needed (insomnia). 02/20/24   Gonfa, Taye T, MD  midodrine  (PROAMATINE ) 5 MG tablet Take 1 tablet (5 mg total) by mouth 3 (three) times daily with meals. 03/30/24   Fairy Frames, MD  Multiple Vitamin (MULTIVITAMIN WITH MINERALS) TABS tablet Take 1 tablet by mouth daily. 11/25/20   Jadine Toribio SQUIBB, MD  tamsulosin  (FLOMAX ) 0.4 MG CAPS capsule Take 1 capsule (0.4 mg total) by mouth daily after breakfast. 02/17/24   Trixie Nilda HERO, MD  thiamine  (VITAMIN B-1) 100 MG tablet Take 1 tablet (100 mg total) by mouth daily.  02/21/24   Gonfa, Taye T, MD  tiotropium (SPIRIVA ) 18 MCG inhalation capsule Place 18 mcg into inhaler and inhale daily. Patient not taking: Reported on 04/02/2024    [provider]  Torsemide  40 MG TABS Take 60 mg by mouth in the morning and at bedtime. 04/02/24   Marcine Catalan M, PA-C  traZODone  (DESYREL ) 50 MG tablet Take 50 mg by mouth at bedtime.    [provider]  TUBERCULIN PPD ID Inject 5 Units into the skin once a week.    [provider]    Physical Exam: Constitutional: Rightly built and nourished. Vitals:   04/06/24 1647 04/06/24 2127 04/06/24 2152  04/06/24 2213  BP:  117/83 116/71 112/82  Pulse:  90 95 97  Resp:  16 16 16   Temp:  97.6 F (36.4 C) 97.9 F (36.6 C) 98 F (36.7 C)  TempSrc:  Oral Oral Oral  SpO2:  100% 100% 100%  Weight: 77.1 kg      Eyes: Anicteric no pallor. ENMT: No discharge from the ears eyes nose or mouth. Neck: No mass felt.  No neck rigidity. Respiratory: No rhonchi or crepitations. Cardiovascular: S1-S2 heard. Abdomen: Soft nontender bowel sound present. Musculoskeletal: No edema. Skin: No rash. Neurologic: Alert awake oriented to time place and person.  Moves all extremities. Psychiatric: Appears normal.  Normal affect.   Labs on Admission: I have personally reviewed following labs and imaging studies  CBC: Recent Labs  Lab 04/06/24 1710  WBC 4.3  NEUTROABS 2.7  HGB 7.4*  HCT 25.4*  MCV 77.9*  PLT 203   Basic Metabolic Panel: Recent Labs  Lab 03/31/24 0228 04/01/24 0238 04/06/24 1710  NA 132* 135 138  K 3.5 3.4* 3.6  CL 91* 94* 98  CO2 29 28 25   GLUCOSE 101* 140* 104*  BUN 94* 93* 75*  CREATININE 2.16* 2.07* 2.66*  CALCIUM  8.5* 8.3* 9.0  MG  --   --  2.2   GFR: Estimated Creatinine Clearance: 27 mL/min (A) (by C-G formula based on SCr of 2.66 mg/dL (H)). Liver Function Tests: Recent Labs  Lab 04/06/24 1932  AST 58*  ALT 25  ALKPHOS 50  BILITOT 1.9*  PROT 7.6  ALBUMIN 2.8*   No results for input(s): LIPASE, AMYLASE in the last 168 hours. No results for input(s): AMMONIA in the last 168 hours. Coagulation Profile: No results for input(s): INR, PROTIME in the last 168 hours. Cardiac Enzymes: No results for input(s): CKTOTAL, CKMB, CKMBINDEX, TROPONINI in the last 168 hours. BNP (last 3 results) No results for input(s): PROBNP in the last 8760 hours. HbA1C: No results for input(s): HGBA1C in the last 72 hours. CBG: No results for input(s): GLUCAP in the last 168 hours. Lipid Profile: No results for input(s): CHOL, HDL, LDLCALC,  TRIG, CHOLHDL, LDLDIRECT in the last 72 hours. Thyroid Function Tests: No results for input(s): TSH, T4TOTAL, FREET4, T3FREE, THYROIDAB in the last 72 hours. Anemia Panel: No results for input(s): VITAMINB12, FOLATE, FERRITIN, TIBC, IRON, RETICCTPCT in the last 72 hours. Urine analysis:    Component Value Date/Time   COLORURINE AMBER (A) 03/17/2024 1446   APPEARANCEUR HAZY (A) 03/17/2024 1446   APPEARANCEUR Clear 11/30/2020 1006   LABSPEC 1.014 03/17/2024 1446   PHURINE 5.0 03/17/2024 1446   GLUCOSEU NEGATIVE 03/17/2024 1446   HGBUR NEGATIVE 03/17/2024 1446   BILIRUBINUR NEGATIVE 03/17/2024 1446   BILIRUBINUR Negative 11/30/2020 1006   KETONESUR NEGATIVE 03/17/2024 1446   PROTEINUR 30 (A) 03/17/2024 1446  NITRITE NEGATIVE 03/17/2024 1446   LEUKOCYTESUR LARGE (A) 03/17/2024 1446   Sepsis Labs: @LABRCNTIP (procalcitonin:4,lacticidven:4) )No results found for this or any previous visit (from the past 240 hours).   Radiological Exams on Admission: DG Chest Portable 1 View Result Date: 04/06/2024 CLINICAL DATA:  Shortness of breath. EXAM: PORTABLE CHEST 1 VIEW COMPARISON:  Chest radiograph dated 03/17/2024 FINDINGS: Evaluation is limited due to positioning and exclusion of portion of the left lung from the image. There is mild cardiomegaly with mild vascular congestion and edema. Pneumonia is not excluded. No pleural effusion or pneumothorax identified. Bilateral hilar prominence suggestive of pulmonary hypertension. No acute osseous pathology. IMPRESSION: 1. Cardiomegaly with vascular congestion and mild edema. Pneumonia is not excluded. 2. Pulmonary hypertension. Electronically Signed   By: Vanetta Chou M.D.   On: 04/06/2024 20:11    EKG: Independently reviewed.  Normal sinus rhythm.  Assessment/Plan Principal Problem:   GI bleed    GI bleed received 1 unit of PRBC transfusion.  Dr. Avram [facility] was consulted.  On IV Protonix .  Liquid diet.   Check serial CBC. Anemia likely multifactorial including renal disease and now concerning for GI bleed.  Received 1 unit of PRBC.  Recheck CBC. Chronic HFrEF last EF measured was 20 to 25%.  Takes torsemide  40 mg daily which we will continue.  Did receive 80 mg IV Lasix  in the ER when patient was receiving PRBC.  Follow-up respiratory status.  Goal-directed medical therapy limited due to hypotension and renal failure. Chronic kidney disease stage IV creatinine slightly elevated from baseline likely from volume loss from blood loss. COPD not actively wheezing continue inhalers. Hyperlipidemia on statins.   Since patient has GI bleeding will need close monitoring further workup and more than 2 midnight stay.   DVT prophylaxis: SCDs. Code Status: Full code. Family Communication: Discussed with patient. Disposition Plan: Monitored bed. Consults called: GI. Admission status: Observation.

## 2024-04-06 NOTE — ED Triage Notes (Signed)
 Pt BIB [facility] from [facility] with CC of weakness, fatigue, and reported low HgB of 6.6 from his SNF. Pt is Aox4 and endorses weakness, fatigue, and mild SOB.

## 2024-04-07 ENCOUNTER — Encounter (HOSPITAL_COMMUNITY): Payer: Self-pay | Admitting: Internal Medicine

## 2024-04-07 DIAGNOSIS — N184 Chronic kidney disease, stage 4 (severe): Secondary | ICD-10-CM | POA: Diagnosis not present

## 2024-04-07 DIAGNOSIS — R195 Other fecal abnormalities: Secondary | ICD-10-CM | POA: Diagnosis not present

## 2024-04-07 DIAGNOSIS — I5043 Acute on chronic combined systolic (congestive) and diastolic (congestive) heart failure: Secondary | ICD-10-CM | POA: Diagnosis not present

## 2024-04-07 DIAGNOSIS — K922 Gastrointestinal hemorrhage, unspecified: Secondary | ICD-10-CM | POA: Diagnosis present

## 2024-04-07 DIAGNOSIS — D61818 Other pancytopenia: Secondary | ICD-10-CM | POA: Diagnosis not present

## 2024-04-07 DIAGNOSIS — J810 Acute pulmonary edema: Secondary | ICD-10-CM | POA: Diagnosis not present

## 2024-04-07 LAB — BASIC METABOLIC PANEL WITH GFR
Anion gap: 16 — ABNORMAL HIGH (ref 5–15)
BUN: 77 mg/dL — ABNORMAL HIGH (ref 8–23)
CO2: 22 mmol/L (ref 22–32)
Calcium: 8.8 mg/dL — ABNORMAL LOW (ref 8.9–10.3)
Chloride: 101 mmol/L (ref 98–111)
Creatinine, Ser: 2.49 mg/dL — ABNORMAL HIGH (ref 0.61–1.24)
GFR, Estimated: 27 mL/min — ABNORMAL LOW (ref >60)
Glucose, Bld: 100 mg/dL — ABNORMAL HIGH (ref 70–99)
Potassium: 3.8 mmol/L (ref 3.5–5.1)
Sodium: 139 mmol/L (ref 135–145)

## 2024-04-07 LAB — CBC WITH DIFFERENTIAL/PLATELET
Abs Immature Granulocytes: 0.01 K/uL (ref 0.00–0.07)
Basophils Absolute: 0 K/uL (ref 0.0–0.1)
Basophils Relative: 0 %
Eosinophils Absolute: 0.1 K/uL (ref 0.0–0.5)
Eosinophils Relative: 1 %
HCT: 26.8 % — ABNORMAL LOW (ref 39.0–52.0)
Hemoglobin: 8.2 g/dL — ABNORMAL LOW (ref 13.0–17.0)
Immature Granulocytes: 0 %
Lymphocytes Relative: 19 %
Lymphs Abs: 0.9 K/uL (ref 0.7–4.0)
MCH: 23.7 pg — ABNORMAL LOW (ref 26.0–34.0)
MCHC: 30.6 g/dL (ref 30.0–36.0)
MCV: 77.5 fL — ABNORMAL LOW (ref 80.0–100.0)
Monocytes Absolute: 0.8 K/uL (ref 0.1–1.0)
Monocytes Relative: 18 %
Neutro Abs: 2.8 K/uL (ref 1.7–7.7)
Neutrophils Relative %: 62 %
Platelets: 177 K/uL (ref 150–400)
RBC: 3.46 MIL/uL — ABNORMAL LOW (ref 4.22–5.81)
RDW: 23.2 % — ABNORMAL HIGH (ref 11.5–15.5)
WBC: 4.6 K/uL (ref 4.0–10.5)
nRBC: 0 % (ref 0.0–0.2)

## 2024-04-07 LAB — BPAM RBC
Blood Product Expiration Date: 202510032359
ISSUE DATE / TIME: 202509092137
Unit Type and Rh: 5100

## 2024-04-07 LAB — HEPATIC FUNCTION PANEL
ALT: 23 U/L (ref 0–44)
AST: 50 U/L — ABNORMAL HIGH (ref 15–41)
Albumin: 2.8 g/dL — ABNORMAL LOW (ref 3.5–5.0)
Alkaline Phosphatase: 52 U/L (ref 38–126)
Bilirubin, Direct: 0.9 mg/dL — ABNORMAL HIGH (ref 0.0–0.2)
Indirect Bilirubin: 1.3 mg/dL — ABNORMAL HIGH (ref 0.3–0.9)
Total Bilirubin: 2.2 mg/dL — ABNORMAL HIGH (ref 0.0–1.2)
Total Protein: 7.2 g/dL (ref 6.5–8.1)

## 2024-04-07 LAB — TYPE AND SCREEN
ABO/RH(D): O POS
Antibody Screen: NEGATIVE
Unit division: 0

## 2024-04-07 LAB — LIPASE, BLOOD: Lipase: 102 U/L — ABNORMAL HIGH (ref 11–51)

## 2024-04-07 LAB — TROPONIN I (HIGH SENSITIVITY): Troponin I (High Sensitivity): 69 ng/L — ABNORMAL HIGH (ref <18)

## 2024-04-07 LAB — MAGNESIUM: Magnesium: 2 mg/dL (ref 1.7–2.4)

## 2024-04-07 MED ORDER — ALBUTEROL SULFATE (2.5 MG/3ML) 0.083% IN NEBU: 2.5000 mg | INHALATION_SOLUTION | Freq: Four times a day (QID) | RESPIRATORY_TRACT | Status: DC | PRN

## 2024-04-07 MED ORDER — MELATONIN 5 MG PO TABS
5.0000 mg | ORAL_TABLET | Freq: Every evening | ORAL | Status: DC | PRN
  Filled 2024-04-07: qty 1

## 2024-04-07 MED ORDER — ADULT MULTIVITAMIN W/MINERALS CH
1.0000 | ORAL_TABLET | Freq: Every day | ORAL | Status: DC
  Administered 2024-04-07 – 2024-04-10 (×4): 1 via ORAL
  Filled 2024-04-07 (×4): qty 1

## 2024-04-07 MED ORDER — ALBUTEROL SULFATE HFA 108 (90 BASE) MCG/ACT IN AERS: 2.0000 | INHALATION_SPRAY | Freq: Four times a day (QID) | RESPIRATORY_TRACT | Status: DC | PRN

## 2024-04-07 MED ORDER — MIDODRINE HCL 5 MG PO TABS
5.0000 mg | ORAL_TABLET | Freq: Three times a day (TID) | ORAL | Status: DC
  Administered 2024-04-07 – 2024-04-10 (×10): 5 mg via ORAL
  Filled 2024-04-07 (×11): qty 1

## 2024-04-07 MED ORDER — THIAMINE MONONITRATE 100 MG PO TABS
100.0000 mg | ORAL_TABLET | Freq: Every day | ORAL | Status: DC
Start: 2024-04-07 — End: 2024-04-10
  Administered 2024-04-07 – 2024-04-10 (×4): 100 mg via ORAL
  Filled 2024-04-07 (×4): qty 1

## 2024-04-07 MED ORDER — FOLIC ACID 1 MG PO TABS
1.0000 mg | ORAL_TABLET | Freq: Every day | ORAL | Status: DC
Start: 2024-04-07 — End: 2024-04-10
  Administered 2024-04-07 – 2024-04-10 (×4): 1 mg via ORAL
  Filled 2024-04-07 (×4): qty 1

## 2024-04-07 MED ORDER — TAMSULOSIN HCL 0.4 MG PO CAPS
0.4000 mg | ORAL_CAPSULE | Freq: Every day | ORAL | Status: DC
  Administered 2024-04-07 – 2024-04-10 (×4): 0.4 mg via ORAL
  Filled 2024-04-07 (×5): qty 1

## 2024-04-07 MED ORDER — TRAZODONE HCL 50 MG PO TABS
50.0000 mg | ORAL_TABLET | Freq: Every day | ORAL | Status: DC
  Administered 2024-04-07 – 2024-04-08 (×2): 50 mg via ORAL
  Filled 2024-04-07 (×3): qty 1

## 2024-04-07 MED ORDER — TIOTROPIUM BROMIDE MONOHYDRATE 18 MCG IN CAPS
18.0000 ug | ORAL_CAPSULE | Freq: Every day | RESPIRATORY_TRACT | Status: DC
Start: 2024-04-07 — End: 2024-04-07

## 2024-04-07 MED ORDER — TORSEMIDE 20 MG PO TABS
40.0000 mg | ORAL_TABLET | Freq: Every day | ORAL | Status: DC
  Administered 2024-04-07 – 2024-04-10 (×4): 40 mg via ORAL
  Filled 2024-04-07 (×4): qty 2

## 2024-04-07 MED ORDER — UMECLIDINIUM BROMIDE 62.5 MCG/ACT IN AEPB
1.0000 | INHALATION_SPRAY | Freq: Every day | RESPIRATORY_TRACT | Status: DC
  Administered 2024-04-07 – 2024-04-10 (×4): 1 via RESPIRATORY_TRACT
  Filled 2024-04-07: qty 7

## 2024-04-07 MED ORDER — ATORVASTATIN CALCIUM 40 MG PO TABS
40.0000 mg | ORAL_TABLET | Freq: Every evening | ORAL | Status: DC
  Administered 2024-04-07 – 2024-04-08 (×2): 40 mg via ORAL
  Filled 2024-04-07 (×3): qty 1

## 2024-04-07 NOTE — Plan of Care (Signed)

## 2024-04-07 NOTE — Progress Notes (Signed)
 [id number] [facility] Liaison Note  Received a referral for hospice services at home after discharge from Cardiology.  Spoke with patient bedside and family on phone to initiate education related to hospice philosophy, services and team approach to care. Patient/family verbalized understanding of information given.  Per discussion, the plan is for discharge to facility either with hospice at facility or outpatient palliative care at the facility.  Jessica Hardcastle [phone] is the family contact.  Please send signed and completed DNR home with the patient.  Please provide prescriptions at discharge as needed to ensure ongoing symptom management.  [facility] information and contact numbers given to Harlene, daughter.  Above information  shared with Corean Canary, TOC.  Please call with any questions or concerns.  Thank you for the opportunity to participate in this patient's care.  Inocente Jacobs, BSN, RN [employer] [phone]

## 2024-04-07 NOTE — Progress Notes (Signed)
 PROGRESS NOTE  [name] Welcome FMW:[id number] DOB: [birth year] DOA: 04/06/2024 PCP: Pcp, No  HPI/Recap of past 24 hours: Joseph Tanner is a 73 y.o. male with history of chronic HFrEF, chronic kidney disease stage IV, BPH, hyperlipidemia, CAD was referred to the ER after patient's hemoglobin at the facility was found to be around 6.  Patient is a poor historian.  Denies any chest pain or shortness of breath.  Patient recently was admitted for CHF exacerbation had followed up with heart failure clinic 4 days ago and was to referred to hospice.  Patient states he has not noticed any blood in the stools. In the ER patient's hemoglobin is around 7.4 decreased from 8.7 from last month.  Stool for occult blood was positive.  ER physician discussed with on-call GI, Dr. Avram will see patient in consult.  Started on Protonix  IV, 1 unit of PRBC transfusion ordered after 80 mg IV Lasix  given.  Patient admitted for further management.    Today, patient denies any new complaints, no worsening shortness of breath or any acute chest pain.  Denies any bleeding.  Agreeable for hospice care.  Hospice team notified, recommend hospice at this facility/outpatient.   Assessment/Plan: Principal Problem:   GI bleed Active Problems:   Acute on chronic systolic CHF (congestive heart failure) (HCC)   CKD (chronic kidney disease) stage 4, GFR 15-29 ml/min (HCC)   Acute GI bleeding   Possible GI bleed Possible acute blood loss anemia/microcytic anemia/anemia from CKD Hemoglobin found to be around 6 at facility, 7.4 in ER which has decreased from 8.7 about a month ago FOBT positive Received 1 unit of PRBC on 9/9 GI consulted, poor candidate for any endoscopic intervention Continue IV Protonix  Monitor CBC and transfuse  Chronic HFrEF Mildly elevated troponin, flat trend, chronic History of CAD Last EF on 01/2024 showed 20 to 25%, global hypokinesis, grade 1 diastolic dysfunction, moderately  elevated pulmonary artery systolic pressure Goal directed medical therapy limited due to hypotension and renal failure Declined ICD in the past Continue torsemide , monitor closely  Hypotension Continue midodrine   CKD stage IV Creatinine slightly elevated from baseline Daily BMP  COPD Stable Continue inhalers  Hyperlipidemia Continue statins  History of cocaine abuse Has not been using cocaine since recent admissions/SNF  Goals of care discussion Patient is currently DNR Overall very poor prognosis given end-stage heart failure, CKD, frequent hospitalization, now with possible GI bleed Hospice consulted     Estimated body mass index is 21.25 kg/m as calculated from the following:   Height as of this encounter: 6' 3 (1.905 m).   Weight as of this encounter: 77.1 kg.     Code Status: DNR  Family Communication: None at bedside  Disposition Plan: Status is: Observation The patient remains OBS appropriate and will d/c before 2 midnights.      Consultants: GI  Procedures: None  Antimicrobials: None  DVT prophylaxis: SCDs Hold AC due to possible GI bleed   Objective: Vitals:   04/07/24 0915 04/07/24 1035 04/07/24 1238 04/07/24 1316  BP: 104/77  103/79   Pulse: 91  87   Resp: 18  17   Temp:  97.9 F (36.6 C) (!) 97.4 F (36.3 C)   TempSrc:  Oral Oral   SpO2: 100%  100%   Weight:      Height:    6' 3 (1.905 m)    Intake/Output Summary (Last 24 hours) at 04/07/2024 1541 Last data filed at 04/07/2024 1400 Gross per 24  hour  Intake 868.17 ml  Output --  Net 868.17 ml   Filed Weights   04/06/24 1647  Weight: 77.1 kg    Exam: General: NAD, appears chronically ill, noted finger clubbing Cardiovascular: S1, S2 present Respiratory: Diminished breath sounds bilaterally Abdomen: Soft, nontender, nondistended, bowel sounds present Musculoskeletal: No bilateral pedal edema noted Skin: Normal Psychiatry: Normal mood     Data  Reviewed: CBC: Recent Labs  Lab 04/06/24 1710 04/07/24 0400  WBC 4.3 4.6  NEUTROABS 2.7 2.8  HGB 7.4* 8.2*  HCT 25.4* 26.8*  MCV 77.9* 77.5*  PLT 203 177   Basic Metabolic Panel: Recent Labs  Lab 04/01/24 0238 04/06/24 1710 04/07/24 0400  NA 135 138 139  K 3.4* 3.6 3.8  CL 94* 98 101  CO2 28 25 22   GLUCOSE 140* 104* 100*  BUN 93* 75* 77*  CREATININE 2.07* 2.66* 2.49*  CALCIUM  8.3* 9.0 8.8*  MG  --  2.2 2.0   GFR: Estimated Creatinine Clearance: 28.8 mL/min (A) (by C-G formula based on SCr of 2.49 mg/dL (H)). Liver Function Tests: Recent Labs  Lab 04/06/24 1932 04/07/24 0400  AST 58* 50*  ALT 25 23  ALKPHOS 50 52  BILITOT 1.9* 2.2*  PROT 7.6 7.2  ALBUMIN 2.8* 2.8*   Recent Labs  Lab 04/06/24 1932  LIPASE 102*   No results for input(s): AMMONIA in the last 168 hours. Coagulation Profile: No results for input(s): INR, PROTIME in the last 168 hours. Cardiac Enzymes: No results for input(s): CKTOTAL, CKMB, CKMBINDEX, TROPONINI in the last 168 hours. BNP (last 3 results) No results for input(s): PROBNP in the last 8760 hours. HbA1C: No results for input(s): HGBA1C in the last 72 hours. CBG: No results for input(s): GLUCAP in the last 168 hours. Lipid Profile: No results for input(s): CHOL, HDL, LDLCALC, TRIG, CHOLHDL, LDLDIRECT in the last 72 hours. Thyroid Function Tests: No results for input(s): TSH, T4TOTAL, FREET4, T3FREE, THYROIDAB in the last 72 hours. Anemia Panel: No results for input(s): VITAMINB12, FOLATE, FERRITIN, TIBC, IRON, RETICCTPCT in the last 72 hours. Urine analysis:    Component Value Date/Time   COLORURINE AMBER (A) 03/17/2024 1446   APPEARANCEUR HAZY (A) 03/17/2024 1446   APPEARANCEUR Clear 11/30/2020 1006   LABSPEC 1.014 03/17/2024 1446   PHURINE 5.0 03/17/2024 1446   GLUCOSEU NEGATIVE 03/17/2024 1446   HGBUR NEGATIVE 03/17/2024 1446   BILIRUBINUR NEGATIVE 03/17/2024 1446    BILIRUBINUR Negative 11/30/2020 1006   KETONESUR NEGATIVE 03/17/2024 1446   PROTEINUR 30 (A) 03/17/2024 1446   NITRITE NEGATIVE 03/17/2024 1446   LEUKOCYTESUR LARGE (A) 03/17/2024 1446   Sepsis Labs: @LABRCNTIP (procalcitonin:4,lacticidven:4)  )No results found for this or any previous visit (from the past 240 hours).    Studies: DG Chest Portable 1 View Result Date: 04/06/2024 CLINICAL DATA:  Shortness of breath. EXAM: PORTABLE CHEST 1 VIEW COMPARISON:  Chest radiograph dated 03/17/2024 FINDINGS: Evaluation is limited due to positioning and exclusion of portion of the left lung from the image. There is mild cardiomegaly with mild vascular congestion and edema. Pneumonia is not excluded. No pleural effusion or pneumothorax identified. Bilateral hilar prominence suggestive of pulmonary hypertension. No acute osseous pathology. IMPRESSION: 1. Cardiomegaly with vascular congestion and mild edema. Pneumonia is not excluded. 2. Pulmonary hypertension. Electronically Signed   By: Vanetta Chou M.D.   On: 04/06/2024 20:11    Scheduled Meds:  atorvastatin   40 mg Oral QPM   folic acid   1 mg Oral Daily   midodrine   5 mg Oral TID WC   multivitamin with minerals  1 tablet Oral Daily   pantoprazole  (PROTONIX ) IV  40 mg Intravenous Q12H   tamsulosin   0.4 mg Oral QPC breakfast   thiamine   100 mg Oral Daily   torsemide   40 mg Oral Daily   traZODone   50 mg Oral QHS   umeclidinium bromide   1 puff Inhalation Daily    Continuous Infusions:   LOS: 0 days     Joseph JINNY Cage, MD [facility]  If 7PM-7AM, please contact night-coverage [email or web address] 04/07/2024, 3:41 PM

## 2024-04-07 NOTE — Care Management (Signed)
 Transition of Care [facility]) - Inpatient Brief Assessment   Patient Details  Name: Joseph Tanner MRN: [id number] Date of Birth: 06-14-1951  Transition of Care [facility]) CM/SW Contact:    Corean JAYSON Canary, RN Phone Number: 04/07/2024, 8:58 AM   Clinical Narrative: 73 year old patient just discharged to [facility] healthcare presented with CHF, has chronic end stage heart failure referred to hospice [facility] by cardiology. They will follow at discharge   Transition of Care Asessment: Insurance and Status: Insurance coverage has been reviewed Patient has primary care physician: Yes Home environment has been reviewed: In SNF [facility] Prior level of function:: Needs assist     Readmission risk has been reviewed: Yes Transition of care needs: transition of care needs identified, TOC will continue to follow

## 2024-04-07 NOTE — Consult Note (Addendum)
 Consultation  Referring Provider: [facility] Primary Care Physician:  Pcp, No Primary Gastroenterologist: Sam[place]  Reason for Consultation: Anemia, heme positive stool  HPI: Joseph Tanner is a 73 y.o. male currently residing in a skilled nursing facility with several recent admissions.  He was brought to the emergency room last night after outpatient labs showed a hemoglobin of 6.6. Repeat hemoglobin here was 7.4.  His baseline hemoglobin appears to be in the 8 -9 range. Patient has no GI complaints, specifically no complaints of abdominal discomfort nausea vomiting hematemesis, no recent change in bowel habits and he has not been aware of any melena or hematochezia. Patient is not on anticoagulation He is on torsemide  and midodrine  for severe ischemic cardiomyopathy with chronic hypotension.  Most recent echo done in July 2025 showed EF of 20 to 25%.  He also has significant coronary artery disease being managed medically, COPD, and chronic kidney disease.  He has long history of cocaine abuse, has been abstinent since recent admissions and SNF. Per notes he apparently did have a blood transfusion about a month ago with one of his hospitalizations. He has been followed by the heart failure clinic and just seen there on 04/02/2024.  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  Per those notes he has had multiple admissions this summer, initially in July with acute congestive heart failure, then twice since then for diuresis per internal medicine. His CODE STATUS was changed to DNR with last admission. He was just discharged back to the SNF last week.  Cardiology had discussed with the patient that he is in end-stage heart failure and that he was appropriate for hospice and per the notes patient was accepting of this.  He says he really has not felt any different over the past several days, denies any chest pain, no significant shortness of breath and  again no GI symptoms.  He reports that he did have prior EGD and colonoscopy 3 to 5 years ago while he was living in [place] .  He says the colonoscopy was negative.  He is not clear about any findings at EGD but feels that they were both done for screening purposes.  Labs today-WBC 4.6/hemoglobin 8.2/hematocrit 26.8/MCV 77.5/platelets 177 Stool heme positive Sodium 139/potassium 3.8/BUN 77/creatinine 2.49 Troponin 75> 69 BNP 3125 LFTs with T. bili 1.9/AST 58/ALT 25/alk phos 50  Chest x-ray last p.m. with cardiomegaly with vascular congestion, mild edema pneumonia not excluded and evidence of pulmonary hypertension     Past Medical History:  Diagnosis Date   COPD (chronic obstructive pulmonary disease) (HCC)    Hyperlipemia    Hypertension    NSTEMI (non-ST elevated myocardial infarction) (HCC)    Pure hypercholesterolemia 03/15/2021    History reviewed. No pertinent surgical history.  Prior to Admission medications   Medication Sig Start Date End Date Taking? Authorizing Provider  acetaminophen  (TYLENOL ) 325 MG tablet Take 2 tablets (650 mg total) by mouth every 6 (six) hours as needed for mild pain (pain score 1-3) or fever (or Fever >/= 101). 02/20/24  Yes Gonfa, Taye T, MD  albuterol  (VENTOLIN  HFA) 108 (90 Base) MCG/ACT inhaler Inhale 2 puffs into the lungs every 6 (six) hours as needed for wheezing or shortness of breath.   Yes [provider]  atorvastatin  (LIPITOR) 40 MG tablet Take 1 tablet (40 mg total) by mouth every evening. 01/29/23  Yes Conte, Tessa N, PA-C  feeding supplement (ENSURE PLUS HIGH PROTEIN) LIQD Take  237 mLs by mouth 3 (three) times daily between meals. 02/20/24  Yes Gonfa, Taye T, MD  folic acid  (FOLVITE ) 1 MG tablet Take 1 tablet (1 mg total) by mouth daily. 02/21/24  Yes Gonfa, Taye T, MD  melatonin 5 MG TABS Take 1 tablet (5 mg total) by mouth at bedtime as needed (insomnia). 02/20/24  Yes Gonfa, Taye T, MD  midodrine  (PROAMATINE ) 5  MG tablet Take 1 tablet (5 mg total) by mouth 3 (three) times daily with meals. 03/30/24  Yes Fairy Frames, MD  Multiple Vitamin (MULTIVITAMIN WITH MINERALS) TABS tablet Take 1 tablet by mouth daily. 11/25/20  Yes Jadine Toribio SQUIBB, MD  tamsulosin  (FLOMAX ) 0.4 MG CAPS capsule Take 1 capsule (0.4 mg total) by mouth daily after breakfast. 02/17/24  Yes Gherghe, Costin M, MD  thiamine  (VITAMIN B-1) 100 MG tablet Take 1 tablet (100 mg total) by mouth daily. 02/21/24  Yes Gonfa, Taye T, MD  tiotropium (SPIRIVA ) 18 MCG inhalation capsule Place 18 mcg into inhaler and inhale daily.   Yes [provider]  Torsemide  40 MG TABS Take 60 mg by mouth in the morning and at bedtime. Patient taking differently: Take 80 mg by mouth in the morning. 04/02/24  Yes Marcine Catalan M, PA-C  traZODone  (DESYREL ) 50 MG tablet Take 50 mg by mouth at bedtime.   Yes [provider]  umeclidinium bromide  (INCRUSE ELLIPTA ) 62.5 MCG/ACT AEPB Inhale 1 puff into the lungs daily.   Yes [provider]    Current Facility-Administered Medications  Medication Dose Route Frequency Provider Last Rate Last Admin   albuterol  (PROVENTIL ) (2.5 MG/3ML) 0.083% nebulizer solution 2.5 mg  2.5 mg Nebulization Q6H PRN Chen, Lydia D, RPH       atorvastatin  (LIPITOR) tablet 40 mg  40 mg Oral QPM Franky Redia SAILOR, MD       folic acid  (FOLVITE ) tablet 1 mg  1 mg Oral Daily Kakrakandy, Arshad N, MD   1 mg at 04/07/24 9056   melatonin tablet 5 mg  5 mg Oral QHS PRN Franky Redia SAILOR, MD       midodrine  (PROAMATINE ) tablet 5 mg  5 mg Oral TID WC Franky Redia SAILOR, MD   5 mg at 04/07/24 9275   multivitamin with minerals tablet 1 tablet  1 tablet Oral Daily Franky Redia SAILOR, MD   1 tablet at 04/07/24 9057   pantoprazole  (PROTONIX ) injection 40 mg  40 mg Intravenous Q12H Melvenia Motto, MD   40 mg at 04/07/24 9056   tamsulosin  (FLOMAX ) capsule 0.4 mg  0.4 mg Oral QPC breakfast Franky Redia SAILOR, MD   0.4 mg at  04/07/24 9056   thiamine  (VITAMIN B1) tablet 100 mg  100 mg Oral Daily Franky Redia SAILOR, MD   100 mg at 04/07/24 9057   torsemide  (DEMADEX ) tablet 40 mg  40 mg Oral Daily Franky Redia SAILOR, MD   40 mg at 04/07/24 9056   traZODone  (DESYREL ) tablet 50 mg  50 mg Oral QHS Franky Redia SAILOR, MD       umeclidinium bromide  (INCRUSE ELLIPTA ) 62.5 MCG/ACT 1 puff  1 puff Inhalation Daily Chen, Lydia D, RPH   1 puff at 04/07/24 1006   Current Outpatient Medications  Medication Sig Dispense Refill   acetaminophen  (TYLENOL ) 325 MG tablet Take 2 tablets (650 mg total) by mouth every 6 (six) hours as needed for mild pain (pain score 1-3) or fever (or Fever >/= 101).     albuterol  (VENTOLIN  HFA) 108 (  90 Base) MCG/ACT inhaler Inhale 2 puffs into the lungs every 6 (six) hours as needed for wheezing or shortness of breath.     atorvastatin  (LIPITOR) 40 MG tablet Take 1 tablet (40 mg total) by mouth every evening. 90 tablet 2   feeding supplement (ENSURE PLUS HIGH PROTEIN) LIQD Take 237 mLs by mouth 3 (three) times daily between meals.     folic acid  (FOLVITE ) 1 MG tablet Take 1 tablet (1 mg total) by mouth daily.     melatonin 5 MG TABS Take 1 tablet (5 mg total) by mouth at bedtime as needed (insomnia).     midodrine  (PROAMATINE ) 5 MG tablet Take 1 tablet (5 mg total) by mouth 3 (three) times daily with meals. 90 tablet 0   Multiple Vitamin (MULTIVITAMIN WITH MINERALS) TABS tablet Take 1 tablet by mouth daily.     tamsulosin  (FLOMAX ) 0.4 MG CAPS capsule Take 1 capsule (0.4 mg total) by mouth daily after breakfast. 90 capsule 0   thiamine  (VITAMIN B-1) 100 MG tablet Take 1 tablet (100 mg total) by mouth daily.     tiotropium (SPIRIVA ) 18 MCG inhalation capsule Place 18 mcg into inhaler and inhale daily.     Torsemide  40 MG TABS Take 60 mg by mouth in the morning and at bedtime. (Patient taking differently: Take 80 mg by mouth in the morning.)     traZODone  (DESYREL ) 50 MG tablet Take 50 mg by mouth at  bedtime.     umeclidinium bromide  (INCRUSE ELLIPTA ) 62.5 MCG/ACT AEPB Inhale 1 puff into the lungs daily.      Allergies as of 04/06/2024   (No Known Allergies)    Family History  Problem Relation Age of Onset   Heart failure Mother    Heart failure Maternal Grandmother    Heart failure Maternal Grandfather    Heart failure Paternal Grandmother    Heart failure Paternal Grandfather     Social History   Socioeconomic History   Marital status: Divorced    Spouse name: Not on file   Number of children: 5   Years of education: Not on file   Highest education level: Associate degree: academic program  Occupational History   Occupation: Retired  Tobacco Use   Smoking status: Some Days    Types: Cigarettes   Smokeless tobacco: Never  Vaping Use   Vaping status: Never Used  Substance and Sexual Activity   Alcohol use: Not Currently   Drug use: Yes    Types: Cocaine    Comment: 6 months   Sexual activity: Not on file  Other Topics Concern   Not on file  Social History Narrative   Not on file   Social Drivers of Health   Financial Resource Strain: Low Risk  (06/17/2023)   Overall Financial Resource Strain (CARDIA)    Difficulty of Paying Living Expenses: Not very hard  Food Insecurity: No Food Insecurity (03/18/2024)   Hunger Vital Sign    Worried About Running Out of Food in the Last Year: Never true    Ran Out of Food in the Last Year: Never true  Transportation Needs: No Transportation Needs (03/18/2024)   PRAPARE - Administrator, Civil Service (Medical): No    Lack of Transportation (Non-Medical): No  Physical Activity: Not on file  Stress: Not on file  Social Connections: Moderately Isolated (03/18/2024)   Social Connection and Isolation Panel    Frequency of Communication with Friends and Family: More than three times a  week    Frequency of Social Gatherings with Friends and Family: More than three times a week    Attends Religious Services: More  than 4 times per year    Active Member of [employer] or Organizations: No    Attends Banker Meetings: Never    Marital Status: Never married  Intimate Partner Violence: Not At Risk (03/18/2024)   Humiliation, Afraid, Rape, and Kick questionnaire    Fear of Current or Ex-Partner: No    Emotionally Abused: No    Physically Abused: No    Sexually Abused: No    Review of Systems: Pertinent positive and negative review of systems were noted in the above HPI section.  All other review of systems was otherwise negative.   Physical Exam: Vital signs in last 24 hours: Temp:  [97.6 F (36.4 C)-98.7 F (37.1 C)] 97.9 F (36.6 C) (09/10 1035) Pulse Rate:  [88-100] 91 (09/10 0915) Resp:  [16-35] 18 (09/10 0915) BP: (95-117)/(63-91) 104/77 (09/10 0915) SpO2:  [97 %-100 %] 100 % (09/10 0915) Weight:  [77.1 kg] 77.1 kg (09/09 1647)   General:   Alert,  Well-developed, thin, frail-appearing elderly African-American male pleasant and cooperative in NAD Head:  Normocephalic and atraumatic. Eyes:  Sclera clear, no icterus.   Conjunctiva pink. Ears:  Normal auditory acuity. Nose:  No deformity, discharge,  or lesions. Mouth:  No deformity or lesions.   Neck:  Supple; no masses or thyromegaly. Lungs: No increased work of breathing, few basilar rales  Heart:  Regular rate and rhythm; no murmurs, clicks, rubs,  or gallops. Abdomen:  Soft,nontender, BS active,nonpalp mass or hsm.   Rectal: Not done, documented heme positive per Hemoccult last p.m. Msk:  Symmetrical without gross deformities. . Pulses:  Normal pulses noted. Extremities:  Without clubbing or edema.  Significant clubbing of nails Neurologic:  Alert and  oriented x4;  grossly normal neurologically. Skin:  Intact without significant lesions or rashes.. Psych:  Alert and cooperative. Normal mood and affect.  Intake/Output from previous day: 09/09 0701 - 09/10 0700 In: 514.2 [I.V.:34.2; Blood:480] Out: -  Intake/Output this  shift: No intake/output data recorded.  Lab Results: Recent Labs    04/06/24 1710 04/07/24 0400  WBC 4.3 4.6  HGB 7.4* 8.2*  HCT 25.4* 26.8*  PLT 203 177   BMET Recent Labs    04/06/24 1710 04/07/24 0400  NA 138 139  K 3.6 3.8  CL 98 101  CO2 25 22  GLUCOSE 104* 100*  BUN 75* 77*  CREATININE 2.66* 2.49*  CALCIUM  9.0 8.8*   LFT Recent Labs    04/07/24 0400  PROT 7.2  ALBUMIN 2.8*  AST 50*  ALT 23  ALKPHOS 52  BILITOT 2.2*  BILIDIR 0.9*  IBILI 1.3*   PT/INR No results for input(s): LABPROT, INR in the last 72 hours. Hepatitis Panel No results for input(s): HEPBSAG, HCVAB, HEPAIGM, HEPBIGM in the last 72 hours.    IMPRESSION:  #46 73 year old African-American male with end-stage heart failure with EF of approximately 20% has declined ICD and not a candidate for other advanced therapies. Patient has had several admissions over the past couple of months with acute exacerbations of heart failure and per cardiology is felt appropriate for hospice at this time due to end-stage disease.  #2 sent to the ER last p.m. after outpatient hemoglobin at skilled nursing facility returned at 6.6 Repeat hemoglobins here have been in the 7-8.4 range He is documented to have heme positive stool  but has not had any evidence of overt bleeding, no melena or hematochezia.  He really has not had a significant change in his hemoglobin over the past several days and his baseline hemoglobin over the past months has been more in the 8-9 range  #3 pulmonary hypertension #4 elevated troponins and BNP-consistent with acute exacerbation of CHF #5 COPD no oxygen use# #6.  History of hypotension now on midodrine  #7 history of cocaine abuse and active over the past month since in SNF   Plan; cover patient with twice daily PPI, oral okay Trend hemoglobin and transfuse as indicated This patient is a very high risk candidate for any endoscopic evaluation and very high risk for  complications with sedation.   No plans for endoscopic evaluation, and certainly not clear that he has had any active GI bleeding as hemoglobin is relatively stable and stool is just documented as heme positive.  Please ask palliative care to see him  Continue to manage supportively/conservatively from a GI perspective. I did discuss with the patient that he is very high risk for any endoscopic evaluation or sedation and that would advise against this.  He is in agreement with this plan  GI will be available if needed.   Amy Esterwood PA-C 04/07/2024, 11:32 AM   Attending physician's note  I personally saw the patient and performed a substantive portion of the medical decision making process for this encounter (including a complete performance of the key components : MDM, Hx and Exam), in conjunction with the APP.  I reviewed labs, imaging and EMR.  I agree with the APP's note, impression, and  the management plan for the number and complexity of problems addressed at the encounter for the patient and take responsibility for that plan with its inherent risk of complications, morbidity, or mortality with additional input as follows.    73 year old very pleasant gentleman brought in from skilled nursing facility with decline in hemoglobin and heme positive stool No overt GI bleed  On exam abdomen soft no distention or tenderness Cachectic-appearing  Patient with severe ischemic cardiomyopathy, EF 20 to 25% with cardiac cachexia  Patient is at significant mortality with anesthesia and procedures, do not recommend endoscopic evaluation for Hemoccult subacute to chronic GI blood loss Palliative care consult to discuss goals of care Monitor hemoglobin and transfuse as needed PPI twice daily p.o. Continue diet as tolerated  GI signing off, please call GI back if needed    The patient was provided an opportunity to ask questions and all were answered. The patient agreed with the plan and  demonstrated an understanding of the instructions.  LOIS Wilkie Mcgee , MD [phone]

## 2024-04-08 DIAGNOSIS — K922 Gastrointestinal hemorrhage, unspecified: Secondary | ICD-10-CM | POA: Diagnosis not present

## 2024-04-08 LAB — HEMOGLOBIN AND HEMATOCRIT, BLOOD
HCT: 27.6 % — ABNORMAL LOW (ref 39.0–52.0)
Hemoglobin: 8.3 g/dL — ABNORMAL LOW (ref 13.0–17.0)

## 2024-04-08 LAB — BASIC METABOLIC PANEL WITH GFR
Anion gap: 14 (ref 5–15)
BUN: 71 mg/dL — ABNORMAL HIGH (ref 8–23)
CO2: 23 mmol/L (ref 22–32)
Calcium: 8.8 mg/dL — ABNORMAL LOW (ref 8.9–10.3)
Chloride: 99 mmol/L (ref 98–111)
Creatinine, Ser: 2.68 mg/dL — ABNORMAL HIGH (ref 0.61–1.24)
GFR, Estimated: 24 mL/min — ABNORMAL LOW
Glucose, Bld: 95 mg/dL (ref 70–99)
Potassium: 3.5 mmol/L (ref 3.5–5.1)
Sodium: 136 mmol/L (ref 135–145)

## 2024-04-08 NOTE — Plan of Care (Signed)

## 2024-04-08 NOTE — Plan of Care (Signed)
   Problem: Education: Goal: Knowledge of General Education information will improve Description: Including pain rating scale, medication(s)/side effects and non-pharmacologic comfort measures Outcome: Progressing   Problem: Activity: Goal: Risk for activity intolerance will decrease Outcome: Progressing   Problem: Nutrition: Goal: Adequate nutrition will be maintained Outcome: Progressing   Problem: Coping: Goal: Level of anxiety will decrease Outcome: Progressing   Problem: Elimination: Goal: Will not experience complications related to bowel motility Outcome: Progressing   Problem: Safety: Goal: Ability to remain free from injury will improve Outcome: Progressing

## 2024-04-08 NOTE — Care Management Obs Status (Signed)
 MEDICARE OBSERVATION STATUS NOTIFICATION   Patient Details  Name: Joseph Tanner MRN: [id number] Date of Birth: June 04, 1951   Medicare Observation Status Notification Given:  Yes    Jon Cruel 04/08/2024, 9:14 AM

## 2024-04-08 NOTE — Progress Notes (Signed)
 PROGRESS NOTE  Joseph Tanner FMW:[id number] DOB: [birth year] DOA: 04/06/2024 PCP: Pcp, No  HPI/Recap of past 24 hours: Joseph Tanner is a 73 y.o. male with history of chronic HFrEF, chronic kidney disease stage IV, BPH, hyperlipidemia, CAD was referred to the ER after patient's hemoglobin at the facility was found to be around 6.  Patient is a poor historian.  Denies any chest pain or shortness of breath.  Patient recently was admitted for CHF exacerbation had followed up with heart failure clinic 4 days ago and was to referred to hospice.  Patient states he has not noticed any blood in the stools. In the ER patient's hemoglobin is around 7.4 decreased from 8.7 from last month.  Stool for occult blood was positive.  ER physician discussed with on-call GI, Dr. Avram will see patient in consult.  Started on Protonix  IV, 1 unit of PRBC transfusion ordered after 80 mg IV Lasix  given.  Patient admitted for further management.    Today, patient denies any new complaints.    Assessment/Plan: Principal Problem:   GI bleed Active Problems:   Acute on chronic systolic CHF (congestive heart failure) (HCC)   CKD (chronic kidney disease) stage 4, GFR 15-29 ml/min (HCC)   Acute GI bleeding   Possible GI bleed Possible acute blood loss anemia/microcytic anemia/anemia from CKD Hemoglobin found to be around 6 at facility, 7.4 in ER which has decreased from 8.7 about a month ago FOBT positive Received 1 unit of PRBC on 9/9 GI consulted, poor candidate for any endoscopic intervention Continue IV Protonix  Monitor CBC and transfuse  Chronic HFrEF Mildly elevated troponin, flat trend, chronic History of CAD Last EF on 01/2024 showed 20 to 25%, global hypokinesis, grade 1 diastolic dysfunction, moderately elevated pulmonary artery systolic pressure Goal directed medical therapy limited due to hypotension and renal failure Declined ICD in the past Continue torsemide , monitor  closely  Hypotension Continue midodrine   CKD stage IV Creatinine slightly elevated from baseline Daily BMP  COPD Stable Continue inhalers  Hyperlipidemia Continue statins  History of cocaine abuse Has not been using cocaine since recent admissions/SNF  Goals of care discussion Patient is currently DNR Overall very poor prognosis given end-stage heart failure, CKD, frequent hospitalization, now with possible GI bleed Hospice consulted     Estimated body mass index is 21.25 kg/m as calculated from the following:   Height as of this encounter: 6' 3 (1.905 m).   Weight as of this encounter: 77.1 kg.     Code Status: DNR  Family Communication: None at bedside  Disposition Plan: Status is: Observation The patient remains OBS appropriate and will d/c before 2 midnights.      Consultants: GI  Procedures: None  Antimicrobials: None  DVT prophylaxis: SCDs Hold AC due to possible GI bleed   Objective: Vitals:   04/08/24 0839 04/08/24 0910 04/08/24 1200 04/08/24 1543  BP:  110/78 93/62 95/71   Pulse:  88 93 98  Resp:  18 18 17   Temp:  98.6 F (37 C) 98.2 F (36.8 C) 98.2 F (36.8 C)  TempSrc:    Oral  SpO2: 100% 94% 100% 100%  Weight:      Height:        Intake/Output Summary (Last 24 hours) at 04/08/2024 1951 Last data filed at 04/08/2024 0433 Gross per 24 hour  Intake --  Output 400 ml  Net -400 ml   Filed Weights   04/06/24 1647  Weight: 77.1 kg    Exam:  General: NAD, appears chronically ill, noted finger clubbing Cardiovascular: S1, S2 present Respiratory: Diminished breath sounds bilaterally Abdomen: Soft, nontender, nondistended, bowel sounds present Musculoskeletal: No bilateral pedal edema noted Skin: Normal Psychiatry: Normal mood     Data Reviewed: CBC: Recent Labs  Lab 04/06/24 1710 04/07/24 0400 04/08/24 0727  WBC 4.3 4.6  --   NEUTROABS 2.7 2.8  --   HGB 7.4* 8.2* 8.3*  HCT 25.4* 26.8* 27.6*  MCV 77.9* 77.5*  --    PLT 203 177  --    Basic Metabolic Panel: Recent Labs  Lab 04/06/24 1710 04/07/24 0400 04/08/24 0727  NA 138 139 136  K 3.6 3.8 3.5  CL 98 101 99  CO2 25 22 23   GLUCOSE 104* 100* 95  BUN 75* 77* 71*  CREATININE 2.66* 2.49* 2.68*  CALCIUM  9.0 8.8* 8.8*  MG 2.2 2.0  --    GFR: Estimated Creatinine Clearance: 26.8 mL/min (A) (by C-G formula based on SCr of 2.68 mg/dL (H)). Liver Function Tests: Recent Labs  Lab 04/06/24 1932 04/07/24 0400  AST 58* 50*  ALT 25 23  ALKPHOS 50 52  BILITOT 1.9* 2.2*  PROT 7.6 7.2  ALBUMIN 2.8* 2.8*   Recent Labs  Lab 04/06/24 1932  LIPASE 102*   No results for input(s): AMMONIA in the last 168 hours. Coagulation Profile: No results for input(s): INR, PROTIME in the last 168 hours. Cardiac Enzymes: No results for input(s): CKTOTAL, CKMB, CKMBINDEX, TROPONINI in the last 168 hours. BNP (last 3 results) No results for input(s): PROBNP in the last 8760 hours. HbA1C: No results for input(s): HGBA1C in the last 72 hours. CBG: No results for input(s): GLUCAP in the last 168 hours. Lipid Profile: No results for input(s): CHOL, HDL, LDLCALC, TRIG, CHOLHDL, LDLDIRECT in the last 72 hours. Thyroid Function Tests: No results for input(s): TSH, T4TOTAL, FREET4, T3FREE, THYROIDAB in the last 72 hours. Anemia Panel: No results for input(s): VITAMINB12, FOLATE, FERRITIN, TIBC, IRON, RETICCTPCT in the last 72 hours. Urine analysis:    Component Value Date/Time   COLORURINE AMBER (A) 03/17/2024 1446   APPEARANCEUR HAZY (A) 03/17/2024 1446   APPEARANCEUR Clear 11/30/2020 1006   LABSPEC 1.014 03/17/2024 1446   PHURINE 5.0 03/17/2024 1446   GLUCOSEU NEGATIVE 03/17/2024 1446   HGBUR NEGATIVE 03/17/2024 1446   BILIRUBINUR NEGATIVE 03/17/2024 1446   BILIRUBINUR Negative 11/30/2020 1006   KETONESUR NEGATIVE 03/17/2024 1446   PROTEINUR 30 (A) 03/17/2024 1446   NITRITE NEGATIVE 03/17/2024 1446    LEUKOCYTESUR LARGE (A) 03/17/2024 1446   Sepsis Labs: @LABRCNTIP (procalcitonin:4,lacticidven:4)  )No results found for this or any previous visit (from the past 240 hours).    Studies: No results found.   Scheduled Meds:  atorvastatin   40 mg Oral QPM   folic acid   1 mg Oral Daily   midodrine   5 mg Oral TID WC   multivitamin with minerals  1 tablet Oral Daily   pantoprazole  (PROTONIX ) IV  40 mg Intravenous Q12H   tamsulosin   0.4 mg Oral QPC breakfast   thiamine   100 mg Oral Daily   torsemide   40 mg Oral Daily   traZODone   50 mg Oral QHS   umeclidinium bromide   1 puff Inhalation Daily    Continuous Infusions:   LOS: 0 days     Lebron JINNY Cage, MD [facility]  If 7PM-7AM, please contact night-coverage [email or web address] 04/08/2024, 7:51 PM

## 2024-04-08 NOTE — Progress Notes (Signed)
 Call from [facility] informing Pt has 6 runs of vtach.

## 2024-04-09 DIAGNOSIS — K922 Gastrointestinal hemorrhage, unspecified: Secondary | ICD-10-CM | POA: Diagnosis not present

## 2024-04-09 LAB — BASIC METABOLIC PANEL WITH GFR
Anion gap: 15 (ref 5–15)
BUN: 74 mg/dL — ABNORMAL HIGH (ref 8–23)
CO2: 22 mmol/L (ref 22–32)
Calcium: 8.5 mg/dL — ABNORMAL LOW (ref 8.9–10.3)
Chloride: 97 mmol/L — ABNORMAL LOW (ref 98–111)
Creatinine, Ser: 2.59 mg/dL — ABNORMAL HIGH (ref 0.61–1.24)
GFR, Estimated: 25 mL/min — ABNORMAL LOW (ref >60)
Glucose, Bld: 97 mg/dL (ref 70–99)
Potassium: 3.4 mmol/L — ABNORMAL LOW (ref 3.5–5.1)
Sodium: 134 mmol/L — ABNORMAL LOW (ref 135–145)

## 2024-04-09 LAB — HEMOGLOBIN AND HEMATOCRIT, BLOOD
HCT: 26 % — ABNORMAL LOW (ref 39.0–52.0)
Hemoglobin: 7.9 g/dL — ABNORMAL LOW (ref 13.0–17.0)

## 2024-04-09 LAB — MAGNESIUM: Magnesium: 1.8 mg/dL (ref 1.7–2.4)

## 2024-04-09 MED ORDER — HEPARIN SODIUM (PORCINE) 5000 UNIT/ML IJ SOLN
5000.0000 [IU] | Freq: Three times a day (TID) | INTRAMUSCULAR | Status: DC
  Filled 2024-04-09 (×2): qty 1

## 2024-04-09 MED ORDER — PANTOPRAZOLE SODIUM 40 MG PO TBEC
40.0000 mg | DELAYED_RELEASE_TABLET | Freq: Two times a day (BID) | ORAL | Status: DC
  Administered 2024-04-10: 40 mg via ORAL
  Filled 2024-04-09 (×2): qty 1

## 2024-04-09 MED ORDER — POTASSIUM CHLORIDE CRYS ER 20 MEQ PO TBCR
40.0000 meq | EXTENDED_RELEASE_TABLET | Freq: Once | ORAL | Status: AC
  Administered 2024-04-09: 40 meq via ORAL
  Filled 2024-04-09: qty 2

## 2024-04-09 NOTE — Progress Notes (Signed)
 PROGRESS NOTE  Joseph Tanner FMW:[id number] DOB: [birth year] DOA: 04/06/2024 PCP: Pcp, No  HPI/Recap of past 24 hours: Joseph Tanner is a 73 y.o. male with history of chronic HFrEF, chronic kidney disease stage IV, BPH, hyperlipidemia, CAD was referred to the ER after patient's hemoglobin at the facility was found to be around 6.  Patient is a poor historian.  Denies any chest pain or shortness of breath.  Patient recently was admitted for CHF exacerbation had followed up with heart failure clinic 4 days ago and was to referred to hospice.  Patient states he has not noticed any blood in the stools. In the ER patient's hemoglobin is around 7.4 decreased from 8.7 from last month.  Stool for occult blood was positive.  ER physician discussed with on-call GI, Dr. Avram will see patient in consult.  Started on Protonix  IV, 1 unit of PRBC transfusion ordered after 80 mg IV Lasix  given.  Patient admitted for further management.    Today, patient denies any new complaints.  Multiple questions answered.    Assessment/Plan: Principal Problem:   GI bleed Active Problems:   Acute on chronic systolic CHF (congestive heart failure) (HCC)   CKD (chronic kidney disease) stage 4, GFR 15-29 ml/min (HCC)   Acute GI bleeding   Possible GI bleed Possible acute blood loss anemia/microcytic anemia/anemia from CKD Hemoglobin found to be around 6 at facility, 7.4 in ER which has decreased from 8.7 about a month ago FOBT positive Received 1 unit of PRBC on 9/9 GI consulted, poor candidate for any endoscopic intervention Continue IV Protonix  Monitor CBC and transfuse  Chronic HFrEF Mildly elevated troponin, flat trend, chronic History of CAD Last EF on 01/2024 showed 20 to 25%, global hypokinesis, grade 1 diastolic dysfunction, moderately elevated pulmonary artery systolic pressure Goal directed medical therapy limited due to hypotension and renal failure Declined ICD in the  past Continue torsemide , monitor closely  Hypokalemia Replace as needed  Hypotension Continue midodrine   CKD stage IV Creatinine slightly elevated from baseline Daily BMP  COPD Stable Continue inhalers  Hyperlipidemia Continue statins  History of cocaine abuse Has not been using cocaine since recent admissions/SNF  Goals of care discussion Patient is currently DNR Overall very poor prognosis given end-stage heart failure, CKD, frequent hospitalization, now with possible GI bleed Hospice consulted     Estimated body mass index is 21.25 kg/m as calculated from the following:   Height as of this encounter: 6' 3 (1.905 m).   Weight as of this encounter: 77.1 kg.     Code Status: DNR  Family Communication: None at bedside  Disposition Plan: Status is: Observation The patient remains OBS appropriate and will d/c before 2 midnights.      Consultants: GI  Procedures: None  Antimicrobials: None  DVT prophylaxis: Heparin  [place]   Objective: Vitals:   04/08/24 2019 04/09/24 0507 04/09/24 0928 04/09/24 1710  BP: 95/68 93/62 (!) 86/58 96/74  Pulse: 93 86 88 95  Resp: 17 17 18 16   Temp: 98 F (36.7 C) 97.9 F (36.6 C) (!) 97.5 F (36.4 C) (!) 97.5 F (36.4 C)  TempSrc: Oral Oral Oral Oral  SpO2: 100% 100% 100% 100%  Weight:      Height:        Intake/Output Summary (Last 24 hours) at 04/09/2024 1925 Last data filed at 04/09/2024 1121 Gross per 24 hour  Intake 1180 ml  Output 300 ml  Net 880 ml   [employer]  04/06/24 1647  Weight: 77.1 kg    Exam: General: NAD, appears chronically ill, noted finger clubbing Cardiovascular: S1, S2 present Respiratory: Diminished breath sounds bilaterally Abdomen: Soft, nontender, nondistended, bowel sounds present Musculoskeletal: No bilateral pedal edema noted Skin: Normal Psychiatry: Normal mood     Data Reviewed: CBC: Recent Labs  Lab 04/06/24 1710 04/07/24 0400 04/08/24 0727 04/09/24 0419   WBC 4.3 4.6  --   --   NEUTROABS 2.7 2.8  --   --   HGB 7.4* 8.2* 8.3* 7.9*  HCT 25.4* 26.8* 27.6* 26.0*  MCV 77.9* 77.5*  --   --   PLT 203 177  --   --    Basic Metabolic Panel: Recent Labs  Lab 04/06/24 1710 04/07/24 0400 04/08/24 0727 04/09/24 0419  NA 138 139 136 134*  K 3.6 3.8 3.5 3.4*  CL 98 101 99 97*  CO2 25 22 23 22   GLUCOSE 104* 100* 95 97  BUN 75* 77* 71* 74*  CREATININE 2.66* 2.49* 2.68* 2.59*  CALCIUM  9.0 8.8* 8.8* 8.5*  MG 2.2 2.0  --  1.8   GFR: Estimated Creatinine Clearance: 27.7 mL/min (A) (by C-G formula based on SCr of 2.59 mg/dL (H)). Liver Function Tests: Recent Labs  Lab 04/06/24 1932 04/07/24 0400  AST 58* 50*  ALT 25 23  ALKPHOS 50 52  BILITOT 1.9* 2.2*  PROT 7.6 7.2  ALBUMIN 2.8* 2.8*   Recent Labs  Lab 04/06/24 1932  LIPASE 102*   No results for input(s): AMMONIA in the last 168 hours. Coagulation Profile: No results for input(s): INR, PROTIME in the last 168 hours. Cardiac Enzymes: No results for input(s): CKTOTAL, CKMB, CKMBINDEX, TROPONINI in the last 168 hours. BNP (last 3 results) No results for input(s): PROBNP in the last 8760 hours. HbA1C: No results for input(s): HGBA1C in the last 72 hours. CBG: No results for input(s): GLUCAP in the last 168 hours. Lipid Profile: No results for input(s): CHOL, HDL, LDLCALC, TRIG, CHOLHDL, LDLDIRECT in the last 72 hours. Thyroid Function Tests: No results for input(s): TSH, T4TOTAL, FREET4, T3FREE, THYROIDAB in the last 72 hours. Anemia Panel: No results for input(s): VITAMINB12, FOLATE, FERRITIN, TIBC, IRON, RETICCTPCT in the last 72 hours. Urine analysis:    Component Value Date/Time   COLORURINE AMBER (A) 03/17/2024 1446   APPEARANCEUR HAZY (A) 03/17/2024 1446   APPEARANCEUR Clear 11/30/2020 1006   LABSPEC 1.014 03/17/2024 1446   PHURINE 5.0 03/17/2024 1446   GLUCOSEU NEGATIVE 03/17/2024 1446   HGBUR NEGATIVE  03/17/2024 1446   BILIRUBINUR NEGATIVE 03/17/2024 1446   BILIRUBINUR Negative 11/30/2020 1006   KETONESUR NEGATIVE 03/17/2024 1446   PROTEINUR 30 (A) 03/17/2024 1446   NITRITE NEGATIVE 03/17/2024 1446   LEUKOCYTESUR LARGE (A) 03/17/2024 1446   Sepsis Labs: @LABRCNTIP (procalcitonin:4,lacticidven:4)  )No results found for this or any previous visit (from the past 240 hours).    Studies: No results found.   Scheduled Meds:  atorvastatin   40 mg Oral QPM   folic acid   1 mg Oral Daily   midodrine   5 mg Oral TID WC   multivitamin with minerals  1 tablet Oral Daily   pantoprazole  (PROTONIX ) IV  40 mg Intravenous Q12H   tamsulosin   0.4 mg Oral QPC breakfast   thiamine   100 mg Oral Daily   torsemide   40 mg Oral Daily   traZODone   50 mg Oral QHS   umeclidinium bromide   1 puff Inhalation Daily    Continuous Infusions:   LOS: 0  days     Joseph JINNY Cage, MD [facility]  If 7PM-7AM, please contact night-coverage [email or web address] 04/09/2024, 7:25 PM

## 2024-04-09 NOTE — TOC Initial Note (Signed)
 Transition of Care [facility]) - Initial/Assessment Note    Patient Details  Name: Joseph Tanner MRN: [id number] Date of Birth: 11-19-1950  [facility] CM/SW Contact:    Jeoffrey LITTIE Maranda ISRAEL Phone Number: 04/09/2024, 2:56 PM  Clinical Narrative:                 Pt admitted from [facility] due to weakness and fatigue. CSW completed SNF workup with pt. Pt stated he did not want to go to a SNF and he would prefer to go home. Pt gave CSW verbal permission to contact his daughter, Harlene. CSW attempted to call Harlene but she did not answer. CSW left her a voicemail and provided call back information. CSW will continue to follow.   Expected Discharge Plan: Skilled Nursing Facility Barriers to Discharge: Continued Medical Work up   Patient Goals and CMS Choice            Expected Discharge Plan and Services       Living arrangements for the past 2 months: Single Family Home                                      Prior Living Arrangements/Services Living arrangements for the past 2 months: Single Family Home Lives with:: Self Patient language and need for interpreter reviewed:: Yes Do you feel safe going back to the place where you live?: Yes      Need for Family Participation in Patient Care: Yes (Comment) Care giver support system in place?: Yes (comment)   Criminal Activity/Legal Involvement Pertinent to Current Situation/Hospitalization: No - Comment as needed  Activities of Daily Living   ADL Screening (condition at time of admission) Independently performs ADLs?: Yes (appropriate for developmental age) Is the patient deaf or have difficulty hearing?: No Does the patient have difficulty seeing, even when wearing glasses/contacts?: No Does the patient have difficulty concentrating, remembering, or making decisions?: No  Permission Sought/Granted Permission sought to share information with : Family Supports    Share Information with  NAME: Harlene     Permission granted to share info w Relationship: Daughter  Permission granted to share info w Contact Information: [phone]  Emotional Assessment Appearance:: Appears stated age Attitude/Demeanor/Rapport: Engaged Affect (typically observed): Accepting Orientation: : Oriented to Self, Oriented to Place, Oriented to  Time, Oriented to Situation Alcohol / Substance Use: Not Applicable Psych Involvement: No (comment)  Admission diagnosis:  GI bleed [K92.2] Acute GI bleeding [K92.2] Symptomatic anemia [D64.9] Gastrointestinal hemorrhage, unspecified gastrointestinal hemorrhage type [K92.2] Patient Active Problem List   Diagnosis Date Noted   Acute GI bleeding 04/07/2024   GI bleed 04/06/2024   Heart failure (HCC) 03/19/2024   UTI (urinary tract infection) 03/19/2024   Cardiogenic shock (HCC) 02/12/2024   CAP (community acquired pneumonia) 02/10/2024   Urinary tract infection 02/10/2024   Lactic acidosis 02/10/2024   Normal anion gap metabolic acidosis 02/10/2024   Elevated troponin 02/10/2024   Hyperkalemia 02/10/2024   Pancytopenia (HCC) 02/10/2024   Hypomagnesemia 02/10/2024   Hypotension 06/17/2023   Cocaine abuse (HCC) 06/17/2023   Acute on chronic combined systolic and diastolic CHF (congestive heart failure) (HCC) 06/17/2023   Acute kidney injury superimposed on chronic kidney disease (HCC) 06/14/2023   Pure hypercholesterolemia 03/15/2021   Demand ischemia (HCC) 11/21/2020   Coronary artery disease 11/21/2020   CKD (chronic kidney disease) stage 4, GFR 15-29 ml/min (HCC) 11/21/2020  Nephrolithiasis 11/21/2020   Pyuria 11/21/2020   Protein-calorie malnutrition, severe 11/21/2020   Acute on chronic systolic CHF (congestive heart failure) (HCC) 11/20/2020   NSTEMI (non-ST elevated myocardial infarction) (HCC) 10/14/2020   PCP:  Freddrick, No Pharmacy:   [facility] [id number], [place] - [address] [place] [address] Phone: [phone] Fax: [phone]  [facility] [address] [place] [address] Phone: [phone] Fax: [phone]  [facility] [place], [place] - [address] [place] [address] Phone: [phone] Fax: [phone]     Social Drivers of Health (SDOH) Social History: SDOH Screenings   Food Insecurity: Food Insecurity Present (04/07/2024)  Housing: Low Risk  (04/07/2024)  Transportation Needs: No Transportation Needs (04/07/2024)  Utilities: Not At Risk (04/07/2024)  Alcohol Screen: Low Risk  (06/17/2023)  Financial Resource Strain: Low Risk  (06/17/2023)  Social Connections: Unknown (04/07/2024)  Recent Concern: Social Connections - Moderately Isolated (03/18/2024)  Tobacco Use: High Risk (04/07/2024)   SDOH Interventions:     Readmission Risk Interventions    02/11/2024   11:47 AM  Readmission Risk Prevention Plan  Transportation Screening Complete  PCP or Specialist Appt within 5-7 Days Complete  Home Care Screening Complete  Medication Review (RN CM) Complete

## 2024-04-09 NOTE — Progress Notes (Signed)
 Mobility Specialist Progress Note:    04/09/24 1731  Mobility  Activity Ambulated independently  Level of Assistance Standby assist, set-up cues, supervision of patient - no hands on  Assistive Device None  Distance Ambulated (ft) 10 ft  Activity Response Tolerated well  Mobility Referral Yes  Mobility visit 1 Mobility  Mobility Specialist Start Time (ACUTE ONLY) 1731  Mobility Specialist Stop Time (ACUTE ONLY) 1736  Mobility Specialist Time Calculation (min) (ACUTE ONLY) 5 min   Received pt irritable laying in bed but agreeable to do walk in room. No c/o any symptoms. Steps seemed slight off balance at times but otherwise looked strong and good. Refused to use RW. Returned pt to bed w/ all needs met.   Venetia Keel Mobility Specialist Please Neurosurgeon or Rehab Office at [phone]

## 2024-04-09 NOTE — Progress Notes (Signed)
 PHARMACIST - PHYSICIAN COMMUNICATION  DR:   Donnamarie  CONCERNING: IV to Oral Route Change Policy  RECOMMENDATION: This patient is receiving Protonix  by the intravenous route.  Based on criteria approved by the Pharmacy and Therapeutics Committee, the intravenous medication(s) is/are being converted to the equivalent oral dose form(s).   DESCRIPTION: These criteria include: The patient is eating (either orally or via tube) and/or has been taking other orally administered medications for a least 24 hours The patient has no evidence of active gastrointestinal bleeding or impaired GI absorption (gastrectomy, short bowel, patient on TNA or NPO).  If you have questions about this conversion, please contact the Pharmacy Department  []   [phone] )  [facility] []   [phone] )  [facility] [x]   [phone] )  [facility] []   [phone] )  [facility] []   [phone] )  [facility]   Rocky Slade, PharmD, BCPS 04/09/2024 8:34 PM

## 2024-04-09 NOTE — Evaluation (Signed)
 Physical Therapy Evaluation Patient Details Name: Joseph Tanner MRN: [id number] DOB: [birth year] Today's Date: 04/09/2024  History of Present Illness  Joseph Tanner is a 73 y.o. male who presented from rehab for hgb of 6.6. PMHx: recent admit for CHF, HTN, HLD, CHF, NSTEMI, CAD, COPD, CKD, and cocaine abuse  Clinical Impression  Pt admitted with above. Pt typically is independent and lives alone, however, with recent hospital admission, pt was weaker and discharged to [facility] SNF. OT spoke to pt daughter, who wishes pt to return to rehab to continue treatment to regain strength. Pt received sitting up in chair upon entry. Pt declines use of AD, ambulating bed room distance with CGA for safety. Denies dizziness; BP 92/65 (75). Pt declines hallway ambulation, but does not specify reason. Patient will benefit from continued inpatient follow up therapy, <3 hours/day in order to address further balance, strengthening, endurance.      If plan is discharge home, recommend the following: Assistance with cooking/housework;Assist for transportation;Supervision due to cognitive status;Direct supervision/assist for financial management   Can travel by private vehicle   Yes    Equipment Recommendations None recommended by PT  Recommendations for Other Services       Functional Status Assessment Patient has had a recent decline in their functional status and demonstrates the ability to make significant improvements in function in a reasonable and predictable amount of time.     Precautions / Restrictions Precautions Precautions: Fall Restrictions Weight Bearing Restrictions Per Provider Order: No      Mobility  Bed Mobility               General bed mobility comments: OOB on arrival    Transfers Overall transfer level: Needs assistance Equipment used: None Transfers: Sit to/from Stand Sit to Stand: Supervision           General transfer comment: pt declined AD     Ambulation/Gait Ambulation/Gait assistance: Contact guard assist Gait Distance (Feet): 25 Feet Assistive device: None Gait Pattern/deviations: Step-through pattern, Decreased stride length          Stairs            Wheelchair Mobility     Tilt Bed    Modified Rankin (Stroke Patients Only)       Balance Overall balance assessment: Needs assistance Sitting-balance support: Feet supported Sitting balance-Leahy Scale: Good     Standing balance support: No upper extremity supported Standing balance-Leahy Scale: Fair   Single Leg Stance - Right Leg:  (pt declined) Single Leg Stance - Left Leg:  (pt declined) Tandem Stance - Right Leg: 10 Tandem Stance - Left Leg: 10 [place]omberg - Eyes Opened: 10                   Pertinent Vitals/Pain Pain Assessment Pain Assessment: No/denies pain    Home Living Family/patient expects to be discharged to:: Private residence Living Arrangements: Alone Available Help at Discharge: Family;Available PRN/intermittently (dtr reports very little assist available at home) Type of Home: Apartment Home Access: Elevator;Stairs to enter Entrance Stairs-Rails: Can reach both Entrance Stairs-Number of Steps: 14/1 flight-only if elevator out of service   Home Layout: One level Home Equipment: Cane - single Librarian, academic (2 wheels) Additional Comments: pt has been at [facility] rehab for ~2 weeks, plan will likely be to go back there    Prior Function Prior Level of Function : Independent/Modified Independent             Mobility Comments: pt  reports mod I- inep ambulation at SNF ADLs Comments: mod I, pt has incontinence of bladder. Per his daughters report, he has also had uncontrolled stool and at home there was BM on the floors and he was too weak to clean up     Extremity/Trunk Assessment   Upper Extremity Assessment Upper Extremity Assessment: Overall WFL for tasks assessed    Lower Extremity Assessment Lower  Extremity Assessment: Overall WFL for tasks assessed    Cervical / Trunk Assessment Cervical / Trunk Assessment: Normal  Communication   Communication Communication: No apparent difficulties    Cognition Arousal: Alert Behavior During Therapy: WFL for tasks assessed/performed   PT - Cognitive impairments: Awareness, Memory                         Following commands: Intact       Cueing Cueing Techniques: Verbal cues     General Comments General comments (skin integrity, edema, etc.): VSS, BP low but asymptomatic    Exercises     Assessment/Plan    PT Assessment Patient needs continued PT services  PT Problem List Decreased strength;Decreased activity tolerance;Decreased balance;Decreased mobility;Cardiopulmonary status limiting activity       PT Treatment Interventions DME instruction;Gait training;Stair training;Functional mobility training;Therapeutic activities;Balance training;Patient/family education    PT Goals (Current goals can be found in the Care Plan section)  Acute Rehab PT Goals Patient Stated Goal: pt daughter would like him to continue rehab to get stronger PT Goal Formulation: With patient/family Time For Goal Achievement: 04/23/24 Potential to Achieve Goals: Good    Frequency Min 1X/week     Co-evaluation               AM-PAC PT 6 Clicks Mobility  Outcome Measure Help needed turning from your back to your side while in a flat bed without using bedrails?: None Help needed moving from lying on your back to sitting on the side of a flat bed without using bedrails?: None Help needed moving to and from a bed to a chair (including a wheelchair)?: A Little Help needed standing up from a chair using your arms (e.g., wheelchair or bedside chair)?: A Little Help needed to walk in hospital room?: A Little Help needed climbing 3-5 steps with a railing? : A Little 6 Click Score: 20    End of Session Equipment Utilized During  Treatment: Gait belt Activity Tolerance: Patient tolerated treatment well Patient left: in chair;with call bell/phone within reach Nurse Communication: Mobility status PT Visit Diagnosis: Difficulty in walking, not elsewhere classified (R26.2);Unsteadiness on feet (R26.81)    Time: 1040-1052 PT Time Calculation (min) (ACUTE ONLY): 12 min   Charges:   PT Evaluation $PT Eval Low Complexity: 1 Low   PT General Charges $$ ACUTE PT VISIT: 1 Visit         Aleck Daring, PT, DPT Acute Rehabilitation Services Office [phone]   Aleck ONEIDA Daring 04/09/2024, 10:58 AM

## 2024-04-09 NOTE — Evaluation (Signed)
 Occupational Therapy Evaluation Patient Details Name: Joseph Tanner MRN: [id number] DOB: November 09, 1950 Today's Date: 04/09/2024   History of Present Illness   Joseph Tanner is a 73 y.o. male who presented from rehab for hgb of 6.6. PMHx: recent admit for CHF, HTN, HLD, CHF, NSTEMI, CAD, COPD, CKD, and cocaine abuse     Clinical Impressions Avory was evaluated s/p the above admission list. At baseline, he lives alone and is indep. However, with multiple recent hospital admissions, he has become weak and was recently discharged to [facility]. Per his daughter, he has little to no support at home and the family would like for him to get stronger at rehab at discharge. Upon evaluation the pt was limited by weakness, unsteady gait and poor activity tolerance. Overall he needed CGA for transfers and mobility, pt declined use of AD. Due to the deficits listed below the pt also needs up to min A for LB ADLs and set up A for UB ADLs. Pt will benefit from continued acute OT services and skilled inpatient follow up therapy, <3 hours/day.      If plan is discharge home, recommend the following:   A little help with walking and/or transfers;A little help with bathing/dressing/bathroom;Assistance with cooking/housework;Assist for transportation;Help with stairs or ramp for entrance     Functional Status Assessment   Patient has had a recent decline in their functional status and demonstrates the ability to make significant improvements in function in a reasonable and predictable amount of time.     Equipment Recommendations   None recommended by OT      Precautions/Restrictions   Precautions Precautions: Fall Restrictions Weight Bearing Restrictions Per Provider Order: No     Mobility Bed Mobility               General bed mobility comments: OOB on arrival    Transfers Overall transfer level: Needs assistance Equipment used: None Transfers: Sit to/from  Stand Sit to Stand: Contact guard assist           General transfer comment: pt declined AD      Balance Overall balance assessment: Needs assistance Sitting-balance support: Feet supported Sitting balance-Leahy Scale: Good     Standing balance support: No upper extremity supported Standing balance-Leahy Scale: Fair         ADL either performed or assessed with clinical judgement   ADL Overall ADL's : Needs assistance/impaired Eating/Feeding: Independent   Grooming: Contact guard assist;Standing   Upper Body Bathing: Set up;Sitting   Lower Body Bathing: Sit to/from stand;Contact guard assist   Upper Body Dressing : Set up;Sitting   Lower Body Dressing: Minimal assistance;Sit to/from stand Lower Body Dressing Details (indicate cue type and reason): min A to don sweat panats Toilet Transfer: Contact guard Marine scientist Details (indicate cue type and reason): no AD Toileting- Clothing Manipulation and Hygiene: Modified independent;Sit to/from stand Toileting - Clothing Manipulation Details (indicate cue type and reason): incontinent bladder     Functional mobility during ADLs: Contact guard assist General ADL Comments: limited by weakness, unsteady gait, poor activity tolernace     Vision Baseline Vision/History: 0 No visual deficits Vision Assessment?: No apparent visual deficits     Perception Perception: Within Functional Limits       Praxis Praxis: WFL       Pertinent Vitals/Pain Pain Assessment Pain Assessment: No/denies pain     Extremity/Trunk Assessment Upper Extremity Assessment Upper Extremity Assessment: Overall WFL for tasks assessed;Generalized weakness   Lower Extremity  Assessment Lower Extremity Assessment: Generalized weakness   Cervical / Trunk Assessment Cervical / Trunk Assessment: Normal   Communication Communication Communication: No apparent difficulties   Cognition Arousal: Alert Behavior During  Therapy: Impulsive Cognition: Cognition impaired   Orientation impairments: Situation   Memory impairment (select all impairments): Declarative long-term memory   Executive functioning impairment (select all impairments): Reasoning OT - Cognition Comments: flat affect, requires encouragement, education and reasoning to participate. pt has littel insight to impairments                 Following commands: Intact       Cueing  General Comments   Cueing Techniques: Verbal cues  VSS, BP low but asymptomatic    Home Living Family/patient expects to be discharged to:: Private residence Living Arrangements: Alone Available Help at Discharge: Family;Available PRN/intermittently (dtr reports very little assist available at home) Type of Home: Apartment Home Access: Elevator;Stairs to enter Entrance Stairs-Number of Steps: 14/1 flight-only if elevator out of service Entrance Stairs-Rails: Can reach both Home Layout: One level     Bathroom Shower/Tub: Producer, television/film/video: Standard Bathroom Accessibility: Yes How Accessible: Accessible via walker Home Equipment: Cane - single point;Rolling Walker (2 wheels)   Additional Comments: pt has been at [facility] rehab for ~2 weeks, plan will likely be to go back there      Prior Functioning/Environment Prior Level of Function : Independent/Modified Independent             Mobility Comments: pt reports mod I- inep ambulation at SNF ADLs Comments: mod I, pt has incontinence of bladder. Per his daughters report, he has also had uncontrolled stool and at home there was BM on the floors and he was too weak to clean up    OT Problem List: Decreased activity tolerance;Decreased strength;Impaired balance (sitting and/or standing);Decreased knowledge of use of DME or AE;Cardiopulmonary status limiting activity   OT Treatment/Interventions: Self-care/ADL training;Therapeutic exercise;DME and/or AE instruction;Energy  conservation;Therapeutic activities;Balance training;Patient/family education      OT Goals(Current goals can be found in the care plan section)   Acute Rehab OT Goals Patient Stated Goal: to go home OT Goal Formulation: With patient Time For Goal Achievement: 04/23/24 Potential to Achieve Goals: Good ADL Goals Pt Will Perform Lower Body Dressing: Independently Pt Will Transfer to Toilet: Independently;ambulating Additional ADL Goal #1: Pt will complete at least 10 minutes of OOB activity to demonstrate improved tolerance for ADLs Additional ADL Goal #2: Pt will indep complete IADL ask to demonstrate safe ability to manage medication   OT Frequency:  Min 1X/week       AM-PAC OT 6 Clicks Daily Activity     Outcome Measure Help from another person eating meals?: None Help from another person taking care of personal grooming?: A Little Help from another person toileting, which includes using toliet, bedpan, or urinal?: A Little Help from another person bathing (including washing, rinsing, drying)?: A Little Help from another person to put on and taking off regular upper body clothing?: A Little Help from another person to put on and taking off regular lower body clothing?: A Little 6 Click Score: 19   End of Session    Activity Tolerance: Patient tolerated treatment well Patient left: in chair;with call bell/phone within reach  OT Visit Diagnosis: Unsteadiness on feet (R26.81);Muscle weakness (generalized) (M62.81)                Time: 9099-9074 OT Time Calculation (min): 25 min Charges:  OT General Charges $OT Visit: 1 Visit OT Evaluation $OT Eval Moderate Complexity: 1 Mod OT Treatments $Therapeutic Activity: 8-22 mins  Lucie Kendall, OTR/L Acute Rehabilitation Services Office [phone] Secure Chat Communication Preferred   Lucie JONETTA Kendall 04/09/2024, 9:39 AM

## 2024-04-09 NOTE — Plan of Care (Signed)
   Problem: Education: Goal: Knowledge of General Education information will improve Description Including pain rating scale, medication(s)/side effects and non-pharmacologic comfort measures Outcome: Progressing

## 2024-04-09 NOTE — Plan of Care (Signed)

## 2024-04-10 ENCOUNTER — Other Ambulatory Visit (HOSPITAL_COMMUNITY): Payer: Self-pay

## 2024-04-10 DIAGNOSIS — K922 Gastrointestinal hemorrhage, unspecified: Secondary | ICD-10-CM | POA: Diagnosis not present

## 2024-04-10 LAB — BASIC METABOLIC PANEL WITH GFR
Anion gap: 14 (ref 5–15)
BUN: 81 mg/dL — ABNORMAL HIGH (ref 8–23)
CO2: 20 mmol/L — ABNORMAL LOW (ref 22–32)
Calcium: 8.7 mg/dL — ABNORMAL LOW (ref 8.9–10.3)
Chloride: 97 mmol/L — ABNORMAL LOW (ref 98–111)
Creatinine, Ser: 2.82 mg/dL — ABNORMAL HIGH (ref 0.61–1.24)
GFR, Estimated: 23 mL/min — ABNORMAL LOW (ref >60)
Glucose, Bld: 96 mg/dL (ref 70–99)
Potassium: 3.8 mmol/L (ref 3.5–5.1)
Sodium: 131 mmol/L — ABNORMAL LOW (ref 135–145)

## 2024-04-10 LAB — HEMOGLOBIN AND HEMATOCRIT, BLOOD
HCT: 25.7 % — ABNORMAL LOW (ref 39.0–52.0)
Hemoglobin: 7.8 g/dL — ABNORMAL LOW (ref 13.0–17.0)

## 2024-04-10 MED ORDER — PANTOPRAZOLE SODIUM 40 MG PO TBEC
40.0000 mg | DELAYED_RELEASE_TABLET | Freq: Two times a day (BID) | ORAL | 0 refills | Status: AC
  Filled 2024-04-10: qty 60, 30d supply, fill #0

## 2024-04-10 MED ORDER — TORSEMIDE 40 MG PO TABS: 20.0000 mg | ORAL_TABLET | Freq: Every day | ORAL | Status: DC

## 2024-04-10 NOTE — Plan of Care (Signed)
   Problem: Education: Goal: Knowledge of General Education information will improve Description Including pain rating scale, medication(s)/side effects and non-pharmacologic comfort measures Outcome: Progressing

## 2024-04-10 NOTE — Progress Notes (Signed)
 [id number] [facility] Liaison Note   Received update that patient will now be discharged home with hospice services. Attempted to contact daughter Harlene x3 and left voice message each time in order to obtain address patient will be discharging home to as well as any DME needs. Will continue to try to follow up.   Please call with any questions or concerns.  Thank you for the opportunity to participate in this patient's care.   Nat Babe, BSN, RN [employer] [phone]

## 2024-04-10 NOTE — Discharge Summary (Signed)
 Physician Discharge Summary   Patient: Joseph Tanner MRN: [id number] DOB: [birth year]  Admit date:     04/06/2024  Discharge date: 04/10/24  Discharge Physician: Lebron JINNY Cage   PCP: Pcp, No   Recommendations at discharge:   Home with hospice  Discharge Diagnoses: Principal Problem:   GI bleed Active Problems:   Acute on chronic systolic CHF (congestive heart failure) (HCC)   CKD (chronic kidney disease) stage 4, GFR 15-29 ml/min (HCC)   Acute GI bleeding    Hospital Course: Joseph Tanner is a 73 y.o. male with history of chronic HFrEF, chronic kidney disease stage IV, BPH, hyperlipidemia, CAD was referred to the ER after patient's hemoglobin at the facility was found to be around 6.  Patient is a poor historian.  Denies any chest pain or shortness of breath.  Patient recently was admitted for CHF exacerbation had followed up with heart failure clinic 4 days ago and was to referred to hospice.  Patient states he has not noticed any blood in the stools. In the ER patient's hemoglobin is around 7.4 decreased from 8.7 from last month.  Stool for occult blood was positive.  GI consulted. Started on Protonix  IV, 1 unit of PRBC transfusion ordered after 80 mg IV Lasix  given.  Patient admitted for further management.     Today, patient denies any new complaints, denies any shortness of breath, chest pain, abdominal pain, nausea/vomiting, fever/chills, BRBPR/melena.  Had an extensive discussion with patient about the need to go to a facility with hospice due to unsuitable living situations, patient adamantly refused and insisting on discharging home with hospice.  Discussed extensively with patient's daughter Harlene on 9/12 and 9/13 as family is concerned about his living situation (lives alone, still using cocaine, noncompliant, unable to properly care for himself).  Patient is alert, oriented, and has capacity to make his own decisions.  Advised against illicit drug use.   Plan to discharge patient home with hospice.  High risk of readmission   Assessment and Plan:  Possible GI bleed Possible acute blood loss anemia/microcytic anemia/anemia from CKD Hemoglobin found to be around 6 at facility, 7.4 in ER which has decreased from 8.7 about a month ago FOBT positive Received 1 unit of PRBC on 9/9 GI consulted, poor candidate for any endoscopic intervention Continue Protonix    Chronic HFrEF Mildly elevated troponin, flat trend, chronic History of CAD Currently on room air, saturating well Last EF on 01/2024 showed 20 to 25%, global hypokinesis, grade 1 diastolic dysfunction, moderately elevated pulmonary artery systolic pressure Goal directed medical therapy limited due to hypotension and renal failure Declined ICD in the past Continue torsemide , decrease dose to 20 mg as worsening renal function noted, and advised to increase if short of breath.   Hospice to monitor closely and can increase dose for symptom relief   Hypokalemia Replaced as needed   Hypotension Continue midodrine    CKD stage IV Creatinine rising from baseline Reduce torsemide  for now   COPD Stable Continue inhalers   Hyperlipidemia Continue statins   History of cocaine abuse Still using cocaine, patient advised extensively   Goals of care discussion Patient is currently DNR Overall very poor prognosis, given end-stage heart failure, CKD, frequent hospitalization, and current cocaine use Home hospice         Consultants: GI Procedures performed: None Disposition: Hospice care Diet recommendation:  Cardiac diet    DISCHARGE MEDICATION: Allergies as of 04/10/2024   No Known Allergies  Medication List     TAKE these medications    acetaminophen  325 MG tablet Commonly known as: TYLENOL  Take 2 tablets (650 mg total) by mouth every 6 (six) hours as needed for mild pain (pain score 1-3) or fever (or Fever >/= 101).   albuterol  108 (90 Base) MCG/ACT  inhaler Commonly known as: VENTOLIN  HFA Inhale 2 puffs into the lungs every 6 (six) hours as needed for wheezing or shortness of breath.   atorvastatin  40 MG tablet Commonly known as: LIPITOR Take 1 tablet (40 mg total) by mouth every evening.   feeding supplement Liqd Take 237 mLs by mouth 3 (three) times daily between meals.   folic acid  1 MG tablet Commonly known as: FOLVITE  Take 1 tablet (1 mg total) by mouth daily.   melatonin 5 MG Tabs Take 1 tablet (5 mg total) by mouth at bedtime as needed (insomnia).   midodrine  5 MG tablet Commonly known as: PROAMATINE  Take 1 tablet (5 mg total) by mouth 3 (three) times daily with meals.   multivitamin with minerals Tabs tablet Take 1 tablet by mouth daily.   pantoprazole  40 MG tablet Commonly known as: PROTONIX  Take 1 tablet (40 mg total) by mouth 2 (two) times daily before a meal.   tamsulosin  0.4 MG Caps capsule Commonly known as: FLOMAX  Take 1 capsule (0.4 mg total) by mouth daily after breakfast.   thiamine  100 MG tablet Commonly known as: Vitamin B-1 Take 1 tablet (100 mg total) by mouth daily.   tiotropium 18 MCG inhalation capsule Commonly known as: SPIRIVA  Place 18 mcg into inhaler and inhale daily.   Torsemide  40 MG Tabs Take 20 mg by mouth daily. If you feel more short of breath, or swelling take 40 mg (1 tablet) What changed:  how much to take when to take this additional instructions   traZODone  50 MG tablet Commonly known as: DESYREL  Take 50 mg by mouth at bedtime.   umeclidinium bromide  62.5 MCG/ACT Aepb Commonly known as: INCRUSE ELLIPTA  Inhale 1 puff into the lungs daily.        Follow-up Information     [facility] Follow up.   Specialty: Hospice and Palliative Medicine Why: hospice at [facility] information: [address] [place]  [address] [phone]               Discharge Exam: Joseph Tanner   04/06/24 1647  Weight: 77.1 kg   General: NAD,  cachectic Cardiovascular: S1, S2 present Respiratory: Diminished breath sounds bilaterally Abdomen: Soft, nontender, nondistended, bowel sounds present Musculoskeletal: No bilateral pedal edema noted Skin: Normal Psychiatry: Normal mood   Condition at discharge: Fair  The results of significant diagnostics from this hospitalization (including imaging, microbiology, ancillary and laboratory) are listed below for reference.   Imaging Studies: DG Chest Portable 1 View Result Date: 04/06/2024 CLINICAL DATA:  Shortness of breath. EXAM: PORTABLE CHEST 1 VIEW COMPARISON:  Chest radiograph dated 03/17/2024 FINDINGS: Evaluation is limited due to positioning and exclusion of portion of the left lung from the image. There is mild cardiomegaly with mild vascular congestion and edema. Pneumonia is not excluded. No pleural effusion or pneumothorax identified. Bilateral hilar prominence suggestive of pulmonary hypertension. No acute osseous pathology. IMPRESSION: 1. Cardiomegaly with vascular congestion and mild edema. Pneumonia is not excluded. 2. Pulmonary hypertension. Electronically Signed   By: Vanetta Chou M.D.   On: 04/06/2024 20:11   DG Chest Portable 1 View Result Date: 03/17/2024 CLINICAL DATA:  dyspnea. EXAM: PORTABLE CHEST 1  VIEW COMPARISON:  02/10/2024. FINDINGS: Mild-to-moderate diffuse pulmonary vascular congestion with basilar gradient. There are bilateral small to trace pleural effusions. Probable atelectatic changes at the lung bases. Bilateral lung fields are clear. No dense consolidation or lung collapse. No pneumothorax. Stable mildly enlarged cardio-mediastinal silhouette. No acute osseous abnormalities. The soft tissues are within normal limits. IMPRESSION: Findings favor congestive heart failure/pulmonary edema. Electronically Signed   By: Ree Molt M.D.   On: 03/17/2024 17:36    Microbiology: Results for orders placed or performed during the hospital encounter of 03/17/24   Urine Culture (for pregnant, neutropenic or urologic patients or patients with an indwelling urinary catheter)     Status: Abnormal   Collection Time: 03/17/24  2:46 PM   Specimen: Urine, Clean Catch  Result Value Ref Range Status   Specimen Description URINE, CLEAN CATCH  Final   Special Requests   Final    NONE Performed at [facility], [address] [place], [place] [address]    Culture >=100,000 COLONIES/mL ENTEROCOCCUS FAECALIS (A)  Final   Report Status 03/20/2024 FINAL  Final   Organism ID, Bacteria ENTEROCOCCUS FAECALIS (A)  Final      Susceptibility   Enterococcus faecalis - MIC*    AMPICILLIN <=2 SENSITIVE Sensitive     NITROFURANTOIN <=16 SENSITIVE Sensitive     VANCOMYCIN 1 SENSITIVE Sensitive     * >=100,000 COLONIES/mL ENTEROCOCCUS FAECALIS    Labs: CBC: Recent Labs  Lab 04/06/24 1710 04/07/24 0400 04/08/24 0727 04/09/24 0419 04/10/24 0218  WBC 4.3 4.6  --   --   --   NEUTROABS 2.7 2.8  --   --   --   HGB 7.4* 8.2* 8.3* 7.9* 7.8*  HCT 25.4* 26.8* 27.6* 26.0* 25.7*  MCV 77.9* 77.5*  --   --   --   PLT 203 177  --   --   --    Basic Metabolic Panel: Recent Labs  Lab 04/06/24 1710 04/07/24 0400 04/08/24 0727 04/09/24 0419 04/10/24 0218  NA 138 139 136 134* 131*  K 3.6 3.8 3.5 3.4* 3.8  CL 98 101 99 97* 97*  CO2 25 22 23 22  20*  GLUCOSE 104* 100* 95 97 96  BUN 75* 77* 71* 74* 81*  CREATININE 2.66* 2.49* 2.68* 2.59* 2.82*  CALCIUM  9.0 8.8* 8.8* 8.5* 8.7*  MG 2.2 2.0  --  1.8  --    Liver Function Tests: Recent Labs  Lab 04/06/24 1932 04/07/24 0400  AST 58* 50*  ALT 25 23  ALKPHOS 50 52  BILITOT 1.9* 2.2*  PROT 7.6 7.2  ALBUMIN 2.8* 2.8*   CBG: No results for input(s): GLUCAP in the last 168 hours.  Discharge time spent: less than 30 minutes.  Signed: Lebron JINNY Cage, MD [facility] 04/10/2024

## 2024-04-10 NOTE — Progress Notes (Signed)
 Patient refused heparin  at 1330, notified MD. Educated patient on the risks and benefits of the medication. Patient being discharged today.

## 2024-04-10 NOTE — TOC CM/SW Note (Signed)
 Pt has been DC. Met with pt to discuss the DC plan. He reports that he lives with his daughter and he plans to return home with her support. He needs a BSC. Notified Nat Babe, RN, [facility] that pt has been DC. Nat reports that she will order the DME.

## 2024-04-13 ENCOUNTER — Telehealth: Payer: Self-pay | Admitting: Cardiovascular Disease

## 2024-04-13 NOTE — Telephone Encounter (Signed)
 Calling because patient wants Dr. Raford hospice attending physician. Please advise

## 2024-04-14 NOTE — Telephone Encounter (Signed)
 Advised Star Dr Raford prefers Hospice physician to manage patient

## 2024-04-23 ENCOUNTER — Ambulatory Visit: Payer: Self-pay | Admitting: Nurse Practitioner

## 2024-06-14 ENCOUNTER — Emergency Department (HOSPITAL_COMMUNITY)

## 2024-06-14 ENCOUNTER — Inpatient Hospital Stay (HOSPITAL_COMMUNITY)
Admission: EM | Admit: 2024-06-14 | Discharge: 2024-06-28 | DRG: 871 | Disposition: E | Attending: Emergency Medicine | Admitting: Emergency Medicine

## 2024-06-14 ENCOUNTER — Encounter (HOSPITAL_COMMUNITY): Payer: Self-pay | Admitting: Emergency Medicine

## 2024-06-14 ENCOUNTER — Other Ambulatory Visit: Payer: Self-pay

## 2024-06-14 DIAGNOSIS — R0902 Hypoxemia: Secondary | ICD-10-CM | POA: Diagnosis not present

## 2024-06-14 DIAGNOSIS — I5042 Chronic combined systolic (congestive) and diastolic (congestive) heart failure: Secondary | ICD-10-CM | POA: Diagnosis present

## 2024-06-14 DIAGNOSIS — K7200 Acute and subacute hepatic failure without coma: Secondary | ICD-10-CM | POA: Diagnosis present

## 2024-06-14 DIAGNOSIS — Z515 Encounter for palliative care: Secondary | ICD-10-CM

## 2024-06-14 DIAGNOSIS — E872 Acidosis, unspecified: Secondary | ICD-10-CM | POA: Diagnosis present

## 2024-06-14 DIAGNOSIS — Z66 Do not resuscitate: Secondary | ICD-10-CM | POA: Diagnosis present

## 2024-06-14 DIAGNOSIS — E7800 Pure hypercholesterolemia, unspecified: Secondary | ICD-10-CM | POA: Diagnosis present

## 2024-06-14 DIAGNOSIS — N184 Chronic kidney disease, stage 4 (severe): Secondary | ICD-10-CM | POA: Diagnosis present

## 2024-06-14 DIAGNOSIS — R0689 Other abnormalities of breathing: Secondary | ICD-10-CM | POA: Diagnosis not present

## 2024-06-14 DIAGNOSIS — R6520 Severe sepsis without septic shock: Secondary | ICD-10-CM | POA: Diagnosis present

## 2024-06-14 DIAGNOSIS — E860 Dehydration: Secondary | ICD-10-CM | POA: Diagnosis present

## 2024-06-14 DIAGNOSIS — N400 Enlarged prostate without lower urinary tract symptoms: Secondary | ICD-10-CM | POA: Diagnosis present

## 2024-06-14 DIAGNOSIS — E875 Hyperkalemia: Secondary | ICD-10-CM | POA: Diagnosis present

## 2024-06-14 DIAGNOSIS — I2510 Atherosclerotic heart disease of native coronary artery without angina pectoris: Secondary | ICD-10-CM | POA: Diagnosis present

## 2024-06-14 DIAGNOSIS — I10 Essential (primary) hypertension: Secondary | ICD-10-CM | POA: Diagnosis not present

## 2024-06-14 DIAGNOSIS — A419 Sepsis, unspecified organism: Secondary | ICD-10-CM | POA: Diagnosis not present

## 2024-06-14 DIAGNOSIS — E162 Hypoglycemia, unspecified: Secondary | ICD-10-CM | POA: Diagnosis present

## 2024-06-14 DIAGNOSIS — J690 Pneumonitis due to inhalation of food and vomit: Secondary | ICD-10-CM | POA: Diagnosis present

## 2024-06-14 DIAGNOSIS — R32 Unspecified urinary incontinence: Secondary | ICD-10-CM | POA: Diagnosis not present

## 2024-06-14 DIAGNOSIS — R54 Age-related physical debility: Secondary | ICD-10-CM | POA: Diagnosis present

## 2024-06-14 DIAGNOSIS — N179 Acute kidney failure, unspecified: Secondary | ICD-10-CM | POA: Diagnosis present

## 2024-06-14 DIAGNOSIS — Z1152 Encounter for screening for COVID-19: Secondary | ICD-10-CM

## 2024-06-14 DIAGNOSIS — E43 Unspecified severe protein-calorie malnutrition: Secondary | ICD-10-CM | POA: Diagnosis present

## 2024-06-14 DIAGNOSIS — R410 Disorientation, unspecified: Secondary | ICD-10-CM

## 2024-06-14 DIAGNOSIS — I130 Hypertensive heart and chronic kidney disease with heart failure and stage 1 through stage 4 chronic kidney disease, or unspecified chronic kidney disease: Secondary | ICD-10-CM | POA: Diagnosis present

## 2024-06-14 DIAGNOSIS — J189 Pneumonia, unspecified organism: Principal | ICD-10-CM | POA: Diagnosis present

## 2024-06-14 DIAGNOSIS — Z602 Problems related to living alone: Secondary | ICD-10-CM | POA: Diagnosis present

## 2024-06-14 DIAGNOSIS — Z681 Body mass index (BMI) 19 or less, adult: Secondary | ICD-10-CM

## 2024-06-14 DIAGNOSIS — J440 Chronic obstructive pulmonary disease with acute lower respiratory infection: Secondary | ICD-10-CM | POA: Diagnosis present

## 2024-06-14 DIAGNOSIS — I252 Old myocardial infarction: Secondary | ICD-10-CM

## 2024-06-14 DIAGNOSIS — Z79899 Other long term (current) drug therapy: Secondary | ICD-10-CM

## 2024-06-14 DIAGNOSIS — F17210 Nicotine dependence, cigarettes, uncomplicated: Secondary | ICD-10-CM | POA: Diagnosis present

## 2024-06-14 DIAGNOSIS — R680 Hypothermia, not associated with low environmental temperature: Secondary | ICD-10-CM | POA: Diagnosis present

## 2024-06-14 DIAGNOSIS — R000 Tachycardia, unspecified: Secondary | ICD-10-CM | POA: Diagnosis not present

## 2024-06-14 DIAGNOSIS — I959 Hypotension, unspecified: Secondary | ICD-10-CM | POA: Diagnosis present

## 2024-06-14 DIAGNOSIS — R64 Cachexia: Secondary | ICD-10-CM | POA: Diagnosis present

## 2024-06-14 DIAGNOSIS — D631 Anemia in chronic kidney disease: Secondary | ICD-10-CM | POA: Diagnosis present

## 2024-06-14 DIAGNOSIS — Z8249 Family history of ischemic heart disease and other diseases of the circulatory system: Secondary | ICD-10-CM

## 2024-06-14 DIAGNOSIS — K920 Hematemesis: Secondary | ICD-10-CM | POA: Diagnosis present

## 2024-06-14 DIAGNOSIS — G9341 Metabolic encephalopathy: Secondary | ICD-10-CM | POA: Diagnosis present

## 2024-06-14 LAB — URINALYSIS, W/ REFLEX TO CULTURE (INFECTION SUSPECTED)
Bilirubin Urine: NEGATIVE
Glucose, UA: NEGATIVE mg/dL
Ketones, ur: NEGATIVE mg/dL
Nitrite: NEGATIVE
Protein, ur: 100 mg/dL — AB
Specific Gravity, Urine: 1.012 (ref 1.005–1.030)
WBC, UA: >50 WBC/hpf (ref 0–5)
pH: 5 (ref 5.0–8.0)

## 2024-06-14 LAB — CBG MONITORING, ED
Glucose-Capillary: 72 mg/dL (ref 70–99)
Glucose-Capillary: 130 mg/dL — ABNORMAL HIGH (ref 70–99)

## 2024-06-14 LAB — CBC WITH DIFFERENTIAL/PLATELET
Abs Immature Granulocytes: 0.18 K/uL — ABNORMAL HIGH (ref 0.00–0.07)
Basophils Absolute: 0 K/uL (ref 0.0–0.1)
Basophils Relative: 0 %
Eosinophils Absolute: 0 K/uL (ref 0.0–0.5)
Eosinophils Relative: 0 %
HCT: 29.4 % — ABNORMAL LOW (ref 39.0–52.0)
Hemoglobin: 8.7 g/dL — ABNORMAL LOW (ref 13.0–17.0)
Immature Granulocytes: 1 %
Lymphocytes Relative: 2 %
Lymphs Abs: 0.3 K/uL — ABNORMAL LOW (ref 0.7–4.0)
MCH: 19 pg — ABNORMAL LOW (ref 26.0–34.0)
MCHC: 29.6 g/dL — ABNORMAL LOW (ref 30.0–36.0)
MCV: 64.3 fL — ABNORMAL LOW (ref 80.0–100.0)
Monocytes Absolute: 0.6 K/uL (ref 0.1–1.0)
Monocytes Relative: 4 %
Neutro Abs: 13.2 K/uL — ABNORMAL HIGH (ref 1.7–7.7)
Neutrophils Relative %: 93 %
Platelets: 97 K/uL — ABNORMAL LOW (ref 150–400)
RBC: 4.57 MIL/uL (ref 4.22–5.81)
RDW: 25.9 % — ABNORMAL HIGH (ref 11.5–15.5)
Smear Review: NORMAL
WBC: 14.3 K/uL — ABNORMAL HIGH (ref 4.0–10.5)
nRBC: 0.5 % — ABNORMAL HIGH (ref 0.0–0.2)

## 2024-06-14 LAB — COMPREHENSIVE METABOLIC PANEL WITH GFR
ALT: 86 U/L — ABNORMAL HIGH (ref 0–44)
AST: 242 U/L — ABNORMAL HIGH (ref 15–41)
Albumin: 2.4 g/dL — ABNORMAL LOW (ref 3.5–5.0)
Alkaline Phosphatase: 54 U/L (ref 38–126)
Anion gap: 15 (ref 5–15)
BUN: 87 mg/dL — ABNORMAL HIGH (ref 8–23)
CO2: 17 mmol/L — ABNORMAL LOW (ref 22–32)
Calcium: 8.4 mg/dL — ABNORMAL LOW (ref 8.9–10.3)
Chloride: 111 mmol/L (ref 98–111)
Creatinine, Ser: 3.42 mg/dL — ABNORMAL HIGH (ref 0.61–1.24)
GFR, Estimated: 18 mL/min — ABNORMAL LOW (ref >60)
Glucose, Bld: 52 mg/dL — ABNORMAL LOW (ref 70–99)
Potassium: 6.6 mmol/L (ref 3.5–5.1)
Sodium: 143 mmol/L (ref 135–145)
Total Bilirubin: 6.2 mg/dL — ABNORMAL HIGH (ref 0.0–1.2)
Total Protein: 6.2 g/dL — ABNORMAL LOW (ref 6.5–8.1)

## 2024-06-14 LAB — I-STAT CHEM 8, ED
BUN: 75 mg/dL — ABNORMAL HIGH (ref 8–23)
Calcium, Ion: 1.04 mmol/L — ABNORMAL LOW (ref 1.15–1.40)
Chloride: 114 mmol/L — ABNORMAL HIGH (ref 98–111)
Creatinine, Ser: 3.3 mg/dL — ABNORMAL HIGH (ref 0.61–1.24)
Glucose, Bld: 122 mg/dL — ABNORMAL HIGH (ref 70–99)
HCT: 27 % — ABNORMAL LOW (ref 39.0–52.0)
Hemoglobin: 9.2 g/dL — ABNORMAL LOW (ref 13.0–17.0)
Potassium: 5.6 mmol/L — ABNORMAL HIGH (ref 3.5–5.1)
Sodium: 143 mmol/L (ref 135–145)
TCO2: 15 mmol/L — ABNORMAL LOW (ref 22–32)

## 2024-06-14 LAB — I-STAT VENOUS BLOOD GAS, ED
Acid-base deficit: 6 mmol/L — ABNORMAL HIGH (ref 0.0–2.0)
Bicarbonate: 17.6 mmol/L — ABNORMAL LOW (ref 20.0–28.0)
Calcium, Ion: 1 mmol/L — ABNORMAL LOW (ref 1.15–1.40)
HCT: 32 % — ABNORMAL LOW (ref 39.0–52.0)
Hemoglobin: 10.9 g/dL — ABNORMAL LOW (ref 13.0–17.0)
O2 Saturation: 98 %
Potassium: 6.7 mmol/L (ref 3.5–5.1)
Sodium: 143 mmol/L (ref 135–145)
TCO2: 19 mmol/L — ABNORMAL LOW (ref 22–32)
pCO2, Ven: 29.7 mmHg — ABNORMAL LOW (ref 44–60)
pH, Ven: 7.381 (ref 7.25–7.43)
pO2, Ven: 99 mmHg — ABNORMAL HIGH (ref 32–45)

## 2024-06-14 LAB — PROTIME-INR
INR: 2 — ABNORMAL HIGH (ref 0.8–1.2)
Prothrombin Time: 23.6 s — ABNORMAL HIGH (ref 11.4–15.2)

## 2024-06-14 LAB — TSH: TSH: 1.101 u[IU]/mL (ref 0.350–4.500)

## 2024-06-14 LAB — LIPASE, BLOOD: Lipase: 434 U/L — ABNORMAL HIGH (ref 11–51)

## 2024-06-14 LAB — RAPID URINE DRUG SCREEN, HOSP PERFORMED
Amphetamines: NOT DETECTED
Barbiturates: NOT DETECTED
Benzodiazepines: POSITIVE — AB
Cocaine: POSITIVE — AB
Opiates: NOT DETECTED
Tetrahydrocannabinol: NOT DETECTED

## 2024-06-14 LAB — I-STAT CG4 LACTIC ACID, ED
Lactic Acid, Venous: 5.1 mmol/L (ref 0.5–1.9)
Lactic Acid, Venous: 5.9 mmol/L (ref 0.5–1.9)

## 2024-06-14 LAB — BRAIN NATRIURETIC PEPTIDE: B Natriuretic Peptide: 777.5 pg/mL — ABNORMAL HIGH (ref 0.0–100.0)

## 2024-06-14 LAB — CK: Total CK: 374 U/L (ref 49–397)

## 2024-06-14 MED ORDER — DEXTROSE 50 % IV SOLN
1.0000 | Freq: Once | INTRAVENOUS | Status: AC
  Administered 2024-06-14: 50 mL via INTRAVENOUS
  Filled 2024-06-14: qty 50

## 2024-06-14 MED ORDER — METRONIDAZOLE 500 MG/100ML IV SOLN
500.0000 mg | Freq: Once | INTRAVENOUS | Status: AC
  Administered 2024-06-14: 500 mg via INTRAVENOUS
  Filled 2024-06-14: qty 100

## 2024-06-14 MED ORDER — LACTATED RINGERS IV BOLUS (SEPSIS): 500.0000 mL | Freq: Once | INTRAVENOUS | Status: DC

## 2024-06-14 MED ORDER — LACTATED RINGERS IV BOLUS (SEPSIS): 1000.0000 mL | Freq: Once | INTRAVENOUS | Status: DC

## 2024-06-14 MED ORDER — CALCIUM GLUCONATE-NACL 1-0.675 GM/50ML-% IV SOLN
1.0000 g | Freq: Once | INTRAVENOUS | Status: AC
  Administered 2024-06-14: 1000 mg via INTRAVENOUS
  Filled 2024-06-14: qty 50

## 2024-06-14 MED ORDER — CHLORHEXIDINE GLUCONATE CLOTH 2 % EX PADS
6.0000 | MEDICATED_PAD | Freq: Every day | CUTANEOUS | Status: DC
  Administered 2024-06-15 – 2024-06-16 (×2): 6 via TOPICAL

## 2024-06-14 MED ORDER — INSULIN ASPART 100 UNIT/ML IV SOLN
5.0000 [IU] | Freq: Once | INTRAVENOUS | Status: AC
  Administered 2024-06-14: 5 [IU] via INTRAVENOUS
  Filled 2024-06-14: qty 5

## 2024-06-14 MED ORDER — ALBUTEROL SULFATE (2.5 MG/3ML) 0.083% IN NEBU
10.0000 mg | INHALATION_SOLUTION | Freq: Once | RESPIRATORY_TRACT | Status: AC
  Administered 2024-06-14: 10 mg via RESPIRATORY_TRACT
  Filled 2024-06-14: qty 12

## 2024-06-14 MED ORDER — SODIUM ZIRCONIUM CYCLOSILICATE 10 G PO PACK
10.0000 g | PACK | Freq: Once | ORAL | Status: AC
  Administered 2024-06-14: 10 g via ORAL
  Filled 2024-06-14: qty 1

## 2024-06-14 MED ORDER — LACTATED RINGERS IV BOLUS (SEPSIS)
1000.0000 mL | Freq: Once | INTRAVENOUS | Status: AC
  Administered 2024-06-14: 1000 mL via INTRAVENOUS

## 2024-06-14 MED ORDER — LACTATED RINGERS IV SOLN: INTRAVENOUS | Status: DC

## 2024-06-14 MED ORDER — VANCOMYCIN HCL IN DEXTROSE 1-5 GM/200ML-% IV SOLN
1000.0000 mg | Freq: Once | INTRAVENOUS | Status: AC
  Administered 2024-06-14: 1000 mg via INTRAVENOUS
  Filled 2024-06-14: qty 200

## 2024-06-14 MED ORDER — SODIUM CHLORIDE 0.9 % IV SOLN
2.0000 g | Freq: Once | INTRAVENOUS | Status: AC
  Administered 2024-06-14: 2 g via INTRAVENOUS
  Filled 2024-06-14: qty 12.5

## 2024-06-14 NOTE — ED Notes (Signed)
 Bair hugger placed on pt for rectal temp of 88.6.

## 2024-06-14 NOTE — ED Notes (Signed)
 First set of cultures obtained prior to Abx admin. Cultures obtained from rt arm at @ 2030.

## 2024-06-14 NOTE — ED Triage Notes (Signed)
 BIB [facility] from home after family came to check on him today. Family last saw him well last Wednesday 06/09/24. PT was found by family altered and on the couch.   BP 88/pal CBG 96 HR 80-100 Temp 96.4 Temporal 20 g Lt hand

## 2024-06-14 NOTE — ED Provider Notes (Signed)
 [facility] AT [facility] Provider Note   CSN: [id number] Arrival date & time: 06/14/24  8063     Patient presents with: No chief complaint on file.   Joseph Tanner is a 73 y.o. male.  {Add pertinent medical, surgical, social history, OB history to HPI:32947} Hx of chronic HFrEF, chronic kidney disease stage IV, BPH, hyperlipidemia, CAD p/w AMS, c/f sepsis from facility. Pt of note is currently on hospice.  Per patient family report, patient recently was discharged from inpatient hospice and has been living by himself at home.  He has a hospice nurse who visits once a week.  His family then visits him other times.  Patient family endorses they last saw him on Wednesday of last week, he overall was doing at his baseline, no reported injuries.  His family has not heard from him since then, hospice nurse was supposed to evaluate him today however the patient did not answer the door and therefore they called the family, and then left the patient.  Family went to evaluate the patient, and found him sitting on the couch, very altered not able to answer questions, covered in stool and urine.  They are concerned about his status, therefore called EMS.  Patient was hypothermic to 63 F with EMS, hypotensive with systolics in the 80s.  On arrival patient is able to answer who he is, where he is, and what month it is, however is not able to answer any other questions.  He shakes his head no when asked if he has any pain.  History is otherwise limited.  Per discussion with patient family, per the patient's wishes he would not want chest compressions, cardioversion, defibrillation, or intubation.  However, any further medical workup that could evaluate his current symptoms they would want the patient to be fully evaluated, including receiving imaging, lab workup, and fluid resuscitation.  They would also want the patient to be admitted to the hospital as he lives alone,  and do not feel that patient would be able to take care of himself at home.  They are looking to change his current hospice situation as they feel that he has become significantly weaker over the past few weeks, however has been usually able to take good care of himself at home including making himself his own food, laundry, and overall ADLs.        Prior to Admission medications   Medication Sig Start Date End Date Taking? Authorizing Provider  acetaminophen  (TYLENOL ) 325 MG tablet Take 2 tablets (650 mg total) by mouth every 6 (six) hours as needed for mild pain (pain score 1-3) or fever (or Fever >/= 101). 02/20/24   Gonfa, Mignon DASEN, MD  albuterol  (VENTOLIN  HFA) 108 (90 Base) MCG/ACT inhaler Inhale 2 puffs into the lungs every 6 (six) hours as needed for wheezing or shortness of breath.    [provider]  atorvastatin  (LIPITOR) 40 MG tablet Take 1 tablet (40 mg total) by mouth every evening. 01/29/23   Lucien Orren SAILOR, PA-C  feeding supplement (ENSURE PLUS HIGH PROTEIN) LIQD Take 237 mLs by mouth 3 (three) times daily between meals. 02/20/24   Gonfa, Taye T, MD  folic acid  (FOLVITE ) 1 MG tablet Take 1 tablet (1 mg total) by mouth daily. 02/21/24   Gonfa, Taye T, MD  melatonin 5 MG TABS Take 1 tablet (5 mg total) by mouth at bedtime as needed (insomnia). 02/20/24   Gonfa, Taye T, MD  midodrine  (PROAMATINE ) 5  MG tablet Take 1 tablet (5 mg total) by mouth 3 (three) times daily with meals. 03/30/24   Fairy Frames, MD  Multiple Vitamin (MULTIVITAMIN WITH MINERALS) TABS tablet Take 1 tablet by mouth daily. 11/25/20   Jadine Toribio SQUIBB, MD  pantoprazole  (PROTONIX ) 40 MG tablet Take 1 tablet (40 mg total) by mouth 2 (two) times daily before a meal. 04/10/24 05/10/24  Ezenduka, Nkeiruka J, MD  tamsulosin  (FLOMAX ) 0.4 MG CAPS capsule Take 1 capsule (0.4 mg total) by mouth daily after breakfast. 02/17/24   Trixie Nilda HERO, MD  thiamine  (VITAMIN B-1) 100 MG tablet Take 1 tablet (100 mg total) by mouth  daily. 02/21/24   Gonfa, Taye T, MD  tiotropium (SPIRIVA ) 18 MCG inhalation capsule Place 18 mcg into inhaler and inhale daily.    [provider]  Torsemide  40 MG TABS Take 20 mg by mouth daily. If you feel more short of breath, or swelling take 40 mg (1 tablet) 04/10/24   Ezenduka, Nkeiruka J, MD  traZODone  (DESYREL ) 50 MG tablet Take 50 mg by mouth at bedtime.    [provider]  umeclidinium bromide  (INCRUSE ELLIPTA ) 62.5 MCG/ACT AEPB Inhale 1 puff into the lungs daily.    [provider]    Allergies: Patient has no known allergies.    Review of Systems  Updated Vital Signs There were no vitals taken for this visit.  Physical Exam Vitals reviewed.  Constitutional:      General: He is in acute distress.     Appearance: He is ill-appearing.  HENT:     Head: Normocephalic and atraumatic.     Mouth/Throat:     Mouth: Mucous membranes are dry.  Eyes:     Conjunctiva/sclera: Conjunctivae normal.     Pupils: Pupils are equal, round, and reactive to light.  Cardiovascular:     Rate and Rhythm: Normal rate and regular rhythm.     Pulses: Normal pulses.     Heart sounds: Normal heart sounds. No murmur heard.    No gallop.  Pulmonary:     Effort: Pulmonary effort is normal. No respiratory distress.     Breath sounds: Normal breath sounds. No stridor. No wheezing, rhonchi or rales.  Abdominal:     General: Abdomen is flat. There is no distension.     Palpations: Abdomen is soft.     Tenderness: There is abdominal tenderness (LLQ). There is left CVA tenderness. There is no right CVA tenderness, guarding or rebound.  Musculoskeletal:     Cervical back: Neck supple. No rigidity.  Skin:    General: Skin is warm.     Capillary Refill: Capillary refill takes more than 3 seconds.  Neurological:     Mental Status: He is alert and oriented to person, place, and time.     Cranial Nerves: No cranial nerve deficit.     Motor: Weakness (Slightly weaker R compared to  L) present.     (all labs ordered are listed, but only abnormal results are displayed) Labs Reviewed - No data to display  EKG: None  Radiology: No results found.  {Document cardiac monitor, telemetry assessment procedure when appropriate:32947} Procedures   Medications Ordered in the ED - No data to display    {Click here for ABCD2, HEART and other calculators REFRESH Note before signing:1}                              Medical Decision  Making Amount and/or Complexity of Data Reviewed Labs: ordered. Radiology: ordered.  Risk OTC drugs. Prescription drug management.   ***  {Document critical care time when appropriate  Document review of labs and clinical decision tools ie CHADS2VASC2, etc  Document your independent review of radiology images and any outside records  Document your discussion with family members, caretakers and with consultants  Document social determinants of health affecting pt's care  Document your decision making why or why not admission, treatments were needed:32947:::1}   Final diagnoses:  None    ED Discharge Orders     None

## 2024-06-14 NOTE — Sepsis Progress Note (Signed)
 Elink following for sepsis protocol.

## 2024-06-14 NOTE — ED Provider Notes (Signed)
 Care of patient received from prior provider at 11:24 PM, please see their note for complete H/P and care plan.  Received handoff per ED course.  Clinical Course as of 06/14/24 2324  Mon Jun 14, 2024  2304 BP improved w/ fluid resus  [BS]  2304 AMS/Sepsis  [CC]    Clinical Course User Index [BS] Arlee Katz, MD [CC] Jerral Meth, MD    Reassessment: ***

## 2024-06-15 ENCOUNTER — Inpatient Hospital Stay (HOSPITAL_COMMUNITY)

## 2024-06-15 DIAGNOSIS — F17210 Nicotine dependence, cigarettes, uncomplicated: Secondary | ICD-10-CM | POA: Diagnosis present

## 2024-06-15 DIAGNOSIS — J189 Pneumonia, unspecified organism: Secondary | ICD-10-CM | POA: Diagnosis present

## 2024-06-15 DIAGNOSIS — I5042 Chronic combined systolic (congestive) and diastolic (congestive) heart failure: Secondary | ICD-10-CM | POA: Diagnosis present

## 2024-06-15 DIAGNOSIS — Z515 Encounter for palliative care: Secondary | ICD-10-CM | POA: Diagnosis not present

## 2024-06-15 DIAGNOSIS — A419 Sepsis, unspecified organism: Secondary | ICD-10-CM | POA: Diagnosis present

## 2024-06-15 DIAGNOSIS — K920 Hematemesis: Secondary | ICD-10-CM | POA: Diagnosis present

## 2024-06-15 DIAGNOSIS — R6520 Severe sepsis without septic shock: Secondary | ICD-10-CM

## 2024-06-15 DIAGNOSIS — E860 Dehydration: Secondary | ICD-10-CM | POA: Diagnosis present

## 2024-06-15 DIAGNOSIS — N179 Acute kidney failure, unspecified: Secondary | ICD-10-CM | POA: Diagnosis present

## 2024-06-15 DIAGNOSIS — N184 Chronic kidney disease, stage 4 (severe): Secondary | ICD-10-CM | POA: Diagnosis present

## 2024-06-15 DIAGNOSIS — Z66 Do not resuscitate: Secondary | ICD-10-CM | POA: Diagnosis present

## 2024-06-15 DIAGNOSIS — J690 Pneumonitis due to inhalation of food and vomit: Secondary | ICD-10-CM | POA: Diagnosis present

## 2024-06-15 DIAGNOSIS — E7800 Pure hypercholesterolemia, unspecified: Secondary | ICD-10-CM | POA: Diagnosis present

## 2024-06-15 DIAGNOSIS — I130 Hypertensive heart and chronic kidney disease with heart failure and stage 1 through stage 4 chronic kidney disease, or unspecified chronic kidney disease: Secondary | ICD-10-CM | POA: Diagnosis present

## 2024-06-15 DIAGNOSIS — Z681 Body mass index (BMI) 19 or less, adult: Secondary | ICD-10-CM | POA: Diagnosis not present

## 2024-06-15 DIAGNOSIS — E875 Hyperkalemia: Secondary | ICD-10-CM | POA: Diagnosis present

## 2024-06-15 DIAGNOSIS — D631 Anemia in chronic kidney disease: Secondary | ICD-10-CM | POA: Diagnosis present

## 2024-06-15 DIAGNOSIS — K7200 Acute and subacute hepatic failure without coma: Secondary | ICD-10-CM | POA: Diagnosis present

## 2024-06-15 DIAGNOSIS — Z1152 Encounter for screening for COVID-19: Secondary | ICD-10-CM | POA: Diagnosis not present

## 2024-06-15 DIAGNOSIS — E43 Unspecified severe protein-calorie malnutrition: Secondary | ICD-10-CM | POA: Diagnosis present

## 2024-06-15 DIAGNOSIS — E872 Acidosis, unspecified: Secondary | ICD-10-CM | POA: Diagnosis present

## 2024-06-15 DIAGNOSIS — G9341 Metabolic encephalopathy: Secondary | ICD-10-CM | POA: Diagnosis present

## 2024-06-15 DIAGNOSIS — R64 Cachexia: Secondary | ICD-10-CM | POA: Diagnosis present

## 2024-06-15 DIAGNOSIS — J440 Chronic obstructive pulmonary disease with acute lower respiratory infection: Secondary | ICD-10-CM | POA: Diagnosis present

## 2024-06-15 LAB — COMPREHENSIVE METABOLIC PANEL WITH GFR
ALT: 78 U/L — ABNORMAL HIGH (ref 0–44)
AST: 216 U/L — ABNORMAL HIGH (ref 15–41)
Albumin: 2.1 g/dL — ABNORMAL LOW (ref 3.5–5.0)
Alkaline Phosphatase: 50 U/L (ref 38–126)
Anion gap: 18 — ABNORMAL HIGH (ref 5–15)
BUN: 82 mg/dL — ABNORMAL HIGH (ref 8–23)
CO2: 13 mmol/L — ABNORMAL LOW (ref 22–32)
Calcium: 8.2 mg/dL — ABNORMAL LOW (ref 8.9–10.3)
Chloride: 112 mmol/L — ABNORMAL HIGH (ref 98–111)
Creatinine, Ser: 3.48 mg/dL — ABNORMAL HIGH (ref 0.61–1.24)
GFR, Estimated: 18 mL/min — ABNORMAL LOW (ref >60)
Glucose, Bld: 41 mg/dL — CL (ref 70–99)
Potassium: 5.2 mmol/L — ABNORMAL HIGH (ref 3.5–5.1)
Sodium: 143 mmol/L (ref 135–145)
Total Bilirubin: 6.2 mg/dL — ABNORMAL HIGH (ref 0.0–1.2)
Total Protein: 5.7 g/dL — ABNORMAL LOW (ref 6.5–8.1)

## 2024-06-15 LAB — CBC WITH DIFFERENTIAL/PLATELET
Abs Immature Granulocytes: 0.14 K/uL — ABNORMAL HIGH (ref 0.00–0.07)
Basophils Absolute: 0 K/uL (ref 0.0–0.1)
Basophils Relative: 0 %
Eosinophils Absolute: 0 K/uL (ref 0.0–0.5)
Eosinophils Relative: 0 %
HCT: 26.5 % — ABNORMAL LOW (ref 39.0–52.0)
Hemoglobin: 7.8 g/dL — ABNORMAL LOW (ref 13.0–17.0)
Immature Granulocytes: 1 %
Lymphocytes Relative: 2 %
Lymphs Abs: 0.3 K/uL — ABNORMAL LOW (ref 0.7–4.0)
MCH: 18.8 pg — ABNORMAL LOW (ref 26.0–34.0)
MCHC: 29.4 g/dL — ABNORMAL LOW (ref 30.0–36.0)
MCV: 63.9 fL — ABNORMAL LOW (ref 80.0–100.0)
Monocytes Absolute: 1 K/uL (ref 0.1–1.0)
Monocytes Relative: 6 %
Neutro Abs: 13.6 K/uL — ABNORMAL HIGH (ref 1.7–7.7)
Neutrophils Relative %: 91 %
Platelets: 90 K/uL — ABNORMAL LOW (ref 150–400)
RBC: 4.15 MIL/uL — ABNORMAL LOW (ref 4.22–5.81)
RDW: 25.7 % — ABNORMAL HIGH (ref 11.5–15.5)
Smear Review: NORMAL
WBC: 15 K/uL — ABNORMAL HIGH (ref 4.0–10.5)
nRBC: 0.5 % — ABNORMAL HIGH (ref 0.0–0.2)

## 2024-06-15 LAB — GLUCOSE, CAPILLARY
Glucose-Capillary: 88 mg/dL (ref 70–99)
Glucose-Capillary: 83 mg/dL (ref 70–99)
Glucose-Capillary: 88 mg/dL (ref 70–99)
Glucose-Capillary: 85 mg/dL (ref 70–99)

## 2024-06-15 LAB — BLOOD GAS, VENOUS
Acid-base deficit: 11.8 mmol/L — ABNORMAL HIGH (ref 0.0–2.0)
Bicarbonate: 12.1 mmol/L — ABNORMAL LOW (ref 20.0–28.0)
O2 Saturation: 96.3 %
Patient temperature: 36.2
pCO2, Ven: 22 mmHg — ABNORMAL LOW (ref 44–60)
pH, Ven: 7.34 (ref 7.25–7.43)
pO2, Ven: 83 mmHg — ABNORMAL HIGH (ref 32–45)

## 2024-06-15 LAB — LACTIC ACID, PLASMA
Lactic Acid, Venous: 6.4 mmol/L (ref 0.5–1.9)
Lactic Acid, Venous: 4.9 mmol/L (ref 0.5–1.9)
Lactic Acid, Venous: 5.8 mmol/L (ref 0.5–1.9)

## 2024-06-15 LAB — CBG MONITORING, ED
Glucose-Capillary: 26 mg/dL — CL (ref 70–99)
Glucose-Capillary: 83 mg/dL (ref 70–99)
Glucose-Capillary: 93 mg/dL (ref 70–99)
Glucose-Capillary: 84 mg/dL (ref 70–99)
Glucose-Capillary: 80 mg/dL (ref 70–99)
Glucose-Capillary: 84 mg/dL (ref 70–99)

## 2024-06-15 LAB — PHOSPHORUS: Phosphorus: 5.1 mg/dL — ABNORMAL HIGH (ref 2.5–4.6)

## 2024-06-15 LAB — RESP PANEL BY RT-PCR (RSV, FLU A&B, COVID)  RVPGX2
Influenza A by PCR: NEGATIVE
Influenza B by PCR: NEGATIVE
Resp Syncytial Virus by PCR: NEGATIVE
SARS Coronavirus 2 by RT PCR: NEGATIVE

## 2024-06-15 LAB — LIPASE, BLOOD: Lipase: 459 U/L — ABNORMAL HIGH (ref 11–51)

## 2024-06-15 LAB — MAGNESIUM: Magnesium: 1.8 mg/dL (ref 1.7–2.4)

## 2024-06-15 LAB — I-STAT CG4 LACTIC ACID, ED
Lactic Acid, Venous: 6.6 mmol/L (ref 0.5–1.9)
Lactic Acid, Venous: 7.5 mmol/L (ref 0.5–1.9)

## 2024-06-15 LAB — AMMONIA: Ammonia: 19 umol/L (ref 9–35)

## 2024-06-15 MED ORDER — METRONIDAZOLE 500 MG/100ML IV SOLN: 500.0000 mg | Freq: Two times a day (BID) | INTRAVENOUS | Status: DC

## 2024-06-15 MED ORDER — MIDODRINE HCL 5 MG PO TABS
10.0000 mg | ORAL_TABLET | ORAL | Status: AC
  Administered 2024-06-15: 10 mg via ORAL
  Filled 2024-06-15: qty 2

## 2024-06-15 MED ORDER — SODIUM CHLORIDE 0.9 % IV SOLN
2.0000 g | INTRAVENOUS | Status: DC
  Administered 2024-06-15: 2 g via INTRAVENOUS
  Filled 2024-06-15: qty 12.5

## 2024-06-15 MED ORDER — SODIUM CHLORIDE 0.9 % IV SOLN: 2.0000 g | INTRAVENOUS | Status: DC

## 2024-06-15 MED ORDER — ALBUMIN HUMAN 25 % IV SOLN
25.0000 g | Freq: Four times a day (QID) | INTRAVENOUS | Status: AC
  Administered 2024-06-15 (×2): 25 g via INTRAVENOUS
  Filled 2024-06-15 (×2): qty 100

## 2024-06-15 MED ORDER — HEPARIN SODIUM (PORCINE) 5000 UNIT/ML IJ SOLN
5000.0000 [IU] | Freq: Three times a day (TID) | INTRAMUSCULAR | Status: DC
  Administered 2024-06-15 – 2024-06-16 (×4): 5000 [IU] via SUBCUTANEOUS
  Filled 2024-06-15 (×4): qty 1

## 2024-06-15 MED ORDER — LINEZOLID 600 MG/300ML IV SOLN
600.0000 mg | Freq: Two times a day (BID) | INTRAVENOUS | Status: DC
  Filled 2024-06-15: qty 300

## 2024-06-15 MED ORDER — MELATONIN 5 MG PO TABS: 5.0000 mg | ORAL_TABLET | Freq: Every evening | ORAL | Status: DC | PRN

## 2024-06-15 MED ORDER — DEXTROSE 50 % IV SOLN
50.0000 mL | INTRAVENOUS | Status: DC | PRN
  Administered 2024-06-15: 50 mL via INTRAVENOUS
  Filled 2024-06-15: qty 50

## 2024-06-15 MED ORDER — PROCHLORPERAZINE EDISYLATE 10 MG/2ML IJ SOLN: 5.0000 mg | Freq: Four times a day (QID) | INTRAMUSCULAR | Status: DC | PRN

## 2024-06-15 MED ORDER — DEXTROSE 10 % IV SOLN: INTRAVENOUS | Status: DC

## 2024-06-15 MED ORDER — DEXTROSE 10 % IV SOLN: INTRAVENOUS | Status: AC

## 2024-06-15 MED ORDER — SODIUM BICARBONATE 8.4 % IV SOLN
50.0000 meq | INTRAVENOUS | Status: AC
  Administered 2024-06-15: 50 meq via INTRAVENOUS
  Filled 2024-06-15: qty 50

## 2024-06-15 MED ORDER — SODIUM ZIRCONIUM CYCLOSILICATE 10 G PO PACK
10.0000 g | PACK | Freq: Two times a day (BID) | ORAL | Status: DC
  Administered 2024-06-15: 10 g via ORAL
  Filled 2024-06-15: qty 1

## 2024-06-15 MED ORDER — ALBUMIN HUMAN 25 % IV SOLN
25.0000 g | Freq: Four times a day (QID) | INTRAVENOUS | Status: DC
  Administered 2024-06-15 (×2): 25 g via INTRAVENOUS
  Filled 2024-06-15 (×2): qty 100

## 2024-06-15 MED ORDER — SODIUM CHLORIDE 0.9 % IV SOLN: INTRAVENOUS | Status: DC

## 2024-06-15 MED ORDER — ACETAMINOPHEN 500 MG PO TABS: 500.0000 mg | ORAL_TABLET | Freq: Four times a day (QID) | ORAL | Status: DC | PRN

## 2024-06-15 MED ORDER — SODIUM CHLORIDE 0.9 % IV BOLUS
250.0000 mL | INTRAVENOUS | Status: AC
  Administered 2024-06-15: 250 mL via INTRAVENOUS

## 2024-06-15 MED ORDER — METRONIDAZOLE 500 MG/100ML IV SOLN
500.0000 mg | Freq: Two times a day (BID) | INTRAVENOUS | Status: DC
  Administered 2024-06-15 (×2): 500 mg via INTRAVENOUS
  Filled 2024-06-15 (×2): qty 100

## 2024-06-15 MED ORDER — LINEZOLID 600 MG/300ML IV SOLN
600.0000 mg | Freq: Two times a day (BID) | INTRAVENOUS | Status: DC
  Administered 2024-06-15: 600 mg via INTRAVENOUS
  Filled 2024-06-15 (×2): qty 300

## 2024-06-15 MED ORDER — MIDODRINE HCL 5 MG PO TABS
2.5000 mg | ORAL_TABLET | Freq: Three times a day (TID) | ORAL | Status: DC
  Filled 2024-06-15 (×2): qty 1

## 2024-06-15 MED ORDER — POLYETHYLENE GLYCOL 3350 17 G PO PACK: 17.0000 g | PACK | Freq: Every day | ORAL | Status: DC | PRN

## 2024-06-15 MED ORDER — ALBUMIN HUMAN 25 % IV SOLN
25.0000 g | Freq: Four times a day (QID) | INTRAVENOUS | Status: AC
Start: 2024-06-15 — End: 2024-06-16
  Administered 2024-06-16 (×2): 25 g via INTRAVENOUS
  Filled 2024-06-15 (×2): qty 100

## 2024-06-15 MED ORDER — LACTATED RINGERS IV BOLUS
1000.0000 mL | Freq: Once | INTRAVENOUS | Status: AC
  Administered 2024-06-15: 1000 mL via INTRAVENOUS

## 2024-06-15 NOTE — Plan of Care (Signed)
   Problem: Elimination: Goal: Will not experience complications related to bowel motility Outcome: Progressing   Problem: Pain Managment: Goal: General experience of comfort will improve and/or be controlled Outcome: Progressing

## 2024-06-15 NOTE — ED Notes (Signed)
 Core temp 96.2 Bair hugger restarted

## 2024-06-15 NOTE — H&P (Addendum)
 History and Physical  Joseph Tanner FMW:[id number] DOB: 08-20-1950 DOA: 06/14/2024  Referring physician: Dr. Jerral, EDP PCP: Pcp, No  Outpatient Specialists: Cardiology, hospice care. Patient coming from: Home.  Chief Complaint: Code sepsis.  HPI: Joseph Tanner is a 73 y.o. 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  The patient lives alone.  His family last saw him on 06/09/2024 and when they visited today he was noted to be altered while sitting on the couch.  Per his ex-wife via phone, the patient has been followed by hospice care.  For the past few days he has not allowed hospice RN into his house.  EMS was activated.  Upon EMS arrival, the patient was noted to be hypotensive and hypothermic.  Code sepsis was called.  On arrival to the ER the patient was hypothermic with rectal temperature of 88.6 F.  Peripheral blood cultures were obtained.  Chest x-ray revealed new patchy airspace disease in the left midlung concerning for pneumonia.  The patient was started on broad-spectrum IV antibiotics, cefepime, IV vancomycin, and IV Flagyl.  Lab studies were notable for hyperkalemia with serum potassium of 6.7, lactic acidosis, peaked at 7.5, hypoglycemia, elevated liver chemistries with T bilirubin of 6.2.  MRCP is pending.  Admitted by [facility], hospitalist service.  At the time of this visit the patient is somnolent but arousable.  Called his daughter multiple times with no answer.  Was able to reach his ex-wife Joseph Tanner.  She states decision-makers is her and his daughter Joseph Tanner.  She confirmed DNR/DNI status, no central line, no ICU transfer.  Continue to treat to treatable.  Palliative care consulted to assist with establishing goals of care.  ED Course: Temperature 96.7.  BP 106/67, pulse 90, respiration  rate 49, O2 saturation 100% on room air.  Review of Systems: Review of systems as noted in the HPI. All other systems reviewed and are negative.   Past Medical History:  Diagnosis Date   COPD (chronic obstructive pulmonary disease) (HCC)    Hyperlipemia    Hypertension    NSTEMI (non-ST elevated myocardial infarction) (HCC)    Pure hypercholesterolemia 03/15/2021   History reviewed. No pertinent surgical history.  Social History:  reports that he has been smoking cigarettes. He has never used smokeless tobacco. He reports that he does not currently use alcohol. He reports current drug use. Drug: Cocaine.   No Known Allergies  Family History  Problem Relation Age of Onset   Heart failure Mother    Heart failure Maternal Grandmother    Heart failure Maternal Grandfather    Heart failure Paternal Grandmother    Heart failure Paternal Grandfather       Prior to Admission medications   Medication Sig Start Date End Date Taking? Authorizing Provider  acetaminophen  (TYLENOL ) 325 MG tablet Take 2 tablets (650 mg total) by mouth every 6 (six) hours as needed for mild pain (pain score 1-3) or fever (or Fever >/= 101). 02/20/24   Gonfa, Mignon DASEN, MD  albuterol  (VENTOLIN  HFA) 108 (90 Base) MCG/ACT inhaler Inhale 2 puffs into the lungs every 6 (six) hours as needed for wheezing or shortness of breath.    [provider]  atorvastatin  (LIPITOR) 40 MG tablet Take 1 tablet (40 mg total) by mouth every evening. 01/29/23   Lucien Orren SAILOR, PA-C  feeding supplement (ENSURE PLUS HIGH PROTEIN) LIQD Take 237 mLs by mouth 3 (three) times daily between meals. 02/20/24   Gonfa, Taye T, MD  folic acid  (FOLVITE ) 1 MG tablet Take 1 tablet (1 mg total) by mouth daily. 02/21/24   Gonfa, Taye T, MD  melatonin 5 MG TABS Take 1 tablet (5 mg total) by mouth at bedtime as needed (insomnia). 02/20/24   Gonfa, Taye T, MD  midodrine  (PROAMATINE ) 5 MG tablet Take 1 tablet (5 mg total) by mouth 3 (three) times daily  with meals. 03/30/24   Fairy Frames, MD  Multiple Vitamin (MULTIVITAMIN WITH MINERALS) TABS tablet Take 1 tablet by mouth daily. 11/25/20   Jadine Toribio SQUIBB, MD  pantoprazole  (PROTONIX ) 40 MG tablet Take 1 tablet (40 mg total) by mouth 2 (two) times daily before a meal. 04/10/24 05/10/24  Ezenduka, Nkeiruka J, MD  tamsulosin  (FLOMAX ) 0.4 MG CAPS capsule Take 1 capsule (0.4 mg total) by mouth daily after breakfast. 02/17/24   Trixie Nilda HERO, MD  thiamine  (VITAMIN B-1) 100 MG tablet Take 1 tablet (100 mg total) by mouth daily. 02/21/24   Gonfa, Taye T, MD  tiotropium (SPIRIVA ) 18 MCG inhalation capsule Place 18 mcg into inhaler and inhale daily.    [provider]  Torsemide  40 MG TABS Take 20 mg by mouth daily. If you feel more short of breath, or swelling take 40 mg (1 tablet) 04/10/24   Ezenduka, Nkeiruka J, MD  traZODone  (DESYREL ) 50 MG tablet Take 50 mg by mouth at bedtime.    [provider]  umeclidinium bromide  (INCRUSE ELLIPTA ) 62.5 MCG/ACT AEPB Inhale 1 puff into the lungs daily.    [provider]    Physical Exam: BP 103/69   Pulse 96   Temp (!) 97 F (36.1 C)   Resp (!) 30   Ht 6' 3 (1.905 m)   Wt 54.4 kg   SpO2 100%   BMI 15.00 kg/m   General: 73 y.o. year-old male frail-appearing.  Somnolent.   Cardiovascular: Regular rate and rhythm with no rubs or gallops.  No thyromegaly or JVD noted.  No lower extremity edema bilaterally. Respiratory: Rales at bases with poor inspiratory effort. Abdomen: Right upper quadrant tenderness with palpation.  Nondistended with normal bowel sounds x4 quadrants. Muskuloskeletal: No cyanosis, clubbing or edema noted bilaterally Neuro: CN II-XII intact, strength, sensation, reflexes Skin: No ulcerative lesions noted or rashes Psychiatry: Unable to assess judgment or mood due to somnolence.         Labs on Admission:  Basic Metabolic Panel: Recent Labs  Lab 06/14/24 2045 06/14/24 2103 06/14/24 2330  NA 143 143  143  K 6.6* 6.7* 5.6*  CL 111  --  114*  CO2 17*  --   --   GLUCOSE 52*  --  122*  BUN 87*  --  75*  CREATININE 3.42*  --  3.30*  CALCIUM  8.4*  --   --    Liver Function Tests: Recent Labs  Lab 06/14/24 2045  AST 242*  ALT 86*  ALKPHOS 54  BILITOT 6.2*  PROT 6.2*  ALBUMIN 2.4*   Recent Labs  Lab 06/14/24 2045  LIPASE 434*   No results for input(s): AMMONIA in the last 168 hours. CBC: Recent Labs  Lab 06/14/24 2045 06/14/24 2103 06/14/24 2330  WBC 14.3*  --   --   NEUTROABS 13.2*  --   --   HGB 8.7* 10.9* 9.2*  HCT 29.4* 32.0* 27.0*  MCV 64.3*  --   --  PLT 97*  --   --    Cardiac Enzymes: Recent Labs  Lab 06/14/24 2045  CKTOTAL 374    BNP (last 3 results) Recent Labs    03/17/24 1613 04/06/24 2000 06/14/24 2045  BNP >4,500.0* 3,125.8* 777.5*    ProBNP (last 3 results) No results for input(s): PROBNP in the last 8760 hours.  CBG: Recent Labs  Lab 06/14/24 2222 06/14/24 2255  GLUCAP 72 130*    Radiological Exams on Admission:   EKG: I independently viewed the EKG done and my findings are as followed: Sinus rhythm rate of 71.  QTc 565  Assessment/Plan Present on Admission:  Severe sepsis (HCC)  Principal Problem:   Severe sepsis (HCC)  Severe sepsis secondary to pneumonia, POA Leukocytosis 15K, lactic acidosis 7.5, 6.4 Continue broad-spectrum IV antibiotics, IV linezolid, IV Flagyl, IV cefepime Continue IV fluid with close monitoring of respiratory status. Received 3.5 L IV fluid boluses in the ER De-escalate antibiotics when able Continue to trend lactic acid  Lactic acidosis secondary to severe pneumonia and severe dehydration Lactic acid peaked at 7.5, downtrending Continue to trend Continue IV fluid as stated above  Elevated liver chemistries and hyperbilirubinemia Elevated lipase level CT abdomen pelvis was non-revealing Rule out acute cholangitis Repeat CMP and lipase level Follow MRCP with and without  contrast  Acute metabolic encephalopathy in the setting of the above Severe sepsis likely Follow-up ammonia level Reorient as needed  High anion gap metabolic acidosis secondary to lactic acidosis and acute renal insufficiency Serum bicarb 13, anion gap 18, lactic acid 7.5 Received 1 amp of bicarb.  Acute on chronic CKD 4 Creatinine 3.48 with GFR of 18 Avoid nephrotoxic agents, dehydration, and hypotension. Monitor urine output Repeat chemistry panel  Hyperkalemia secondary to acute renal insufficiency Treated with calcium  gluconate, Lokelma , amp of bicarb  Chronic combined diastolic and systolic CHF  Euvolemic on exam Last 2D echo done on 02/11/2024 revealed LVEF 20-25% with grade 1 diastolic dysfunction Closely monitor volume status and respiratory status while on IV fluid Monitor strict I's and O's and daily weight  Anemia of chronic disease No reported overt bleeding Hemoglobin 9.2 Continue to monitor  Severe protein calorie malnutrition BMI 15 Severe muscle mass loss Liberalize diet Feed when more alert  Hypoglycemia from poor oral intake Received IV D50 Start D10 W while NPO  QTc prolongation Admission twelve-lead EKG with QTc of 565 Optimize magnesium  and potassium levels Closely monitor on telemetry.  Goals of care Palliative care medicine consulted to assist with goals of care discussion Currently DNR/DNI    Critical care time: 65 minutes.    DVT prophylaxis: Subcu heparin  3 times daily  Code Status: DNR/DNI  Family Communication: Updated the patient's ex-wife via phone (1 of healthcare power of attorney decision-makers).  Disposition Plan: Admitted to progressive care unit.  Consults called:.  Palliative care medicine.  Admission status: Inpatient status.   Status is: Inpatient The patient requires at least 2 midnights for further evaluation and treatment of present condition.   Terry LOISE Hurst MD [facility] Pager  [phone]  If 7PM-7AM, please contact night-coverage [email or web address] Password [email or web address]  06/15/2024, 2:54 AM

## 2024-06-15 NOTE — Progress Notes (Signed)
 Patient admitted after midnight, please see H&P.  The patient lives alone.  His family last saw him on 06/09/2024.  the patient has been followed by hospice care.  For the past few days he has not allowed hospice RN into his house.  EMS was activated.  Upon EMS arrival, the patient was noted to be hypotensive and hypothermic.  Needs continued GOC-- for now will continue IVF, [employer], and abx.  Will hold on further imaging until family spoken to.  Harlene Bowl DO

## 2024-06-15 NOTE — Consult Note (Signed)
 Consultation Note Date: 06/15/2024   Patient Name: Joseph Tanner  DOB: [birth year]  MRN: [id number]  Age / Sex: 73 y.o., male  PCP: Pcp, No Referring Physician: Juvenal Harlene PENNER, DO  Reason for Consultation: Establishing goals of care  HPI/Patient Profile: 73 y.o. male  with past medical history of *** admitted on 06/14/2024 with ***.   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  The patient lives alone.  His family last saw him on 06/09/2024 and when they visited today he was noted to be altered while sitting on the couch.  Per his ex-wife via phone, the patient has been followed by hospice care.  For the past few days he has not allowed hospice RN into his house.  EMS was activated.  Upon EMS arrival, the patient was noted to be hypotensive and hypothermic.  Code sepsis was called.   On arrival to the ER the patient was hypothermic with rectal temperature of 88.6 F.  Peripheral blood cultures were obtained.  Chest x-ray revealed new patchy airspace disease in the left midlung concerning for pneumonia.  The patient was started on broad-spectrum IV antibiotics, cefepime, IV vancomycin, and IV Flagyl.  Lab studies were notable for hyperkalemia with serum potassium of 6.7, lactic acidosis, peaked at 7.5, hypoglycemia, elevated liver chemistries with T bilirubin of 6.2.  MRCP is pending.  Admitted by [facility], hospitalist service.   At the time of this visit the patient is somnolent but arousable.  Called his daughter multiple times with no answer.  Was able to reach his ex-wife Joseph Tanner.  She states decision-makers is her and his daughter Joseph Tanner.  She confirmed DNR/DNI status, no central line, no ICU transfer.  Continue to treat to treatable.  Palliative  care consulted to assist with establishing goals of care. Clinical Assessment and Goals of Care: *** This NP Ronal Plants reviewed medical records, received report from team, assessed the patient and then meet at the patient's bedside  to discuss diagnosis, prognosis, GOC, EOL wishes disposition and options.   Concept of Palliative Care was introduced as specialized medical care for people and their families living with serious illness.  If focuses on providing relief from the symptoms and stress of a serious illness.  The goal is to improve quality of life for both the patient and the family.  Created space and opportunity for patient  and family to explore thoughts and feelings regarding current medical situation     A  discussion was had today regarding advanced directives.  Concepts specific to code status, artifical feeding and hydration, continued IV antibiotics and rehospitalization was had.  The difference between a aggressive medical intervention path  and a palliative comfort care path for this patient at this time was had.  Values and goals of care important to patient and family were attempted to be elicited.   MOST form  Natural trajectory and expectations at EOL were discussed.  Questions and concerns addressed.  Patient  encouraged to call with questions or concerns.     PMT will continue to support holistically.             {Primary Decision Fjxzm:78612}    SUMMARY OF RECOMMENDATIONS   *** Code Status/Advance Care Planning: {Palliative Code status:23503}   Symptom Management:  ***  Palliative Prophylaxis:  {Palliative Prophylaxis:21015}  Additional Recommendations (Limitations, Scope, Preferences): {Recommended Scope and Preferences:21019}  Psycho-social/Spiritual:  Desire for further Chaplaincy support:{YES NO:22349} Additional Recommendations: {PAL SOCIAL:21064}  Prognosis:  {Palliative Care Prognosis:23504}  Discharge Planning: {Palliative  dispostion:23505}      Primary Diagnoses: Present on Admission:  Severe sepsis (HCC)   I have reviewed the medical record, interviewed the patient and family, and examined the patient. The following aspects are pertinent.  Past Medical History:  Diagnosis Date   COPD (chronic obstructive pulmonary disease) (HCC)    Hyperlipemia    Hypertension    NSTEMI (non-ST elevated myocardial infarction) (HCC)    Pure hypercholesterolemia 03/15/2021   Social History   Socioeconomic History   Marital status: Divorced    Spouse name: Not on file   Number of children: 5   Years of education: Not on file   Highest education level: Associate degree: academic program  Occupational History   Occupation: Retired  Tobacco Use   Smoking status: Some Days    Types: Cigarettes   Smokeless tobacco: Never  Vaping Use   Vaping status: Never Used  Substance and Sexual Activity   Alcohol use: Not Currently   Drug use: Yes    Types: Cocaine    Comment: 6 months   Sexual activity: Not on file  Other Topics Concern   Not on file  Social History Narrative   Not on file   Social Drivers of Health   Financial Resource Strain: Low Risk  (06/17/2023)   Overall Financial Resource Strain (CARDIA)    Difficulty of Paying Living Expenses: Not very hard  Food Insecurity: Food Insecurity Present (04/07/2024)   Hunger Vital Sign    Worried About Running Out of Food in the Last Year: Sometimes true    Ran Out of Food in the Last Year: Sometimes true  Transportation Needs: No Transportation Needs (04/07/2024)   PRAPARE - Administrator, Civil Service (Medical): No    Lack of Transportation (Non-Medical): No  Physical Activity: Not on file  Stress: Not on file  Social Connections: Unknown (04/07/2024)   Social Connection and Isolation Panel    Frequency of Communication with Friends and Family: Twice a week    Frequency of Social Gatherings with Friends and Family: Once a week    Attends  Religious Services: 1 to 4 times per year    Active Member of [employer] or Organizations: No    Attends Banker Meetings: Never    Marital Status: Patient declined  Recent Concern: Social Connections - Moderately Isolated (03/18/2024)   Social Connection and Isolation Panel    Frequency of Communication with Friends and Family: More than three times a week    Frequency of Social Gatherings with Friends and Family: More than three times a week    Attends Religious Services: More than 4 times per year    Active Member of [employer] or Organizations: No    Attends Banker Meetings: Never    Marital Status: Never married   Family History  Problem Relation  Age of Onset   Heart failure Mother    Heart failure Maternal Grandmother    Heart failure Maternal Grandfather    Heart failure Paternal Grandmother    Heart failure Paternal Grandfather    Scheduled Meds:  Chlorhexidine  Gluconate Cloth  6 each Topical Daily   heparin   5,000 Units Subcutaneous Q8H   sodium zirconium cyclosilicate   10 g Oral BID   Continuous Infusions:  albumin human Stopped (06/15/24 0610)   ceFEPime (MAXIPIME) IV     dextrose  30 mL/hr at 06/15/24 0755   linezolid (ZYVOX) IV     metronidazole     PRN Meds:.acetaminophen , dextrose , melatonin, polyethylene glycol, prochlorperazine Medications Prior to Admission:  Prior to Admission medications   Medication Sig Start Date End Date Taking? Authorizing Provider  acetaminophen  (TYLENOL ) 325 MG tablet Take 2 tablets (650 mg total) by mouth every 6 (six) hours as needed for mild pain (pain score 1-3) or fever (or Fever >/= 101). 02/20/24   Gonfa, Mignon DASEN, MD  albuterol  (VENTOLIN  HFA) 108 (90 Base) MCG/ACT inhaler Inhale 2 puffs into the lungs every 6 (six) hours as needed for wheezing or shortness of breath.    [provider]  atorvastatin  (LIPITOR) 40 MG tablet Take 1 tablet (40 mg total) by mouth every evening. 01/29/23   Lucien Orren SAILOR, PA-C   feeding supplement (ENSURE PLUS HIGH PROTEIN) LIQD Take 237 mLs by mouth 3 (three) times daily between meals. 02/20/24   Gonfa, Taye T, MD  folic acid  (FOLVITE ) 1 MG tablet Take 1 tablet (1 mg total) by mouth daily. 02/21/24   Gonfa, Taye T, MD  melatonin 5 MG TABS Take 1 tablet (5 mg total) by mouth at bedtime as needed (insomnia). 02/20/24   Gonfa, Taye T, MD  midodrine  (PROAMATINE ) 5 MG tablet Take 1 tablet (5 mg total) by mouth 3 (three) times daily with meals. 03/30/24   Fairy Frames, MD  Multiple Vitamin (MULTIVITAMIN WITH MINERALS) TABS tablet Take 1 tablet by mouth daily. 11/25/20   Jadine Toribio SQUIBB, MD  pantoprazole  (PROTONIX ) 40 MG tablet Take 1 tablet (40 mg total) by mouth 2 (two) times daily before a meal. 04/10/24 05/10/24  Ezenduka, Nkeiruka J, MD  tamsulosin  (FLOMAX ) 0.4 MG CAPS capsule Take 1 capsule (0.4 mg total) by mouth daily after breakfast. 02/17/24   Trixie Nilda HERO, MD  thiamine  (VITAMIN B-1) 100 MG tablet Take 1 tablet (100 mg total) by mouth daily. 02/21/24   Gonfa, Taye T, MD  tiotropium (SPIRIVA ) 18 MCG inhalation capsule Place 18 mcg into inhaler and inhale daily.    [provider]  Torsemide  40 MG TABS Take 20 mg by mouth daily. If you feel more short of breath, or swelling take 40 mg (1 tablet) 04/10/24   Ezenduka, Nkeiruka J, MD  traZODone  (DESYREL ) 50 MG tablet Take 50 mg by mouth at bedtime.    [provider]  umeclidinium bromide  (INCRUSE ELLIPTA ) 62.5 MCG/ACT AEPB Inhale 1 puff into the lungs daily.    [provider]   No Known Allergies Review of Systems  Physical Exam  Vital Signs: BP (!) 103/55   Pulse 90   Temp (!) 96.4 F (35.8 C)   Resp (!) 27   Ht 6' 3 (1.905 m)   Wt 54.4 kg   SpO2 100%   BMI 15.00 kg/m  Pain Scale: PAINAD       SpO2: SpO2: 100 % O2 Device:SpO2: 100 % O2 Flow Rate: .O2 Flow Rate (L/min):  2 L/min  IO: Intake/output summary:  Intake/Output Summary (Last 24 hours) at 06/15/2024 1012 Last  data filed at 06/14/2024 2231 Gross per 24 hour  Intake 1300 ml  Output --  Net 1300 ml    LBM:   Baseline Weight: Weight: 54.4 kg Most recent weight: Weight: 54.4 kg     Palliative Assessment/Data:     Time In: *** Time Out: *** Time Total: *** Greater than 50%  of this time was spent counseling and coordinating care related to the above assessment and plan.  Signed by: Ronal Plants, NP   Please contact Palliative Medicine Team phone at [phone] for questions and concerns.  For individual provider: See Tracey

## 2024-06-15 NOTE — Progress Notes (Signed)
   06/15/24 1445  Assess: MEWS Score  Temp 98.6 F (37 C)  BP 98/61  MAP (mmHg) 74  Pulse Rate (!) 109  ECG Heart Rate (!) 110  Resp (!) 49  SpO2 100 %  O2 Device Nasal Cannula  O2 Flow Rate (L/min) 3 L/min  Assess: MEWS Score  MEWS Temp 0  MEWS Systolic 1  MEWS Pulse 1  MEWS RR 3  MEWS LOC 1  MEWS Score 6  MEWS Score Color Red  Assess: if the MEWS score is Yellow or Red  Were vital signs accurate and taken at a resting state? Yes  Does the patient meet 2 or more of the SIRS criteria? Yes  Does the patient have a confirmed or suspected source of infection? Yes  MEWS guidelines implemented  Yes, red  Treat  MEWS Interventions Considered administering scheduled or prn medications/treatments as ordered  Take Vital Signs  Increase Vital Sign Frequency  Red: Q1hr x2, continue Q4hrs until patient remains green for 12hrs  Escalate  MEWS: Escalate Red: Discuss with charge nurse and notify provider. Consider notifying RRT. If remains red for 2 hours consider need for higher level of care  Notify: Charge Nurse/RN  Name of Charge Nurse/RN Notified Medford Sours  Provider Notification  Provider Name/Title Dr. juvenal  Date Provider Notified 06/15/24  Time Provider Notified 1500  Method of Notification Page  Notification Reason Other (Comment)  Provider response See new orders  Date of Provider Response 06/15/24  Time of Provider Response 1510  Assess: SIRS CRITERIA  SIRS Temperature  0  SIRS Respirations  1  SIRS Pulse 1  SIRS WBC 1  SIRS Score Sum  3

## 2024-06-15 NOTE — Progress Notes (Signed)
 [facility] Liaison note   Joseph Tanner is a current hospice patient with a terminal diagnosis of heart failure. He recently missed multiple hospice visits due to not letting our staff into his apartment and not answering phone calls. [place]. ACC contacted his daughter who requested an EMS wellness visit. EMS was able to gain access to patient's residence and per daughter found patient to be disoriented, shaky and soiled. Systolic blood pressure of 86 and unable to register diastolic. Pulse was 20 and oxygen sats in the 70s. Patient was admitted to [facility] on 11.17.25 for pneumonia and sepsis. Per Dr. Ephriam Monguilod with [facility], this is a related hospital admission. Patient is a limited DNR.   Met with patient and family at bedside in the ED. Patient with Hayden lights, IV fluids and IV antibiotics to start soon. Family is electing treatment at this time but may decide to move to comfort care soon. They would ideally like for other family members to have time to make it into town.   Pt is inpatient appropriate due to need for IV antibiotics and skilled monitoring.   VS: 97.9/110/44   94/65    98% on 2 lpm [place]   I/O: 1,300/none recorded   Abnormal Labs:  06/15/24  Potassium: 5.2 (H) Chloride: 112 (H) CO2: 13 (L) Glucose: 41 (LL) BUN: 82 (H) Creatinine: 3.48 (H) Calcium : 8.2 (L) Anion gap: 18 (H) Phosphorus: 5.1 (H) Albumin: 2.1 (L) Lipase: 459 (H) AST: 216 (H) ALT: 78 (H) Total Protein: 5.7 (L) Total Bilirubin: 6.2 (H) GFR, Estimated: 18 (L) Lactic Acid, Venous: 6.4 (HH) WBC: 15.0 (H) RBC: 4.15 (L) Hemoglobin: 7.8 (L) HCT: 26.5 (L) MCV: 63.9 (L) MCH: 18.8 (L) MCHC: 29.4 (L) RDW: 25.7 (H) Platelets: 90 (L) nRBC: 0.5 (H) NEUT#: 13.6 (H) Lymphs Abs: 0.3 (L) Abs Immature Granulocytes: 0.14 (H) pCO2, Ven: 22 (L) pO2, Ven: 83 (H) Acid-base deficit: 11.8  (H) Bicarbonate: 12.1 (L) Glucose-Capillary: 26 (LL)  Diagnostics:  PORTABLE CHEST 1 VIEW IMPRESSION: 1. No acute findings in the abdomen or pelvis on this noncontrast study, acknowledging limitations from motion and streak artifact. 2. Nonobstructive bilateral nephrolithiasis measuring up to 2.4 cm on the left, punctate on the right. 3. Colonic diverticulosis with no acute diverticulitis. 4. Bilateral trace pleural effusions, left greater than right. 5. Cardiomegaly. 6. Severe atherosclerotic plaque of the abdominal aorta and its main branches. 7. Other, non-acute and/or normal findings as above.   IV/PRN meds: Vancomycin 1,000mg  IV x1, Flagyl 500mg  IVPB x2, cefepime 2g IV x1, calcium  gluconate 1g/50mL IVPB x1,    Assessment and Plan (per Shona Terry SAILOR, DO 11.18.25): Present on Admission:  Severe sepsis (HCC)   Principal Problem:   Severe sepsis (HCC)   Severe sepsis secondary to pneumonia, POA Leukocytosis 15K, lactic acidosis 7.5, 6.4 Continue broad-spectrum IV antibiotics, IV linezolid, IV Flagyl, IV cefepime Continue IV fluid with close monitoring of respiratory status. Received 3.5 L IV fluid boluses in the ER De-escalate antibiotics when able Continue to trend lactic acid   Lactic acidosis secondary to severe pneumonia and severe dehydration Lactic acid peaked at 7.5, downtrending Continue to trend Continue IV fluid as stated above   Elevated liver chemistries and hyperbilirubinemia Elevated lipase level CT abdomen pelvis was non-revealing Rule out acute cholangitis Repeat CMP and lipase level Follow MRCP with and without contrast   Acute metabolic encephalopathy in the setting of the above  Severe sepsis likely Follow-up ammonia level Reorient as needed   High anion gap metabolic acidosis secondary to lactic acidosis and acute renal insufficiency Serum bicarb 13, anion gap 18, lactic acid 7.5 Received 1 amp of bicarb.   Acute on chronic CKD 4 Creatinine  3.48 with GFR of 18 Avoid nephrotoxic agents, dehydration, and hypotension. Monitor urine output Repeat chemistry panel   Hyperkalemia secondary to acute renal insufficiency Treated with calcium  gluconate, Lokelma , amp of bicarb   Chronic combined diastolic and systolic CHF  Euvolemic on exam Last 2D echo done on 02/11/2024 revealed LVEF 20-25% with grade 1 diastolic dysfunction Closely monitor volume status and respiratory status while on IV fluid Monitor strict I's and O's and daily weight   Anemia of chronic disease No reported overt bleeding Hemoglobin 9.2 Continue to monitor   Severe protein calorie malnutrition BMI 15 Severe muscle mass loss Liberalize diet Feed when more alert   Hypoglycemia from poor oral intake Received IV D50 Start D10 W while NPO   QTc prolongation Admission twelve-lead EKG with QTc of 565 Optimize magnesium  and potassium levels Closely monitor on telemetry.   Goals of care Palliative care medicine consulted to assist with goals of care discussion Currently DNR/DNI   Discharge Planning: Limited DNR, family may elect comfort care    Family Contact: daughter  IDT: updated   Goals of Care: ongoing, limited DNR  Medication List and Transfer Summary uploaded to patient chart.   Should patient need ambulance transport at discharge, please call [facility] as we contract with them for our active hospice patients.   Please call with any hospice questions or concerns.   Thank you, Eleanor Nail, [facility] Liaison [phone]

## 2024-06-15 NOTE — ED Notes (Signed)
 CCMD contacted to place pt on monitor

## 2024-06-16 DIAGNOSIS — A419 Sepsis, unspecified organism: Secondary | ICD-10-CM | POA: Diagnosis not present

## 2024-06-16 DIAGNOSIS — R6520 Severe sepsis without septic shock: Secondary | ICD-10-CM | POA: Diagnosis not present

## 2024-06-16 LAB — COMPREHENSIVE METABOLIC PANEL WITH GFR
ALT: 63 U/L — ABNORMAL HIGH (ref 0–44)
AST: 174 U/L — ABNORMAL HIGH (ref 15–41)
Albumin: 3.2 g/dL — ABNORMAL LOW (ref 3.5–5.0)
Alkaline Phosphatase: 35 U/L — ABNORMAL LOW (ref 38–126)
Anion gap: 22 — ABNORMAL HIGH (ref 5–15)
BUN: 91 mg/dL — ABNORMAL HIGH (ref 8–23)
CO2: 13 mmol/L — ABNORMAL LOW (ref 22–32)
Calcium: 7.9 mg/dL — ABNORMAL LOW (ref 8.9–10.3)
Chloride: 107 mmol/L (ref 98–111)
Creatinine, Ser: 4.44 mg/dL — ABNORMAL HIGH (ref 0.61–1.24)
GFR, Estimated: 13 mL/min — ABNORMAL LOW (ref >60)
Glucose, Bld: 87 mg/dL (ref 70–99)
Potassium: 6 mmol/L — ABNORMAL HIGH (ref 3.5–5.1)
Sodium: 142 mmol/L (ref 135–145)
Total Bilirubin: 6.7 mg/dL — ABNORMAL HIGH (ref 0.0–1.2)
Total Protein: 6.1 g/dL — ABNORMAL LOW (ref 6.5–8.1)

## 2024-06-16 LAB — CBC
HCT: 22.5 % — ABNORMAL LOW (ref 39.0–52.0)
Hemoglobin: 6.8 g/dL — CL (ref 13.0–17.0)
MCH: 18.8 pg — ABNORMAL LOW (ref 26.0–34.0)
MCHC: 30.2 g/dL (ref 30.0–36.0)
MCV: 62.3 fL — ABNORMAL LOW (ref 80.0–100.0)
Platelets: 70 K/uL — ABNORMAL LOW (ref 150–400)
RBC: 3.61 MIL/uL — ABNORMAL LOW (ref 4.22–5.81)
RDW: 25.1 % — ABNORMAL HIGH (ref 11.5–15.5)
WBC: 10.8 K/uL — ABNORMAL HIGH (ref 4.0–10.5)
nRBC: 0.9 % — ABNORMAL HIGH (ref 0.0–0.2)

## 2024-06-16 LAB — LACTIC ACID, PLASMA: Lactic Acid, Venous: 6.4 mmol/L (ref 0.5–1.9)

## 2024-06-16 LAB — PREPARE RBC (CROSSMATCH)

## 2024-06-16 LAB — GLUCOSE, CAPILLARY
Glucose-Capillary: 71 mg/dL (ref 70–99)
Glucose-Capillary: 97 mg/dL (ref 70–99)

## 2024-06-16 MED ORDER — SODIUM BICARBONATE 8.4 % IV SOLN
INTRAVENOUS | Status: DC
  Filled 2024-06-16: qty 1000

## 2024-06-16 MED ORDER — DEXTROSE 10 % IV SOLN: INTRAVENOUS | Status: DC

## 2024-06-16 MED ORDER — SODIUM CHLORIDE 0.9% IV SOLUTION: Freq: Once | INTRAVENOUS | Status: DC

## 2024-06-16 MED ORDER — SODIUM ZIRCONIUM CYCLOSILICATE 10 G PO PACK
10.0000 g | PACK | Freq: Three times a day (TID) | ORAL | Status: DC
  Filled 2024-06-16: qty 1

## 2024-06-16 MED ORDER — GLYCOPYRROLATE 0.2 MG/ML IJ SOLN: 0.1000 mg | Freq: Three times a day (TID) | INTRAMUSCULAR | Status: DC | PRN

## 2024-06-16 MED ORDER — SODIUM CHLORIDE 0.9 % IV SOLN
3.0000 g | Freq: Two times a day (BID) | INTRAVENOUS | Status: DC
  Administered 2024-06-16: 3 g via INTRAVENOUS
  Filled 2024-06-16: qty 8

## 2024-06-17 LAB — URINE CULTURE: Culture: >=100000 — AB

## 2024-06-19 LAB — CULTURE, BLOOD (ROUTINE X 2)
Culture: NO GROWTH
Culture: NO GROWTH
Special Requests: ADEQUATE

## 2024-06-20 LAB — BPAM RBC
Blood Product Expiration Date: 202512162359
Unit Type and Rh: 5100

## 2024-06-20 LAB — TYPE AND SCREEN
ABO/RH(D): O POS
Antibody Screen: NEGATIVE
Unit division: 0

## 2024-06-28 NOTE — Progress Notes (Signed)
 Pt waiting for paperwork approval.

## 2024-06-28 NOTE — Progress Notes (Signed)
 RN and nurse tech, repositioned patient. Patient had an episode of hematemesis, oral suction used. Upon assessment patient has no pulse. MD notified.

## 2024-06-28 NOTE — Progress Notes (Signed)
 Hgb is down to 6.8 this morning. Plan to transfuse 1 unit RBCs.

## 2024-06-28 NOTE — Progress Notes (Signed)
 D/C delayed, pt SHOB, anxious and desats to 80's. LCSW made aware to order for a loaner O2.

## 2024-06-28 NOTE — TOC Initial Note (Signed)
 Transition of Care [facility]) - Initial/Assessment Note    Patient Details  Name: Joseph Tanner MRN: [id number] Date of Birth: April 08, 1951  [facility]) CM/SW Contact:    Inocente GORMAN Kindle, LCSW Phone Number: 06-19-2024, 9:49 AM  Clinical Narrative:                 Patient admitted from home alone with [facility]. He had not let the Hospice RN inside the apartment so a well check was completed. Palliative has been consulted and awaiting family.     Barriers to Discharge: Continued Medical Work up   Patient Goals and CMS Choice            Expected Discharge Plan and Services In-house Referral: Clinical Social Work     Living arrangements for the past 2 months: Apartment                                      Prior Living Arrangements/Services Living arrangements for the past 2 months: Apartment Lives with:: Self Patient language and need for interpreter reviewed:: Yes Do you feel safe going back to the place where you live?: Yes      Need for Family Participation in Patient Care: Yes (Comment) Care giver support system in place?: Yes (comment)   Criminal Activity/Legal Involvement Pertinent to Current Situation/Hospitalization: No - Comment as needed  Activities of Daily Living   ADL Screening (condition at time of admission) Independently performs ADLs?: No Is the patient deaf or have difficulty hearing?: No Does the patient have difficulty seeing, even when wearing glasses/contacts?: No Does the patient have difficulty concentrating, remembering, or making decisions?: Yes  Permission Sought/Granted Permission sought to share information with : Facility Medical Sales Representative, Family Supports Permission granted to share information with : No  Share Information with NAME: Rosemary, [employer] -Daughter   [phone]  Permission granted to share info w AGENCY: Hospice        Emotional Assessment Appearance:: Appears stated  age Attitude/Demeanor/Rapport: Unable to Assess Affect (typically observed): Unable to Assess Orientation: :  (Disoriented x4) Alcohol / Substance Use: Not Applicable Psych Involvement: No (comment)  Admission diagnosis:  Delirium [R41.0] Severe sepsis (HCC) [A41.9, R65.20] Pneumonia of left lower lobe due to infectious organism [J18.9] Sepsis, due to unspecified organism, unspecified whether acute organ dysfunction present [facility]) [A41.9] Patient Active Problem List   Diagnosis Date Noted   Severe sepsis (HCC) 06/15/2024   Acute GI bleeding 04/07/2024   GI bleed 04/06/2024   Heart failure (HCC) 03/19/2024   UTI (urinary tract infection) 03/19/2024   Cardiogenic shock (HCC) 02/12/2024   CAP (community acquired pneumonia) 02/10/2024   Urinary tract infection 02/10/2024   Lactic acidosis 02/10/2024   Normal anion gap metabolic acidosis 02/10/2024   Elevated troponin 02/10/2024   Hyperkalemia 02/10/2024   Pancytopenia (HCC) 02/10/2024   Hypomagnesemia 02/10/2024   Hypotension 06/17/2023   Cocaine abuse (HCC) 06/17/2023   Acute on chronic combined systolic and diastolic CHF (congestive heart failure) (HCC) 06/17/2023   Acute kidney injury superimposed on chronic kidney disease 06/14/2023   Pure hypercholesterolemia 03/15/2021   Demand ischemia (HCC) 11/21/2020   Coronary artery disease 11/21/2020   CKD (chronic kidney disease) stage 4, GFR 15-29 ml/min (HCC) 11/21/2020   Nephrolithiasis 11/21/2020   Pyuria 11/21/2020   Protein-calorie malnutrition, severe 11/21/2020   Acute on chronic systolic CHF (congestive heart failure) (HCC) 11/20/2020   NSTEMI (non-ST  elevated myocardial infarction) (HCC) 10/14/2020   PCP:  Freddrick, No Pharmacy:   [facility] [id number], [place] - [address] [place] [address] Phone: [phone] Fax: [phone]  [facility] [address] [place]  [address] Phone: [phone] Fax: [phone]  [facility] [place] [facility], [place] - [address] [place] [address] Phone: [phone] Fax: [phone]     Social Drivers of Health (SDOH) Social History: SDOH Screenings   Food Insecurity: Food Insecurity Present (06/15/2024)  Housing: Low Risk  (06/15/2024)  Transportation Needs: No Transportation Needs (06/15/2024)  Utilities: Not At Risk (06/15/2024)  Alcohol Screen: Low Risk  (06/17/2023)  Financial Resource Strain: Low Risk  (06/17/2023)  Social Connections: Unknown (04/07/2024)  Recent Concern: Social Connections - Moderately Isolated (03/18/2024)  Tobacco Use: High Risk (06/14/2024)   SDOH Interventions:     Readmission Risk Interventions    2024-07-12    9:46 AM 02/11/2024   11:47 AM  Readmission Risk Prevention Plan  Transportation Screening Complete Complete  PCP or Specialist Appt within 5-7 Days  Complete  Home Care Screening  Complete  Medication Review (RN CM)  Complete  Medication Review (RN Care Manager) Complete   PCP or Specialist appointment within 3-5 days of discharge Complete   HRI or Home Care Consult Complete   SW Recovery Care/Counseling Consult Complete   Palliative Care Screening Complete   Skilled Nursing Facility Not Applicable

## 2024-06-28 NOTE — Plan of Care (Signed)

## 2024-06-28 NOTE — Progress Notes (Addendum)
 PROGRESS NOTE     Patient Demographics:    Joseph Tanner, is a 73 y.o. male, DOB - [birth year], FMW:[id number]  Outpatient Primary MD for the patient is Pcp, No    LOS - 1  Admit date - 06/14/2024    Chief Complaint  Patient presents with   Code Sepsis       Brief Narrative (HPI from H&P)    73 y.o. 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  The patient lives alone.  His family last saw him on 06/09/2024 and when they visited today he was noted to be altered while sitting on the couch.  Per his ex-wife via phone, the patient has been followed by hospice care.  For the past few days he has not allowed hospice RN into his house.  EMS was activated.  Upon EMS arrival, the patient was noted to be hypotensive and hypothermic.  Code sepsis was called.   He was diagnosed with pneumonia, sepsis, admitted to the hospital for further care.   Subjective:    Joseph Tanner today in bed extremely frail and debilitated, appears to be in no distress.   Assessment  & Plan :   Severe sepsis due to pneumonia in a patient who is under hospice care at home, I will likely has aspirated.  He was sent to the hospital per hospice RN request, had long discussion with patient's daughter who is the primary decision maker, wants to continue gentle medical care which will include IV fluids, antibiotics, will involve palliative care for goals of care, eventually he will be best served with residential hospice in my opinion but will let palliative care decide.  Most of his care will be directed  towards keeping him comfortable, for now antibiotics and IV fluids will be done but no further escalation.  Hypotension, hypothermia, AKI, shock liver, metabolic acidosis and hyperkalemia.  All due to severe sepsis and endorgan damage, p.o. intake unreliable high  aspiration risk, bicarb drip, gentle medical treatment as above, goal of care directed towards comfort.  Chronic anemia with some acute drop due to heme dilution from IV fluids.  Per family wishes 1 unit of packed RBC on 2024/06/17.    Chronic systolic and diastolic CHF, EF 74%.  Supportive care.  Currently extremely dehydrated and septic.  Blood pressure too low to tolerate beta-blocker, ACE/ARB or Entresto .  Again goal of care is gentle medical treatment directed towards keeping him comfortable.      Condition - Extremely Guarded  Family Communication  : Discussed with patient's primary decision maker patient's daughter on 06-17-24.  Code Status : DNR  Consults  : Palliative care  PUD Prophylaxis :    Procedures  :     CT chest abdomen pelvis.  CT head and C-spine.  Nonacute.      Disposition Plan  :    Status is: Inpatient   DVT Prophylaxis  :    heparin  injection 5,000 Units Start: 06/15/24 0600    Lab Results  Component Value Date   PLT 70 (L) Jun 17, 2024    Diet :  Diet Order             Diet NPO time specified  Diet effective now                    Inpatient Medications  Scheduled Meds:  sodium chloride    Intravenous Once   Chlorhexidine  Gluconate Cloth  6 each Topical Daily   heparin   5,000 Units Subcutaneous Q8H   midodrine   2.5 mg Oral TID WC   Continuous Infusions:  ceFEPime (MAXIPIME) IV 2 g (06/15/24 2026)   dextrose      PRN Meds:.acetaminophen , dextrose , melatonin, polyethylene glycol, prochlorperazine    Objective:   Vitals:   06-17-2024 0300 06/17/2024 0400 06/17/2024 0500 06/17/2024 0821  BP: (!) 80/59 (!) 76/54 (!) 76/56 (!) 82/53  Pulse: 86 82 82 78  Resp: (!) 37 19 (!)  43 (!) 38  Temp: (!) 97.4 F (36.3 C) 97.6 F (36.4 C)  97.6 F (36.4 C)  TempSrc: Axillary Axillary  Axillary  SpO2: 98% 94% 98%   Weight:      Height:        Wt Readings from Last 3 Encounters:  06/14/24 54.4 kg  04/06/24 77.1 kg  04/02/24 60.8 kg     Intake/Output Summary (Last 24 hours) at 2024-06-17 0832 Last data filed at 06/15/2024 1803 Gross per 24 hour  Intake 483.47 ml  Output --  Net 483.47 ml     Physical Exam  Awake but appears extremely weak and frail, denies any headache or chest pain [place].AT,PERRAL Supple Neck, No JVD,   Symmetrical Chest wall movement, Good air movement bilaterally, CTAB RRR,No Gallops,Rubs or new Murmurs,  +ve B.Sounds, Abd Soft, No tenderness,   No Cyanosis, Clubbing or edema       Data Review:    Recent Labs  Lab 06/14/24 2045 06/14/24 2103 06/14/24 2330 06/15/24 0430 06/17/2024 0257  WBC 14.3*  --   --  15.0* 10.8*  HGB 8.7* 10.9* 9.2* 7.8* 6.8*  HCT 29.4* 32.0* 27.0* 26.5* 22.5*  PLT 97*  --   --  90* 70*  MCV 64.3*  --   --  63.9* 62.3*  MCH 19.0*  --   --  18.8* 18.8*  MCHC 29.6*  --   --  29.4* 30.2  RDW 25.9*  --   --  25.7* 25.1*  LYMPHSABS 0.3*  --   --  0.3*  --   MONOABS 0.6  --   --  1.0  --   EOSABS 0.0  --   --  0.0  --   BASOSABS 0.0  --   --  0.0  --     Recent Labs  Lab 06/14/24 2045 06/14/24 2103 06/14/24 2104 06/14/24 2330 06/15/24 0044 06/15/24 0244 06/15/24 0430 06/15/24 0647 06/15/24 0846 06/15/24 0932 06/29/2024 0257  NA 143 143  --  143  --   --  143  --   --   --  142  K 6.6* 6.7*  --  5.6*  --   --  5.2*  --   --   --  6.0*  CL 111  --   --  114*  --   --  112*  --   --   --  107  CO2 17*  --   --   --   --   --  13*  --   --   --  13*  ANIONGAP 15  --   --   --   --   --  18*  --   --   --  22*  GLUCOSE 52*  --   --  122*  --   --  41*  --   --   --  87  BUN 87*  --   --  75*  --   --  82*  --   --   --  91*  CREATININE 3.42*  --   --  3.30*  --   --  3.48*  --   --   --  4.44*   AST 242*  --   --   --   --   --  216*  --   --   --  174*  ALT 86*  --   --   --   --   --  78*  --   --   --  63*  ALKPHOS 54  --   --   --   --   --  50  --   --   --  35*  BILITOT 6.2*  --   --   --   --   --  6.2*  --   --   --  6.7*  ALBUMIN 2.4*  --   --   --   --   --  2.1*  --   --   --  3.2*  LATICACIDVEN  --   --    < >  --    < > 7.5* 6.4*  --  4.9* 5.8* 6.4*  INR 2.0*  --   --   --   --   --   --   --   --   --   --   TSH 1.101  --   --   --   --   --   --   --   --   --   --   AMMONIA  --   --   --   --   --   --   --  19  --   --   --   BNP 777.5*  --   --   --   --   --   --   --   --   --   --  MG  --   --   --   --   --   --  1.8  --   --   --   --   PHOS  --   --   --   --   --   --  5.1*  --   --   --   --   CALCIUM  8.4*  --   --   --   --   --  8.2*  --   --   --  7.9*   < > = values in this interval not displayed.      Recent Labs  Lab 06/14/24 2045 06/14/24 2104 06/15/24 0244 06/15/24 0430 06/15/24 0647 06/15/24 0846 06/15/24 0932 2024/07/03 0257  LATICACIDVEN  --    < > 7.5* 6.4*  --  4.9* 5.8* 6.4*  INR 2.0*  --   --   --   --   --   --   --   TSH 1.101  --   --   --   --   --   --   --   AMMONIA  --   --   --   --  19  --   --   --   BNP 777.5*  --   --   --   --   --   --   --   MG  --   --   --  1.8  --   --   --   --   CALCIUM  8.4*  --   --  8.2*  --   --   --  7.9*   < > = values in this interval not displayed.    --------------------------------------------------------------------------------------------------------------- Lab Results  Component Value Date   CHOL 123 11/23/2020   HDL 39 (L) 11/23/2020   LDLCALC 56 11/23/2020   TRIG 138 11/23/2020   CHOLHDL 3.2 11/23/2020    No results found for: HGBA1C Recent Labs    06/14/24 2045  TSH 1.101   No results for input(s): VITAMINB12, FOLATE, FERRITIN, TIBC, IRON, RETICCTPCT in the last 72  hours. ------------------------------------------------------------------------------------------------------------------ Cardiac Enzymes No results for input(s): CKMB, TROPONINI, MYOGLOBIN in the last 168 hours.  Invalid input(s): CK  Micro Results Recent Results (from the past 240 hours)  Blood Culture (routine x 2)     Status: None (Preliminary result)   Collection Time: 06/14/24  8:40 PM   Specimen: BLOOD RIGHT ARM  Result Value Ref Range Status   Specimen Description BLOOD RIGHT ARM  Final   Special Requests   Final    BOTTLES DRAWN AEROBIC AND ANAEROBIC Blood Culture results may not be optimal due to an inadequate volume of blood received in culture bottles   Culture   Final    NO GROWTH 2 DAYS Performed at [facility], [address] [place], [place] [address]    Report Status PENDING  Incomplete  Resp panel by RT-PCR (RSV, Flu A&B, Covid) Anterior Nasal Swab     Status: None   Collection Time: 06/14/24  8:43 PM   Specimen: Anterior Nasal Swab  Result Value Ref Range Status   SARS Coronavirus 2 by RT PCR NEGATIVE NEGATIVE Final   Influenza A by PCR NEGATIVE NEGATIVE Final   Influenza B by PCR NEGATIVE NEGATIVE Final    Comment: (NOTE) The Xpert Xpress SARS-CoV-2/FLU/RSV plus assay is intended as an aid in the diagnosis of influenza from Nasopharyngeal swab  specimens and should not be used as a sole basis for treatment. Nasal washings and aspirates are unacceptable for Xpert Xpress SARS-CoV-2/FLU/RSV testing.  Fact Sheet for Patients: bloggercourse.com  Fact Sheet for Healthcare Providers: seriousbroker.it  This test is not yet approved or cleared by the [place]  FDA and has been authorized for detection and/or diagnosis of SARS-CoV-2 by FDA under an Emergency Use Authorization (EUA). This EUA will remain in effect (meaning this test can be used) for the duration of the COVID-19 declaration under  Section 564(b)(1) of the Act, 21 U.S.C. section 360bbb-3(b)(1), unless the authorization is terminated or revoked.     Resp Syncytial Virus by PCR NEGATIVE NEGATIVE Final    Comment: (NOTE) Fact Sheet for Patients: bloggercourse.com  Fact Sheet for Healthcare Providers: seriousbroker.it  This test is not yet approved or cleared by the [place]  FDA and has been authorized for detection and/or diagnosis of SARS-CoV-2 by FDA under an Emergency Use Authorization (EUA). This EUA will remain in effect (meaning this test can be used) for the duration of the COVID-19 declaration under Section 564(b)(1) of the Act, 21 U.S.C. section 360bbb-3(b)(1), unless the authorization is terminated or revoked.  Performed at [facility], [address] [place], [place] [address]     Radiology Report CT ABDOMEN PELVIS WO CONTRAST Result Date: 06/15/2024 EXAM: CT ABDOMEN AND PELVIS WITHOUT CONTRAST 06/15/2024 12:20:24 AM TECHNIQUE: CT of the abdomen and pelvis was performed without the administration of intravenous contrast. Multiplanar reformatted images are provided for review. Automated exposure control, iterative reconstruction, and/or weight-based adjustment of the mA/kV was utilized to reduce the radiation dose to as low as reasonably achievable. COMPARISON: CT chest 02/10/2024 CLINICAL HISTORY: abd pain FINDINGS: LOWER CHEST: Bilateral trace pleural effusions, left greater than right. Bibasilar atelectasis. Cardiomegaly. Coronary artery calcification. Cardiac findings suggestive of anemia. LIVER: The liver is unremarkable. GALLBLADDER AND BILE DUCTS: Gallbladder is unremarkable. No biliary ductal dilatation. SPLEEN: No acute abnormality. PANCREAS: No acute abnormality. ADRENAL GLANDS: No acute abnormality. KIDNEYS, URETERS AND BLADDER: Right nephrolithiasis. Left nephrolithiasis measuring up to 2.4 cm. No associated hydroureteronephrosis. No  ureterolithiasis. No perinephric or periureteral stranding. Urinary bladder decompressed with foley catheter tip and balloon terminating within the lumen. GI AND BOWEL: Stomach demonstrates no acute abnormality. No definite small or large bowel thickening. Colonic diverticulosis. No pneumatosis. There is no bowel obstruction. APPENDIX: The appendix is unremarkable. PERITONEUM AND RETROPERITONEUM: Trace simple free fluid consistent with ascites. No free air. VASCULATURE: Severe atherosclerotic plaque of the abdominal aorta and its main branches. LYMPH NODES: No lymphadenopathy. REPRODUCTIVE ORGANS: Prostate is unremarkable. BONES AND SOFT TISSUES: LUMBAR spine severe degenerative changes. Diffusely decreased bone density. No focal soft tissue abnormality. Limited evaluation on this noncontrast study due to motion artifact and streak artifact. IMPRESSION: 1. No acute findings in the abdomen or pelvis on this noncontrast study, acknowledging limitations from motion and streak artifact. 2. Nonobstructive bilateral nephrolithiasis measuring up to 2.4 cm on the left, punctate on the right. 3. Colonic diverticulosis with no acute diverticulitis. 4. Bilateral trace pleural effusions, left greater than right. 5. Cardiomegaly. 6. Severe atherosclerotic plaque of the abdominal aorta and its main branches. 7. Other, non-acute and/or normal findings as above. Electronically signed by: Morgane Naveau MD 06/15/2024 01:46 AM EST RP Workstation: [id number]   CT Head Wo Contrast Addendum Date: 06/15/2024  ADDENDUM: Consider repeat CT head given motion artifact . ---------------------------------------------------- Electronically signed by: Morgane Naveau MD 06/15/2024 01:38 AM EST RP Workstation: [id number]   Result  Date: 06/15/2024    EXAM: CT HEAD WITHOUT CONTRAST 06/15/2024 12:20:24 AM TECHNIQUE: CT of the head was performed without the administration of intravenous contrast. Automated exposure control, iterative  reconstruction, and/or weight based adjustment of the mA/kV was utilized to reduce the radiation dose to as low as reasonably achievable. COMPARISON: None available. CLINICAL HISTORY: unknown fall, sig delirium FINDINGS: BRAIN AND VENTRICLES: No acute hemorrhage. No evidence of acute infarct. Cerebral ventricle sizes are concordant with the degree of cerebral volume loss. Patchy and confluent areas of decreased attenuation are noted throughout the deep and periventricular white matter of the cerebral hemispheres bilaterally suggestive of chronic microvascular ischemic changes. Left parietooccipital encephalomalacia. Atherosclerotic calcifications are present within the cavernous internal carotid arteries. No hydrocephalus. No extra-axial collection. No mass effect or midline shift. ORBITS: No acute abnormality. SINUSES: No acute abnormality. SOFT TISSUES AND SKULL: No acute soft tissue abnormality. No skull fracture. IMPRESSION: 1. No acute intracranial abnormality. Electronically signed by: Morgane Naveau MD 06/15/2024 12:59 AM EST RP Workstation: [id number]   CT Cervical Spine Wo Contrast Result Date: 06/15/2024 EXAM: CT CERVICAL SPINE WITHOUT CONTRAST 06/15/2024 12:20:24 AM TECHNIQUE: CT of the cervical spine was performed without the administration of intravenous contrast. Multiplanar reformatted images are provided for review. Automated exposure control, iterative reconstruction, and/or weight based adjustment of the mA/kV was utilized to reduce the radiation dose to as low as reasonably achievable. COMPARISON: None available. CLINICAL HISTORY: fall w/ delirium FINDINGS: CERVICAL SPINE: BONES AND ALIGNMENT: Grade 1 anterolisthesis of C3 on C4. Mild retrolisthesis of C4 on C5 and C5 on C6. No acute fracture. DEGENERATIVE CHANGES: Multilevel severe degenerative change of the spine. No associated severe osseous neural foraminal stenosis. SOFT TISSUES: No prevertebral soft tissue swelling. VASCULATURE:  Atherosclerotic plaque of the carotid arteries within the neck. IMPRESSION: 1. No acute abnormality of the cervical spine. Electronically signed by: Morgane Naveau MD 06/15/2024 12:49 AM EST RP Workstation: [id number]   DG Chest Port 1 View Result Date: 06/14/2024 CLINICAL DATA:  Questionable sepsis EXAM: PORTABLE CHEST 1 VIEW COMPARISON:  Chest x-ray 04/06/2024 FINDINGS: There is new patchy airspace disease in the left mid lung. The left lung base has been excluded. The heart is enlarged, unchanged. No pneumothorax or acute fracture visualized. IMPRESSION: New patchy airspace disease in the left mid lung concerning for pneumonia. Follow-up PA and lateral chest x-ray recommended in 4-6 weeks to confirm resolution. Electronically Signed   By: Greig Pique M.D.   On: 06/14/2024 21:38     Signature  -   Lavada Stank M.D on 06-27-24 at 8:32 AM   -  To page go to [email or web address]

## 2024-06-28 NOTE — Death Summary Note (Signed)
 Death summary note  Joseph Tanner, is a 73 y.o. male, DOB - [birth year], FMW:[id number]  Admit date - 06/19/2024   Admitting Physician Terry LOISE Hurst, DO  Outpatient Primary MD for the patient is Pcp, No  LOS - 1  Chief Complaint  Patient presents with   Code Sepsis       Notification: Pcp, No notified of death of 06-21-24   Admit Date:  06/19/2024  Date of Death:  2024-06-21  Time of Death:  11:17 AM  Length of Stay: 1    Date and Time of Death -   Pronounced by -   History of present illness:   Joseph Tanner is a 73 y.o. male with a history of -  73 y.o. 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  The patient lives alone.  His family last saw him on 06/09/2024 and when they visited today he was noted to be altered while sitting on the couch.  Per his ex-wife via phone, the patient has been followed by hospice care.  For the past few days he has not allowed hospice RN into his house.  EMS was activated.  Upon EMS arrival, the patient was noted to be hypotensive and hypothermic.  Code sepsis was called.  Patient was comfortable this morning however around 11:15 AM he appeared slightly short of breath, subsequently small amount of blood was noted in his mouth and he passed away.  Was pronounced dead by the nursing staff on June 21, 2024 at 11:17 AM.   Final Diagnoses:  Cause of death - aspiration pneumonia, sepsis, possible hematemesis  Signature  -    Lavada Stank M.D on Jun 21, 2024 at 11:32 AM   -  To page go to [email or web address]   Total clinical and  documentation time for today Under 30 minutes   Last Note  PROGRESS NOTE     Patient Demographics:    Joseph Tanner, is a 73 y.o. male, DOB - 03/18/1951, FMW:[id number]  Outpatient Primary MD for the patient is Pcp, No    LOS - 1  Admit date - 06/14/2024    Chief Complaint  Patient presents with   Code Sepsis       Brief Narrative (HPI from H&P)    73 y.o. 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  The patient lives alone.  His family last saw him on 06/09/2024 and when they visited today he was noted to be altered while sitting on the couch.  Per his ex-wife via phone, the patient has been followed by hospice care.  For the past few days he has not allowed hospice RN into his house.  EMS was activated.  Upon EMS arrival, the patient was noted to be hypotensive and hypothermic.  Code sepsis was called.   He was diagnosed with pneumonia, sepsis, admitted to the hospital for further care.   Subjective:    [name] today in bed extremely frail and debilitated, appears to be in no distress.   Assessment  & Plan :   Severe sepsis due to pneumonia in a patient who is under hospice care at home, I will likely has aspirated.  He was sent to the hospital per hospice RN request, had long discussion with patient's daughter who is the primary decision maker, wants to continue gentle medical care which will include IV fluids, antibiotics, will involve palliative care for goals of care, eventually he will be best served with residential hospice in my opinion but will  let palliative care decide.  Most of his care will be directed towards keeping him comfortable, for now antibiotics and IV fluids will be done but no further escalation.  Hypotension, hypothermia, AKI, shock liver, metabolic acidosis and hyperkalemia.  All due to severe sepsis and endorgan damage, p.o. intake unreliable high aspiration risk, bicarb drip, gentle medical treatment as above, goal of care directed towards comfort.  Chronic anemia with some acute drop due to heme dilution from IV fluids.  Per family wishes 1 unit of packed RBC on 07-08-24.    Chronic systolic and diastolic CHF, EF 74%.  Supportive care.  Currently extremely dehydrated and septic.  Blood pressure too low to tolerate beta-blocker, ACE/ARB or Entresto .  Again goal of care is gentle medical treatment directed towards keeping him comfortable.      Condition - Extremely Guarded  Family Communication  : Discussed with patient's primary decision maker patient's daughter on 08-Jul-2024.  Code Status : DNR  Consults  : Palliative care  PUD Prophylaxis :    Procedures  :     CT chest abdomen pelvis.  CT head and C-spine.  Nonacute.      Disposition Plan  :    Status is: Inpatient   DVT Prophylaxis  :    heparin  injection 5,000 Units Start: 06/15/24 0600    Lab Results  Component Value Date   PLT 70 (L) 2024-07-08    Diet :  Diet Order             Diet NPO time specified Except for: Sips with Meds  Diet effective now  Inpatient Medications  Scheduled Meds:  sodium chloride    Intravenous Once   Chlorhexidine  Gluconate Cloth  6 each Topical Daily   heparin   5,000 Units Subcutaneous Q8H   midodrine   2.5 mg Oral TID WC   sodium zirconium cyclosilicate   10 g Oral TID   Continuous Infusions:  ampicillin -sulbactam (UNASYN ) IV 3 g (2024/07/11 0937)   sodium bicarbonate  150 mEq in dextrose  5 % 1,150 mL infusion     PRN Meds:.acetaminophen , dextrose , glycopyrrolate , melatonin,  polyethylene glycol, prochlorperazine     Objective:   Vitals:   07-11-24 0300 07-11-24 0400 07-11-24 0500 07-11-2024 0821  BP: (!) 80/59 (!) 76/54 (!) 76/56 (!) 82/53  Pulse: 86 82 82 78  Resp: (!) 37 19 (!) 43 (!) 38  Temp: (!) 97.4 F (36.3 C) 97.6 F (36.4 C)  97.6 F (36.4 C)  TempSrc: Axillary Axillary  Axillary  SpO2: 98% 94% 98% 95%  Weight:      Height:        Wt Readings from Last 3 Encounters:  06/14/24 54.4 kg  04/06/24 77.1 kg  04/02/24 60.8 kg     Intake/Output Summary (Last 24 hours) at 2024/07/11 1132 Last data filed at 06/15/2024 1803 Gross per 24 hour  Intake 483.47 ml  Output --  Net 483.47 ml     Physical Exam  Awake but appears extremely weak and frail, denies any headache or chest pain [place].AT,PERRAL Supple Neck, No JVD,   Symmetrical Chest wall movement, Good air movement bilaterally, CTAB RRR,No Gallops,Rubs or new Murmurs,  +ve B.Sounds, Abd Soft, No tenderness,   No Cyanosis, Clubbing or edema       Data Review:    Recent Labs  Lab 06/14/24 2045 06/14/24 2103 06/14/24 2330 06/15/24 0430 07-11-2024 0257  WBC 14.3*  --   --  15.0* 10.8*  HGB 8.7* 10.9* 9.2* 7.8* 6.8*  HCT 29.4* 32.0* 27.0* 26.5* 22.5*  PLT 97*  --   --  90* 70*  MCV 64.3*  --   --  63.9* 62.3*  MCH 19.0*  --   --  18.8* 18.8*  MCHC 29.6*  --   --  29.4* 30.2  RDW 25.9*  --   --  25.7* 25.1*  LYMPHSABS 0.3*  --   --  0.3*  --   MONOABS 0.6  --   --  1.0  --   EOSABS 0.0  --   --  0.0  --   BASOSABS 0.0  --   --  0.0  --     Recent Labs  Lab 06/14/24 2045 06/14/24 2103 06/14/24 2104 06/14/24 2330 06/15/24 0044 06/15/24 0244 06/15/24 0430 06/15/24 0647 06/15/24 0846 06/15/24 0932 2024-07-11 0257  NA 143 143  --  143  --   --  143  --   --   --  142  K 6.6* 6.7*  --  5.6*  --   --  5.2*  --   --   --  6.0*  CL 111  --   --  114*  --   --  112*  --   --   --  107  CO2 17*  --   --   --   --   --  13*  --   --   --  13*  ANIONGAP 15  --   --   --   --    --  18*  --   --   --  22*  GLUCOSE 52*  --   --  122*  --   --  41*  --   --   --  87  BUN 87*  --   --  75*  --   --  82*  --   --   --  91*  CREATININE 3.42*  --   --  3.30*  --   --  3.48*  --   --   --  4.44*  AST 242*  --   --   --   --   --  216*  --   --   --  174*  ALT 86*  --   --   --   --   --  78*  --   --   --  63*  ALKPHOS 54  --   --   --   --   --  50  --   --   --  35*  BILITOT 6.2*  --   --   --   --   --  6.2*  --   --   --  6.7*  ALBUMIN  2.4*  --   --   --   --   --  2.1*  --   --   --  3.2*  LATICACIDVEN  --   --    < >  --    < > 7.5* 6.4*  --  4.9* 5.8* 6.4*  INR 2.0*  --   --   --   --   --   --   --   --   --   --   TSH 1.101  --   --   --   --   --   --   --   --   --   --   AMMONIA  --   --   --   --   --   --   --  19  --   --   --   BNP 777.5*  --   --   --   --   --   --   --   --   --   --   MG  --   --   --   --   --   --  1.8  --   --   --   --   PHOS  --   --   --   --   --   --  5.1*  --   --   --   --   CALCIUM  8.4*  --   --   --   --   --  8.2*  --   --   --  7.9*   < > = values in this interval not displayed.      Recent Labs  Lab 06/14/24 2045 06/14/24 2104 06/15/24 0244 06/15/24 0430 06/15/24 0647 06/15/24 0846 06/15/24 0932 July 06, 2024 0257  LATICACIDVEN  --    < > 7.5* 6.4*  --  4.9* 5.8* 6.4*  INR 2.0*  --   --   --   --   --   --   --   TSH 1.101  --   --   --   --   --   --   --   AMMONIA  --   --   --   --  19  --   --   --  BNP 777.5*  --   --   --   --   --   --   --   MG  --   --   --  1.8  --   --   --   --   CALCIUM  8.4*  --   --  8.2*  --   --   --  7.9*   < > = values in this interval not displayed.    --------------------------------------------------------------------------------------------------------------- Lab Results  Component Value Date   CHOL 123 11/23/2020   HDL 39 (L) 11/23/2020   LDLCALC 56 11/23/2020   TRIG 138 11/23/2020   CHOLHDL 3.2 11/23/2020    No results found for: HGBA1C Recent Labs     06/14/24 2045  TSH 1.101   No results for input(s): VITAMINB12, FOLATE, FERRITIN, TIBC, IRON, RETICCTPCT in the last 72 hours. ------------------------------------------------------------------------------------------------------------------ Cardiac Enzymes No results for input(s): CKMB, TROPONINI, MYOGLOBIN in the last 168 hours.  Invalid input(s): CK  Micro Results Recent Results (from the past 240 hours)  Blood Culture (routine x 2)     Status: None (Preliminary result)   Collection Time: 06/14/24  8:40 PM   Specimen: BLOOD RIGHT ARM  Result Value Ref Range Status   Specimen Description BLOOD RIGHT ARM  Final   Special Requests   Final    BOTTLES DRAWN AEROBIC AND ANAEROBIC Blood Culture results may not be optimal due to an inadequate volume of blood received in culture bottles   Culture   Final    NO GROWTH 2 DAYS Performed at [facility], [address], [place], [place] [address]    Report Status PENDING  Incomplete  Resp panel by RT-PCR (RSV, Flu A&B, Covid) Anterior Nasal Swab     Status: None   Collection Time: 06/14/24  8:43 PM   Specimen: Anterior Nasal Swab  Result Value Ref Range Status   SARS Coronavirus 2 by RT PCR NEGATIVE NEGATIVE Final   Influenza A by PCR NEGATIVE NEGATIVE Final   Influenza B by PCR NEGATIVE NEGATIVE Final    Comment: (NOTE) The Xpert Xpress SARS-CoV-2/FLU/RSV plus assay is intended as an aid in the diagnosis of influenza from Nasopharyngeal swab specimens and should not be used as a sole basis for treatment. Nasal washings and aspirates are unacceptable for Xpert Xpress SARS-CoV-2/FLU/RSV testing.  Fact Sheet for Patients: bloggercourse.com  Fact Sheet for Healthcare Providers: seriousbroker.it  This test is not yet approved or cleared by the [place]  FDA and has been authorized for detection and/or diagnosis of SARS-CoV-2 by FDA under an Emergency Use  Authorization (EUA). This EUA will remain in effect (meaning this test can be used) for the duration of the COVID-19 declaration under Section 564(b)(1) of the Act, 21 U.S.C. section 360bbb-3(b)(1), unless the authorization is terminated or revoked.     Resp Syncytial Virus by PCR NEGATIVE NEGATIVE Final    Comment: (NOTE) Fact Sheet for Patients: bloggercourse.com  Fact Sheet for Healthcare Providers: seriousbroker.it  This test is not yet approved or cleared by the [place]  FDA and has been authorized for detection and/or diagnosis of SARS-CoV-2 by FDA under an Emergency Use Authorization (EUA). This EUA will remain in effect (meaning this test can be used) for the duration of the COVID-19 declaration under Section 564(b)(1) of the Act, 21 U.S.C. section 360bbb-3(b)(1), unless the authorization is terminated or revoked.  Performed at [facility], [address], [place], [place] [address]   Urine Culture  Status: None (Preliminary result)   Collection Time: 06/14/24  8:54 PM   Specimen: Urine, Random  Result Value Ref Range Status   Specimen Description URINE, RANDOM  Final   Special Requests NONE Reflexed from [id number]  Final   Culture   Final    CULTURE REINCUBATED FOR BETTER GROWTH Performed at [facility], [address], [place], [place] [address]    Report Status PENDING  Incomplete    Radiology Report CT ABDOMEN PELVIS WO CONTRAST Result Date: 06/15/2024 EXAM: CT ABDOMEN AND PELVIS WITHOUT CONTRAST 06/15/2024 12:20:24 AM TECHNIQUE: CT of the abdomen and pelvis was performed without the administration of intravenous contrast. Multiplanar reformatted images are provided for review. Automated exposure control, iterative reconstruction, and/or weight-based adjustment of the mA/kV was utilized to reduce the radiation dose to as low as reasonably achievable. COMPARISON: CT chest 02/10/2024 CLINICAL  HISTORY: abd pain FINDINGS: LOWER CHEST: Bilateral trace pleural effusions, left greater than right. Bibasilar atelectasis. Cardiomegaly. Coronary artery calcification. Cardiac findings suggestive of anemia. LIVER: The liver is unremarkable. GALLBLADDER AND BILE DUCTS: Gallbladder is unremarkable. No biliary ductal dilatation. SPLEEN: No acute abnormality. PANCREAS: No acute abnormality. ADRENAL GLANDS: No acute abnormality. KIDNEYS, URETERS AND BLADDER: Right nephrolithiasis. Left nephrolithiasis measuring up to 2.4 cm. No associated hydroureteronephrosis. No ureterolithiasis. No perinephric or periureteral stranding. Urinary bladder decompressed with foley catheter tip and balloon terminating within the lumen. GI AND BOWEL: Stomach demonstrates no acute abnormality. No definite small or large bowel thickening. Colonic diverticulosis. No pneumatosis. There is no bowel obstruction. APPENDIX: The appendix is unremarkable. PERITONEUM AND RETROPERITONEUM: Trace simple free fluid consistent with ascites. No free air. VASCULATURE: Severe atherosclerotic plaque of the abdominal aorta and its main branches. LYMPH NODES: No lymphadenopathy. REPRODUCTIVE ORGANS: Prostate is unremarkable. BONES AND SOFT TISSUES: LUMBAR spine severe degenerative changes. Diffusely decreased bone density. No focal soft tissue abnormality. Limited evaluation on this noncontrast study due to motion artifact and streak artifact. IMPRESSION: 1. No acute findings in the abdomen or pelvis on this noncontrast study, acknowledging limitations from motion and streak artifact. 2. Nonobstructive bilateral nephrolithiasis measuring up to 2.4 cm on the left, punctate on the right. 3. Colonic diverticulosis with no acute diverticulitis. 4. Bilateral trace pleural effusions, left greater than right. 5. Cardiomegaly. 6. Severe atherosclerotic plaque of the abdominal aorta and its main branches. 7. Other, non-acute and/or normal findings as above.  Electronically signed by: Morgane Naveau MD 06/15/2024 01:46 AM EST RP Workstation: [id number]   CT Head Wo Contrast Addendum Date: 06/15/2024  ADDENDUM: Consider repeat CT head given motion artifact . ---------------------------------------------------- Electronically signed by: Morgane Naveau MD 06/15/2024 01:38 AM EST RP Workstation: [id number]   Result Date: 06/15/2024    EXAM: CT HEAD WITHOUT CONTRAST 06/15/2024 12:20:24 AM TECHNIQUE: CT of the head was performed without the administration of intravenous contrast. Automated exposure control, iterative reconstruction, and/or weight based adjustment of the mA/kV was utilized to reduce the radiation dose to as low as reasonably achievable. COMPARISON: None available. CLINICAL HISTORY: unknown fall, sig delirium FINDINGS: BRAIN AND VENTRICLES: No acute hemorrhage. No evidence of acute infarct. Cerebral ventricle sizes are concordant with the degree of cerebral volume loss. Patchy and confluent areas of decreased attenuation are noted throughout the deep and periventricular white matter of the cerebral hemispheres bilaterally suggestive of chronic microvascular ischemic changes. Left parietooccipital encephalomalacia. Atherosclerotic calcifications are present within the cavernous internal carotid arteries. No hydrocephalus. No extra-axial collection. No mass effect or midline shift. ORBITS: No acute abnormality. SINUSES:  No acute abnormality. SOFT TISSUES AND SKULL: No acute soft tissue abnormality. No skull fracture. IMPRESSION: 1. No acute intracranial abnormality. Electronically signed by: Morgane Naveau MD 06/15/2024 12:59 AM EST RP Workstation: [id number]   CT Cervical Spine Wo Contrast Result Date: 06/15/2024 EXAM: CT CERVICAL SPINE WITHOUT CONTRAST 06/15/2024 12:20:24 AM TECHNIQUE: CT of the cervical spine was performed without the administration of intravenous contrast. Multiplanar reformatted images are provided for review. Automated exposure  control, iterative reconstruction, and/or weight based adjustment of the mA/kV was utilized to reduce the radiation dose to as low as reasonably achievable. COMPARISON: None available. CLINICAL HISTORY: fall w/ delirium FINDINGS: CERVICAL SPINE: BONES AND ALIGNMENT: Grade 1 anterolisthesis of C3 on C4. Mild retrolisthesis of C4 on C5 and C5 on C6. No acute fracture. DEGENERATIVE CHANGES: Multilevel severe degenerative change of the spine. No associated severe osseous neural foraminal stenosis. SOFT TISSUES: No prevertebral soft tissue swelling. VASCULATURE: Atherosclerotic plaque of the carotid arteries within the neck. IMPRESSION: 1. No acute abnormality of the cervical spine. Electronically signed by: Morgane Naveau MD 06/15/2024 12:49 AM EST RP Workstation: [id number]   DG Chest Port 1 View Result Date: 06/14/2024 CLINICAL DATA:  Questionable sepsis EXAM: PORTABLE CHEST 1 VIEW COMPARISON:  Chest x-ray 04/06/2024 FINDINGS: There is new patchy airspace disease in the left mid lung. The left lung base has been excluded. The heart is enlarged, unchanged. No pneumothorax or acute fracture visualized. IMPRESSION: New patchy airspace disease in the left mid lung concerning for pneumonia. Follow-up PA and lateral chest x-ray recommended in 4-6 weeks to confirm resolution. Electronically Signed   By: Greig Pique M.D.   On: 06/14/2024 21:38     Signature  -   Lavada Stank M.D on 07-04-24 at 11:32 AM   -  To page go to [email or web address]

## 2024-06-28 NOTE — Evaluation (Signed)
 Clinical/Bedside Swallow Evaluation Patient Details  Name: Joseph Tanner MRN: [id number] Date of Birth: [birth year]  Today's Date: 06/19/24 Time: SLP Start Time (ACUTE ONLY): 1017 SLP Stop Time (ACUTE ONLY): 1036 SLP Time Calculation (min) (ACUTE ONLY): 19 min  Past Medical History:  Past Medical History:  Diagnosis Date   COPD (chronic obstructive pulmonary disease) (HCC)    Hyperlipemia    Hypertension    NSTEMI (non-ST elevated myocardial infarction) (HCC)    Pure hypercholesterolemia 03/15/2021   Past Surgical History: History reviewed. No pertinent surgical history. HPI:  73 yo male presenting  from home under hospice care to ED 11/17 with AMS. Admitted with sepsis secondary to pneumonia with hypotension, hypothermia, and AKI. PMH: chronic HFrEF with LVEF 20-25%, CKD 4, BPH, HLD, CAD, prior NSTEMI    Assessment / Plan / Recommendation  Clinical Impression  Pt is alert but does not follow commands. He actively accepts boluses and transits them through his oral cavity efficiently. He swallows multiple times followed by hydrophonia and coughing. Suspect tachypnea may also be impacting his coordination of breathing and swallowing (RR 30-50). Pt's wife reports pt has primarily consumed Ensures since most recent admission in September. Discussed that he is at risk for inadequate hydration and nutrition and is now also showing clinical signs concerning for aspiration. For now pending clarification of GOC, recommend NPO status be maintained.  While in the process of documenting this session, noted MD entered death summary and therefore, will not set goals for f/u.  SLP Visit Diagnosis: Dysphagia, unspecified (R13.10)    Aspiration Risk  Moderate aspiration risk;Risk for inadequate nutrition/hydration    Diet Recommendation NPO    Medication Administration: Via alternative means    Other  Recommendations Oral Care Recommendations: Oral care QID     Assistance Recommended  at Discharge    Functional Status Assessment Patient has had a recent decline in their functional status and demonstrates the ability to make significant improvements in function in a reasonable and predictable amount of time.  Frequency and Duration            Prognosis        Swallow Study   General HPI: 73 yo male presenting  from home under hospice care to ED 11/17 with AMS. Admitted with sepsis secondary to pneumonia with hypotension, hypothermia, and AKI. PMH: chronic HFrEF with LVEF 20-25%, CKD 4, BPH, HLD, CAD, prior NSTEMI Type of Study: Bedside Swallow Evaluation Previous Swallow Assessment: none in chart Diet Prior to this Study: NPO Temperature Spikes Noted: No Respiratory Status: Nasal cannula History of Recent Intubation: No Behavior/Cognition: Alert Oral Cavity Assessment: Dry;Dried secretions Oral Care Completed by SLP: No Oral Cavity - Dentition: Adequate natural dentition Vision: Functional for self-feeding Self-Feeding Abilities: Total assist Patient Positioning: Upright in bed Baseline Vocal Quality: Not observed Volitional Cough: Cognitively unable to elicit Volitional Swallow: Unable to elicit    Oral/Motor/Sensory Function Overall Oral Motor/Sensory Function: Within functional limits   Ice Chips Ice chips: Impaired Presentation: Spoon Pharyngeal Phase Impairments: Multiple swallows;Wet Vocal Quality;Cough - Immediate   Thin Liquid Thin Liquid: Impaired Presentation: Straw;Spoon Pharyngeal  Phase Impairments: Multiple swallows;Wet Vocal Quality;Cough - Immediate    Nectar Thick Nectar Thick Liquid: Not tested   Honey Thick Honey Thick Liquid: Not tested   Puree Puree: Impaired Presentation: Spoon Pharyngeal Phase Impairments: Multiple swallows   Solid     Solid: Not tested      Damien Blumenthal, M.A., CCC-SLP Speech Language Pathology, Acute Rehabilitation Services  Secure Chat preferred [phone]  2024/06/25,11:42 AM

## 2024-06-28 DEATH — deceased
